# Patient Record
Sex: Male | Born: 1955 | Race: White | Hispanic: No | Marital: Single | Smoking: Former smoker
Health system: Southern US, Community
[De-identification: ages and names within clinical notes are randomized; demographics above are authoritative.]

## PROBLEM LIST (undated history)

## (undated) DIAGNOSIS — J9611 Chronic respiratory failure with hypoxia: Secondary | ICD-10-CM

## (undated) DIAGNOSIS — J449 Chronic obstructive pulmonary disease, unspecified: Secondary | ICD-10-CM

## (undated) DIAGNOSIS — C801 Malignant (primary) neoplasm, unspecified: Secondary | ICD-10-CM

## (undated) DIAGNOSIS — F411 Generalized anxiety disorder: Secondary | ICD-10-CM

## (undated) SURGERY — Surgical Case
Anesthesia: *Unknown

---

## 2007-07-30 ENCOUNTER — Emergency Department: Payer: Self-pay | Admitting: Emergency Medicine

## 2008-02-02 ENCOUNTER — Emergency Department: Payer: Self-pay | Admitting: Emergency Medicine

## 2008-07-27 ENCOUNTER — Encounter: Payer: Self-pay | Admitting: General Practice

## 2008-08-25 ENCOUNTER — Encounter: Payer: Self-pay | Admitting: General Practice

## 2008-10-26 ENCOUNTER — Ambulatory Visit: Payer: Self-pay | Admitting: Family Medicine

## 2010-09-01 ENCOUNTER — Emergency Department: Payer: Self-pay

## 2013-06-04 ENCOUNTER — Emergency Department: Payer: Self-pay | Admitting: Emergency Medicine

## 2021-05-14 ENCOUNTER — Inpatient Hospital Stay
Admission: EM | Admit: 2021-05-14 | Discharge: 2021-05-22 | DRG: 987 | Disposition: A | Discharge status: Home / Self Care | Payer: Non-veteran care | Attending: Internal Medicine | Admitting: Internal Medicine

## 2021-05-14 ENCOUNTER — Emergency Department: Payer: Non-veteran care

## 2021-05-14 ENCOUNTER — Other Ambulatory Visit: Payer: Self-pay

## 2021-05-14 DIAGNOSIS — C771 Secondary and unspecified malignant neoplasm of intrathoracic lymph nodes: Secondary | ICD-10-CM | POA: Diagnosis present

## 2021-05-14 DIAGNOSIS — C3412 Malignant neoplasm of upper lobe, left bronchus or lung: Principal | ICD-10-CM | POA: Diagnosis present

## 2021-05-14 DIAGNOSIS — Z20822 Contact with and (suspected) exposure to covid-19: Secondary | ICD-10-CM | POA: Diagnosis present

## 2021-05-14 DIAGNOSIS — R042 Hemoptysis: Secondary | ICD-10-CM

## 2021-05-14 DIAGNOSIS — E876 Hypokalemia: Secondary | ICD-10-CM | POA: Diagnosis not present

## 2021-05-14 DIAGNOSIS — R0602 Shortness of breath: Secondary | ICD-10-CM | POA: Diagnosis not present

## 2021-05-14 DIAGNOSIS — Z23 Encounter for immunization: Secondary | ICD-10-CM

## 2021-05-14 DIAGNOSIS — Z87891 Personal history of nicotine dependence: Secondary | ICD-10-CM

## 2021-05-14 DIAGNOSIS — F17200 Nicotine dependence, unspecified, uncomplicated: Secondary | ICD-10-CM

## 2021-05-14 DIAGNOSIS — C787 Secondary malignant neoplasm of liver and intrahepatic bile duct: Secondary | ICD-10-CM | POA: Diagnosis present

## 2021-05-14 DIAGNOSIS — J440 Chronic obstructive pulmonary disease with acute lower respiratory infection: Secondary | ICD-10-CM | POA: Diagnosis present

## 2021-05-14 DIAGNOSIS — R918 Other nonspecific abnormal finding of lung field: Secondary | ICD-10-CM

## 2021-05-14 DIAGNOSIS — R160 Hepatomegaly, not elsewhere classified: Secondary | ICD-10-CM

## 2021-05-14 DIAGNOSIS — C3490 Malignant neoplasm of unspecified part of unspecified bronchus or lung: Secondary | ICD-10-CM

## 2021-05-14 DIAGNOSIS — J9601 Acute respiratory failure with hypoxia: Secondary | ICD-10-CM

## 2021-05-14 DIAGNOSIS — D860 Sarcoidosis of lung: Secondary | ICD-10-CM | POA: Diagnosis present

## 2021-05-14 DIAGNOSIS — J189 Pneumonia, unspecified organism: Secondary | ICD-10-CM

## 2021-05-14 DIAGNOSIS — R591 Generalized enlarged lymph nodes: Secondary | ICD-10-CM

## 2021-05-14 LAB — CBC WITH DIFFERENTIAL/PLATELET
Abs Immature Granulocytes: 0.03 10*3/uL (ref 0.00–0.07)
Basophils Absolute: 0 10*3/uL (ref 0.0–0.1)
Basophils Relative: 0 %
Eosinophils Absolute: 0.1 10*3/uL (ref 0.0–0.5)
Eosinophils Relative: 1 %
HCT: 42.6 % (ref 39.0–52.0)
Hemoglobin: 14.4 g/dL (ref 13.0–17.0)
Immature Granulocytes: 0 %
Lymphocytes Relative: 9 %
Lymphs Abs: 0.9 10*3/uL (ref 0.7–4.0)
MCH: 28.9 pg (ref 26.0–34.0)
MCHC: 33.8 g/dL (ref 30.0–36.0)
MCV: 85.4 fL (ref 80.0–100.0)
Monocytes Absolute: 1 10*3/uL (ref 0.1–1.0)
Monocytes Relative: 9 %
Neutro Abs: 8.4 10*3/uL — ABNORMAL HIGH (ref 1.7–7.7)
Neutrophils Relative %: 81 %
Platelets: 185 10*3/uL (ref 150–400)
RBC: 4.99 MIL/uL (ref 4.22–5.81)
RDW: 12.7 % (ref 11.5–15.5)
WBC: 10.5 10*3/uL (ref 4.0–10.5)
nRBC: 0 % (ref 0.0–0.2)

## 2021-05-14 MED ORDER — SODIUM CHLORIDE 0.9 % IV BOLUS
500.0000 mL | Freq: Once | INTRAVENOUS | Status: AC
  Administered 2021-05-15: 500 mL via INTRAVENOUS

## 2021-05-14 MED ORDER — HYDROCOD POLST-CPM POLST ER 10-8 MG/5ML PO SUER
5.0000 mL | Freq: Once | ORAL | Status: AC
  Administered 2021-05-14: 5 mL via ORAL
  Filled 2021-05-14: qty 5

## 2021-05-14 MED ORDER — SODIUM CHLORIDE 0.9 % IV BOLUS: 1000.0000 mL | Freq: Once | INTRAVENOUS | Status: DC

## 2021-05-14 MED ORDER — SODIUM CHLORIDE 0.9 % IV SOLN
500.0000 mg | Freq: Once | INTRAVENOUS | Status: AC
  Administered 2021-05-15: 500 mg via INTRAVENOUS
  Filled 2021-05-14: qty 500

## 2021-05-14 MED ORDER — IPRATROPIUM-ALBUTEROL 0.5-2.5 (3) MG/3ML IN SOLN
3.0000 mL | Freq: Once | RESPIRATORY_TRACT | Status: AC
  Administered 2021-05-14: 3 mL via RESPIRATORY_TRACT
  Filled 2021-05-14: qty 3

## 2021-05-14 MED ORDER — METHYLPREDNISOLONE SODIUM SUCC 125 MG IJ SOLR
125.0000 mg | Freq: Once | INTRAMUSCULAR | Status: AC
  Administered 2021-05-15: 125 mg via INTRAVENOUS
  Filled 2021-05-14: qty 2

## 2021-05-14 MED ORDER — SODIUM CHLORIDE 0.9 % IV SOLN
1.0000 g | Freq: Once | INTRAVENOUS | Status: AC
  Administered 2021-05-15: 1 g via INTRAVENOUS
  Filled 2021-05-14: qty 10

## 2021-05-14 NOTE — ED Notes (Signed)
Patient placed on 02 at 2 liters via nasal cannula.

## 2021-05-14 NOTE — ED Provider Notes (Signed)
[facility] Emergency Department Provider Note   ____________________________________________   Event Date/Time   First MD Initiated Contact with Patient 05/14/21 2304     (approximate)  I have reviewed the triage vital signs and the nursing notes.   HISTORY  Chief Complaint Shortness of Breath    HPI Derek Clarke is a 65 y.o. male who presents to the ED from home with a chief complaint of cough and shortness of breath.  Symptoms x2 months, worsening over the past several days and acutely worse tonight.  Endorses hemoptysis.  Denies medical history, does not take prescription medications. + Smoker.  Denies fever, chest pain, abdominal pain, nausea, vomiting or dizziness.  Denies recent travel or trauma.     Past medical history None  There are no problems to display for this patient.   Prior to Admission medications   Not on File    Allergies Patient has no known allergies.  No family history on file.  Social History    Review of Systems  Constitutional: No fever/chills Eyes: No visual changes. ENT: No sore throat. Cardiovascular: Denies chest pain. Respiratory: Positive for cough, hemoptysis and shortness of breath. Gastrointestinal: No abdominal pain.  No nausea, no vomiting.  No diarrhea.  No constipation. Genitourinary: Negative for dysuria. Musculoskeletal: Negative for back pain. Skin: Negative for rash. Neurological: Negative for headaches, focal weakness or numbness.   ____________________________________________   PHYSICAL EXAM:  VITAL SIGNS: ED Triage Vitals  Enc Vitals Group     BP 05/14/21 2247 (!) 156/91     Pulse Rate 05/14/21 2247 100     Resp 05/14/21 2247 (!) 22     Temp 05/14/21 2247 98.7 F (37.1 C)     Temp Source 05/14/21 2247 Oral     SpO2 05/14/21 2247 90 %     Weight 05/14/21 2243 225 lb (102.1 kg)     Height 05/14/21 2243 5\' 9"  (1.753 m)     Head Circumference --      Peak Flow --      Pain  Score --      Pain Loc --      Pain Edu? --      Excl. in [place]? --     Constitutional: Alert and oriented.  Ill appearing and in moderate acute distress. Eyes: Conjunctivae are normal. PERRL. EOMI. Head: Atraumatic. Nose: No congestion/rhinnorhea. Mouth/Throat: Mucous membranes are mildly dry. Neck: No stridor.   Cardiovascular: Normal rate, regular rhythm. Grossly normal heart sounds.  Good peripheral circulation. Respiratory: Increased respiratory effort.  Mild retractions.  Tripoding.  Lungs diminished with scattered wheezing and rhonchi. Gastrointestinal: Soft and nontender to light or deep palpation. No distention. No abdominal bruits. No CVA tenderness. Musculoskeletal: No lower extremity tenderness nor edema.  No joint effusions. Neurologic:  Normal speech and language. No gross focal neurologic deficits are appreciated.  Skin:  Skin is warm, dry and intact. No rash noted. Psychiatric: Mood and affect are normal. Speech and behavior are normal.  ____________________________________________   LABS (all labs ordered are listed, but only abnormal results are displayed)  Labs Reviewed  CBC WITH DIFFERENTIAL/PLATELET - Abnormal; Notable for the following components:      Result Value   Neutro Abs 8.4 (*)    All other components within normal limits  COMPREHENSIVE METABOLIC PANEL - Abnormal; Notable for the following components:   Potassium 3.4 (*)    Glucose, Bld 108 (*)    Creatinine, Ser 0.58 (*)  All other components within normal limits  BLOOD GAS, ARTERIAL - Abnormal; Notable for the following components:   pH, Arterial 7.51 (*)    pO2, Arterial 72 (*)    Bicarbonate 31.9 (*)    Acid-Base Excess 8.1 (*)    All other components within normal limits  RESP PANEL BY RT-PCR (FLU A&B, COVID) [id number]  CULTURE, BLOOD (ROUTINE X 2)  CULTURE, BLOOD (ROUTINE X 2)  LACTIC ACID, PLASMA  PROCALCITONIN  TROPONIN I (HIGH SENSITIVITY)  TROPONIN I (HIGH SENSITIVITY)    ____________________________________________  EKG  ED ECG REPORT I, Fernanda Twaddell J, the attending physician, personally viewed and interpreted this ECG.   Date: 05/14/2021  EKG Time: 2301  Rate: 91  Rhythm: normal sinus rhythm  Axis: Normal  Intervals:right bundle branch block  ST&T Change: Nonspecific  ____________________________________________  RADIOLOGY Cecilie Kicks J, personally viewed and evaluated these images (plain radiographs) as part of my medical decision making, as well as reviewing the written report by the radiologist.  ED MD interpretation:  bilateral perihilar infiltrates  Official radiology report(s): DG Chest 1 View  Result Date: 05/14/2021 CLINICAL DATA:  Shortness of breath for couple of months. EXAM: CHEST  1 VIEW COMPARISON:  None. FINDINGS: Mild cardiac enlargement. Perihilar infiltrates bilaterally may represent edema or multifocal pneumonia. No pleural effusions. No pneumothorax. Mediastinal contours appear intact. IMPRESSION: Bilateral perihilar infiltrates. Electronically Signed   By: Lucienne Capers M.D.   On: 05/14/2021 23:11   CT Angio Chest PE W/Cm &/Or Wo Cm  Result Date: 05/15/2021 CLINICAL DATA:  Hemoptysis EXAM: CT ANGIOGRAPHY CHEST WITH CONTRAST TECHNIQUE: Multidetector CT imaging of the chest was performed using the standard protocol during bolus administration of intravenous contrast. Multiplanar CT image reconstructions and MIPs were obtained to evaluate the vascular anatomy. CONTRAST:  136mL OMNIPAQUE IOHEXOL 350 MG/ML SOLN COMPARISON:  None. FINDINGS: Cardiovascular: Contrast injection is sufficient to demonstrate satisfactory opacification of the pulmonary arteries to the segmental level. There is no pulmonary embolus or evidence of right heart strain. The size of the main pulmonary artery is normal. Proximal left pulmonary arteries are severely narrowed by confluent soft tissue surrounding the left hilum. The course and caliber of the aorta  are normal. There is atherosclerotic calcification. Opacification decreased due to pulmonary arterial phase contrast bolus timing. There is a small pericardial effusion. Mediastinum/Nodes: There is bulky abnormal mediastinal soft tissue with in case mint of the left common carotid artery and proximal left subclavian artery. There are multiple enlarged mediastinal lymph nodes. Level 4 are node measures 10 mm. Lungs/Pleura: There is confluent soft tissue at the left hilum encasing the main left pulmonary artery and severely narrowing the proximal lobar branches. Abnormal soft tissue extends in a peribronchial pattern. There is also abnormal soft tissue at the right hilum. There is atelectasis within the right middle lobe and an area of consolidation in the lingula. Small pleural effusions. Upper Abdomen: Contrast bolus timing is not optimized for evaluation of the abdominal organs. There is an intermediate density 2.0 x 1.3 cm right adrenal nodule. There is a lower density left adrenal nodule measuring 1.5 cm. Musculoskeletal: No chest wall abnormality. No bony spinal canal stenosis. Review of the MIP images confirms the above findings. IMPRESSION: 1. No pulmonary embolus or acute aortic syndrome. 2. Bulky abnormal soft tissue at the left hilum encasing the main left pulmonary artery and severely narrowing the proximal lobar branches, consistent with malignancy. 3. Multiple enlarged mediastinal lymph nodes, consistent with lymphatic metastatic disease. 4. Bilateral adrenal  nodules, measuring up to 2.0 x 1.3 cm. The right adrenal nodule is concerning for metastatic disease. The left may be an adenoma. 5. Small pericardial effusion and small pleural effusions. 6. Consolidation in the lingula may be postobstructive. Aortic atherosclerosis (ICD10-I70.0). Electronically Signed   By: Ulyses Jarred M.D.   On: 05/15/2021 01:57    ____________________________________________   PROCEDURES  Procedure(s) performed  (including Critical Care):  .1-3 Lead EKG Interpretation  Date/Time: 05/14/2021 11:43 PM Performed by: Paulette Blanch, MD Authorized by: Paulette Blanch, MD     Interpretation: normal     ECG rate:  96   ECG rate assessment: normal     Rhythm: sinus rhythm     Ectopy: none     Conduction: normal   Comments:     Patient placed on cardiac monitor to evaluate for arrhythmias  CRITICAL CARE Performed by: Paulette Blanch   Total critical care time: 45 minutes  Critical care time was exclusive of separately billable procedures and treating other patients.  Critical care was necessary to treat or prevent imminent or life-threatening deterioration.  Critical care was time spent personally by me on the following activities: development of treatment plan with patient and/or surrogate as well as nursing, discussions with consultants, evaluation of patient's response to treatment, examination of patient, obtaining history from patient or surrogate, ordering and performing treatments and interventions, ordering and review of laboratory studies, ordering and review of radiographic studies, pulse oximetry and re-evaluation of patient's condition.    ____________________________________________   INITIAL IMPRESSION / ASSESSMENT AND PLAN / ED COURSE  As part of my medical decision making, I reviewed the following data within the [facility] notes reviewed and incorporated, Labs reviewed, EKG interpreted, Old chart reviewed, Radiograph reviewed, Discussed with admitting physician, and Notes from prior ED visits     65 year old male presenting with cough, hemoptysis and shortness of breath. Differential includes, but is not limited to, viral syndrome, bronchitis including COPD exacerbation, pneumonia, reactive airway disease including asthma, CHF including exacerbation with or without pulmonary/interstitial edema, pneumothorax, ACS, thoracic trauma, and pulmonary embolism.   Will  obtain cardiac panel, blood cultures, lactic acid, procalcitonin, COVID swab.  Chest x-ray demonstrates perihilar infiltrates.  Will initiate IV antibiotics for community-acquired pneumonia.  Will obtain CTA chest to evaluate for PE given complaint of hemoptysis.  Patient placed on 2 L nasal cannula oxygen for room air saturations of 90%; now 96%.  Given his tachypnea and retractions, will administer IV Solu-Medrol and DuoNeb.  Anticipate hospitalization.  Clinical Course as of 05/15/21 0236  Nancy Fetter May 15, 2021  0059 Patient feels like there is something stuck in his throat that he cannot cough up.  IV Rocephin completed.  There are no hives, no oropharyngeal swelling.  There is no muffled or hoarse voice.  Patient is coughing, feeling agitated, better on standing.  Will administer second DuoNeb, low-dose Benadryl. [JS]  0104 Patient is already feeling better with the initiation of the second DuoNeb. [JS]  [id number] Updated patient on CT imaging results.  Will discuss with hospitalist services for admission. [JS]    Clinical Course User Index [JS] Paulette Blanch, MD     ____________________________________________   FINAL CLINICAL IMPRESSION(S) / ED DIAGNOSES  Final diagnoses:  Community acquired pneumonia, unspecified laterality  Shortness of breath  Hemoptysis  Malignant neoplasm of lung, unspecified laterality, unspecified part of lung [facility])     ED Discharge Orders     None  Note:  This document was prepared using Dragon voice recognition software and may include unintentional dictation errors.    Paulette Blanch, MD 05/15/21 [phone]

## 2021-05-14 NOTE — ED Triage Notes (Signed)
Patient reports feeling short of breath for couple months, worse tonight.

## 2021-05-14 NOTE — ED Notes (Signed)
ED Provider at bedside. 

## 2021-05-15 ENCOUNTER — Inpatient Hospital Stay: Payer: Non-veteran care

## 2021-05-15 ENCOUNTER — Encounter: Payer: Self-pay | Admitting: Internal Medicine

## 2021-05-15 ENCOUNTER — Emergency Department: Payer: Non-veteran care

## 2021-05-15 DIAGNOSIS — R0602 Shortness of breath: Secondary | ICD-10-CM

## 2021-05-15 DIAGNOSIS — R042 Hemoptysis: Secondary | ICD-10-CM

## 2021-05-15 DIAGNOSIS — C787 Secondary malignant neoplasm of liver and intrahepatic bile duct: Secondary | ICD-10-CM | POA: Diagnosis present

## 2021-05-15 DIAGNOSIS — Z23 Encounter for immunization: Secondary | ICD-10-CM | POA: Diagnosis not present

## 2021-05-15 DIAGNOSIS — J440 Chronic obstructive pulmonary disease with acute lower respiratory infection: Secondary | ICD-10-CM | POA: Diagnosis present

## 2021-05-15 DIAGNOSIS — Z87891 Personal history of nicotine dependence: Secondary | ICD-10-CM | POA: Diagnosis not present

## 2021-05-15 DIAGNOSIS — F17200 Nicotine dependence, unspecified, uncomplicated: Secondary | ICD-10-CM

## 2021-05-15 DIAGNOSIS — C3490 Malignant neoplasm of unspecified part of unspecified bronchus or lung: Secondary | ICD-10-CM

## 2021-05-15 DIAGNOSIS — C771 Secondary and unspecified malignant neoplasm of intrathoracic lymph nodes: Secondary | ICD-10-CM | POA: Diagnosis present

## 2021-05-15 DIAGNOSIS — J9601 Acute respiratory failure with hypoxia: Secondary | ICD-10-CM | POA: Diagnosis present

## 2021-05-15 DIAGNOSIS — F17210 Nicotine dependence, cigarettes, uncomplicated: Secondary | ICD-10-CM

## 2021-05-15 DIAGNOSIS — D860 Sarcoidosis of lung: Secondary | ICD-10-CM | POA: Diagnosis present

## 2021-05-15 DIAGNOSIS — J189 Pneumonia, unspecified organism: Secondary | ICD-10-CM

## 2021-05-15 DIAGNOSIS — E876 Hypokalemia: Secondary | ICD-10-CM | POA: Diagnosis not present

## 2021-05-15 DIAGNOSIS — C3412 Malignant neoplasm of upper lobe, left bronchus or lung: Secondary | ICD-10-CM | POA: Diagnosis present

## 2021-05-15 DIAGNOSIS — R918 Other nonspecific abnormal finding of lung field: Secondary | ICD-10-CM

## 2021-05-15 DIAGNOSIS — Z20822 Contact with and (suspected) exposure to covid-19: Secondary | ICD-10-CM | POA: Diagnosis present

## 2021-05-15 LAB — COMPREHENSIVE METABOLIC PANEL
ALT: 33 U/L (ref 0–44)
AST: 30 U/L (ref 15–41)
Albumin: 3.7 g/dL (ref 3.5–5.0)
Alkaline Phosphatase: 100 U/L (ref 38–126)
Anion gap: 10 (ref 5–15)
BUN: 12 mg/dL (ref 8–23)
CO2: 29 mmol/L (ref 22–32)
Calcium: 9 mg/dL (ref 8.9–10.3)
Chloride: 99 mmol/L (ref 98–111)
Creatinine, Ser: 0.58 mg/dL — ABNORMAL LOW (ref 0.61–1.24)
GFR, Estimated: >60 mL/min (ref >60)
Glucose, Bld: 108 mg/dL — ABNORMAL HIGH (ref 70–99)
Potassium: 3.4 mmol/L — ABNORMAL LOW (ref 3.5–5.1)
Sodium: 138 mmol/L (ref 135–145)
Total Bilirubin: 1.1 mg/dL (ref 0.3–1.2)
Total Protein: 6.7 g/dL (ref 6.5–8.1)

## 2021-05-15 LAB — BLOOD GAS, ARTERIAL
Acid-Base Excess: 8.1 mmol/L — ABNORMAL HIGH (ref 0.0–2.0)
Bicarbonate: 31.9 mmol/L — ABNORMAL HIGH (ref 20.0–28.0)
FIO2: 32
O2 Saturation: 95.7 %
Patient temperature: 37
pCO2 arterial: 40 mmHg (ref 32.0–48.0)
pH, Arterial: 7.51 — ABNORMAL HIGH (ref 7.350–7.450)
pO2, Arterial: 72 mmHg — ABNORMAL LOW (ref 83.0–108.0)

## 2021-05-15 LAB — RESP PANEL BY RT-PCR (FLU A&B, COVID) ARPGX2
Influenza A by PCR: NEGATIVE
Influenza B by PCR: NEGATIVE
SARS Coronavirus 2 by RT PCR: NEGATIVE

## 2021-05-15 LAB — LACTIC ACID, PLASMA: Lactic Acid, Venous: 1.1 mmol/L (ref 0.5–1.9)

## 2021-05-15 LAB — HIV ANTIBODY (ROUTINE TESTING W REFLEX): HIV Screen 4th Generation wRfx: NONREACTIVE

## 2021-05-15 LAB — TROPONIN I (HIGH SENSITIVITY)
Troponin I (High Sensitivity): 8 ng/L (ref <18)
Troponin I (High Sensitivity): 8 ng/L (ref <18)

## 2021-05-15 LAB — PROCALCITONIN: Procalcitonin: <0.1 ng/mL

## 2021-05-15 MED ORDER — IPRATROPIUM-ALBUTEROL 0.5-2.5 (3) MG/3ML IN SOLN
RESPIRATORY_TRACT | Status: AC
  Filled 2021-05-15: qty 3

## 2021-05-15 MED ORDER — SODIUM CHLORIDE 0.9 % IV SOLN
1.0000 g | Freq: Once | INTRAVENOUS | Status: AC
  Administered 2021-05-15: 1 g via INTRAVENOUS
  Filled 2021-05-15: qty 10

## 2021-05-15 MED ORDER — METHYLPREDNISOLONE SODIUM SUCC 125 MG IJ SOLR
INTRAMUSCULAR | Status: AC
  Filled 2021-05-15: qty 2

## 2021-05-15 MED ORDER — IOHEXOL 350 MG/ML SOLN
80.0000 mL | Freq: Once | INTRAVENOUS | Status: AC | PRN
  Administered 2021-05-15: 80 mL via INTRAVENOUS
  Filled 2021-05-15: qty 80

## 2021-05-15 MED ORDER — ALBUTEROL SULFATE (2.5 MG/3ML) 0.083% IN NEBU
2.5000 mg | INHALATION_SOLUTION | RESPIRATORY_TRACT | Status: DC | PRN
  Administered 2021-05-15: 2.5 mg via RESPIRATORY_TRACT
  Filled 2021-05-15 (×2): qty 3

## 2021-05-15 MED ORDER — DIPHENHYDRAMINE HCL 50 MG/ML IJ SOLN
INTRAMUSCULAR | Status: AC
  Administered 2021-05-15: 12.5 mg via INTRAVENOUS
  Filled 2021-05-15: qty 1

## 2021-05-15 MED ORDER — NICOTINE 7 MG/24HR TD PT24
7.0000 mg | MEDICATED_PATCH | Freq: Every day | TRANSDERMAL | Status: DC
  Filled 2021-05-15: qty 1

## 2021-05-15 MED ORDER — SODIUM CHLORIDE 0.9 % IV SOLN: 500.0000 mg | INTRAVENOUS | Status: DC

## 2021-05-15 MED ORDER — PNEUMOCOCCAL VAC POLYVALENT 25 MCG/0.5ML IJ INJ
0.5000 mL | INJECTION | INTRAMUSCULAR | Status: AC
  Administered 2021-05-16: 0.5 mL via INTRAMUSCULAR
  Filled 2021-05-15: qty 0.5

## 2021-05-15 MED ORDER — IPRATROPIUM-ALBUTEROL 0.5-2.5 (3) MG/3ML IN SOLN
3.0000 mL | Freq: Once | RESPIRATORY_TRACT | Status: AC
  Administered 2021-05-15: 3 mL via RESPIRATORY_TRACT

## 2021-05-15 MED ORDER — IOHEXOL 350 MG/ML SOLN
100.0000 mL | Freq: Once | INTRAVENOUS | Status: AC | PRN
  Administered 2021-05-15: 100 mL via INTRAVENOUS

## 2021-05-15 MED ORDER — GUAIFENESIN ER 600 MG PO TB12
600.0000 mg | ORAL_TABLET | Freq: Two times a day (BID) | ORAL | Status: DC
  Administered 2021-05-15 – 2021-05-22 (×15): 600 mg via ORAL
  Filled 2021-05-15 (×16): qty 1

## 2021-05-15 MED ORDER — IPRATROPIUM-ALBUTEROL 0.5-2.5 (3) MG/3ML IN SOLN
3.0000 mL | Freq: Three times a day (TID) | RESPIRATORY_TRACT | Status: DC
  Administered 2021-05-15 – 2021-05-16 (×3): 3 mL via RESPIRATORY_TRACT
  Filled 2021-05-15 (×3): qty 3

## 2021-05-15 MED ORDER — SODIUM CHLORIDE 0.9 % IV SOLN: 2.0000 g | INTRAVENOUS | Status: DC

## 2021-05-15 MED ORDER — DIPHENHYDRAMINE HCL 50 MG/ML IJ SOLN: 12.5000 mg | Freq: Once | INTRAMUSCULAR | Status: AC

## 2021-05-15 MED ORDER — IPRATROPIUM-ALBUTEROL 0.5-2.5 (3) MG/3ML IN SOLN
3.0000 mL | Freq: Once | RESPIRATORY_TRACT | Status: AC
  Administered 2021-05-15: 3 mL via RESPIRATORY_TRACT
  Filled 2021-05-15: qty 3

## 2021-05-15 NOTE — Progress Notes (Addendum)
PROGRESS NOTE    Derek Clarke   OZH:[id number]  DOB: 22-Sep-1956  DOA: 05/14/2021 PCP: Merryl Hacker, No   Brief Narrative:  Derek Clarke is a 65 year old male who is a smoker and has no PCP.  The patient states that he has had a cough and shortness of breath for 2-3 months and noticed that he was coughing up blood 3 to 4 days ago.  He is not able to lay flat due to shortness of breath and also states that he is short of breath while sitting up. CTA chest: Bulky abnormal soft tissue in the left hilum encasing the main left pulmonary artery and severely narrowing of the proximal lobar branches. 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 Bilateral adrenal nodules measuring 2 x 1.3 cm.  The right nodule is concerning for metastatic disease.  The left nodule may be an adenoma.  Small pericardial and small pleural effusions.  Consolidation in the lingula may be postobstructive.r   He admits to smoking 1 pack/day but states that he stopped smoking about a month ago.  He has been taking aspirin for chest pain up to 2 or 3 a day and last took an aspirin yesterday.  He is admitted for further work-up   Subjective: He is not able to lay flat due to shortness of breath and also states that he is short of breath while sitting up. He continues to have hemoptysis today mixed with clear sputum.    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 Possible post obstructive PNA -Respiratory rate remains elevated in the 20s at rest, heart rate is going up above 100 at times -The mass is encasing the main left pulmonary artery and he is high risk for massive hemoptysis - bleeding is likely exacerbated by his Aspirin use (was using upto 3 x a day) - We will be holding his aspirin - I have contacted pulmonary for bronchoscopy and the plan is for bronchoscopy early this week -We will order staging work-up  including an MRI of the brain and a CT of the abdomen pelvis - will resume Ceftriaxone for possible post obstructive PNA- will check a MRSA PCR to see if Vanc is needed  Active Problems:    Acute respiratory failure with hypoxia  -His pulse ox is 88% on room air at rest - Continue oxygen to keep pulse ox in the 90s  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    Nicotine dependence -He admits that he was smoking up to 1 pack/day for many years and states he stopped about 1 month ago  NOTE: per pulmonary, if large volume hemoptysis occurs: Place patient LEFT side down               - Right mainstem intubation               - Call IR/Vascular for pulmonary artery embolization               - Call PCCM for escalation of care  Time spent in minutes: 35 DVT prophylaxis: SCDs Start: 05/15/21 0308  Code Status: full  Family Communication:  Level of Care: Level of care: Med-Surg Disposition Plan:  Status is: Inpatient  Remains inpatient appropriate because:Inpatient level of care appropriate due to severity of illness  Dispo: The patient is from: Home              Anticipated  d/c is to: Home              Patient currently is not medically stable to d/c.   Difficult to place patient Yes      Consultants:  pulmonary Procedures:  none Antimicrobials:  Anti-infectives (From admission, onward)    Start     Dose/Rate Route Frequency Ordered Stop   05/15/21 0315  cefTRIAXone (ROCEPHIN) 2 g in sodium chloride 0.9 % 100 mL IVPB  Status:  Discontinued        2 g 200 mL/hr over 30 Minutes Intravenous Every 24 hours 05/15/21 0309 05/15/21 0314   05/15/21 0315  azithromycin (ZITHROMAX) 500 mg in sodium chloride 0.9 % 250 mL IVPB  Status:  Discontinued        500 mg 250 mL/hr over 60 Minutes Intravenous Every 24 hours 05/15/21 0309 05/15/21 0314   05/14/21 2330  cefTRIAXone (ROCEPHIN) 1 g in sodium chloride 0.9 % 100 mL IVPB        1 g 200 mL/hr over 30 Minutes Intravenous  Once 05/14/21  2329 05/15/21 0033   05/14/21 2330  azithromycin (ZITHROMAX) 500 mg in sodium chloride 0.9 % 250 mL IVPB        500 mg 250 mL/hr over 60 Minutes Intravenous  Once 05/14/21 2329 05/15/21 0153        Objective: Vitals:   05/15/21 0719 05/15/21 1002 05/15/21 1245 05/15/21 1245  BP: (!) 160/90 (!) 123/94  (!) 151/88  Pulse: 88 (!) 107 96   Resp: 20 19 (!) 24   Temp:      TempSrc:      SpO2: 92% 93% 91%   Weight:      Height:       No intake or output data in the 24 hours ending 05/15/21 1248 Filed Weights   05/14/21 2243  Weight: 102.1 kg    Examination: General exam: Appears anxious - coughing HEENT: PERRLA, oral mucosa moist, no sclera icterus or thrush Respiratory system: Clear to auscultation. Respiratory effort normal. Cardiovascular system: S1 & S2 heard, RRR.   Gastrointestinal system: Abdomen soft, non-tender, nondistended. Normal bowel sounds. Central nervous system: Alert and oriented. No focal neurological deficits. Extremities: No cyanosis, clubbing or edema Skin: No rashes or ulcers Psychiatry:  anxious     Data Reviewed: I have personally reviewed following labs and imaging studies  CBC: Recent Labs  Lab 05/14/21 2306  WBC 10.5  NEUTROABS 8.4*  HGB 14.4  HCT 42.6  MCV 85.4  PLT 440   Basic Metabolic Panel: Recent Labs  Lab 05/14/21 2306  NA 138  K 3.4*  CL 99  CO2 29  GLUCOSE 108*  BUN 12  CREATININE 0.58*  CALCIUM 9.0   GFR: Estimated Creatinine Clearance: 109.9 mL/min (A) (by C-G formula based on SCr of 0.58 mg/dL (L)). Liver Function Tests: Recent Labs  Lab 05/14/21 2306  AST 30  ALT 33  ALKPHOS 100  BILITOT 1.1  PROT 6.7  ALBUMIN 3.7   No results for input(s): LIPASE, AMYLASE in the last 168 hours. No results for input(s): AMMONIA in the last 168 hours. Coagulation Profile: No results for input(s): INR, PROTIME in the last 168 hours. Cardiac Enzymes: No results for input(s): CKTOTAL, CKMB, CKMBINDEX, TROPONINI in the  last 168 hours. BNP (last 3 results) No results for input(s): PROBNP in the last 8760 hours. HbA1C: No results for input(s): HGBA1C in the last 72 hours. CBG: No results for input(s): GLUCAP in the last 168  hours. Lipid Profile: No results for input(s): CHOL, HDL, LDLCALC, TRIG, CHOLHDL, LDLDIRECT in the last 72 hours. Thyroid Function Tests: No results for input(s): TSH, T4TOTAL, FREET4, T3FREE, THYROIDAB in the last 72 hours. Anemia Panel: No results for input(s): VITAMINB12, FOLATE, FERRITIN, TIBC, IRON, RETICCTPCT in the last 72 hours. Urine analysis: No results found for: COLORURINE, APPEARANCEUR, LABSPEC, PHURINE, GLUCOSEU, HGBUR, BILIRUBINUR, KETONESUR, PROTEINUR, UROBILINOGEN, NITRITE, LEUKOCYTESUR Sepsis Labs: @LABRCNTIP (procalcitonin:4,lacticidven:4) ) Recent Results (from the past 240 hour(s))  Resp Panel by RT-PCR (Flu A&B, Covid) Nasopharyngeal Swab     Status: None   Collection Time: 05/14/21 11:06 PM   Specimen: Nasopharyngeal Swab; Nasopharyngeal(NP) swabs in vial transport medium  Result Value Ref Range Status   SARS Coronavirus 2 by RT PCR NEGATIVE NEGATIVE Final    Comment: (NOTE) SARS-CoV-2 target nucleic acids are NOT DETECTED.  The SARS-CoV-2 RNA is generally detectable in upper respiratory specimens during the acute phase of infection. The lowest concentration of SARS-CoV-2 viral copies this assay can detect is 138 copies/mL. A negative result does not preclude SARS-Cov-2 infection and should not be used as the sole basis for treatment or other patient management decisions. A negative result may occur with  improper specimen collection/handling, submission of specimen other than nasopharyngeal swab, presence of viral mutation(s) within the areas targeted by this assay, and inadequate number of viral copies(<138 copies/mL). A negative result must be combined with clinical observations, patient history, and epidemiological information. The expected result is  Negative.  Fact Sheet for Patients:  EntrepreneurPulse.com.au  Fact Sheet for Healthcare Providers:  IncredibleEmployment.be  This test is no t yet approved or cleared by the [place] FDA and  has been authorized for detection and/or diagnosis of SARS-CoV-2 by FDA under an Emergency Use Authorization (EUA). This EUA will remain  in effect (meaning this test can be used) for the duration of the COVID-19 declaration under Section 564(b)(1) of the Act, 21 U.S.C.section 360bbb-3(b)(1), unless the authorization is terminated  or revoked sooner.       Influenza A by PCR NEGATIVE NEGATIVE Final   Influenza B by PCR NEGATIVE NEGATIVE Final    Comment: (NOTE) The Xpert Xpress SARS-CoV-2/FLU/RSV plus assay is intended as an aid in the diagnosis of influenza from Nasopharyngeal swab specimens and should not be used as a sole basis for treatment. Nasal washings and aspirates are unacceptable for Xpert Xpress SARS-CoV-2/FLU/RSV testing.  Fact Sheet for Patients: EntrepreneurPulse.com.au  Fact Sheet for Healthcare Providers: IncredibleEmployment.be  This test is not yet approved or cleared by the [place] FDA and has been authorized for detection and/or diagnosis of SARS-CoV-2 by FDA under an Emergency Use Authorization (EUA). This EUA will remain in effect (meaning this test can be used) for the duration of the COVID-19 declaration under Section 564(b)(1) of the Act, 21 U.S.C. section 360bbb-3(b)(1), unless the authorization is terminated or revoked.  Performed at [facility], [address] [place], [place] [address]   Culture, blood (routine x 2)     Status: None (Preliminary result)   Collection Time: 05/15/21 12:07 AM   Specimen: BLOOD  Result Value Ref Range Status   Specimen Description BLOOD RIGHT FOREARM  Final   Special Requests   Final    BOTTLES DRAWN AEROBIC AND ANAEROBIC Blood  Culture results may not be optimal due to an excessive volume of blood received in culture bottles   Culture   Final    NO GROWTH < 12 HOURS Performed at [facility], [address] [place], [place] [address]    Report Status PENDING  Incomplete  Culture, blood (routine x 2)     Status: None (Preliminary result)   Collection Time: 05/15/21 12:07 AM   Specimen: BLOOD  Result Value Ref Range Status   Specimen Description BLOOD LEFT FOREARM  Final   Special Requests   Final    BOTTLES DRAWN AEROBIC AND ANAEROBIC Blood Culture results may not be optimal due to an excessive volume of blood received in culture bottles   Culture   Final    NO GROWTH < 12 HOURS Performed at [facility], [address], [place],  [address]    Report Status PENDING  Incomplete         Radiology Studies: DG Chest 1 View  Result Date: 05/14/2021 CLINICAL DATA:  Shortness of breath for couple of months. EXAM: CHEST  1 VIEW COMPARISON:  None. FINDINGS: Mild cardiac enlargement. Perihilar infiltrates bilaterally may represent edema or multifocal pneumonia. No pleural effusions. No pneumothorax. Mediastinal contours appear intact. IMPRESSION: Bilateral perihilar infiltrates. Electronically Signed   By: Lucienne Capers M.D.   On: 05/14/2021 23:11   CT Angio Chest PE W/Cm &/Or Wo Cm  Result Date: 05/15/2021 CLINICAL DATA:  Hemoptysis EXAM: CT ANGIOGRAPHY CHEST WITH CONTRAST TECHNIQUE: Multidetector CT imaging of the chest was performed using the standard protocol during bolus administration of intravenous contrast. Multiplanar CT image reconstructions and MIPs were obtained to evaluate the vascular anatomy. CONTRAST:  153mL OMNIPAQUE IOHEXOL 350 MG/ML SOLN COMPARISON:  None. FINDINGS: Cardiovascular: Contrast injection is sufficient to demonstrate satisfactory opacification of the pulmonary arteries to the segmental level. There is no pulmonary embolus or evidence of right heart strain.  The size of the main pulmonary artery is normal. Proximal left pulmonary arteries are severely narrowed by confluent soft tissue surrounding the left hilum. The course and caliber of the aorta are normal. There is atherosclerotic calcification. Opacification decreased due to pulmonary arterial phase contrast bolus timing. There is a small pericardial effusion. Mediastinum/Nodes: There is bulky abnormal mediastinal soft tissue with in case mint of the left common carotid artery and proximal left subclavian artery. There are multiple enlarged mediastinal lymph nodes. Level 4 are node measures 10 mm. Lungs/Pleura: There is confluent soft tissue at the left hilum encasing the main left pulmonary artery and severely narrowing the proximal lobar branches. Abnormal soft tissue extends in a peribronchial pattern. There is also abnormal soft tissue at the right hilum. There is atelectasis within the right middle lobe and an area of consolidation in the lingula. Small pleural effusions. Upper Abdomen: Contrast bolus timing is not optimized for evaluation of the abdominal organs. There is an intermediate density 2.0 x 1.3 cm right adrenal nodule. There is a lower density left adrenal nodule measuring 1.5 cm. Musculoskeletal: No chest wall abnormality. No bony spinal canal stenosis. Review of the MIP images confirms the above findings. IMPRESSION: 1. No pulmonary embolus or acute aortic syndrome. 2. Bulky abnormal soft tissue at the left hilum encasing the main left pulmonary artery and severely narrowing the proximal lobar branches, consistent with malignancy. 3. Multiple enlarged mediastinal lymph nodes, consistent with lymphatic metastatic disease. 4. Bilateral adrenal nodules, measuring up to 2.0 x 1.3 cm. The right adrenal nodule is concerning for metastatic disease. The left may be an adenoma. 5. Small pericardial effusion and small pleural effusions. 6. Consolidation in the lingula may be postobstructive. Aortic  atherosclerosis (ICD10-I70.0). Electronically Signed   By: Cletus Gash.D.  On: 05/15/2021 01:57      Scheduled Meds:  guaiFENesin  600 mg Oral BID   ipratropium-albuterol  3 mL Nebulization TID   nicotine  7 mg Transdermal Daily   Continuous Infusions:   LOS: 0 days      Debbe Odea, MD [facility] Pager: [email or web address] 05/15/2021, 12:48 PM

## 2021-05-15 NOTE — Evaluation (Signed)
Occupational Therapy Evaluation Patient Details Name: Derek Clarke MRN: [id number] DOB: [birth year] Today's Date: 05/15/2021    History of Present Illness Pt is a 65 y/o M with PMH: smoking. He presented d/t concerns for hemoptysis. Pt endorses SOB and cough that has been occuring for ~2 months. CT scan c/w lung mass and mets to lymphnodes.   Clinical Impression   Pt seen for OT evaluation this date in setting of acute hospitalization d/t SOB. Pt reports living alone in mobile home with 3 STE and being INDEP at baseline. Pt does present with some decreased fxl activity tolerance this date 2/2 increased O2 needs and increased WOB. Pt's strength is WFL and pt is able to complete all self care and transfers at MOD I level (increased time d/t decreased activity tolerance). Pt demos ability to complete safe and steady fxl mobility to/from restroom with no AD. OT educates pt re: swapping nasal cannula tubing from wall to portable tank and pt demos good understanding. No further acute OT needs, nor need for f/u detected.  Do anticipate pt will require family/community supports r/t to IADLs such as meal preparation d/t his decreased tolerance.     Follow Up Recommendations  No OT follow up    Equipment Recommendations  None recommended by OT    Recommendations for Other Services       Precautions / Restrictions Precautions Precautions: Fall Restrictions Weight Bearing Restrictions: No      Mobility Bed Mobility Overal bed mobility: Independent                  Transfers Overall transfer level: Modified independent               General transfer comment: increased time, but no physical assist or use of AD requires. OT educates pt re: how to attach nasal cannula tubing to tank for toileting    Balance Overall balance assessment: Independent                                         ADL either performed or assessed with clinical judgement   ADL Overall  ADL's : Modified independent                                       General ADL Comments: requires increased time and O2 tank which he does not at baseline     Vision Baseline Vision/History: Wears glasses Wears Glasses: At all times Patient Visual Report: No change from baseline       Perception     Praxis      Pertinent Vitals/Pain Pain Assessment: Faces Faces Pain Scale: No hurt     Hand Dominance     Extremity/Trunk Assessment Upper Extremity Assessment Upper Extremity Assessment: Overall WFL for tasks assessed   Lower Extremity Assessment Lower Extremity Assessment: Overall WFL for tasks assessed       Communication Communication Communication:  (choppy communication, potentially d/t SOB)   Cognition Arousal/Alertness: Awake/alert Behavior During Therapy: WFL for tasks assessed/performed Overall Cognitive Status: Within Functional Limits for tasks assessed                                 General Comments: he is oriented and appropriate, some  choppy communication, difficult to discern if this is r/t cognition/mentation versus being SOB.   General Comments       Exercises Other Exercises Other Exercises: OT ed re: role of OT, changing wall O2 to portable for toileting. pt with good understanding   Shoulder Instructions      Home Living Family/patient expects to be discharged to:: Private residence Living Arrangements: Alone Available Help at Discharge: Family (brother lives near [place]) Type of Home: Mobile home Home Access: Stairs to enter Technical brewer of Steps: 3 Entrance Stairs-Rails: Right;Left Home Layout: One level               Home Equipment: None          Prior Functioning/Environment Level of Independence: Independent        Comments: reports +driving, INDEP with all aspects of self care and mobility        OT Problem List: Cardiopulmonary status limiting activity      OT  Treatment/Interventions: Self-care/ADL training;Therapeutic activities    OT Goals(Current goals can be found in the care plan section) Acute Rehab OT Goals Patient Stated Goal: none stated OT Goal Formulation: All assessment and education complete, DC therapy  OT Frequency:     Barriers to D/C:            Co-evaluation              AM-PAC OT "6 Clicks" Daily Activity     Outcome Measure Help from another person eating meals?: None Help from another person taking care of personal grooming?: None Help from another person toileting, which includes using toliet, bedpan, or urinal?: None Help from another person bathing (including washing, rinsing, drying)?: None Help from another person to put on and taking off regular upper body clothing?: None Help from another person to put on and taking off regular lower body clothing?: None 6 Click Score: 24   End of Session Nurse Communication: Mobility status  Activity Tolerance: Patient tolerated treatment well Patient left: Other (comment);with call bell/phone within reach (seated EOB)  OT Visit Diagnosis: Muscle weakness (generalized) (M62.81)                Time: 7209-4709 OT Time Calculation (min): 11 min Charges:  OT General Charges $OT Visit: 1 Visit OT Evaluation $OT Eval Low Complexity: [place], [place], OTR/L ascom [phone] 05/15/21, 1:46 PM

## 2021-05-15 NOTE — ED Notes (Signed)
Pt ambulatory to toilet

## 2021-05-15 NOTE — Consult Note (Signed)
NAME:  Derek Clarke, MRN:  [id number], DOB:  November 18, 1955, LOS: 0 ADMISSION DATE:  05/14/2021, CONSULTATION DATE:  05/15/21 REFERRING MD: Wynelle Cleveland, CHIEF COMPLAINT:  Hemoptysis   History of Present Illness:  65 y.o. male presenting with progressive shortness of breath and small volume hemoptysis. CTA chest demonstrated left hilar soft tissue prominence encasing the left main pulmonary artery with narrowing of the distal left mainstem bronchus, with significant narrowing of the left upper lobe. There is consolidation versus atelectasis of the lingula and asymmetric L>R interlobular septal thickening concerning for lymphangitic spread versus volume overload. Right middle lobe is atelectatic. Diffuse bronchial wall thickening. Bulky bilateral mediastinal and hilar nodes are present as well. Small R>L pleural effusions.  He reports cough for months and recent small volume of blood mixed with sputum over the last 3 days. O2 requirement of 3L/min in the ED. He has a >30 pack year smoking history and was smoking up until this week when his cough and shortness of breath worsened. 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 No family history of lung cancer. He reports that he has been taking Advil and Mucinex frequently in recent months.  Pertinent  Medical History  Tobacco use  Significant Hospital Events: Including procedures, antibiotic start and stop dates in addition to other pertinent events   8/21: admitted for hemoptysis  Interim History / Subjective:  N/A  Objective   Blood pressure (!) 123/94, pulse (!) 107, temperature 98.9 F (37.2 C), resp. rate 19, height 5\' 9"  (1.753 m), weight 102.1 kg, SpO2 93 %.       No intake or output data in the 24 hours ending 05/15/21 1055 Filed Weights   05/14/21 2243  Weight: 102.1 kg    Examination: General: Caucasian male in NAD HENT: [place]/AT, PERRL, Mallampati  IV Lungs: decreased at bases bilaterally, faint expiratory wheeze in left upper lung fields Cardiovascular: RRR, no M/R/G Abdomen: obese, NTND, NABS Extremities: no C/C/E Neuro: grossly intact GU: deferred   CT ANGIOGRAPHY CHEST WITH CONTRAST 05/15/2021 IMPRESSION: 1. No pulmonary embolus or acute aortic syndrome. 2. Bulky abnormal soft tissue at the left hilum encasing the main left pulmonary artery and severely narrowing the proximal lobar branches, consistent with malignancy. 3. Multiple enlarged mediastinal lymph nodes, consistent with lymphatic metastatic disease. 4. Bilateral adrenal nodules, measuring up to 2.0 x 1.3 cm. The right adrenal nodule is concerning for metastatic disease. The left may be an adenoma. 5. Small pericardial effusion and small pleural effusions. 6. Consolidation in the lingula may be postobstructive.  Resolved Hospital Problem list   N/A  Assessment & Plan:  Left hilar mass-like area suspicious for primary lung malignancy Small volume hemoptysis Acute hypoxic respiratory failure Probable underlying COPD  - Plan for bronchoscopy early this week for tissue diagnosis of likely malignancy - Please make patient NPO at midnight for possible procedure tomorrow - Hold anticoagulation - Would treat for post-obstructive pneumonia - Check MRSA nasal swab; would add vanc if positive - Start Duo-Nebs TID - Collect sputum culture - Avoid aggressive pulmonary toileting - Will need to complete workup with CT a/p and MRI brain for staging; can be completed prior to discharge or in outpatient setting - Consider Oncology consult while hospitalized  - If large volume hemoptysis occurs:  - Place patient LEFT side down  - Right mainstem intubation  - Call IR/Vascular for pulmonary artery embolization  - Call PCCM for escalation of  care  Best Practice (right click and "Reselect all SmartList Selections" daily)   Diet/type: Regular consistency (see orders); NPO at  midight DVT prophylaxis: SCD GI prophylaxis: N/A Lines: N/A Foley:  N/A Code Status:  full code Last date of multidisciplinary goals of care discussion [N/A]  Labs   CBC: Recent Labs  Lab 05/14/21 2306  WBC 10.5  NEUTROABS 8.4*  HGB 14.4  HCT 42.6  MCV 85.4  PLT 027    Basic Metabolic Panel: Recent Labs  Lab 05/14/21 2306  NA 138  K 3.4*  CL 99  CO2 29  GLUCOSE 108*  BUN 12  CREATININE 0.58*  CALCIUM 9.0   GFR: Estimated Creatinine Clearance: 109.9 mL/min (A) (by C-G formula based on SCr of 0.58 mg/dL (L)). Recent Labs  Lab 05/14/21 2306 05/15/21 0006  PROCALCITON <0.10  --   WBC 10.5  --   LATICACIDVEN  --  1.1    Liver Function Tests: Recent Labs  Lab 05/14/21 2306  AST 30  ALT 33  ALKPHOS 100  BILITOT 1.1  PROT 6.7  ALBUMIN 3.7   No results for input(s): LIPASE, AMYLASE in the last 168 hours. No results for input(s): AMMONIA in the last 168 hours.  ABG    Component Value Date/Time   PHART 7.51 (H) 05/15/2021 0102   PCO2ART 40 05/15/2021 0102   PO2ART 72 (L) 05/15/2021 0102   HCO3 31.9 (H) 05/15/2021 0102   O2SAT 95.7 05/15/2021 0102     Coagulation Profile: No results for input(s): INR, PROTIME in the last 168 hours.  Cardiac Enzymes: No results for input(s): CKTOTAL, CKMB, CKMBINDEX, TROPONINI in the last 168 hours.  HbA1C: No results found for: HGBA1C  CBG: No results for input(s): GLUCAP in the last 168 hours.  Review of Systems:   Pertinent findings noted in HPI. All other systems reviewed and are negative unless otherwise documented.  Past Medical History:  He,  has no past medical history on file.   Surgical History:  History reviewed. No pertinent surgical history.   Social History:   reports that he has been smoking cigarettes. He has never used smokeless tobacco. He reports that he does not drink alcohol.   Family History:  His family history is not on file.   Allergies No Known Allergies   Home Medications   Prior to Admission medications   Medication Sig Start Date End Date Taking? Authorizing Provider  aspirin 325 MG tablet Take 325 mg by mouth daily.   Yes [provider]  guaiFENesin (MUCINEX) 600 MG 12 hr tablet Take 600 mg by mouth 2 (two) times daily as needed for cough or to loosen phlegm.   Yes [provider]  ibuprofen (ADVIL) 400 MG tablet Take 400 mg by mouth every 6 (six) hours as needed.   Yes [provider]     Bennie Pierini, MD 05/15/21 12:09 PM

## 2021-05-15 NOTE — ED Notes (Signed)
Patient transported to MRI 

## 2021-05-15 NOTE — H&P (Signed)
History and Physical    [name]:[id number] DOB: 1955-10-16 DOA: 05/14/2021  PCP: Pcp, No   Patient coming from: home  I have personally briefly reviewed patient's old medical records in [place]  Chief Complaint: cough and shortness of breath  HPI: Derek Clarke is a 65 y.o. male with medical history significant for Nicotine dependence who presents to the ED with a 53-month history of cough and shortness of breath which worsened over the past several days and became acutely worse on the night of arrival.  He has also noted blood in sputum.  He denies fever or chills.  Denies chest pain, abdominal pain, nausea vomiting or diarrhea.  Denies lower extremity pain or swelling  ED course: On arrival afebrile, BP 156/91, pulse 100 respirations 22 with O2 sat 90% on room air ABG on 32% FiO2 pH 7.51, PCO2 40 and PO2 72 Blood work including CBC, CMP, lactic acid and procalcitonin all within normal limit  EKG, personally viewed and interpreted normal sinus rhythm at 91 with nonspecific ST-T wave changes and right bundle branch block  Imaging: CTA chest negative for PE but with bulky left hilar mass consistent with malignancy as well as bilateral adrenal nodules with right adrenal nodule concerning for metastatic disease  Patient started on Rocephin and azithromycin.  Hospitalist consulted for admission.  Review of Systems: As per HPI otherwise all other systems on review of systems negative.    History reviewed. No pertinent past medical history.  History reviewed. No pertinent surgical history.   reports that he has been smoking cigarettes. He has never used smokeless tobacco. He reports that he does not drink alcohol. No history on file for drug use.  No Known Allergies  History reviewed. No pertinent family history.    Prior to Admission medications   Not on File    Physical Exam: Vitals:   05/14/21 2247 05/14/21 2315 05/15/21 0200 05/15/21 0230  BP: (!) 156/91  (!) 145/88  139/87  Pulse: 100 96 (!) 105 (!) 101  Resp: (!) 22 20 (!) 33 (!) 32  Temp: 98.7 F (37.1 C)   98.9 F (37.2 C)  TempSrc: Oral     SpO2: 90% 96% 92% 94%  Weight:      Height:         Vitals:   05/14/21 2247 05/14/21 2315 05/15/21 0200 05/15/21 0230  BP: (!) 156/91 (!) 145/88  139/87  Pulse: 100 96 (!) 105 (!) 101  Resp: (!) 22 20 (!) 33 (!) 32  Temp: 98.7 F (37.1 C)   98.9 F (37.2 C)  TempSrc: Oral     SpO2: 90% 96% 92% 94%  Weight:      Height:          Constitutional: Alert and oriented x 3 .  Tachypneic, tripoding, coughing up blood-tinged sputum HEENT:      Head: Normocephalic and atraumatic.         Eyes: PERLA, EOMI, Conjunctivae are normal. Sclera is non-icteric.       Mouth/Throat: Mucous membranes are moist.       Neck: Supple with no signs of meningismus. Cardiovascular: Regular rate and rhythm. No murmurs, gallops, or rubs. 2+ symmetrical distal pulses are present . No JVD. No LE edema Respiratory: Respiratory effort increased, with coarse breath sounds on left.  Speaking in 2 and 3 word sentences, tachypneic.   Gastrointestinal: Soft, non tender, and non distended with positive bowel sounds.  Genitourinary: No CVA tenderness.  Musculoskeletal: Nontender with normal range of motion in all extremities. No cyanosis, or erythema of extremities. Neurologic:  Face is symmetric. Moving all extremities. No gross focal neurologic deficits . Skin: Skin is warm, dry.  No rash or ulcers Psychiatric: Mood and affect are normal    Labs on Admission: I have personally reviewed following labs and imaging studies  CBC: Recent Labs  Lab 05/14/21 2306  WBC 10.5  NEUTROABS 8.4*  HGB 14.4  HCT 42.6  MCV 85.4  PLT 782   Basic Metabolic Panel: Recent Labs  Lab 05/14/21 2306  NA 138  K 3.4*  CL 99  CO2 29  GLUCOSE 108*  BUN 12  CREATININE 0.58*  CALCIUM 9.0   GFR: Estimated Creatinine Clearance: 109.9 mL/min (A) (by C-G formula based on SCr of  0.58 mg/dL (L)). Liver Function Tests: Recent Labs  Lab 05/14/21 2306  AST 30  ALT 33  ALKPHOS 100  BILITOT 1.1  PROT 6.7  ALBUMIN 3.7   No results for input(s): LIPASE, AMYLASE in the last 168 hours. No results for input(s): AMMONIA in the last 168 hours. Coagulation Profile: No results for input(s): INR, PROTIME in the last 168 hours. Cardiac Enzymes: No results for input(s): CKTOTAL, CKMB, CKMBINDEX, TROPONINI in the last 168 hours. BNP (last 3 results) No results for input(s): PROBNP in the last 8760 hours. HbA1C: No results for input(s): HGBA1C in the last 72 hours. CBG: No results for input(s): GLUCAP in the last 168 hours. Lipid Profile: No results for input(s): CHOL, HDL, LDLCALC, TRIG, CHOLHDL, LDLDIRECT in the last 72 hours. Thyroid Function Tests: No results for input(s): TSH, T4TOTAL, FREET4, T3FREE, THYROIDAB in the last 72 hours. Anemia Panel: No results for input(s): VITAMINB12, FOLATE, FERRITIN, TIBC, IRON, RETICCTPCT in the last 72 hours. Urine analysis: No results found for: COLORURINE, APPEARANCEUR, LABSPEC, [place], GLUCOSEU, HGBUR, BILIRUBINUR, KETONESUR, PROTEINUR, UROBILINOGEN, NITRITE, LEUKOCYTESUR  Radiological Exams on Admission: DG Chest 1 View  Result Date: 05/14/2021 CLINICAL DATA:  Shortness of breath for couple of months. EXAM: CHEST  1 VIEW COMPARISON:  None. FINDINGS: Mild cardiac enlargement. Perihilar infiltrates bilaterally may represent edema or multifocal pneumonia. No pleural effusions. No pneumothorax. Mediastinal contours appear intact. IMPRESSION: Bilateral perihilar infiltrates. Electronically Signed   By: Lucienne Capers M.D.   On: 05/14/2021 23:11   CT Angio Chest PE W/Cm &/Or Wo Cm  Result Date: 05/15/2021 CLINICAL DATA:  Hemoptysis EXAM: CT ANGIOGRAPHY CHEST WITH CONTRAST TECHNIQUE: Multidetector CT imaging of the chest was performed using the standard protocol during bolus administration of intravenous contrast. Multiplanar CT  image reconstructions and MIPs were obtained to evaluate the vascular anatomy. CONTRAST:  127mL OMNIPAQUE IOHEXOL 350 MG/ML SOLN COMPARISON:  None. FINDINGS: Cardiovascular: Contrast injection is sufficient to demonstrate satisfactory opacification of the pulmonary arteries to the segmental level. There is no pulmonary embolus or evidence of right heart strain. The size of the main pulmonary artery is normal. Proximal left pulmonary arteries are severely narrowed by confluent soft tissue surrounding the left hilum. The course and caliber of the aorta are normal. There is atherosclerotic calcification. Opacification decreased due to pulmonary arterial phase contrast bolus timing. There is a small pericardial effusion. Mediastinum/Nodes: There is bulky abnormal mediastinal soft tissue with in case mint of the left common carotid artery and proximal left subclavian artery. There are multiple enlarged mediastinal lymph nodes. Level 4 are node measures 10 mm. Lungs/Pleura: There is confluent soft tissue at the left hilum encasing the main left pulmonary artery and  severely narrowing the proximal lobar branches. Abnormal soft tissue extends in a peribronchial pattern. There is also abnormal soft tissue at the right hilum. There is atelectasis within the right middle lobe and an area of consolidation in the lingula. Small pleural effusions. Upper Abdomen: Contrast bolus timing is not optimized for evaluation of the abdominal organs. There is an intermediate density 2.0 x 1.3 cm right adrenal nodule. There is a lower density left adrenal nodule measuring 1.5 cm. Musculoskeletal: No chest wall abnormality. No bony spinal canal stenosis. Review of the MIP images confirms the above findings. IMPRESSION: 1. No pulmonary embolus or acute aortic syndrome. 2. Bulky abnormal soft tissue at the left hilum encasing the main left pulmonary artery and severely narrowing the proximal lobar branches, consistent with malignancy. 3.  Multiple enlarged mediastinal lymph nodes, consistent with lymphatic metastatic disease. 4. Bilateral adrenal nodules, measuring up to 2.0 x 1.3 cm. The right adrenal nodule is concerning for metastatic disease. The left may be an adenoma. 5. Small pericardial effusion and small pleural effusions. 6. Consolidation in the lingula may be postobstructive. Aortic atherosclerosis (ICD10-I70.0). Electronically Signed   By: Ulyses Jarred M.D.   On: 05/15/2021 01:57     Assessment/Plan 65 year old male with history of nicotine dependence presenting with cough, shortness of breath and hemoptysis x2 months becoming acutely worse on day of arrival.      Acute respiratory failure with hypoxia (HCC)   Hemoptysis   Mass of left lung on CT, suspect malignancy -Patient with acute respiratory distress and increased work of breathing -ABG on FiO2 32% ,pH 7.51, PCO2 40 and PO2 72 - WBC normal, lactic acid normal, procalcitonin normal -CTA chest negative for PE but with bulky left hilar mass consistent with malignancy as well as bilateral adrenal nodules with right adrenal nodule concerning for metastatic disease - DuoNebs as needed for symptomatic relief - SCDs for DVT prophylaxis given hemoptysis  Adrenal nodule - Possible metastatic disease    Nicotine dependence - Nicotine patch    DVT prophylaxis: SCDs  code Status: full code  Family Communication:  none  Disposition Plan: Back to previous home environment Consults called: none  Status:At the time of admission, it appears that the appropriate admission status for this patient is INPATIENT. This is judged to be reasonable and necessary in order to provide the required intensity of service to ensure the patient's safety given the presenting symptoms, physical exam findings, and initial radiographic and laboratory data in the context of their  Comorbid conditions.   Patient requires inpatient status due to high intensity of service, high risk for further  deterioration and high frequency of surveillance required.   I certify that at the point of admission it is my clinical judgment that the patient will require inpatient hospital care spanning beyond [place] MD [facility]     05/15/2021, 3:15 AM

## 2021-05-15 NOTE — ED Notes (Signed)
Breakfast try given at this time.

## 2021-05-15 NOTE — Progress Notes (Signed)
PT Cancellation Note  Patient Details Name: Derek Clarke MRN: [id number] DOB: 1955/12/09   Cancelled Treatment:    Reason Eval/Treat Not Completed: Other (comment).  PT consult received.  Chart reviewed.  Pt reports recently moving to new ED room and did not feel up to therapy at this time.  Will re-attempt PT evaluation at a later date/time as medically appropriate.  Leitha Bleak, PT 05/15/21, 4:21 PM

## 2021-05-15 NOTE — ED Notes (Signed)
Pt taken to CT via wheelchair.

## 2021-05-16 DIAGNOSIS — C3412 Malignant neoplasm of upper lobe, left bronchus or lung: Secondary | ICD-10-CM | POA: Diagnosis not present

## 2021-05-16 DIAGNOSIS — R042 Hemoptysis: Secondary | ICD-10-CM

## 2021-05-16 LAB — MRSA NEXT GEN BY PCR, NASAL: MRSA by PCR Next Gen: NOT DETECTED

## 2021-05-16 MED ORDER — BUDESONIDE 0.5 MG/2ML IN SUSP
0.5000 mg | Freq: Two times a day (BID) | RESPIRATORY_TRACT | Status: DC
  Administered 2021-05-16 – 2021-05-22 (×13): 0.5 mg via RESPIRATORY_TRACT
  Filled 2021-05-16 (×13): qty 2

## 2021-05-16 MED ORDER — ADULT MULTIVITAMIN W/MINERALS CH
1.0000 | ORAL_TABLET | Freq: Every day | ORAL | Status: DC
  Administered 2021-05-16 – 2021-05-22 (×7): 1 via ORAL
  Filled 2021-05-16 (×6): qty 1

## 2021-05-16 MED ORDER — SODIUM CHLORIDE 0.9 % IV SOLN
1.0000 g | INTRAVENOUS | Status: AC
  Administered 2021-05-16 – 2021-05-19 (×4): 1 g via INTRAVENOUS
  Filled 2021-05-16 (×2): qty 10
  Filled 2021-05-16: qty 1
  Filled 2021-05-16: qty 10

## 2021-05-16 MED ORDER — LORAZEPAM 2 MG/ML IJ SOLN: 1.0000 mg | Freq: Once | INTRAMUSCULAR | Status: DC

## 2021-05-16 MED ORDER — AZITHROMYCIN 500 MG PO TABS
500.0000 mg | ORAL_TABLET | Freq: Every day | ORAL | Status: AC
  Administered 2021-05-16: 500 mg via ORAL
  Filled 2021-05-16: qty 1

## 2021-05-16 MED ORDER — ENSURE ENLIVE PO LIQD
237.0000 mL | Freq: Two times a day (BID) | ORAL | Status: DC
  Administered 2021-05-16 – 2021-05-22 (×12): 237 mL via ORAL

## 2021-05-16 MED ORDER — IPRATROPIUM-ALBUTEROL 0.5-2.5 (3) MG/3ML IN SOLN
3.0000 mL | RESPIRATORY_TRACT | Status: DC
  Administered 2021-05-16 – 2021-05-17 (×5): 3 mL via RESPIRATORY_TRACT
  Filled 2021-05-16 (×6): qty 3

## 2021-05-16 MED ORDER — AZITHROMYCIN 250 MG PO TABS
250.0000 mg | ORAL_TABLET | Freq: Every day | ORAL | Status: DC
  Administered 2021-05-17 – 2021-05-19 (×3): 250 mg via ORAL
  Filled 2021-05-16 (×3): qty 1

## 2021-05-16 MED ORDER — METHYLPREDNISOLONE SODIUM SUCC 40 MG IJ SOLR
40.0000 mg | Freq: Two times a day (BID) | INTRAMUSCULAR | Status: DC
  Administered 2021-05-16 – 2021-05-19 (×7): 40 mg via INTRAVENOUS
  Filled 2021-05-16 (×7): qty 1

## 2021-05-16 MED ORDER — LORAZEPAM 1 MG PO TABS
1.0000 mg | ORAL_TABLET | Freq: Once | ORAL | Status: AC
  Administered 2021-05-17: 1 mg via ORAL
  Filled 2021-05-16: qty 1

## 2021-05-16 MED ORDER — COVID-19 MRNA VACC (MODERNA) 50 MCG/0.25ML IM SUSP
0.2500 mL | Freq: Once | INTRAMUSCULAR | Status: AC
  Filled 2021-05-16: qty 0.25

## 2021-05-16 NOTE — Consult Note (Signed)
[place]  [phone]) [phone] [phone]) [phone]  ID: Derek Clarke OB: Feb 03, 1956  MR#: [id number]  FOY#:[id number]  Patient Care Team: Pcp, No as PCP - General  CHIEF COMPLAINT: Lung mass and multiple liver lesions concerning for malignancy.  INTERVAL HISTORY: Patient is a 65 year old male who initially presented to the emergency room with increasing cough and shortness of breath.  His symptoms were progressive over the last several days and then became acutely worse on day of admission yesterday.  Subsequent imaging revealed significant hilar adenopathy with possible mass as well as bilateral adrenal lesions and multiple liver lesions concerning for malignancy.  He continues to have shortness of breath and cough but admits it is improved since admission.  He has no neurologic complaints.  He denies any fevers.  He has a fair appetite and denies weight loss.  He has not chest pain or hemoptysis.  He denies any nausea, vomiting, constipation, or diarrhea.  He has no urinary complaints patient feels generally terrible, but offers no further specific complaints today.  REVIEW OF SYSTEMS:   Review of Systems  Constitutional: Negative.  Negative for chills, fever and malaise/fatigue.  Respiratory:  Positive for cough and shortness of breath. Negative for hemoptysis.   Cardiovascular: Negative.  Negative for chest pain, palpitations and leg swelling.  Gastrointestinal: Negative.  Negative for abdominal pain.  Genitourinary: Negative.  Negative for dysuria.  Musculoskeletal: Negative.  Negative for back pain.  Skin: Negative.  Negative for rash.  Neurological: Negative.  Negative for dizziness, focal weakness, weakness and headaches.  Psychiatric/Behavioral:  The patient is nervous/anxious.    As per HPI. Otherwise, a complete review of systems is negative.  PAST MEDICAL HISTORY: History reviewed. No pertinent past medical history.  PAST SURGICAL HISTORY: History  reviewed. No pertinent surgical history.  FAMILY HISTORY: History reviewed. No pertinent family history.  ADVANCED DIRECTIVES (Y/N):  @ADVDIR @  HEALTH MAINTENANCE: Social History   Tobacco Use   Smoking status: Every Day    Types: Cigarettes   Smokeless tobacco: Never  Substance Use Topics   Alcohol use: Never     Colonoscopy:  PAP:  Bone density:  Lipid panel:  No Known Allergies  Current Facility-Administered Medications  Medication Dose Route Frequency Provider Last Rate Last Admin   albuterol (PROVENTIL) (2.5 MG/3ML) 0.083% nebulizer solution 2.5 mg  2.5 mg Nebulization Q2H PRN Athena Masse, MD   2.5 mg at 05/15/21 1008   [START ON 05/17/2021] azithromycin (ZITHROMAX) tablet 250 mg  250 mg Oral Daily Manuella Ghazi, Vipul, MD       budesonide (PULMICORT) nebulizer solution 0.5 mg  0.5 mg Nebulization BID Mortimer Fries, Kurian, MD   0.5 mg at 05/16/21 1030   cefTRIAXone (ROCEPHIN) 1 g in sodium chloride 0.9 % 100 mL IVPB  1 g Intravenous Q24H Manuella Ghazi, Vipul, MD 200 mL/hr at 05/16/21 1549 1 g at 05/16/21 1549   [START ON 05/17/2021] COVID-19 mRNA vaccine (Moderna) injection 0.25 mL  0.25 mL Intramuscular ONCE-1600 Max Sane, MD       feeding supplement (ENSURE ENLIVE / ENSURE PLUS) liquid 237 mL  237 mL Oral BID BM Manuella Ghazi, Vipul, MD   237 mL at 05/16/21 1555   guaiFENesin (MUCINEX) 12 hr tablet 600 mg  600 mg Oral BID Debbe Odea, MD   600 mg at 05/16/21 1019   ipratropium-albuterol (DUONEB) 0.5-2.5 (3) MG/3ML nebulizer solution 3 mL  3 mL Nebulization Q4H Flora Lipps, MD   3 mL at 05/16/21 1450   LORazepam (  ATIVAN) tablet 1 mg  1 mg Oral Once Max Sane, MD       methylPREDNISolone sodium succinate (SOLU-MEDROL) 40 mg/mL injection 40 mg  40 mg Intravenous Q12H Flora Lipps, MD   40 mg at 05/16/21 1014   multivitamin with minerals tablet 1 tablet  1 tablet Oral Daily Max Sane, MD   1 tablet at 05/16/21 1555    OBJECTIVE: Vitals:   05/16/21 1451 05/16/21 1538  BP:  135/87  Pulse: 97 90   Resp: 20 16  Temp:  98.2 F (36.8 C)  SpO2: 96% 95%     Body mass index is 34.56 kg/m.    ECOG FS:2 - Symptomatic, <50% confined to bed  General: Well-developed, well-nourished, mild respiratory distress. Eyes: Pink conjunctiva, anicteric sclera. HEENT: Normocephalic, moist mucous membranes. Lungs: Positive coughing, no audible wheezing.   Heart: Regular rate and rhythm. Abdomen: Soft, nontender, no obvious distention. Musculoskeletal: No edema, cyanosis, or clubbing. Neuro: Alert, answering all questions appropriately. Cranial nerves grossly intact. Skin: No rashes or petechiae noted. Psych: Normal affect. Lymphatics: No cervical, calvicular, axillary or inguinal LAD.   LAB RESULTS:  Lab Results  Component Value Date   NA 138 05/14/2021   K 3.4 (L) 05/14/2021   CL 99 05/14/2021   CO2 29 05/14/2021   GLUCOSE 108 (H) 05/14/2021   BUN 12 05/14/2021   CREATININE 0.58 (L) 05/14/2021   CALCIUM 9.0 05/14/2021   PROT 6.7 05/14/2021   ALBUMIN 3.7 05/14/2021   AST 30 05/14/2021   ALT 33 05/14/2021   ALKPHOS 100 05/14/2021   BILITOT 1.1 05/14/2021   GFRNONAA >60 05/14/2021    Lab Results  Component Value Date   WBC 10.5 05/14/2021   NEUTROABS 8.4 (H) 05/14/2021   HGB 14.4 05/14/2021   HCT 42.6 05/14/2021   MCV 85.4 05/14/2021   PLT 185 05/14/2021     STUDIES: DG Chest 1 View  Result Date: 05/14/2021 CLINICAL DATA:  Shortness of breath for couple of months. EXAM: CHEST  1 VIEW COMPARISON:  None. FINDINGS: Mild cardiac enlargement. Perihilar infiltrates bilaterally may represent edema or multifocal pneumonia. No pleural effusions. No pneumothorax. Mediastinal contours appear intact. IMPRESSION: Bilateral perihilar infiltrates. Electronically Signed   By: Lucienne Capers M.D.   On: 05/14/2021 23:11   CT Angio Chest PE W/Cm &/Or Wo Cm  Result Date: 05/15/2021 CLINICAL DATA:  Hemoptysis EXAM: CT ANGIOGRAPHY CHEST WITH CONTRAST TECHNIQUE: Multidetector CT imaging of the  chest was performed using the standard protocol during bolus administration of intravenous contrast. Multiplanar CT image reconstructions and MIPs were obtained to evaluate the vascular anatomy. CONTRAST:  122mL OMNIPAQUE IOHEXOL 350 MG/ML SOLN COMPARISON:  None. FINDINGS: Cardiovascular: Contrast injection is sufficient to demonstrate satisfactory opacification of the pulmonary arteries to the segmental level. There is no pulmonary embolus or evidence of right heart strain. The size of the main pulmonary artery is normal. Proximal left pulmonary arteries are severely narrowed by confluent soft tissue surrounding the left hilum. The course and caliber of the aorta are normal. There is atherosclerotic calcification. Opacification decreased due to pulmonary arterial phase contrast bolus timing. There is a small pericardial effusion. Mediastinum/Nodes: There is bulky abnormal mediastinal soft tissue with in case mint of the left common carotid artery and proximal left subclavian artery. There are multiple enlarged mediastinal lymph nodes. Level 4 are node measures 10 mm. Lungs/Pleura: There is confluent soft tissue at the left hilum encasing the main left pulmonary artery and severely narrowing the proximal lobar branches.  Abnormal soft tissue extends in a peribronchial pattern. There is also abnormal soft tissue at the right hilum. There is atelectasis within the right middle lobe and an area of consolidation in the lingula. Small pleural effusions. Upper Abdomen: Contrast bolus timing is not optimized for evaluation of the abdominal organs. There is an intermediate density 2.0 x 1.3 cm right adrenal nodule. There is a lower density left adrenal nodule measuring 1.5 cm. Musculoskeletal: No chest wall abnormality. No bony spinal canal stenosis. Review of the MIP images confirms the above findings. IMPRESSION: 1. No pulmonary embolus or acute aortic syndrome. 2. Bulky abnormal soft tissue at the left hilum encasing the  main left pulmonary artery and severely narrowing the proximal lobar branches, consistent with malignancy. 3. Multiple enlarged mediastinal lymph nodes, consistent with lymphatic metastatic disease. 4. Bilateral adrenal nodules, measuring up to 2.0 x 1.3 cm. The right adrenal nodule is concerning for metastatic disease. The left may be an adenoma. 5. Small pericardial effusion and small pleural effusions. 6. Consolidation in the lingula may be postobstructive. Aortic atherosclerosis (ICD10-I70.0). Electronically Signed   By: Ulyses Jarred M.D.   On: 05/15/2021 01:57   CT ABDOMEN PELVIS W CONTRAST  Result Date: 05/15/2021 CLINICAL DATA:  Abnormal left hilar soft tissue noted on chest CTA. This was concerning for malignancy. Additional evaluation to assess for possible metastatic disease. EXAM: CT ABDOMEN AND PELVIS WITH CONTRAST TECHNIQUE: Multidetector CT imaging of the abdomen and pelvis was performed using the standard protocol following bolus administration of intravenous contrast. CONTRAST:  2mL OMNIPAQUE IOHEXOL 350 MG/ML SOLN COMPARISON:  Current chest CTA. FINDINGS: Lower chest: Small right effusion. Left lung base subcentimeter nodules. Small patchy areas of ground-glass opacity at the left lung base. Pericardial effusion. No change from the earlier chest CTA. Hepatobiliary: Subtle liver lesions suspicious for metastatic disease. Largest lies in the right lobe, segment 6, 1.4 cm in size. Liver normal in size and overall attenuation. Mildly distended gallbladder. Increased attenuation material in the dependent gallbladder possibly small stones or sludge. No wall thickening or inflammation. No bile duct dilation. Pancreas: Unremarkable. No pancreatic ductal dilatation or surrounding inflammatory changes. Spleen: Normal in size without focal abnormality. Adrenals/Urinary Tract: Bilateral adrenal masses, right 2.1 x 1.4 cm, left 1.1 cm size. Symmetric renal enhancement and excretion. No renal masses, stones  or hydronephrosis. Normal ureters. Bladder minimally distended. Wall appears thickened. No bladder mass. Stomach/Bowel: Normal stomach. Small bowel and colon are normal in caliber. No wall thickening. No inflammation. Normal appendix suggested. No evidence of appendicitis. Vascular/Lymphatic: Mild aortic atherosclerosis.  No aneurysm. Prominent lymph nodes above the celiac axis, largest 1.2 cm in short axis. Prominent but subcentimeter retroperitoneal lymph nodes. No other enlarged nodes. Reproductive: Significant prostate enlargement elevating the bladder base. Prostate measures 6.9 cm superior to inferior by 5.8 x 5.6 cm transversely. Other: No abdominal wall hernia or abnormality. No abdominopelvic ascites. Musculoskeletal: Small area of sclerosis in the right ilium, likely a bone island. More definitive bone island noted in the anterior left femoral head. Vague sclerotic lesion along the lower endplate of T9. No osteolytic lesions. No fracture or acute finding. IMPRESSION: 1. Subtle low-density liver lesions consistent with metastatic disease. 2. Bilateral adrenal masses suspicious for metastatic disease. 3. Prominent lymph nodes above the celiac axis suspicious for metastatic disease. 4. Subtle areas of sclerosis, right ilium and lower endplate of T9, nonspecific, possibly metastatic disease. 5. No acute findings. Electronically Signed   By: Lajean Manes M.D.   On: 05/15/2021 17:56  ASSESSMENT: Lung mass and multiple liver lesions concerning for malignancy.  PLAN:    Lung mass and multiple liver lesions concerning for malignancy: Appreciate pulmonary input who feels this may be more infectious than malignant and plans to have repeat CT scan followed by bronchoscopy in 4 to 6 weeks.  Case discussed with hospitalist and will get MRI of the abdomen to further define liver and adrenal lesions.  If concerning, can consider liver biopsy in the next several days.  Patient may also require PET scan upon  discharge. Shortness of breath/cough: Continue antibiotics and steroids.  Appreciate consult, will follow.   Lloyd Huger, MD   05/16/2021 5:51 PM

## 2021-05-16 NOTE — Progress Notes (Signed)
MRI called and ready for patient. Patient states that he cant lay on his back for 79min due to Sob. Sob noted with speaking. Asked if he thought he could do it with medication and states no. Refuses at this time.

## 2021-05-16 NOTE — Progress Notes (Signed)
Initial Nutrition Assessment  DOCUMENTATION CODES:  Not applicable  INTERVENTION:  Continue current diet as ordered, encourage PO intake Ensure Enlive po BID, each supplement provides 350 kcal and 20 grams of protein MVI with minerals daily  NUTRITION DIAGNOSIS:  Inadequate oral intake related to  (SOB while eating and early satiety) as evidenced by per patient/family report.  GOAL:  Patient will meet greater than or equal to 90% of their needs  MONITOR:  PO intake, Supplement acceptance, Labs, Weight trends  REASON FOR ASSESSMENT:  Consult Assessment of nutrition requirement/status  ASSESSMENT:  65 y.o. male with hx of tobacco abuse who presented to ED from home with cough and SOB x2 months but acutely worse over the past several days. Endorses hemoptysis.  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 Oncology consult pending.  Pt resting in bedside chair at the time of visit. Pt reports that he feels his appetite is at baseline currently. Usually eats "at least two times" a day at home but portions have gotten smaller lately. Unsure of weight, does not have a scale at home. Ranges from 225-250 lb in the past. Does not feel that he has lost a significant amount of weight recently. Will request new measured to determine new baseline.  Pt easily winded during interview. States that this also happens when he eats and experiences early satiety. Agreeable to nutrition supplements to assist in meeting needs. Mild deficits noted on exam.   Nutritionally Relevant Medications: Scheduled Meds:  methylPREDNISolone (SOLU-MEDROL)   40 mg Intravenous Q12H   Labs Reviewed  NUTRITION - FOCUSED PHYSICAL EXAM: Flowsheet Row Most Recent Value  Orbital Region Mild depletion  Upper Arm Region No depletion  Thoracic and Lumbar Region No depletion  Buccal Region No depletion  Temple Region Mild depletion  Clavicle Bone Region Mild depletion  Clavicle and Acromion Bone  Region No depletion  Scapular Bone Region No depletion  Dorsal Hand No depletion  Patellar Region No depletion  Anterior Thigh Region No depletion  Posterior Calf Region No depletion  Edema (RD Assessment) None  Hair Reviewed  Eyes Reviewed  Mouth Reviewed  Skin Reviewed  Nails Reviewed   Diet Order:   Diet Order             Diet regular Room service appropriate? Yes; Fluid consistency: Thin  Diet effective now                   EDUCATION NEEDS:  No education needs have been identified at this time  Skin:  Skin Assessment: Reviewed RN Assessment  Last BM:  8/20  Height:  Ht Readings from Last 1 Encounters:  05/14/21 5\' 9"  (1.753 m)    Weight:  Wt Readings from Last 1 Encounters:  05/14/21 102.1 kg    Ideal Body Weight:  72.7 kg  BMI:  Body mass index is 33.23 kg/m.  Estimated Nutritional Needs:  Kcal:  2300-2400 kcal/d Protein:  115-125 g/d Fluid:  2.2-2.4 L/d   Ranell Patrick, RD, LDN Clinical Dietitian Pager on [place]

## 2021-05-16 NOTE — Evaluation (Signed)
Physical Therapy Evaluation Patient Details Name: Derek Clarke MRN: [id number] DOB: [birth year] Today's Date: 05/16/2021   History of Present Illness  Pt is a 65 y.o. male presenting to hospital 8/20 with SOB and cough x2 months (worsening past several days and acutely worse tonight); also reporting hemoptysis.  Pt admitted with acute respiratory failure with hypoxia, hemoptysis, mass of L lung on CT (suspect malignancy); concern for metastatic disease.  Clinical Impression  Prior to hospital admission, pt was independent with ambulation; lives alone in 1 level home with 3 STE B railings; no home O2 use.  Currently pt is independent with transfers and CGA ambulating 40 feet (no AD) in room.  Limited distance ambulating d/t SOB.  Pt on 3.5 L O2 at rest but used 4 L O2 for ambulation (d/t 3.5 L not an option on O2 tank).  O2 sats 94% or greater entire session (including post ambulation/end of session).  Pt would benefit from skilled PT to address noted impairments and functional limitations during hospitalization (see below for any additional details).  Upon hospital discharge, no further PT needs anticipated.    Follow Up Recommendations No PT follow up    Equipment Recommendations  None recommended by PT    Recommendations for Other Services OT consult     Precautions / Restrictions Precautions Precautions: Fall Restrictions Weight Bearing Restrictions: No      Mobility  Bed Mobility               General bed mobility comments: deferred (pt sitting on edge of bed beginning of session and in recliner end of session)    Transfers Overall transfer level: Independent Equipment used: None Transfers: Sit to/from [employer] to Stand: Independent Stand pivot transfers: Independent (bed to recliner)       General transfer comment: steady safe transfers  Ambulation/Gait Ambulation/Gait assistance: Min guard Gait Distance (Feet): 40 Feet Assistive  device: None Gait Pattern/deviations: Step-through pattern Gait velocity: mildly decreased   General Gait Details: steady ambulation  Stairs            Wheelchair Mobility    Modified Rankin (Stroke Patients Only)       Balance Overall balance assessment: Independent                                           Pertinent Vitals/Pain Pain Assessment: No/denies pain Pain Intervention(s): Limited activity within patient's tolerance;Monitored during session;Repositioned HR WFL during sessions activities.    Home Living Family/patient expects to be discharged to:: Private residence Living Arrangements: Alone Available Help at Discharge: Family (brother lives near [place]) Type of Home: Mobile home Home Access: Stairs to enter Entrance Stairs-Rails: Right;Left;Can reach both Technical brewer of Steps: 3 Home Layout: One level Home Equipment: None      Prior Function Level of Independence: Independent         Comments: (+) driving     Hand Dominance        Extremity/Trunk Assessment   Upper Extremity Assessment Upper Extremity Assessment: Overall WFL for tasks assessed    Lower Extremity Assessment Lower Extremity Assessment: Overall WFL for tasks assessed    Cervical / Trunk Assessment Cervical / Trunk Assessment: Normal  Communication   Communication: No difficulties  Cognition Arousal/Alertness: Awake/alert Behavior During Therapy: WFL for tasks assessed/performed Overall Cognitive Status: Within Functional Limits for tasks assessed  General Comments: Mild increased time to respond at times      General Comments  Nursing cleared pt for participation in physical therapy.  Pt agreeable to PT session.    Exercises  Mobility   Assessment/Plan    PT Assessment Patient needs continued PT services  PT Problem List Decreased activity tolerance;Decreased  mobility;Cardiopulmonary status limiting activity       PT Treatment Interventions Gait training;Stair training;Functional mobility training;Therapeutic activities;Therapeutic exercise;Patient/family education    PT Goals (Current goals can be found in the Care Plan section)  Acute Rehab PT Goals Patient Stated Goal: to improve breathing PT Goal Formulation: With patient Time For Goal Achievement: 05/30/21 Potential to Achieve Goals: Fair    Frequency Min 2X/week   Barriers to discharge        Co-evaluation               AM-PAC PT "6 Clicks" Mobility  Outcome Measure Help needed turning from your back to your side while in a flat bed without using bedrails?: None Help needed moving from lying on your back to sitting on the side of a flat bed without using bedrails?: None Help needed moving to and from a bed to a chair (including a wheelchair)?: None Help needed standing up from a chair using your arms (e.g., wheelchair or bedside chair)?: None Help needed to walk in hospital room?: A Little Help needed climbing 3-5 steps with a railing? : A Little 6 Click Score: 22    End of Session Equipment Utilized During Treatment: Gait belt;Oxygen Activity Tolerance: Other (comment) (Limited distance ambulating d/t SOB) Patient left: in chair;with call bell/phone within reach;with chair alarm set;Other (comment) (MD Manuella Ghazi present) Nurse Communication: Mobility status;Precautions;Other (comment) (MD Manuella Ghazi updated on PT session) PT Visit Diagnosis: Other abnormalities of gait and mobility (R26.89)    Time: 7902-4097 PT Time Calculation (min) (ACUTE ONLY): 21 min   Charges:   PT Evaluation $PT Eval Low Complexity: 1 Low         Earlie Arciga, PT 05/16/21, 9:51 AM

## 2021-05-16 NOTE — Progress Notes (Signed)
CHIEF COMPLAINT:   Chief Complaint  Patient presents with   Shortness of Breath    Subjective  Patient seen and examined Feels better Still feels bad On oxygen  CT chest reviewed in detail Plan for repeat CT chest in 4-6 weeks Treat as pulmonary infection ?pulmonary sarcoidosis      Review of Systems:  +weakness +fatigue +SOB Other:  All other systems negative   Objective   Examination:  General exam: Appears calm and comfortable, fatigue, looks ill Respiratory system: Clear to auscultation. Respiratory effort normal. HEENT: [place]/AT, PERRLA, no thrush, no stridor. Cardiovascular system: S1 & S2 heard, RRR. No JVD, murmurs, rubs, gallops or clicks. No pedal edema. Gastrointestinal system: Abdomen is nondistended, soft and nontender. No organomegaly or masses felt. Normal bowel sounds heard. Central nervous system: Alert and oriented. No focal neurological deficits. Extremities: Symmetric 5 x 5 power. Skin: No rashes, lesions or ulcers Psychiatry: Judgement and insight appear normal. Mood & affect appropriate.   VITALS:  height is 5\' 9"  (1.753 m) and weight is 102.1 kg. His temperature is 97.6 F (36.4 C). His blood pressure is 138/93 (abnormal) and his pulse is 81. His respiration is 17 and oxygen saturation is 97%.   I personally reviewed Labs under Results section.  Radiology Reports DG Chest 1 View  Result Date: 05/14/2021 CLINICAL DATA:  Shortness of breath for couple of months. EXAM: CHEST  1 VIEW COMPARISON:  None. FINDINGS: Mild cardiac enlargement. Perihilar infiltrates bilaterally may represent edema or multifocal pneumonia. No pleural effusions. No pneumothorax. Mediastinal contours appear intact. IMPRESSION: Bilateral perihilar infiltrates. Electronically Signed   By: Lucienne Capers M.D.   On: 05/14/2021 23:11   CT Angio Chest PE W/Cm &/Or Wo Cm  Result Date: 05/15/2021 CLINICAL DATA:  Hemoptysis EXAM: CT ANGIOGRAPHY CHEST WITH CONTRAST TECHNIQUE:  Multidetector CT imaging of the chest was performed using the standard protocol during bolus administration of intravenous contrast. Multiplanar CT image reconstructions and MIPs were obtained to evaluate the vascular anatomy. CONTRAST:  182mL OMNIPAQUE IOHEXOL 350 MG/ML SOLN COMPARISON:  None. FINDINGS: Cardiovascular: Contrast injection is sufficient to demonstrate satisfactory opacification of the pulmonary arteries to the segmental level. There is no pulmonary embolus or evidence of right heart strain. The size of the main pulmonary artery is normal. Proximal left pulmonary arteries are severely narrowed by confluent soft tissue surrounding the left hilum. The course and caliber of the aorta are normal. There is atherosclerotic calcification. Opacification decreased due to pulmonary arterial phase contrast bolus timing. There is a small pericardial effusion. Mediastinum/Nodes: There is bulky abnormal mediastinal soft tissue with in case mint of the left common carotid artery and proximal left subclavian artery. There are multiple enlarged mediastinal lymph nodes. Level 4 are node measures 10 mm. Lungs/Pleura: There is confluent soft tissue at the left hilum encasing the main left pulmonary artery and severely narrowing the proximal lobar branches. Abnormal soft tissue extends in a peribronchial pattern. There is also abnormal soft tissue at the right hilum. There is atelectasis within the right middle lobe and an area of consolidation in the lingula. Small pleural effusions. Upper Abdomen: Contrast bolus timing is not optimized for evaluation of the abdominal organs. There is an intermediate density 2.0 x 1.3 cm right adrenal nodule. There is a lower density left adrenal nodule measuring 1.5 cm. Musculoskeletal: No chest wall abnormality. No bony spinal canal stenosis. Review of the MIP images confirms the above findings. IMPRESSION: 1. No pulmonary embolus or acute aortic syndrome. 2.  Bulky abnormal soft tissue  at the left hilum encasing the main left pulmonary artery and severely narrowing the proximal lobar branches, consistent with malignancy. 3. Multiple enlarged mediastinal lymph nodes, consistent with lymphatic metastatic disease. 4. Bilateral adrenal nodules, measuring up to 2.0 x 1.3 cm. The right adrenal nodule is concerning for metastatic disease. The left may be an adenoma. 5. Small pericardial effusion and small pleural effusions. 6. Consolidation in the lingula may be postobstructive. Aortic atherosclerosis (ICD10-I70.0). Electronically Signed   By: Ulyses Jarred M.D.   On: 05/15/2021 01:57   CT ABDOMEN PELVIS W CONTRAST  Result Date: 05/15/2021 CLINICAL DATA:  Abnormal left hilar soft tissue noted on chest CTA. This was concerning for malignancy. Additional evaluation to assess for possible metastatic disease. EXAM: CT ABDOMEN AND PELVIS WITH CONTRAST TECHNIQUE: Multidetector CT imaging of the abdomen and pelvis was performed using the standard protocol following bolus administration of intravenous contrast. CONTRAST:  92mL OMNIPAQUE IOHEXOL 350 MG/ML SOLN COMPARISON:  Current chest CTA. FINDINGS: Lower chest: Small right effusion. Left lung base subcentimeter nodules. Small patchy areas of ground-glass opacity at the left lung base. Pericardial effusion. No change from the earlier chest CTA. Hepatobiliary: Subtle liver lesions suspicious for metastatic disease. Largest lies in the right lobe, segment 6, 1.4 cm in size. Liver normal in size and overall attenuation. Mildly distended gallbladder. Increased attenuation material in the dependent gallbladder possibly small stones or sludge. No wall thickening or inflammation. No bile duct dilation. Pancreas: Unremarkable. No pancreatic ductal dilatation or surrounding inflammatory changes. Spleen: Normal in size without focal abnormality. Adrenals/Urinary Tract: Bilateral adrenal masses, right 2.1 x 1.4 cm, left 1.1 cm size. Symmetric renal enhancement and  excretion. No renal masses, stones or hydronephrosis. Normal ureters. Bladder minimally distended. Wall appears thickened. No bladder mass. Stomach/Bowel: Normal stomach. Small bowel and colon are normal in caliber. No wall thickening. No inflammation. Normal appendix suggested. No evidence of appendicitis. Vascular/Lymphatic: Mild aortic atherosclerosis.  No aneurysm. Prominent lymph nodes above the celiac axis, largest 1.2 cm in short axis. Prominent but subcentimeter retroperitoneal lymph nodes. No other enlarged nodes. Reproductive: Significant prostate enlargement elevating the bladder base. Prostate measures 6.9 cm superior to inferior by 5.8 x 5.6 cm transversely. Other: No abdominal wall hernia or abnormality. No abdominopelvic ascites. Musculoskeletal: Small area of sclerosis in the right ilium, likely a bone island. More definitive bone island noted in the anterior left femoral head. Vague sclerotic lesion along the lower endplate of T9. No osteolytic lesions. No fracture or acute finding. IMPRESSION: 1. Subtle low-density liver lesions consistent with metastatic disease. 2. Bilateral adrenal masses suspicious for metastatic disease. 3. Prominent lymph nodes above the celiac axis suspicious for metastatic disease. 4. Subtle areas of sclerosis, right ilium and lower endplate of T9, nonspecific, possibly metastatic disease. 5. No acute findings. Electronically Signed   By: Lajean Manes M.D.   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 All questions answered   Corrin Parker, M.D.  [facility]  Medical Director [place] Director [facility] Cardio-Pulmonary Department

## 2021-05-16 NOTE — Plan of Care (Signed)
No acute events during the night. Oxygen saturations maintained WNL on 3.5 L O2 via [place]. Coughed up blood once last night. Declined brain MRI last night due to claustrophobia. Would prefer to have something to help him relax. States that he may be willing to have it completed today. Dr. Damita Dunnings is aware of this. NPO for possible bronchoscopy. MRSA screening negative.  Problem: Education: Goal: Knowledge of General Education information will improve Description: Including pain rating scale, medication(s)/side effects and non-pharmacologic comfort measures Outcome: Progressing   Problem: Health Behavior/Discharge Planning: Goal: Ability to manage health-related needs will improve Outcome: Progressing   Problem: Clinical Measurements: Goal: Ability to maintain clinical measurements within normal limits will improve Outcome: Progressing Goal: Will remain free from infection Outcome: Progressing Goal: Diagnostic test results will improve Outcome: Progressing Goal: Respiratory complications will improve Outcome: Progressing Goal: Cardiovascular complication will be avoided Outcome: Progressing   Problem: Activity: Goal: Risk for activity intolerance will decrease Outcome: Progressing   Problem: Nutrition: Goal: Adequate nutrition will be maintained Outcome: Progressing   Problem: Coping: Goal: Level of anxiety will decrease Outcome: Progressing   Problem: Elimination: Goal: Will not experience complications related to bowel motility Outcome: Progressing Goal: Will not experience complications related to urinary retention Outcome: Progressing   Problem: Pain Managment: Goal: General experience of comfort will improve Outcome: Progressing   Problem: Safety: Goal: Ability to remain free from injury will improve Outcome: Progressing   Problem: Skin Integrity: Goal: Risk for impaired skin integrity will decrease Outcome: Progressing

## 2021-05-16 NOTE — Progress Notes (Signed)
PROGRESS NOTE    Derek Clarke   RXV:[id number]  DOB: 26-Sep-1955  DOA: 05/14/2021 PCP: Merryl Hacker, No   Brief Narrative:  Derek Clarke is a 65 year old male who is a smoker and has no PCP.  The patient states that he has had a cough and shortness of breath for 2-3 months and noticed that he was coughing up blood 3 to 4 days ago.  He is not able to lay flat due to shortness of breath and also states that he is short of breath while sitting up. CTA chest: Bulky abnormal soft tissue in the left hilum encasing the main left pulmonary artery and severely narrowing of the proximal lobar branches. 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 Bilateral adrenal nodules measuring 2 x 1.3 cm.  The right nodule is concerning for metastatic disease.  The left nodule may be an adenoma.  Small pericardial and small pleural effusions.  Consolidation in the lingula may be postobstructive.r   He admits to smoking 1 pack/day but states that he stopped smoking about a month ago.  He has been taking aspirin for chest pain up to 2 or 3 a day and last took an aspirin yesterday.  He is admitted for further work-up  8/22: Pulmonary not planning any bronchoscopy while here and recommends to continue antibiotic treatment   Subjective: No further hemoptysis.  Still very short of breath.  Dyspnea at rest and with minimal exertion.  Coughing also.  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  Continue IV steroids 40 mg twice daily for now - Pulmonary recommends to continue antibiotics for treatment of pneumonia and outpatient CT in 4 to 6 weeks -Start IV Rocephin and Zithromax for now -CT abdomen pelvis shows bilateral adrenal masses, right ilium and T9 mets and prominent  lymph nodes in the celiac axis worrisome for metastatic disease -We will consult oncology.  Dr. Grayland Ormond aware. MRI of the brain and liver if patient is able to lie flat.  I have ordered Ativan as patient is claustrophobic for MRI  Active Problems:    Acute respiratory failure with hypoxia  -His pulse ox is 88% on room air at rest - Continue oxygen to keep pulse ox in the 90s.  He is requiring 3.5 L oxygen for now -Wean as tolerated  Hypokalemia Replete and recheck    Nicotine dependence -He admits that he was smoking up to 1 pack/day for many years and states he stopped about 1 month ago   Time spent in minutes: 35 DVT prophylaxis: SCDs Start: 05/15/21 0308  Code Status: full  Family Communication: No family listed Level of Care: Level of care: Med-Surg Disposition Plan: Home once clinically improves Status is: Inpatient  Remains inpatient appropriate because:Inpatient level of care appropriate due to severity of illness  Dispo: The patient is from: Home              Anticipated d/c is to: Home              Patient currently is not medically stable to d/c.  Will need improvement in his respiratory status.  Acutely requiring 3.5 L oxygen for now   Difficult to place patient Yes    Consultants:  Pulmonary Oncology Procedures:  none Antimicrobials:  Anti-infectives (From admission, onward)  Start     Dose/Rate Route Frequency Ordered Stop   05/15/21 1300  cefTRIAXone (ROCEPHIN) 1 g in sodium chloride 0.9 % 100 mL IVPB        1 g 200 mL/hr over 30 Minutes Intravenous  Once 05/15/21 1256 05/15/21 1457   05/15/21 0315  cefTRIAXone (ROCEPHIN) 2 g in sodium chloride 0.9 % 100 mL IVPB  Status:  Discontinued        2 g 200 mL/hr over 30 Minutes Intravenous Every 24 hours 05/15/21 0309 05/15/21 0314   05/15/21 0315  azithromycin (ZITHROMAX) 500 mg in sodium chloride 0.9 % 250 mL IVPB  Status:  Discontinued        500 mg 250 mL/hr over 60 Minutes Intravenous Every 24 hours  05/15/21 0309 05/15/21 0314   05/14/21 2330  cefTRIAXone (ROCEPHIN) 1 g in sodium chloride 0.9 % 100 mL IVPB        1 g 200 mL/hr over 30 Minutes Intravenous  Once 05/14/21 2329 05/15/21 0033   05/14/21 2330  azithromycin (ZITHROMAX) 500 mg in sodium chloride 0.9 % 250 mL IVPB        500 mg 250 mL/hr over 60 Minutes Intravenous  Once 05/14/21 2329 05/15/21 0153        Objective: Vitals:   05/16/21 0456 05/16/21 0758 05/16/21 0843 05/16/21 1228  BP: 133/74  (!) 138/93 (!) 143/110  Pulse: 72 88 81 87  Resp: (!) 21 20 17 17   Temp: 97.8 F (36.6 C)  97.6 F (36.4 C) 98.6 F (37 C)  TempSrc:      SpO2: 97% 98% 97% 92%  Weight:      Height:        Intake/Output Summary (Last 24 hours) at 05/16/2021 1338 Last data filed at 05/15/2021 1457 Gross per 24 hour  Intake 100 ml  Output --  Net 100 ml   Filed Weights   05/14/21 2243  Weight: 102.1 kg    Examination: General exam: Appears anxious - coughing yellowish phlegm HEENT: PERRLA, oral mucosa moist, no sclera icterus or thrush Respiratory system: Rhonchi at both bases.  Mild expiratory wheezing. Cardiovascular system: S1 & S2 heard, RRR.   Gastrointestinal system: Abdomen soft, non-tender, nondistended. Normal bowel sounds. Central nervous system: Alert and oriented. No focal neurological deficits. Extremities: No cyanosis, clubbing.  Trace bilateral lower extremity edema Skin: No rashes or ulcers Psychiatry:  anxious     Data Reviewed: I have personally reviewed following labs and imaging studies  CBC: Recent Labs  Lab 05/14/21 2306  WBC 10.5  NEUTROABS 8.4*  HGB 14.4  HCT 42.6  MCV 85.4  PLT 673   Basic Metabolic Panel: Recent Labs  Lab 05/14/21 2306  NA 138  K 3.4*  CL 99  CO2 29  GLUCOSE 108*  BUN 12  CREATININE 0.58*  CALCIUM 9.0   GFR: Estimated Creatinine Clearance: 109.9 mL/min (A) (by C-G formula based on SCr of 0.58 mg/dL (L)). Liver Function Tests: Recent Labs  Lab 05/14/21 2306   AST 30  ALT 33  ALKPHOS 100  BILITOT 1.1  PROT 6.7  ALBUMIN 3.7   No results for input(s): LIPASE, AMYLASE in the last 168 hours. No results for input(s): AMMONIA in the last 168 hours. Coagulation Profile: No results for input(s): INR, PROTIME in the last 168 hours. Cardiac Enzymes: No results for input(s): CKTOTAL, CKMB, CKMBINDEX, TROPONINI in the last 168 hours. BNP (last 3 results) No results for input(s): PROBNP in the last 8760 hours.  HbA1C: No results for input(s): HGBA1C in the last 72 hours. CBG: No results for input(s): GLUCAP in the last 168 hours. Lipid Profile: No results for input(s): CHOL, HDL, LDLCALC, TRIG, CHOLHDL, LDLDIRECT in the last 72 hours. Thyroid Function Tests: No results for input(s): TSH, T4TOTAL, FREET4, T3FREE, THYROIDAB in the last 72 hours. Anemia Panel: No results for input(s): VITAMINB12, FOLATE, FERRITIN, TIBC, IRON, RETICCTPCT in the last 72 hours. Urine analysis: No results found for: COLORURINE, APPEARANCEUR, LABSPEC, PHURINE, GLUCOSEU, HGBUR, BILIRUBINUR, KETONESUR, PROTEINUR, UROBILINOGEN, NITRITE, LEUKOCYTESUR Sepsis Labs: @LABRCNTIP (procalcitonin:4,lacticidven:4) ) Recent Results (from the past 240 hour(s))  Resp Panel by RT-PCR (Flu A&B, Covid) Nasopharyngeal Swab     Status: None   Collection Time: 05/14/21 11:06 PM   Specimen: Nasopharyngeal Swab; Nasopharyngeal(NP) swabs in vial transport medium  Result Value Ref Range Status   SARS Coronavirus 2 by RT PCR NEGATIVE NEGATIVE Final    Comment: (NOTE) SARS-CoV-2 target nucleic acids are NOT DETECTED.  The SARS-CoV-2 RNA is generally detectable in upper respiratory specimens during the acute phase of infection. The lowest concentration of SARS-CoV-2 viral copies this assay can detect is 138 copies/mL. A negative result does not preclude SARS-Cov-2 infection and should not be used as the sole basis for treatment or other patient management decisions. A negative result may occur  with  improper specimen collection/handling, submission of specimen other than nasopharyngeal swab, presence of viral mutation(s) within the areas targeted by this assay, and inadequate number of viral copies(<138 copies/mL). A negative result must be combined with clinical observations, patient history, and epidemiological information. The expected result is Negative.  Fact Sheet for Patients:  EntrepreneurPulse.com.au  Fact Sheet for Healthcare Providers:  IncredibleEmployment.be  This test is no t yet approved or cleared by the [place] FDA and  has been authorized for detection and/or diagnosis of SARS-CoV-2 by FDA under an Emergency Use Authorization (EUA). This EUA will remain  in effect (meaning this test can be used) for the duration of the COVID-19 declaration under Section 564(b)(1) of the Act, 21 U.S.C.section 360bbb-3(b)(1), unless the authorization is terminated  or revoked sooner.       Influenza A by PCR NEGATIVE NEGATIVE Final   Influenza B by PCR NEGATIVE NEGATIVE Final    Comment: (NOTE) The Xpert Xpress SARS-CoV-2/FLU/RSV plus assay is intended as an aid in the diagnosis of influenza from Nasopharyngeal swab specimens and should not be used as a sole basis for treatment. Nasal washings and aspirates are unacceptable for Xpert Xpress SARS-CoV-2/FLU/RSV testing.  Fact Sheet for Patients: EntrepreneurPulse.com.au  Fact Sheet for Healthcare Providers: IncredibleEmployment.be  This test is not yet approved or cleared by the [place] FDA and has been authorized for detection and/or diagnosis of SARS-CoV-2 by FDA under an Emergency Use Authorization (EUA). This EUA will remain in effect (meaning this test can be used) for the duration of the COVID-19 declaration under Section 564(b)(1) of the Act, 21 U.S.C. section 360bbb-3(b)(1), unless the authorization is terminated  or revoked.  Performed at [facility], [address] [place], [place] [address]   Culture, blood (routine x 2)     Status: None (Preliminary result)   Collection Time: 05/15/21 12:07 AM   Specimen: BLOOD  Result Value Ref Range Status   Specimen Description BLOOD RIGHT FOREARM  Final   Special Requests   Final    BOTTLES DRAWN AEROBIC AND ANAEROBIC Blood Culture results may not be optimal due to an excessive volume of blood received in culture bottles  Culture   Final    NO GROWTH 1 DAY Performed at [facility], [address] [place], [place] [address]    Report Status PENDING  Incomplete  Culture, blood (routine x 2)     Status: None (Preliminary result)   Collection Time: 05/15/21 12:07 AM   Specimen: BLOOD  Result Value Ref Range Status   Specimen Description BLOOD LEFT FOREARM  Final   Special Requests   Final    BOTTLES DRAWN AEROBIC AND ANAEROBIC Blood Culture results may not be optimal due to an excessive volume of blood received in culture bottles   Culture   Final    NO GROWTH 1 DAY Performed at [facility], [address], [place], [place] [address]    Report Status PENDING  Incomplete  MRSA Next Gen by PCR, Nasal     Status: None   Collection Time: 05/15/21 11:36 PM   Specimen: Nasal Mucosa; Nasal Swab  Result Value Ref Range Status   MRSA by PCR Next Gen NOT DETECTED NOT DETECTED Final    Comment: (NOTE) The GeneXpert MRSA Assay (FDA approved for NASAL specimens only), is one component of a comprehensive MRSA colonization surveillance program. It is not intended to diagnose MRSA infection nor to guide or monitor treatment for MRSA infections. Test performance is not FDA approved in patients less than 64 years old. Performed at [facility], [address] [place], [place] [address]          Radiology Studies: DG Chest 1 View  Result Date: 05/14/2021 CLINICAL DATA:  Shortness of breath for couple of  months. EXAM: CHEST  1 VIEW COMPARISON:  None. FINDINGS: Mild cardiac enlargement. Perihilar infiltrates bilaterally may represent edema or multifocal pneumonia. No pleural effusions. No pneumothorax. Mediastinal contours appear intact. IMPRESSION: Bilateral perihilar infiltrates. Electronically Signed   By: Lucienne Capers M.D.   On: 05/14/2021 23:11   CT Angio Chest PE W/Cm &/Or Wo Cm  Result Date: 05/15/2021 CLINICAL DATA:  Hemoptysis EXAM: CT ANGIOGRAPHY CHEST WITH CONTRAST TECHNIQUE: Multidetector CT imaging of the chest was performed using the standard protocol during bolus administration of intravenous contrast. Multiplanar CT image reconstructions and MIPs were obtained to evaluate the vascular anatomy. CONTRAST:  167mL OMNIPAQUE IOHEXOL 350 MG/ML SOLN COMPARISON:  None. FINDINGS: Cardiovascular: Contrast injection is sufficient to demonstrate satisfactory opacification of the pulmonary arteries to the segmental level. There is no pulmonary embolus or evidence of right heart strain. The size of the main pulmonary artery is normal. Proximal left pulmonary arteries are severely narrowed by confluent soft tissue surrounding the left hilum. The course and caliber of the aorta are normal. There is atherosclerotic calcification. Opacification decreased due to pulmonary arterial phase contrast bolus timing. There is a small pericardial effusion. Mediastinum/Nodes: There is bulky abnormal mediastinal soft tissue with in case mint of the left common carotid artery and proximal left subclavian artery. There are multiple enlarged mediastinal lymph nodes. Level 4 are node measures 10 mm. Lungs/Pleura: There is confluent soft tissue at the left hilum encasing the main left pulmonary artery and severely narrowing the proximal lobar branches. Abnormal soft tissue extends in a peribronchial pattern. There is also abnormal soft tissue at the right hilum. There is atelectasis within the right middle lobe and an area of  consolidation in the lingula. Small pleural effusions. Upper Abdomen: Contrast bolus timing is not optimized for evaluation of the abdominal organs. There is an intermediate density 2.0 x 1.3 cm right adrenal nodule.  There is a lower density left adrenal nodule measuring 1.5 cm. Musculoskeletal: No chest wall abnormality. No bony spinal canal stenosis. Review of the MIP images confirms the above findings. IMPRESSION: 1. No pulmonary embolus or acute aortic syndrome. 2. Bulky abnormal soft tissue at the left hilum encasing the main left pulmonary artery and severely narrowing the proximal lobar branches, consistent with malignancy. 3. Multiple enlarged mediastinal lymph nodes, consistent with lymphatic metastatic disease. 4. Bilateral adrenal nodules, measuring up to 2.0 x 1.3 cm. The right adrenal nodule is concerning for metastatic disease. The left may be an adenoma. 5. Small pericardial effusion and small pleural effusions. 6. Consolidation in the lingula may be postobstructive. Aortic atherosclerosis (ICD10-I70.0). Electronically Signed   By: Ulyses Jarred M.D.   On: 05/15/2021 01:57   CT ABDOMEN PELVIS W CONTRAST  Result Date: 05/15/2021 CLINICAL DATA:  Abnormal left hilar soft tissue noted on chest CTA. This was concerning for malignancy. Additional evaluation to assess for possible metastatic disease. EXAM: CT ABDOMEN AND PELVIS WITH CONTRAST TECHNIQUE: Multidetector CT imaging of the abdomen and pelvis was performed using the standard protocol following bolus administration of intravenous contrast. CONTRAST:  32mL OMNIPAQUE IOHEXOL 350 MG/ML SOLN COMPARISON:  Current chest CTA. FINDINGS: Lower chest: Small right effusion. Left lung base subcentimeter nodules. Small patchy areas of ground-glass opacity at the left lung base. Pericardial effusion. No change from the earlier chest CTA. Hepatobiliary: Subtle liver lesions suspicious for metastatic disease. Largest lies in the right lobe, segment 6, 1.4 cm in  size. Liver normal in size and overall attenuation. Mildly distended gallbladder. Increased attenuation material in the dependent gallbladder possibly small stones or sludge. No wall thickening or inflammation. No bile duct dilation. Pancreas: Unremarkable. No pancreatic ductal dilatation or surrounding inflammatory changes. Spleen: Normal in size without focal abnormality. Adrenals/Urinary Tract: Bilateral adrenal masses, right 2.1 x 1.4 cm, left 1.1 cm size. Symmetric renal enhancement and excretion. No renal masses, stones or hydronephrosis. Normal ureters. Bladder minimally distended. Wall appears thickened. No bladder mass. Stomach/Bowel: Normal stomach. Small bowel and colon are normal in caliber. No wall thickening. No inflammation. Normal appendix suggested. No evidence of appendicitis. Vascular/Lymphatic: Mild aortic atherosclerosis.  No aneurysm. Prominent lymph nodes above the celiac axis, largest 1.2 cm in short axis. Prominent but subcentimeter retroperitoneal lymph nodes. No other enlarged nodes. Reproductive: Significant prostate enlargement elevating the bladder base. Prostate measures 6.9 cm superior to inferior by 5.8 x 5.6 cm transversely. Other: No abdominal wall hernia or abnormality. No abdominopelvic ascites. Musculoskeletal: Small area of sclerosis in the right ilium, likely a bone island. More definitive bone island noted in the anterior left femoral head. Vague sclerotic lesion along the lower endplate of T9. No osteolytic lesions. No fracture or acute finding. IMPRESSION: 1. Subtle low-density liver lesions consistent with metastatic disease. 2. Bilateral adrenal masses suspicious for metastatic disease. 3. Prominent lymph nodes above the celiac axis suspicious for metastatic disease. 4. Subtle areas of sclerosis, right ilium and lower endplate of T9, nonspecific, possibly metastatic disease. 5. No acute findings. Electronically Signed   By: Lajean Manes M.D.   On: 05/15/2021 17:56       Scheduled Meds:  budesonide (PULMICORT) nebulizer solution  0.5 mg Nebulization BID   [START ON 05/17/2021] COVID-19 mRNA vaccine (Moderna)  0.25 mL Intramuscular ONCE-1600   feeding supplement  237 mL Oral BID BM   guaiFENesin  600 mg Oral BID   ipratropium-albuterol  3 mL Nebulization Q4H   methylPREDNISolone (SOLU-MEDROL) injection  40 mg Intravenous Q12H   multivitamin with minerals  1 tablet Oral Daily   Continuous Infusions:   LOS: 1 day      Max Sane, MD [facility] Pager: [email or web address] 05/16/2021, 1:38 PM

## 2021-05-16 NOTE — TOC Progression Note (Signed)
Transition of Care [facility]) - Progression Note    Patient Details  Name: Derek Clarke MRN: [id number] Date of Birth: 1955-12-11  Transition of Care [facility]) CM/SW [place], RN Phone Number: 05/16/2021, 4:22 PM  Clinical Narrative:  Patient lives alone, has very little assistance.  He states that he is planning to live with his brother in [place], but the logistics have not been finalized.    Patient states when he is at home, he has a Pensions consultant, but his vehicles are both in [place].  He is afraid to drive at this.  He states he has not had any recent appointments to transport to, but he would probably uber when he can, but finances are an issue.  He states that as far as obtaining his medications, he orders some over [place] to be delivered.  He states he feels comfortable driving 3 miles to the pharmacy occasionally for other meds.  He believes that he takes his medications as directed but he is not completely sure.  He says that he has not had a new prescription in a long time.  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  He states that he is able to cook small meals but can use a frying pan and microwave when needed.  Patient would like to be able to move near his brother, but he is not sure how long that will take.  He would like to return home but he states that he does not feel well enough to return home at this point.  He would consider a SNF if he is eligible.  TOC contact information given, TOC to follow to discharge.          Expected Discharge Plan and Services                                                 Social Determinants of Health (SDOH) Interventions    Readmission Risk Interventions No flowsheet data found.

## 2021-05-17 ENCOUNTER — Inpatient Hospital Stay: Payer: Non-veteran care

## 2021-05-17 LAB — CBC
HCT: 44.8 % (ref 39.0–52.0)
Hemoglobin: 14.8 g/dL (ref 13.0–17.0)
MCH: 28.3 pg (ref 26.0–34.0)
MCHC: 33 g/dL (ref 30.0–36.0)
MCV: 85.7 fL (ref 80.0–100.0)
Platelets: 209 10*3/uL (ref 150–400)
RBC: 5.23 MIL/uL (ref 4.22–5.81)
RDW: 12.7 % (ref 11.5–15.5)
WBC: 15 10*3/uL — ABNORMAL HIGH (ref 4.0–10.5)
nRBC: 0 % (ref 0.0–0.2)

## 2021-05-17 LAB — BASIC METABOLIC PANEL
Anion gap: 12 (ref 5–15)
BUN: 18 mg/dL (ref 8–23)
CO2: 27 mmol/L (ref 22–32)
Calcium: 9.3 mg/dL (ref 8.9–10.3)
Chloride: 98 mmol/L (ref 98–111)
Creatinine, Ser: 0.6 mg/dL — ABNORMAL LOW (ref 0.61–1.24)
GFR, Estimated: >60 mL/min (ref >60)
Glucose, Bld: 153 mg/dL — ABNORMAL HIGH (ref 70–99)
Potassium: 4 mmol/L (ref 3.5–5.1)
Sodium: 137 mmol/L (ref 135–145)

## 2021-05-17 MED ORDER — IPRATROPIUM-ALBUTEROL 0.5-2.5 (3) MG/3ML IN SOLN
3.0000 mL | Freq: Four times a day (QID) | RESPIRATORY_TRACT | Status: DC
  Administered 2021-05-17 – 2021-05-22 (×22): 3 mL via RESPIRATORY_TRACT
  Filled 2021-05-17 (×22): qty 3

## 2021-05-17 MED ORDER — LORAZEPAM 1 MG PO TABS
1.0000 mg | ORAL_TABLET | Freq: Once | ORAL | Status: AC
  Administered 2021-05-18: 1 mg via ORAL
  Filled 2021-05-17: qty 1

## 2021-05-17 MED ORDER — IBUPROFEN 400 MG PO TABS
400.0000 mg | ORAL_TABLET | Freq: Four times a day (QID) | ORAL | Status: DC | PRN
  Administered 2021-05-17: 400 mg via ORAL
  Filled 2021-05-17 (×2): qty 1

## 2021-05-17 NOTE — Progress Notes (Signed)
PROGRESS NOTE    Derek Clarke   NAT:[id number]  DOB: April 11, 1956  DOA: 05/14/2021 PCP: Merryl Hacker, No   Brief Narrative:  Derek Clarke is a 65 year old male who is a smoker and has no PCP.  The patient states that he has had a cough and shortness of breath for 2-3 months and noticed that he was coughing up blood 3 to 4 days ago.  He is not able to lay flat due to shortness of breath and also states that he is short of breath while sitting up. CTA chest: Bulky abnormal soft tissue in the left hilum encasing the main left pulmonary artery and severely narrowing of the proximal lobar branches. 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 Bilateral adrenal nodules measuring 2 x 1.3 cm.  The right nodule is concerning for metastatic disease.  The left nodule may be an adenoma.  Small pericardial and small pleural effusions.  Consolidation in the lingula may be postobstructive.   He admits to smoking 1 pack/day but states that he stopped smoking about a month ago.  He has been taking aspirin for chest pain up to 2 or 3 a day and last took an aspirin yesterday.  He is admitted for further work-up  8/22: Pulmonary not planning any bronchoscopy while here and recommends to continue antibiotic treatment.  Oncology was also consulted for concern of metastatic disease, MRI brain and liver was ordered-still pending.  They are recommending biopsy if needed.  Subjective: Patient remained quite short of breath and having cough.  Discussed about getting MRI and possible biopsy.  Per patient he does not think that he can physically tolerate the biopsy, agree to try for MRI with Ativan.  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  Continue IV steroids 40 mg twice daily for now - Pulmonary  recommends to continue antibiotics for treatment of pneumonia and outpatient CT in 4 to 6 weeks -Continue IV Rocephin and Zithromax as recommended by pulmonology as pneumonia less likely, negative procalcitonin. -CT abdomen pelvis shows bilateral adrenal masses, right ilium and T9 mets and prominent lymph nodes in the celiac axis worrisome for metastatic disease -Oncology was consulted. MRI of the brain and liver if patient is able to lie flat.  -Will need PET scan as an outpatient.  They are also recommending doing a biopsy if those lesions confirmed on MRI.  Active Problems:    Acute respiratory failure with hypoxia  -His pulse ox is 88% on room air at rest - Continue oxygen to keep pulse ox in the 90s.  He is requiring 3.5 L oxygen for now -Wean as tolerated  Hypokalemia.  Resolved. Replete and recheck    Nicotine dependence -He admits that he was smoking up to 1 pack/day for many years and states he stopped about 1 month ago  DVT prophylaxis: SCDs Start: 05/15/21 0308  Code Status: full  Family Communication: No family listed Level of Care: Level of care: Med-Surg Disposition Plan: Home once clinically improves Status is: Inpatient  Remains inpatient appropriate because:Inpatient level of care appropriate due to severity of illness  Dispo: The patient is from: Home              Anticipated d/c is to: Home              Patient currently is  not medically stable to d/c.  Will need improvement in his respiratory status.  Acutely requiring 3.5 L oxygen for now   Difficult to place patient Yes    Consultants:  Pulmonary Oncology Procedures:  none Antimicrobials:  Anti-infectives (From admission, onward)    Start     Dose/Rate Route Frequency Ordered Stop   05/17/21 1000  azithromycin (ZITHROMAX) tablet 250 mg       See Hyperspace for full Linked Orders Report.   250 mg Oral Daily 05/16/21 1339 05/21/21 0959   05/16/21 1500  cefTRIAXone (ROCEPHIN) 1 g in sodium chloride  0.9 % 100 mL IVPB        1 g 200 mL/hr over 30 Minutes Intravenous Every 24 hours 05/16/21 1339     05/16/21 1430  azithromycin (ZITHROMAX) tablet 500 mg       See Hyperspace for full Linked Orders Report.   500 mg Oral Daily 05/16/21 1339 05/16/21 1542   05/15/21 1300  cefTRIAXone (ROCEPHIN) 1 g in sodium chloride 0.9 % 100 mL IVPB        1 g 200 mL/hr over 30 Minutes Intravenous  Once 05/15/21 1256 05/15/21 1457   05/15/21 0315  cefTRIAXone (ROCEPHIN) 2 g in sodium chloride 0.9 % 100 mL IVPB  Status:  Discontinued        2 g 200 mL/hr over 30 Minutes Intravenous Every 24 hours 05/15/21 0309 05/15/21 0314   05/15/21 0315  azithromycin (ZITHROMAX) 500 mg in sodium chloride 0.9 % 250 mL IVPB  Status:  Discontinued        500 mg 250 mL/hr over 60 Minutes Intravenous Every 24 hours 05/15/21 0309 05/15/21 0314   05/14/21 2330  cefTRIAXone (ROCEPHIN) 1 g in sodium chloride 0.9 % 100 mL IVPB        1 g 200 mL/hr over 30 Minutes Intravenous  Once 05/14/21 2329 05/15/21 0033   05/14/21 2330  azithromycin (ZITHROMAX) 500 mg in sodium chloride 0.9 % 250 mL IVPB        500 mg 250 mL/hr over 60 Minutes Intravenous  Once 05/14/21 2329 05/15/21 0153        Objective: Vitals:   05/17/21 0405 05/17/21 0812 05/17/21 1206 05/17/21 1541  BP: (!) 152/87 (!) 136/100 (!) 144/88 128/85  Pulse: (!) 103 89 90 (!) 105  Resp: 16 15 15 15   Temp: 98.3 F (36.8 C) 98.2 F (36.8 C) 98.1 F (36.7 C) 98.4 F (36.9 C)  TempSrc:      SpO2: 94% 97% 93% 95%  Weight:      Height:        Intake/Output Summary (Last 24 hours) at 05/17/2021 1650 Last data filed at 05/16/2021 2000 Gross per 24 hour  Intake 100 ml  Output 500 ml  Net -400 ml    Filed Weights   05/14/21 2243 05/16/21 1639  Weight: 102.1 kg 106.1 kg    Examination: General.  Anxious looking gentleman, in no acute distress. Pulmonary.  Mildly decreased breath sounds at left base, normal respiratory effort. CV.  Regular rate and rhythm, no  JVD, rub or murmur. Abdomen.  Soft, nontender, nondistended, BS positive. CNS.  Alert and oriented x3.  No focal neurologic deficit. Extremities.  No edema, no cyanosis, pulses intact and symmetrical. Psychiatry.  Judgment and insight appears normal.    Data Reviewed: I have personally reviewed following labs and imaging studies  CBC: Recent Labs  Lab 05/14/21 2306 05/17/21 0308  WBC 10.5 15.0*  NEUTROABS 8.4*  --  HGB 14.4 14.8  HCT 42.6 44.8  MCV 85.4 85.7  PLT 185 947    Basic Metabolic Panel: Recent Labs  Lab 05/14/21 2306 05/17/21 0308  NA 138 137  K 3.4* 4.0  CL 99 98  CO2 29 27  GLUCOSE 108* 153*  BUN 12 18  CREATININE 0.58* 0.60*  CALCIUM 9.0 9.3    GFR: Estimated Creatinine Clearance: 112 mL/min (A) (by C-G formula based on SCr of 0.6 mg/dL (L)). Liver Function Tests: Recent Labs  Lab 05/14/21 2306  AST 30  ALT 33  ALKPHOS 100  BILITOT 1.1  PROT 6.7  ALBUMIN 3.7    No results for input(s): LIPASE, AMYLASE in the last 168 hours. No results for input(s): AMMONIA in the last 168 hours. Coagulation Profile: No results for input(s): INR, PROTIME in the last 168 hours. Cardiac Enzymes: No results for input(s): CKTOTAL, CKMB, CKMBINDEX, TROPONINI in the last 168 hours. BNP (last 3 results) No results for input(s): PROBNP in the last 8760 hours. HbA1C: No results for input(s): HGBA1C in the last 72 hours. CBG: No results for input(s): GLUCAP in the last 168 hours. Lipid Profile: No results for input(s): CHOL, HDL, LDLCALC, TRIG, CHOLHDL, LDLDIRECT in the last 72 hours. Thyroid Function Tests: No results for input(s): TSH, T4TOTAL, FREET4, T3FREE, THYROIDAB in the last 72 hours. Anemia Panel: No results for input(s): VITAMINB12, FOLATE, FERRITIN, TIBC, IRON, RETICCTPCT in the last 72 hours. Urine analysis: No results found for: COLORURINE, APPEARANCEUR, LABSPEC, PHURINE, GLUCOSEU, HGBUR, BILIRUBINUR, KETONESUR, PROTEINUR, UROBILINOGEN, NITRITE,  LEUKOCYTESUR Sepsis Labs: @LABRCNTIP (procalcitonin:4,lacticidven:4) ) Recent Results (from the past 240 hour(s))  Resp Panel by RT-PCR (Flu A&B, Covid) Nasopharyngeal Swab     Status: None   Collection Time: 05/14/21 11:06 PM   Specimen: Nasopharyngeal Swab; Nasopharyngeal(NP) swabs in vial transport medium  Result Value Ref Range Status   SARS Coronavirus 2 by RT PCR NEGATIVE NEGATIVE Final    Comment: (NOTE) SARS-CoV-2 target nucleic acids are NOT DETECTED.  The SARS-CoV-2 RNA is generally detectable in upper respiratory specimens during the acute phase of infection. The lowest concentration of SARS-CoV-2 viral copies this assay can detect is 138 copies/mL. A negative result does not preclude SARS-Cov-2 infection and should not be used as the sole basis for treatment or other patient management decisions. A negative result may occur with  improper specimen collection/handling, submission of specimen other than nasopharyngeal swab, presence of viral mutation(s) within the areas targeted by this assay, and inadequate number of viral copies(<138 copies/mL). A negative result must be combined with clinical observations, patient history, and epidemiological information. The expected result is Negative.  Fact Sheet for Patients:  EntrepreneurPulse.com.au  Fact Sheet for Healthcare Providers:  IncredibleEmployment.be  This test is no t yet approved or cleared by the [place] FDA and  has been authorized for detection and/or diagnosis of SARS-CoV-2 by FDA under an Emergency Use Authorization (EUA). This EUA will remain  in effect (meaning this test can be used) for the duration of the COVID-19 declaration under Section 564(b)(1) of the Act, 21 U.S.C.section 360bbb-3(b)(1), unless the authorization is terminated  or revoked sooner.       Influenza A by PCR NEGATIVE NEGATIVE Final   Influenza B by PCR NEGATIVE NEGATIVE Final    Comment:  (NOTE) The Xpert Xpress SARS-CoV-2/FLU/RSV plus assay is intended as an aid in the diagnosis of influenza from Nasopharyngeal swab specimens and should not be used as a sole basis for treatment. Nasal washings and aspirates are  unacceptable for Xpert Xpress SARS-CoV-2/FLU/RSV testing.  Fact Sheet for Patients: EntrepreneurPulse.com.au  Fact Sheet for Healthcare Providers: IncredibleEmployment.be  This test is not yet approved or cleared by the [place] FDA and has been authorized for detection and/or diagnosis of SARS-CoV-2 by FDA under an Emergency Use Authorization (EUA). This EUA will remain in effect (meaning this test can be used) for the duration of the COVID-19 declaration under Section 564(b)(1) of the Act, 21 U.S.C. section 360bbb-3(b)(1), unless the authorization is terminated or revoked.  Performed at [facility], [address] [place], [place] [address]   Culture, blood (routine x 2)     Status: None (Preliminary result)   Collection Time: 05/15/21 12:07 AM   Specimen: BLOOD  Result Value Ref Range Status   Specimen Description BLOOD RIGHT FOREARM  Final   Special Requests   Final    BOTTLES DRAWN AEROBIC AND ANAEROBIC Blood Culture results may not be optimal due to an excessive volume of blood received in culture bottles   Culture   Final    NO GROWTH 2 DAYS Performed at [facility], [address], [place], [place] [address]    Report Status PENDING  Incomplete  Culture, blood (routine x 2)     Status: None (Preliminary result)   Collection Time: 05/15/21 12:07 AM   Specimen: BLOOD  Result Value Ref Range Status   Specimen Description BLOOD LEFT FOREARM  Final   Special Requests   Final    BOTTLES DRAWN AEROBIC AND ANAEROBIC Blood Culture results may not be optimal due to an excessive volume of blood received in culture bottles   Culture   Final    NO GROWTH 2 DAYS Performed at [facility], [address], [place], [place] [address]    Report Status PENDING  Incomplete  MRSA Next Gen by PCR, Nasal     Status: None   Collection Time: 05/15/21 11:36 PM   Specimen: Nasal Mucosa; Nasal Swab  Result Value Ref Range Status   MRSA by PCR Next Gen NOT DETECTED NOT DETECTED Final    Comment: (NOTE) The GeneXpert MRSA Assay (FDA approved for NASAL specimens only), is one component of a comprehensive MRSA colonization surveillance program. It is not intended to diagnose MRSA infection nor to guide or monitor treatment for MRSA infections. Test performance is not FDA approved in patients less than 61 years old. Performed at [facility], [address] [place],  [address]       Radiology Studies: CT ABDOMEN PELVIS W CONTRAST  Result Date: 05/15/2021 CLINICAL DATA:  Abnormal left hilar soft tissue noted on chest CTA. This was concerning for malignancy. Additional evaluation to assess for possible metastatic disease. EXAM: CT ABDOMEN AND PELVIS WITH CONTRAST TECHNIQUE: Multidetector CT imaging of the abdomen and pelvis was performed using the standard protocol following bolus administration of intravenous contrast. CONTRAST:  3mL OMNIPAQUE IOHEXOL 350 MG/ML SOLN COMPARISON:  Current chest CTA. FINDINGS: Lower chest: Small right effusion. Left lung base subcentimeter nodules. Small patchy areas of ground-glass opacity at the left lung base. Pericardial effusion. No change from the earlier chest CTA. Hepatobiliary: Subtle liver lesions suspicious for metastatic disease. Largest lies in the right lobe, segment 6, 1.4 cm in size. Liver normal in size and overall attenuation. Mildly distended gallbladder. Increased attenuation material in the dependent gallbladder possibly small stones or sludge. No wall thickening or inflammation. No bile duct dilation. Pancreas: Unremarkable. No pancreatic ductal dilatation or surrounding inflammatory  changes. Spleen: Normal in size  without focal abnormality. Adrenals/Urinary Tract: Bilateral adrenal masses, right 2.1 x 1.4 cm, left 1.1 cm size. Symmetric renal enhancement and excretion. No renal masses, stones or hydronephrosis. Normal ureters. Bladder minimally distended. Wall appears thickened. No bladder mass. Stomach/Bowel: Normal stomach. Small bowel and colon are normal in caliber. No wall thickening. No inflammation. Normal appendix suggested. No evidence of appendicitis. Vascular/Lymphatic: Mild aortic atherosclerosis.  No aneurysm. Prominent lymph nodes above the celiac axis, largest 1.2 cm in short axis. Prominent but subcentimeter retroperitoneal lymph nodes. No other enlarged nodes. Reproductive: Significant prostate enlargement elevating the bladder base. Prostate measures 6.9 cm superior to inferior by 5.8 x 5.6 cm transversely. Other: No abdominal wall hernia or abnormality. No abdominopelvic ascites. Musculoskeletal: Small area of sclerosis in the right ilium, likely a bone island. More definitive bone island noted in the anterior left femoral head. Vague sclerotic lesion along the lower endplate of T9. No osteolytic lesions. No fracture or acute finding. IMPRESSION: 1. Subtle low-density liver lesions consistent with metastatic disease. 2. Bilateral adrenal masses suspicious for metastatic disease. 3. Prominent lymph nodes above the celiac axis suspicious for metastatic disease. 4. Subtle areas of sclerosis, right ilium and lower endplate of T9, nonspecific, possibly metastatic disease. 5. No acute findings. Electronically Signed   By: Lajean Manes M.D.   On: 05/15/2021 17:56      Scheduled Meds:  azithromycin  250 mg Oral Daily   budesonide (PULMICORT) nebulizer solution  0.5 mg Nebulization BID   COVID-19 mRNA vaccine (Moderna)  0.25 mL Intramuscular ONCE-1600   feeding supplement  237 mL Oral BID BM   guaiFENesin  600 mg Oral BID   ipratropium-albuterol  3 mL Nebulization Q6H   methylPREDNISolone (SOLU-MEDROL)  injection  40 mg Intravenous Q12H   multivitamin with minerals  1 tablet Oral Daily   Continuous Infusions:  cefTRIAXone (ROCEPHIN)  IV 1 g (05/17/21 1518)     LOS: 2 days   Time spent.  35 minutes.  More than 50% of that time was spent in direct patient care and coordination of care.  Lorella Nimrod, MD [facility] Pager: [email or web address] 05/17/2021, 4:50 PM

## 2021-05-17 NOTE — Progress Notes (Signed)
Patient was in agreement with having Ativan and going down for MRI. Ativan given at 1305 and MRI stated would come up and take down. When MRI arrived, patient refused, stating he would like to wait until 1500. Explained need for MRI and that he was being worked into schedule, but still refused transport. MRI notified and they will attempt to work him into schedule. At this time, no time anticipated for MRI, therefore another dose of Ativan may be required when they do have availability. MD updated.

## 2021-05-18 ENCOUNTER — Inpatient Hospital Stay: Payer: Non-veteran care

## 2021-05-18 MED ORDER — GADOBUTROL 1 MMOL/ML IV SOLN
10.0000 mL | Freq: Once | INTRAVENOUS | Status: AC | PRN
  Administered 2021-05-18: 10 mL via INTRAVENOUS

## 2021-05-18 MED ORDER — PHENOL 1.4 % MT LIQD
1.0000 | OROMUCOSAL | Status: DC | PRN
  Administered 2021-05-18: 1 via OROMUCOSAL
  Filled 2021-05-18: qty 177

## 2021-05-18 NOTE — Progress Notes (Signed)
PROGRESS NOTE    MCLEAN MOYA   OYD:[id number]  DOB: 19-May-1956  DOA: 05/14/2021 PCP: Merryl Hacker, No   Brief Narrative:  Derek Clarke is a 65 year old male who is a smoker and has no PCP.  The patient states that he has had a cough and shortness of breath for 2-3 months and noticed that he was coughing up blood 3 to 4 days ago.  He is not able to lay flat due to shortness of breath and also states that he is short of breath while sitting up. CTA chest: Bulky abnormal soft tissue in the left hilum encasing the main left pulmonary artery and severely narrowing of the proximal lobar branches. 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 Bilateral adrenal nodules measuring 2 x 1.3 cm.  The right nodule is concerning for metastatic disease.  The left nodule may be an adenoma.  Small pericardial and small pleural effusions.  Consolidation in the lingula may be postobstructive.   He admits to smoking 1 pack/day but states that he stopped smoking about a month ago.  He has been taking aspirin for chest pain up to 2 or 3 a day and last took an aspirin yesterday.  He is admitted for further work-up  8/22: Pulmonary not planning any bronchoscopy while here and recommends to continue antibiotic treatment.  Oncology was also consulted for concern of metastatic disease, MRI brain and liver was ordered-still pending.  They are recommending biopsy if needed.  Patient refused MRI yesterday even with Ativan. Agreed to proceed today.  Subjective: Patient was comfortably sitting and watching TV when I entered the room.  Suddenly started hyperventilating and stating that his throat is clogging and he cannot breathe.  Looks more anxious.  Had a long discussion that without any definitive diagnosis we cannot help him.  He eventually agrees to proceed with MRI.  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  Continue IV steroids 40 mg twice daily for now - Pulmonary recommends to continue antibiotics for treatment of pneumonia and outpatient CT in 4 to 6 weeks -Continue IV Rocephin and Zithromax as recommended by pulmonology as pneumonia less likely, negative procalcitonin-we will complete a total of 5-day course. -CT abdomen pelvis shows bilateral adrenal masses, right ilium and T9 mets and prominent lymph nodes in the celiac axis worrisome for metastatic disease -Oncology was consulted. MRI of the brain and liver if patient is able to lie flat.  -Will need PET scan as an outpatient.  They are also recommending doing a biopsy if those lesions confirmed on MRI.  Active Problems:    Acute respiratory failure with hypoxia  -His pulse ox was 88% on room air at rest - Continue oxygen to keep pulse ox in the 90s.  He is requiring 4 L oxygen for now -Wean as tolerated -Anxiety might be playing some role.  Hypokalemia.  Resolved. Replete and recheck    Nicotine dependence -He admits that he was smoking up to 1 pack/day for many years and states he stopped about 1 month ago  DVT prophylaxis: SCDs Start: 05/15/21 0308  Code Status: full  Family Communication: No family listed Level of Care: Level of care: Med-Surg Disposition Plan: Home once clinically improves Status is: Inpatient  Remains inpatient appropriate because:Inpatient level of care appropriate due to severity of illness  Dispo: The patient is from: Home              Anticipated d/c is to: Home              Patient currently is not medically stable to d/c.  Will need improvement in his respiratory status.  Acutely requiring 3.5 L oxygen for now   Difficult to place patient Yes    Consultants:  Pulmonary Oncology Procedures:  none Antimicrobials:  Anti-infectives (From admission, onward)    Start      Dose/Rate Route Frequency Ordered Stop   05/17/21 1000  azithromycin (ZITHROMAX) tablet 250 mg       See Hyperspace for full Linked Orders Report.   250 mg Oral Daily 05/16/21 1339 05/21/21 0959   05/16/21 1500  cefTRIAXone (ROCEPHIN) 1 g in sodium chloride 0.9 % 100 mL IVPB        1 g 200 mL/hr over 30 Minutes Intravenous Every 24 hours 05/16/21 1339     05/16/21 1430  azithromycin (ZITHROMAX) tablet 500 mg       See Hyperspace for full Linked Orders Report.   500 mg Oral Daily 05/16/21 1339 05/16/21 1542   05/15/21 1300  cefTRIAXone (ROCEPHIN) 1 g in sodium chloride 0.9 % 100 mL IVPB        1 g 200 mL/hr over 30 Minutes Intravenous  Once 05/15/21 1256 05/15/21 1457   05/15/21 0315  cefTRIAXone (ROCEPHIN) 2 g in sodium chloride 0.9 % 100 mL IVPB  Status:  Discontinued        2 g 200 mL/hr over 30 Minutes Intravenous Every 24 hours 05/15/21 0309 05/15/21 0314   05/15/21 0315  azithromycin (ZITHROMAX) 500 mg in sodium chloride 0.9 % 250 mL IVPB  Status:  Discontinued        500 mg 250 mL/hr over 60 Minutes Intravenous Every 24 hours 05/15/21 0309 05/15/21 0314   05/14/21 2330  cefTRIAXone (ROCEPHIN) 1 g in sodium chloride 0.9 % 100 mL IVPB        1 g 200 mL/hr over 30 Minutes Intravenous  Once 05/14/21 2329 05/15/21 0033   05/14/21 2330  azithromycin (ZITHROMAX) 500 mg in sodium chloride 0.9 % 250 mL IVPB        500 mg 250 mL/hr over 60 Minutes Intravenous  Once 05/14/21 2329 05/15/21 0153        Objective: Vitals:   05/18/21 0555 05/18/21 0748 05/18/21 0757 05/18/21 1600  BP: 132/87  126/90 (!) 141/82  Pulse: 87  99 98  Resp: (!) 23  18 17   Temp: 98 F (36.7 C)   98.7 F (37.1 C)  TempSrc:      SpO2: 93% 96% 97% 98%  Weight:      Height:        Intake/Output Summary (Last 24 hours) at 05/18/2021 1640 Last data filed at 05/18/2021 1021 Gross per 24 hour  Intake 120 ml  Output 300 ml  Net -180 ml    Filed Weights   05/14/21 2243 05/16/21 1639  Weight: 102.1 kg 106.1  kg    Examination: General.  Anxious gentleman, in no acute distress. Pulmonary.  Mildly decreased breath sound at left base, normal respiratory effort. CV.  Regular rate and rhythm, no JVD, rub or murmur. Abdomen.  Soft, nontender, nondistended, BS positive. CNS.  Alert and oriented x3.  No focal neurologic deficit. Extremities.  No edema, no cyanosis, pulses intact and symmetrical. Psychiatry.  Judgment and insight appears normal.  Data Reviewed: I have personally reviewed following labs and imaging studies  CBC: Recent Labs  Lab 05/14/21 2306 05/17/21 0308  WBC 10.5 15.0*  NEUTROABS 8.4*  --   HGB 14.4 14.8  HCT 42.6 44.8  MCV 85.4 85.7  PLT 185 631    Basic Metabolic Panel: Recent Labs  Lab 05/14/21 2306 05/17/21 0308  NA 138 137  K 3.4* 4.0  CL 99 98  CO2 29 27  GLUCOSE 108* 153*  BUN 12 18  CREATININE 0.58* 0.60*  CALCIUM 9.0 9.3    GFR: Estimated Creatinine Clearance: 112 mL/min (A) (by C-G formula based on SCr of 0.6 mg/dL (L)). Liver Function Tests: Recent Labs  Lab 05/14/21 2306  AST 30  ALT 33  ALKPHOS 100  BILITOT 1.1  PROT 6.7  ALBUMIN 3.7    No results for input(s): LIPASE, AMYLASE in the last 168 hours. No results for input(s): AMMONIA in the last 168 hours. Coagulation Profile: No results for input(s): INR, PROTIME in the last 168 hours. Cardiac Enzymes: No results for input(s): CKTOTAL, CKMB, CKMBINDEX, TROPONINI in the last 168 hours. BNP (last 3 results) No results for input(s): PROBNP in the last 8760 hours. HbA1C: No results for input(s): HGBA1C in the last 72 hours. CBG: No results for input(s): GLUCAP in the last 168 hours. Lipid Profile: No results for input(s): CHOL, HDL, LDLCALC, TRIG, CHOLHDL, LDLDIRECT in the last 72 hours. Thyroid Function Tests: No results for input(s): TSH, T4TOTAL, FREET4, T3FREE, THYROIDAB in the last 72 hours. Anemia Panel: No results for input(s): VITAMINB12, FOLATE, FERRITIN, TIBC, IRON,  RETICCTPCT in the last 72 hours. Urine analysis: No results found for: COLORURINE, APPEARANCEUR, LABSPEC, PHURINE, GLUCOSEU, HGBUR, BILIRUBINUR, KETONESUR, PROTEINUR, UROBILINOGEN, NITRITE, LEUKOCYTESUR Sepsis Labs: @LABRCNTIP (procalcitonin:4,lacticidven:4) ) Recent Results (from the past 240 hour(s))  Resp Panel by RT-PCR (Flu A&B, Covid) Nasopharyngeal Swab     Status: None   Collection Time: 05/14/21 11:06 PM   Specimen: Nasopharyngeal Swab; Nasopharyngeal(NP) swabs in vial transport medium  Result Value Ref Range Status   SARS Coronavirus 2 by RT PCR NEGATIVE NEGATIVE Final    Comment: (NOTE) SARS-CoV-2 target nucleic acids are NOT DETECTED.  The SARS-CoV-2 RNA is generally detectable in upper respiratory specimens during the acute phase of infection. The lowest concentration of SARS-CoV-2 viral copies this assay can detect is 138 copies/mL. A negative result does not preclude SARS-Cov-2 infection and should not be used as the sole basis for treatment or other patient management decisions. A negative result may occur with  improper specimen collection/handling, submission of specimen other than nasopharyngeal swab, presence of viral mutation(s) within the areas targeted by this assay, and inadequate number of viral copies(<138 copies/mL). A negative result must be combined with clinical observations, patient history, and epidemiological information. The expected result is Negative.  Fact Sheet for Patients:  EntrepreneurPulse.com.au  Fact Sheet for Healthcare Providers:  IncredibleEmployment.be  This test is no t yet approved or cleared by the [place] FDA and  has been authorized for detection and/or diagnosis of SARS-CoV-2 by FDA under an Emergency Use Authorization (EUA). This EUA will remain  in effect (meaning this test can be used) for the duration of the COVID-19 declaration under Section 564(b)(1) of the Act, 21 U.S.C.section  360bbb-3(b)(1), unless the authorization is terminated  or revoked sooner.       Influenza A by PCR NEGATIVE NEGATIVE Final   Influenza B by PCR NEGATIVE NEGATIVE Final    Comment: (NOTE) The Xpert Xpress SARS-CoV-2/FLU/RSV  plus assay is intended as an aid in the diagnosis of influenza from Nasopharyngeal swab specimens and should not be used as a sole basis for treatment. Nasal washings and aspirates are unacceptable for Xpert Xpress SARS-CoV-2/FLU/RSV testing.  Fact Sheet for Patients: EntrepreneurPulse.com.au  Fact Sheet for Healthcare Providers: IncredibleEmployment.be  This test is not yet approved or cleared by the [place] FDA and has been authorized for detection and/or diagnosis of SARS-CoV-2 by FDA under an Emergency Use Authorization (EUA). This EUA will remain in effect (meaning this test can be used) for the duration of the COVID-19 declaration under Section 564(b)(1) of the Act, 21 U.S.C. section 360bbb-3(b)(1), unless the authorization is terminated or revoked.  Performed at [facility], [address] [place], [place] [address]   Culture, blood (routine x 2)     Status: None (Preliminary result)   Collection Time: 05/15/21 12:07 AM   Specimen: BLOOD  Result Value Ref Range Status   Specimen Description BLOOD RIGHT FOREARM  Final   Special Requests   Final    BOTTLES DRAWN AEROBIC AND ANAEROBIC Blood Culture results may not be optimal due to an excessive volume of blood received in culture bottles   Culture   Final    NO GROWTH 3 DAYS Performed at [facility], [address] [place], [place] [address]    Report Status PENDING  Incomplete  Culture, blood (routine x 2)     Status: None (Preliminary result)   Collection Time: 05/15/21 12:07 AM   Specimen: BLOOD  Result Value Ref Range Status   Specimen Description BLOOD LEFT FOREARM  Final   Special Requests   Final    BOTTLES DRAWN AEROBIC  AND ANAEROBIC Blood Culture results may not be optimal due to an excessive volume of blood received in culture bottles   Culture   Final    NO GROWTH 3 DAYS Performed at [facility], [address] [place], [place] [address]    Report Status PENDING  Incomplete  MRSA Next Gen by PCR, Nasal     Status: None   Collection Time: 05/15/21 11:36 PM   Specimen: Nasal Mucosa; Nasal Swab  Result Value Ref Range Status   MRSA by PCR Next Gen NOT DETECTED NOT DETECTED Final    Comment: (NOTE) The GeneXpert MRSA Assay (FDA approved for NASAL specimens only), is one component of a comprehensive MRSA colonization surveillance program. It is not intended to diagnose MRSA infection nor to guide or monitor treatment for MRSA infections. Test performance is not FDA approved in patients less than 33 years old. Performed at [facility], [address] [place], [place] [address]       Radiology Studies: MR BRAIN W WO CONTRAST  Result Date: 05/18/2021 CLINICAL DATA:  Lung cancer diagnosis.  Staging. EXAM: MRI HEAD WITHOUT AND WITH CONTRAST TECHNIQUE: Multiplanar, multiecho pulse sequences of the brain and surrounding structures were obtained without and with intravenous contrast. CONTRAST:  59mL GADAVIST GADOBUTROL 1 MMOL/ML IV SOLN COMPARISON:  None. FINDINGS: Brain: Study suffers from motion degradation. Diffusion imaging does not show any acute or subacute infarction or other cause of restricted diffusion. No evidence of chronic small vessel disease or old cortical infarction. Very few punctate foci of T2 and FLAIR signal within the white matter. No sign of mass lesion, hemorrhage, hydrocephalus or extra-axial collection. No abnormal contrast enhancement occurs. Vascular: Major vessels at the base of the brain show flow. Skull and upper cervical spine: Negative Sinuses/Orbits:  Clear/normal Other: None IMPRESSION: No evidence of regional metastatic disease. Sensitivity is slightly  limited by motion degradation and utilization of fast scanning protocol. I do not see any suspicious findings. Electronically Signed   By: Nelson Chimes M.D.   On: 05/18/2021 13:44      Scheduled Meds:  azithromycin  250 mg Oral Daily   budesonide (PULMICORT) nebulizer solution  0.5 mg Nebulization BID   feeding supplement  237 mL Oral BID BM   guaiFENesin  600 mg Oral BID   ipratropium-albuterol  3 mL Nebulization Q6H   methylPREDNISolone (SOLU-MEDROL) injection  40 mg Intravenous Q12H   multivitamin with minerals  1 tablet Oral Daily   Continuous Infusions:  cefTRIAXone (ROCEPHIN)  IV 1 g (05/18/21 1445)     LOS: 3 days   Time spent.  35 minutes.  More than 50% of that time was spent in direct patient care and coordination of care.  Lorella Nimrod, MD [facility] Pager: [email or web address] 05/18/2021, 4:40 PM

## 2021-05-18 NOTE — Progress Notes (Signed)
Physical Therapy Treatment Patient Details Name: Derek Clarke MRN: [id number] DOB: 01-30-1956 Today's Date: 05/18/2021    History of Present Illness Pt is a 65 y.o. male presenting to hospital 8/20 with SOB and cough x2 months (worsening past several days and acutely worse tonight); also reporting hemoptysis.  Pt admitted with acute respiratory failure with hypoxia, hemoptysis, mass of L lung on CT (suspect malignancy); concern for metastatic disease.    PT Comments    Pt resting in recliner upon PT arrival; agreeable to PT session.  Able to ambulate 40 feet x1 and then 80 feet x1 with sitting rest break between d/t SOB.  Pt's O2 sats 94% s/p 40 feet of ambulation and 91% after 80 feet of ambulation (all on 4 L O2 via nasal cannula).  End of session O2 sats 94% on 4 L O2 via nasal cannula at rest.  Will continue to focus on strengthening, activity tolerance, and progressive functional mobility per pt tolerance.    Follow Up Recommendations  No PT follow up     Equipment Recommendations  None recommended by PT    Recommendations for Other Services OT consult     Precautions / Restrictions Precautions Precautions: Fall Precaution Comments: Metastatic disease Restrictions Weight Bearing Restrictions: No    Mobility  Bed Mobility               General bed mobility comments: deferred (pt sitting in recliner beginning/end of session)    Transfers Overall transfer level: Independent Equipment used: None Transfers: Sit to/from Stand Sit to Stand: Independent         General transfer comment: steady safe transfers  Ambulation/Gait Ambulation/Gait assistance: Supervision Gait Distance (Feet):  (40 feet; 80 feet) Assistive device: None Gait Pattern/deviations: Step-through pattern Gait velocity: mildly decreased   General Gait Details: steady ambulation   Stairs             Wheelchair Mobility    Modified Rankin (Stroke Patients Only)        Balance Overall balance assessment: Independent                                          Cognition Arousal/Alertness: Awake/alert Behavior During Therapy: WFL for tasks assessed/performed Overall Cognitive Status: Within Functional Limits for tasks assessed                                 General Comments: Mild increased time to respond at times      Exercises      General Comments  Nursing cleared pt for participation in physical therapy.  Pt agreeable to PT session.      Pertinent Vitals/Pain Pain Assessment: No/denies pain Pain Score: 4  Pain Location: Headache Pain Descriptors / Indicators: Headache Pain Intervention(s): Limited activity within patient's tolerance;Monitored during session;Repositioned (pt reports receiving Tylenol for HA) HR WFL during sessions activities.    Home Living                      Prior Function            PT Goals (current goals can now be found in the care plan section) Acute Rehab PT Goals Patient Stated Goal: to improve breathing PT Goal Formulation: With patient Time For Goal Achievement: 05/30/21 Potential to Achieve Goals: Fair  Progress towards PT goals: Progressing toward goals    Frequency    Min 2X/week      PT Plan Current plan remains appropriate    Co-evaluation              AM-PAC PT "6 Clicks" Mobility   Outcome Measure  Help needed turning from your back to your side while in a flat bed without using bedrails?: None Help needed moving from lying on your back to sitting on the side of a flat bed without using bedrails?: None Help needed moving to and from a bed to a chair (including a wheelchair)?: None Help needed standing up from a chair using your arms (e.g., wheelchair or bedside chair)?: None Help needed to walk in hospital room?: A Little Help needed climbing 3-5 steps with a railing? : A Little 6 Click Score: 22    End of Session Equipment Utilized  During Treatment: Gait belt;Oxygen Activity Tolerance: Other (comment) (limited ambulation distance d/t SOB) Patient left: in chair;with call bell/phone within reach Nurse Communication: Mobility status;Precautions;Other (comment) (pt's O2 vitals) PT Visit Diagnosis: Other abnormalities of gait and mobility (R26.89)     Time: 1308-6578 PT Time Calculation (min) (ACUTE ONLY): 24 min  Charges:  $Therapeutic Exercise: 8-22 mins $Therapeutic Activity: 8-22 mins                    Leitha Bleak, PT 05/18/21, 3:52 PM

## 2021-05-19 ENCOUNTER — Inpatient Hospital Stay: Payer: Non-veteran care

## 2021-05-19 ENCOUNTER — Encounter: Payer: Self-pay | Admitting: Internal Medicine

## 2021-05-19 LAB — PROTIME-INR
INR: 1 (ref 0.8–1.2)
Prothrombin Time: 13.1 seconds (ref 11.4–15.2)

## 2021-05-19 LAB — CBC
HCT: 46.9 % (ref 39.0–52.0)
Hemoglobin: 15.3 g/dL (ref 13.0–17.0)
MCH: 28.8 pg (ref 26.0–34.0)
MCHC: 32.6 g/dL (ref 30.0–36.0)
MCV: 88.3 fL (ref 80.0–100.0)
Platelets: 207 10*3/uL (ref 150–400)
RBC: 5.31 MIL/uL (ref 4.22–5.81)
RDW: 12.9 % (ref 11.5–15.5)
WBC: 17.5 10*3/uL — ABNORMAL HIGH (ref 4.0–10.5)
nRBC: 0 % (ref 0.0–0.2)

## 2021-05-19 MED ORDER — POLYETHYLENE GLYCOL 3350 17 G PO PACK
17.0000 g | PACK | Freq: Every day | ORAL | Status: DC
  Filled 2021-05-19 (×3): qty 1

## 2021-05-19 MED ORDER — PREDNISONE 20 MG PO TABS
20.0000 mg | ORAL_TABLET | Freq: Every day | ORAL | Status: DC
  Administered 2021-05-20 – 2021-05-22 (×3): 20 mg via ORAL
  Filled 2021-05-19 (×3): qty 1

## 2021-05-19 NOTE — Progress Notes (Signed)
[place]  [phone]) [phone] [phone]) [phone]  ID: Derek Clarke OB: 02/22/1956  MR#: [id number]  HXT#:[id number]  Patient Care Team: Pcp, No as PCP - General  CHIEF COMPLAINT: Large lung mass and multiple liver lesions concerning for malignancy.  INTERVAL HISTORY: Patient with persistent shortness of breath and cough only mildly improved since admission.  He also admits to occasional difficulty swallowing.  He denies any fevers.  Patient offers no further specific complaints today.  REVIEW OF SYSTEMS:   Review of Systems  Constitutional:  Positive for malaise/fatigue. Negative for fever and weight loss.  Respiratory:  Positive for cough, sputum production and shortness of breath. Negative for hemoptysis.   Cardiovascular: Negative.  Negative for chest pain and leg swelling.  Gastrointestinal: Negative.  Negative for abdominal pain.  Genitourinary: Negative.  Negative for dysuria.  Musculoskeletal: Negative.  Negative for back pain.  Skin: Negative.  Negative for rash.  Neurological:  Positive for weakness. Negative for dizziness, focal weakness and headaches.  Psychiatric/Behavioral:  The patient is nervous/anxious.    As per HPI. Otherwise, a complete review of systems is negative.  PAST MEDICAL HISTORY: History reviewed. No pertinent past medical history.  PAST SURGICAL HISTORY: History reviewed. No pertinent surgical history.  FAMILY HISTORY: History reviewed. No pertinent family history.  ADVANCED DIRECTIVES (Y/N):  @ADVDIR @  HEALTH MAINTENANCE: Social History   Tobacco Use   Smoking status: Every Day    Types: Cigarettes   Smokeless tobacco: Never  Substance Use Topics   Alcohol use: Never     Colonoscopy:  PAP:  Bone density:  Lipid panel:  No Known Allergies  Current Facility-Administered Medications  Medication Dose Route Frequency Provider Last Rate Last Admin   albuterol (PROVENTIL) (2.5 MG/3ML) 0.083% nebulizer  solution 2.5 mg  2.5 mg Nebulization Q2H PRN Judd Gaudier V, MD   2.5 mg at 05/15/21 1008   budesonide (PULMICORT) nebulizer solution 0.5 mg  0.5 mg Nebulization BID Flora Lipps, MD   0.5 mg at 05/19/21 0746   feeding supplement (ENSURE ENLIVE / ENSURE PLUS) liquid 237 mL  237 mL Oral BID BM Manuella Ghazi, Vipul, MD   237 mL at 05/19/21 1606   guaiFENesin (MUCINEX) 12 hr tablet 600 mg  600 mg Oral BID Debbe Odea, MD   600 mg at 05/19/21 1040   ibuprofen (ADVIL) tablet 400 mg  400 mg Oral Q6H PRN Lorella Nimrod, MD   400 mg at 05/17/21 1100   ipratropium-albuterol (DUONEB) 0.5-2.5 (3) MG/3ML nebulizer solution 3 mL  3 mL Nebulization Q6H Amin, Soundra Pilon, MD   3 mL at 05/19/21 1605   multivitamin with minerals tablet 1 tablet  1 tablet Oral Daily Max Sane, MD   1 tablet at 05/19/21 1040   phenol (CHLORASEPTIC) mouth spray 1 spray  1 spray Mouth/Throat PRN Lorella Nimrod, MD   1 spray at 05/18/21 1411   [START ON 05/20/2021] predniSONE (DELTASONE) tablet 20 mg  20 mg Oral Q breakfast Lorella Nimrod, MD        OBJECTIVE: Vitals:   05/19/21 1500 05/19/21 1540  BP: (!) 138/95 133/87  Pulse: 88 (!) 102  Resp: (!) 22 20  Temp:  99 F (37.2 C)  SpO2: 100% 96%     Body mass index is 34.56 kg/m.    ECOG FS:2 - Symptomatic, <50% confined to bed  General: Well-developed, well-nourished, no acute distress. Eyes: Pink conjunctiva, anicteric sclera. HEENT: Normocephalic, moist mucous membranes. Lungs: Increased cough, no audible wheezing.   Heart:  Regular rate and rhythm. Abdomen: Soft, nontender, no obvious distention. Musculoskeletal: No edema, cyanosis, or clubbing. Neuro: Alert, answering all questions appropriately. Cranial nerves grossly intact. Skin: No rashes or petechiae noted. Psych: Normal affect.  LAB RESULTS:  Lab Results  Component Value Date   NA 137 05/17/2021   K 4.0 05/17/2021   CL 98 05/17/2021   CO2 27 05/17/2021   GLUCOSE 153 (H) 05/17/2021   BUN 18 05/17/2021   CREATININE  0.60 (L) 05/17/2021   CALCIUM 9.3 05/17/2021   PROT 6.7 05/14/2021   ALBUMIN 3.7 05/14/2021   AST 30 05/14/2021   ALT 33 05/14/2021   ALKPHOS 100 05/14/2021   BILITOT 1.1 05/14/2021   GFRNONAA >60 05/17/2021    Lab Results  Component Value Date   WBC 17.5 (H) 05/19/2021   NEUTROABS 8.4 (H) 05/14/2021   HGB 15.3 05/19/2021   HCT 46.9 05/19/2021   MCV 88.3 05/19/2021   PLT 207 05/19/2021     STUDIES: DG Chest 1 View  Result Date: 05/14/2021 CLINICAL DATA:  Shortness of breath for couple of months. EXAM: CHEST  1 VIEW COMPARISON:  None. FINDINGS: Mild cardiac enlargement. Perihilar infiltrates bilaterally may represent edema or multifocal pneumonia. No pleural effusions. No pneumothorax. Mediastinal contours appear intact. IMPRESSION: Bilateral perihilar infiltrates. Electronically Signed   By: Lucienne Capers M.D.   On: 05/14/2021 23:11   CT Angio Chest PE W/Cm &/Or Wo Cm  Result Date: 05/15/2021 CLINICAL DATA:  Hemoptysis EXAM: CT ANGIOGRAPHY CHEST WITH CONTRAST TECHNIQUE: Multidetector CT imaging of the chest was performed using the standard protocol during bolus administration of intravenous contrast. Multiplanar CT image reconstructions and MIPs were obtained to evaluate the vascular anatomy. CONTRAST:  14mL OMNIPAQUE IOHEXOL 350 MG/ML SOLN COMPARISON:  None. FINDINGS: Cardiovascular: Contrast injection is sufficient to demonstrate satisfactory opacification of the pulmonary arteries to the segmental level. There is no pulmonary embolus or evidence of right heart strain. The size of the main pulmonary artery is normal. Proximal left pulmonary arteries are severely narrowed by confluent soft tissue surrounding the left hilum. The course and caliber of the aorta are normal. There is atherosclerotic calcification. Opacification decreased due to pulmonary arterial phase contrast bolus timing. There is a small pericardial effusion. Mediastinum/Nodes: There is bulky abnormal mediastinal  soft tissue with in case mint of the left common carotid artery and proximal left subclavian artery. There are multiple enlarged mediastinal lymph nodes. Level 4 are node measures 10 mm. Lungs/Pleura: There is confluent soft tissue at the left hilum encasing the main left pulmonary artery and severely narrowing the proximal lobar branches. Abnormal soft tissue extends in a peribronchial pattern. There is also abnormal soft tissue at the right hilum. There is atelectasis within the right middle lobe and an area of consolidation in the lingula. Small pleural effusions. Upper Abdomen: Contrast bolus timing is not optimized for evaluation of the abdominal organs. There is an intermediate density 2.0 x 1.3 cm right adrenal nodule. There is a lower density left adrenal nodule measuring 1.5 cm. Musculoskeletal: No chest wall abnormality. No bony spinal canal stenosis. Review of the MIP images confirms the above findings. IMPRESSION: 1. No pulmonary embolus or acute aortic syndrome. 2. Bulky abnormal soft tissue at the left hilum encasing the main left pulmonary artery and severely narrowing the proximal lobar branches, consistent with malignancy. 3. Multiple enlarged mediastinal lymph nodes, consistent with lymphatic metastatic disease. 4. Bilateral adrenal nodules, measuring up to 2.0 x 1.3 cm. The right adrenal nodule is concerning  for metastatic disease. The left may be an adenoma. 5. Small pericardial effusion and small pleural effusions. 6. Consolidation in the lingula may be postobstructive. Aortic atherosclerosis (ICD10-I70.0). Electronically Signed   By: Ulyses Jarred M.D.   On: 05/15/2021 01:57   MR BRAIN W WO CONTRAST  Result Date: 05/18/2021 CLINICAL DATA:  Lung cancer diagnosis.  Staging. EXAM: MRI HEAD WITHOUT AND WITH CONTRAST TECHNIQUE: Multiplanar, multiecho pulse sequences of the brain and surrounding structures were obtained without and with intravenous contrast. CONTRAST:  53mL GADAVIST GADOBUTROL 1  MMOL/ML IV SOLN COMPARISON:  None. FINDINGS: Brain: Study suffers from motion degradation. Diffusion imaging does not show any acute or subacute infarction or other cause of restricted diffusion. No evidence of chronic small vessel disease or old cortical infarction. Very few punctate foci of T2 and FLAIR signal within the white matter. No sign of mass lesion, hemorrhage, hydrocephalus or extra-axial collection. No abnormal contrast enhancement occurs. Vascular: Major vessels at the base of the brain show flow. Skull and upper cervical spine: Negative Sinuses/Orbits: Clear/normal Other: None IMPRESSION: No evidence of regional metastatic disease. Sensitivity is slightly limited by motion degradation and utilization of fast scanning protocol. I do not see any suspicious findings. Electronically Signed   By: Nelson Chimes M.D.   On: 05/18/2021 13:44   CT ABDOMEN PELVIS W CONTRAST  Result Date: 05/15/2021 CLINICAL DATA:  Abnormal left hilar soft tissue noted on chest CTA. This was concerning for malignancy. Additional evaluation to assess for possible metastatic disease. EXAM: CT ABDOMEN AND PELVIS WITH CONTRAST TECHNIQUE: Multidetector CT imaging of the abdomen and pelvis was performed using the standard protocol following bolus administration of intravenous contrast. CONTRAST:  24mL OMNIPAQUE IOHEXOL 350 MG/ML SOLN COMPARISON:  Current chest CTA. FINDINGS: Lower chest: Small right effusion. Left lung base subcentimeter nodules. Small patchy areas of ground-glass opacity at the left lung base. Pericardial effusion. No change from the earlier chest CTA. Hepatobiliary: Subtle liver lesions suspicious for metastatic disease. Largest lies in the right lobe, segment 6, 1.4 cm in size. Liver normal in size and overall attenuation. Mildly distended gallbladder. Increased attenuation material in the dependent gallbladder possibly small stones or sludge. No wall thickening or inflammation. No bile duct dilation. Pancreas:  Unremarkable. No pancreatic ductal dilatation or surrounding inflammatory changes. Spleen: Normal in size without focal abnormality. Adrenals/Urinary Tract: Bilateral adrenal masses, right 2.1 x 1.4 cm, left 1.1 cm size. Symmetric renal enhancement and excretion. No renal masses, stones or hydronephrosis. Normal ureters. Bladder minimally distended. Wall appears thickened. No bladder mass. Stomach/Bowel: Normal stomach. Small bowel and colon are normal in caliber. No wall thickening. No inflammation. Normal appendix suggested. No evidence of appendicitis. Vascular/Lymphatic: Mild aortic atherosclerosis.  No aneurysm. Prominent lymph nodes above the celiac axis, largest 1.2 cm in short axis. Prominent but subcentimeter retroperitoneal lymph nodes. No other enlarged nodes. Reproductive: Significant prostate enlargement elevating the bladder base. Prostate measures 6.9 cm superior to inferior by 5.8 x 5.6 cm transversely. Other: No abdominal wall hernia or abnormality. No abdominopelvic ascites. Musculoskeletal: Small area of sclerosis in the right ilium, likely a bone island. More definitive bone island noted in the anterior left femoral head. Vague sclerotic lesion along the lower endplate of T9. No osteolytic lesions. No fracture or acute finding. IMPRESSION: 1. Subtle low-density liver lesions consistent with metastatic disease. 2. Bilateral adrenal masses suspicious for metastatic disease. 3. Prominent lymph nodes above the celiac axis suspicious for metastatic disease. 4. Subtle areas of sclerosis, right ilium and lower  endplate of T9, nonspecific, possibly metastatic disease. 5. No acute findings. Electronically Signed   By: Lajean Manes M.D.   On: 05/15/2021 17:56   MR LIVER W WO CONTRAST  Result Date: 05/18/2021 CLINICAL DATA:  Inpatient. Acute respiratory failure with infiltrative left upper lobe soft tissue and bilateral mediastinal adenopathy suspicious on chest CT for lung malignancy. Indeterminate  liver and bilateral adrenal lesions on CT abdomen study. EXAM: MRI ABDOMEN WITHOUT AND WITH CONTRAST TECHNIQUE: Multiplanar multisequence MR imaging of the abdomen was performed both before and after the administration of intravenous contrast. CONTRAST:  95mL GADAVIST GADOBUTROL 1 MMOL/ML IV SOLN COMPARISON:  05/15/2021 CT abdomen/pelvis and chest CT angiogram. FINDINGS: Substantially motion degraded exam, limiting assessment. Lower chest: Small right and trace left dependent pleural effusions. Geographic consolidation in the visualized lingula and right middle lobe. Hepatobiliary: Normal liver size and configuration. No hepatic steatosis. There are several (at least 5) mildly T2 hyperintense T1 hypointense hypoenhancing liver masses scattered throughout the liver with restricted diffusion, compatible with metastatic disease, largest 1.5 x 1.3 cm in the segment 6 right liver (series 4/image 20), 1.3 x 1.1 cm in the anterior segment 4 left liver (series 4/image 18) and 1.1 x 1.1 cm in the segment 8 right liver (series 4/image 9). Normal gallbladder with no cholelithiasis. No biliary ductal dilatation. Common bile duct diameter 4 mm. No choledocholithiasis. No biliary masses, strictures or beading. Pancreas: No pancreatic mass or duct dilation.  No pancreas divisum. Spleen: Normal size. No mass. Adrenals/Urinary Tract: Enhancing right adrenal 2.5 x 1.7 cm nodule (series 3/image 18) demonstrates no significant loss of signal intensity on chemical shift imaging and is indeterminate. Similar enhancing left adrenal 1.5 x 1.3 cm nodule (series 3/image 23), too small to characterize for intralesional lipid on the motion degraded chemical shift images. No hydronephrosis. Scattered bilateral simple renal cysts, largest 1.6 cm in the anterior upper left kidney. 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 No overtly suspicious renal masses. Stomach/Bowel: Normal non-distended stomach. Visualized small and large bowel is normal caliber, with no bowel wall thickening. Vascular/Lymphatic: Normal caliber abdominal aorta. Patent portal, splenic, hepatic and renal veins. A few mildly enlarged left para-aortic nodes, largest 1.2 cm short axis diameter (series 18/image 66). Enlarged 1.5 cm high left retroperitoneal node anterior to the left diaphragmatic crus (series 18/image 37). Other: No abdominal ascites or focal fluid collection. Musculoskeletal: Enhancing inferior T9 vertebral 2.8 cm and right T10 vertebral 1.1 cm bone lesions (series 17/image 19). Enhancing 2.1 cm medial right iliac bone lesion (series 17/image 19). IMPRESSION: 1. Limited motion degraded scan. 2. Several (at least 5) hypoenhancing liver masses scattered throughout the liver, with MRI features compatible with metastatic disease. Percutaneous biopsy of the 1.5 cm segment 6 right liver or 1.3 cm anterior segment 4 left liver lesions could be considered. 3. Indeterminate enhancing bilateral adrenal nodules, measuring 2.5 cm on the right and 1.5 cm on the left. Metastatic disease is not excluded. 4. Left para-aortic and high left retroperitoneal lymphadenopathy suspicious for metastatic disease. 5. Enhancing T9 and T10 vertebral and medial right iliac bone lesions, suspicious for bone metastases. 6. Small right and trace left dependent pleural effusions. Geographic consolidation in the visualized lingula and right middle lobe, nonspecific, please see recent chest CT angiogram study for details. Electronically Signed   By: Ilona Sorrel  M.D.   On: 05/18/2021 16:39    ASSESSMENT: Lung mass and multiple liver lesions concerning for malignancy.  PLAN:    1.  Lung mass and multiple liver lesions: Highly concerning for underlying malignancy.  MRI of the abdomen  revealed lesions in liver consistent with metastatic disease.  Patient underwent ultrasound-guided biopsy of supraclavicular lymph node to obtain diagnosis.  If nondiagnostic or negative, can consider biopsy of liver lesions.  MRI brain is negative.  Patient will require PET scan as an outpatient to complete staging work-up.  No further intervention is needed. 2.  Shortness of breath/cough: Continue current antibiotics and steroids. 3.  Leukocytosis: Likely reactive, monitor.  Will follow.  Lloyd Huger, MD   05/19/2021 6:17 PM

## 2021-05-19 NOTE — Progress Notes (Signed)
PROGRESS NOTE    Derek Clarke   MEQ:[id number]  DOB: [birth year]  DOA: 05/14/2021 PCP: Merryl Hacker, No   Brief Narrative:  Derek Clarke is a 65 year old male who is a smoker and has no PCP.  The patient states that he has had a cough and shortness of breath for 2-3 months and noticed that he was coughing up blood 3 to 4 days ago.  He is not able to lay flat due to shortness of breath and also states that he is short of breath while sitting up. CTA chest: Bulky abnormal soft tissue in the left hilum encasing the main left pulmonary artery and severely narrowing of the proximal lobar branches. 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 Bilateral adrenal nodules measuring 2 x 1.3 cm.  The right nodule is concerning for metastatic disease.  The left nodule may be an adenoma.  Small pericardial and small pleural effusions.  Consolidation in the lingula may be postobstructive.   He admits to smoking 1 pack/day but states that he stopped smoking about a month ago.  He has been taking aspirin for chest pain up to 2 or 3 a day and last took an aspirin yesterday.  He is admitted for further work-up  8/22: Pulmonary not planning any bronchoscopy while here and recommends to continue antibiotic treatment.  Oncology was also consulted for concern of metastatic disease, patient had his MRI brain and liver yesterday.  8/25: MRI brain without any acute abnormality.  MRI liver with multiple liver and adrenal lesions, concern of metastatic disease, multiple lymph node involvement.  Discussed with IR and oncology, initial plan was to obtain a liver biopsy, later found to have an abnormal left supraclavicular lymph node with Easy Access which was biopsied by IR today.  Subjective: Patient was seen and examined today.  He appears anxious and having a lot of questions regarding biopsy.  Answered to his satisfaction.  Continue to have shortness of breath which increased  with lying flat.  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   - Pulmonary recommends to continue antibiotics for treatment of pneumonia and outpatient CT in 4 to 6 weeks, per Dr.Kasa's note he does not think that it is malignancy and they want to treated as infection first.  Procalcitonin negative, imaging is very concerning for metastatic disease. -We will complete his antibiotics today. -Decrease the dose of steroid so we can stop in next couple of days. -CT abdomen pelvis shows bilateral adrenal masses, right ilium and T9 mets and prominent lymph nodes in the celiac axis worrisome for metastatic disease -Oncology was consulted.  MRI brain was without any acute abnormality but MRI liver with concern of metastatic disease. -Left supraclavicular abnormal lymph node was biopsied by IR today-pending pathology -Will need PET scan as an outpatient.    Active Problems:    Acute respiratory failure with hypoxia  -His pulse ox was 88% on room air at rest - Continue oxygen to keep pulse ox in the 90s.  He is requiring 4 L oxygen for now -Wean as tolerated -Anxiety might be playing some role.  Hypokalemia.  Resolved. Replete and recheck    Nicotine dependence -He admits that he was smoking up to 1 pack/day for many years and states he stopped about 1 month ago  DVT prophylaxis: SCDs Start: 05/15/21  0308  Code Status: full  Family Communication: No family listed Level of Care: Level of care: Med-Surg Disposition Plan: Home once clinically improves Status is: Inpatient  Remains inpatient appropriate because:Inpatient level of care appropriate due to severity of illness  Dispo: The patient is from: Home              Anticipated d/c is to: Home              Patient currently is not medically stable to d/c.  Will  need improvement in his respiratory status.  Acutely requiring 3.5 L oxygen for now   Difficult to place patient Yes    Consultants:  Pulmonary Oncology Procedures:  Lymph node biopsy Antimicrobials:  Anti-infectives (From admission, onward)    Start     Dose/Rate Route Frequency Ordered Stop   05/17/21 1000  azithromycin (ZITHROMAX) tablet 250 mg  Status:  Discontinued       See Hyperspace for full Linked Orders Report.   250 mg Oral Daily 05/16/21 1339 05/19/21 1232   05/16/21 1500  cefTRIAXone (ROCEPHIN) 1 g in sodium chloride 0.9 % 100 mL IVPB        1 g 200 mL/hr over 30 Minutes Intravenous Every 24 hours 05/16/21 1339 05/20/21 1459   05/16/21 1430  azithromycin (ZITHROMAX) tablet 500 mg       See Hyperspace for full Linked Orders Report.   500 mg Oral Daily 05/16/21 1339 05/16/21 1542   05/15/21 1300  cefTRIAXone (ROCEPHIN) 1 g in sodium chloride 0.9 % 100 mL IVPB        1 g 200 mL/hr over 30 Minutes Intravenous  Once 05/15/21 1256 05/15/21 1457   05/15/21 0315  cefTRIAXone (ROCEPHIN) 2 g in sodium chloride 0.9 % 100 mL IVPB  Status:  Discontinued        2 g 200 mL/hr over 30 Minutes Intravenous Every 24 hours 05/15/21 0309 05/15/21 0314   05/15/21 0315  azithromycin (ZITHROMAX) 500 mg in sodium chloride 0.9 % 250 mL IVPB  Status:  Discontinued        500 mg 250 mL/hr over 60 Minutes Intravenous Every 24 hours 05/15/21 0309 05/15/21 0314   05/14/21 2330  cefTRIAXone (ROCEPHIN) 1 g in sodium chloride 0.9 % 100 mL IVPB        1 g 200 mL/hr over 30 Minutes Intravenous  Once 05/14/21 2329 05/15/21 0033   05/14/21 2330  azithromycin (ZITHROMAX) 500 mg in sodium chloride 0.9 % 250 mL IVPB        500 mg 250 mL/hr over 60 Minutes Intravenous  Once 05/14/21 2329 05/15/21 0153        Objective: Vitals:   05/19/21 0829 05/19/21 1157 05/19/21 1500 05/19/21 1540  BP: (!) 133/92 (!) 136/95 (!) 138/95 133/87  Pulse: 94 87 88 (!) 102  Resp: 18 20 (!) 22 20  Temp: 98.2 F (36.8  C) 98 F (36.7 C)  99 F (37.2 C)  TempSrc:      SpO2: 97% 100% 100% 96%  Weight:      Height:        Intake/Output Summary (Last 24 hours) at 05/19/2021 1618 Last data filed at 05/18/2021 2300 Gross per 24 hour  Intake 1763.34 ml  Output --  Net 1763.34 ml    Filed Weights   05/14/21 2243 05/16/21 1639  Weight: 102.1 kg 106.1 kg    Examination: General.  Well-developed, anxious looking gentleman, in no acute distress. Pulmonary.  Lungs clear  bilaterally, mildly decreased breath sounds on left,normal respiratory effort. CV.  Regular rate and rhythm, no JVD, rub or murmur. Abdomen.  Soft, nontender, nondistended, BS positive. CNS.  Alert and oriented x3.  No focal neurologic deficit. Extremities.  No edema, no cyanosis, pulses intact and symmetrical. Psychiatry.  Judgment and insight appears normal.   Data Reviewed: I have personally reviewed following labs and imaging studies  CBC: Recent Labs  Lab 05/14/21 2306 05/17/21 0308 05/19/21 0815  WBC 10.5 15.0* 17.5*  NEUTROABS 8.4*  --   --   HGB 14.4 14.8 15.3  HCT 42.6 44.8 46.9  MCV 85.4 85.7 88.3  PLT 185 209 161    Basic Metabolic Panel: Recent Labs  Lab 05/14/21 2306 05/17/21 0308  NA 138 137  K 3.4* 4.0  CL 99 98  CO2 29 27  GLUCOSE 108* 153*  BUN 12 18  CREATININE 0.58* 0.60*  CALCIUM 9.0 9.3    GFR: Estimated Creatinine Clearance: 112 mL/min (A) (by C-G formula based on SCr of 0.6 mg/dL (L)). Liver Function Tests: Recent Labs  Lab 05/14/21 2306  AST 30  ALT 33  ALKPHOS 100  BILITOT 1.1  PROT 6.7  ALBUMIN 3.7    No results for input(s): LIPASE, AMYLASE in the last 168 hours. No results for input(s): AMMONIA in the last 168 hours. Coagulation Profile: Recent Labs  Lab 05/19/21 0815  INR 1.0   Cardiac Enzymes: No results for input(s): CKTOTAL, CKMB, CKMBINDEX, TROPONINI in the last 168 hours. BNP (last 3 results) No results for input(s): PROBNP in the last 8760 hours. HbA1C: No  results for input(s): HGBA1C in the last 72 hours. CBG: No results for input(s): GLUCAP in the last 168 hours. Lipid Profile: No results for input(s): CHOL, HDL, LDLCALC, TRIG, CHOLHDL, LDLDIRECT in the last 72 hours. Thyroid Function Tests: No results for input(s): TSH, T4TOTAL, FREET4, T3FREE, THYROIDAB in the last 72 hours. Anemia Panel: No results for input(s): VITAMINB12, FOLATE, FERRITIN, TIBC, IRON, RETICCTPCT in the last 72 hours. Urine analysis: No results found for: COLORURINE, APPEARANCEUR, LABSPEC, PHURINE, GLUCOSEU, HGBUR, BILIRUBINUR, KETONESUR, PROTEINUR, UROBILINOGEN, NITRITE, LEUKOCYTESUR Sepsis Labs: @LABRCNTIP (procalcitonin:4,lacticidven:4) ) Recent Results (from the past 240 hour(s))  Resp Panel by RT-PCR (Flu A&B, Covid) Nasopharyngeal Swab     Status: None   Collection Time: 05/14/21 11:06 PM   Specimen: Nasopharyngeal Swab; Nasopharyngeal(NP) swabs in vial transport medium  Result Value Ref Range Status   SARS Coronavirus 2 by RT PCR NEGATIVE NEGATIVE Final    Comment: (NOTE) SARS-CoV-2 target nucleic acids are NOT DETECTED.  The SARS-CoV-2 RNA is generally detectable in upper respiratory specimens during the acute phase of infection. The lowest concentration of SARS-CoV-2 viral copies this assay can detect is 138 copies/mL. A negative result does not preclude SARS-Cov-2 infection and should not be used as the sole basis for treatment or other patient management decisions. A negative result may occur with  improper specimen collection/handling, submission of specimen other than nasopharyngeal swab, presence of viral mutation(s) within the areas targeted by this assay, and inadequate number of viral copies(<138 copies/mL). A negative result must be combined with clinical observations, patient history, and epidemiological information. The expected result is Negative.  Fact Sheet for Patients:  EntrepreneurPulse.com.au  Fact Sheet for  Healthcare Providers:  IncredibleEmployment.be  This test is no t yet approved or cleared by the [place] FDA and  has been authorized for detection and/or diagnosis of SARS-CoV-2 by FDA under an Emergency Use Authorization (EUA). This EUA  will remain  in effect (meaning this test can be used) for the duration of the COVID-19 declaration under Section 564(b)(1) of the Act, 21 U.S.C.section 360bbb-3(b)(1), unless the authorization is terminated  or revoked sooner.       Influenza A by PCR NEGATIVE NEGATIVE Final   Influenza B by PCR NEGATIVE NEGATIVE Final    Comment: (NOTE) The Xpert Xpress SARS-CoV-2/FLU/RSV plus assay is intended as an aid in the diagnosis of influenza from Nasopharyngeal swab specimens and should not be used as a sole basis for treatment. Nasal washings and aspirates are unacceptable for Xpert Xpress SARS-CoV-2/FLU/RSV testing.  Fact Sheet for Patients: EntrepreneurPulse.com.au  Fact Sheet for Healthcare Providers: IncredibleEmployment.be  This test is not yet approved or cleared by the [place] FDA and has been authorized for detection and/or diagnosis of SARS-CoV-2 by FDA under an Emergency Use Authorization (EUA). This EUA will remain in effect (meaning this test can be used) for the duration of the COVID-19 declaration under Section 564(b)(1) of the Act, 21 U.S.C. section 360bbb-3(b)(1), unless the authorization is terminated or revoked.  Performed at [facility], [address] [place], [place] [address]   Culture, blood (routine x 2)     Status: None (Preliminary result)   Collection Time: 05/15/21 12:07 AM   Specimen: BLOOD  Result Value Ref Range Status   Specimen Description BLOOD RIGHT FOREARM  Final   Special Requests   Final    BOTTLES DRAWN AEROBIC AND ANAEROBIC Blood Culture results may not be optimal due to an excessive volume of blood received in culture bottles    Culture   Final    NO GROWTH 4 DAYS Performed at [facility] [address], [place], [place] [address]    Report Status PENDING  Incomplete  Culture, blood (routine x 2)     Status: None (Preliminary result)   Collection Time: 05/15/21 12:07 AM   Specimen: BLOOD  Result Value Ref Range Status   Specimen Description BLOOD LEFT FOREARM  Final   Special Requests   Final    BOTTLES DRAWN AEROBIC AND ANAEROBIC Blood Culture results may not be optimal due to an excessive volume of blood received in culture bottles   Culture   Final    NO GROWTH 4 DAYS Performed at [facility], [address], [place], [place] [address]    Report Status PENDING  Incomplete  MRSA Next Gen by PCR, Nasal     Status: None   Collection Time: 05/15/21 11:36 PM   Specimen: Nasal Mucosa; Nasal Swab  Result Value Ref Range Status   MRSA by PCR Next Gen NOT DETECTED NOT DETECTED Final    Comment: (NOTE) The GeneXpert MRSA Assay (FDA approved for NASAL specimens only), is one component of a comprehensive MRSA colonization surveillance program. It is not intended to diagnose MRSA infection nor to guide or monitor treatment for MRSA infections. Test performance is not FDA approved in patients less than 57 years old. Performed at [facility], [address] [place], [place] [address]       Radiology Studies: MR BRAIN W WO CONTRAST  Result Date: 05/18/2021 CLINICAL DATA:  Lung cancer diagnosis.  Staging. EXAM: MRI HEAD WITHOUT AND WITH CONTRAST TECHNIQUE: Multiplanar, multiecho pulse sequences of the brain and surrounding structures were obtained without and with intravenous contrast. CONTRAST:  54mL GADAVIST GADOBUTROL 1 MMOL/ML IV SOLN COMPARISON:  None. FINDINGS: Brain: Study suffers from motion degradation. Diffusion imaging does not show any  acute or subacute infarction or other cause of restricted diffusion. No evidence of chronic small vessel disease or old cortical  infarction. Very few punctate foci of T2 and FLAIR signal within the white matter. No sign of mass lesion, hemorrhage, hydrocephalus or extra-axial collection. No abnormal contrast enhancement occurs. Vascular: Major vessels at the base of the brain show flow. Skull and upper cervical spine: Negative Sinuses/Orbits: Clear/normal Other: None IMPRESSION: No evidence of regional metastatic disease. Sensitivity is slightly limited by motion degradation and utilization of fast scanning protocol. I do not see any suspicious findings. Electronically Signed   By: Nelson Chimes M.D.   On: 05/18/2021 13:44   MR LIVER W WO CONTRAST  Result Date: 05/18/2021 CLINICAL DATA:  Inpatient. Acute respiratory failure with infiltrative left upper lobe soft tissue and bilateral mediastinal adenopathy suspicious on chest CT for lung malignancy. Indeterminate liver and bilateral adrenal lesions on CT abdomen study. EXAM: MRI ABDOMEN WITHOUT AND WITH CONTRAST TECHNIQUE: Multiplanar multisequence MR imaging of the abdomen was performed both before and after the administration of intravenous contrast. CONTRAST:  32mL GADAVIST GADOBUTROL 1 MMOL/ML IV SOLN COMPARISON:  05/15/2021 CT abdomen/pelvis and chest CT angiogram. FINDINGS: Substantially motion degraded exam, limiting assessment. Lower chest: Small right and trace left dependent pleural effusions. Geographic consolidation in the visualized lingula and right middle lobe. Hepatobiliary: Normal liver size and configuration. No hepatic steatosis. There are several (at least 5) mildly T2 hyperintense T1 hypointense hypoenhancing liver masses scattered throughout the liver with restricted diffusion, compatible with metastatic disease, largest 1.5 x 1.3 cm in the segment 6 right liver (series 4/image 20), 1.3 x 1.1 cm in the anterior segment 4 left liver (series 4/image 18) and 1.1 x 1.1 cm in the segment 8 right liver (series 4/image 9). Normal gallbladder with no cholelithiasis. No biliary  ductal dilatation. Common bile duct diameter 4 mm. No choledocholithiasis. No biliary masses, strictures or beading. Pancreas: No pancreatic mass or duct dilation.  No pancreas divisum. Spleen: Normal size. No mass. Adrenals/Urinary Tract: Enhancing right adrenal 2.5 x 1.7 cm nodule (series 3/image 18) demonstrates no significant loss of signal intensity on chemical shift imaging and is indeterminate. Similar enhancing left adrenal 1.5 x 1.3 cm nodule (series 3/image 23), too small to characterize for intralesional lipid on the motion degraded chemical shift images. No hydronephrosis. Scattered bilateral simple renal cysts, largest 1.6 cm in the anterior upper left kidney. T2 hypointense T1 hyperintense 1.1 cm renal cortical lesion in the lower right kidney (series 4/image 33) without compelling enhancement on the motion degraded postcontrast sequences, compatible with hemorrhagic/proteinaceous Bosniak category 2 renal cyst. No overtly suspicious renal masses. Stomach/Bowel: Normal non-distended stomach. Visualized small and large bowel is normal caliber, with no bowel wall thickening. Vascular/Lymphatic: Normal caliber abdominal aorta. Patent portal, splenic, hepatic and renal veins. A few mildly enlarged left para-aortic nodes, largest 1.2 cm short axis diameter (series 18/image 66). Enlarged 1.5 cm high left retroperitoneal node anterior to the left diaphragmatic crus (series 18/image 37). Other: No abdominal ascites or focal fluid collection. Musculoskeletal: Enhancing inferior T9 vertebral 2.8 cm and right T10 vertebral 1.1 cm bone lesions (series 17/image 19). Enhancing 2.1 cm medial right iliac bone lesion (series 17/image 19). IMPRESSION: 1. Limited motion degraded scan. 2. Several (at least 5) hypoenhancing liver masses scattered throughout the liver, with MRI features compatible with metastatic disease. Percutaneous biopsy of the 1.5 cm segment 6 right liver or 1.3 cm anterior segment 4 left liver lesions  could be considered. 3.  Indeterminate enhancing bilateral adrenal nodules, measuring 2.5 cm on the right and 1.5 cm on the left. Metastatic disease is not excluded. 4. Left para-aortic and high left retroperitoneal lymphadenopathy suspicious for metastatic disease. 5. Enhancing T9 and T10 vertebral and medial right iliac bone lesions, suspicious for bone metastases. 6. Small right and trace left dependent pleural effusions. Geographic consolidation in the visualized lingula and right middle lobe, nonspecific, please see recent chest CT angiogram study for details. Electronically Signed   By: Ilona Sorrel M.D.   On: 05/18/2021 16:39      Scheduled Meds:  budesonide (PULMICORT) nebulizer solution  0.5 mg Nebulization BID   feeding supplement  237 mL Oral BID BM   guaiFENesin  600 mg Oral BID   ipratropium-albuterol  3 mL Nebulization Q6H   methylPREDNISolone (SOLU-MEDROL) injection  40 mg Intravenous Q12H   multivitamin with minerals  1 tablet Oral Daily   Continuous Infusions:  cefTRIAXone (ROCEPHIN)  IV 1 g (05/19/21 1606)     LOS: 4 days   Time spent.  38 minutes.  More than 50% of that time was spent in direct patient care and coordination of care.  Lorella Nimrod, MD [facility] Pager: [email or web address] 05/19/2021, 4:18 PM

## 2021-05-19 NOTE — Plan of Care (Signed)
Patient alert and oriented x 4, denies pain with assess. No adverse events during shift. Vitals stable, remains on 4 liters humidified oxygen via nasal canula. Dyspnea on exertion with expressive aphasia and slow responses. Tolerated medications po with pudding. Stable condition at the end of shift, will continue to monitor.  Problem: Education: Goal: Knowledge of General Education information will improve Description: Including pain rating scale, medication(s)/side effects and non-pharmacologic comfort measures Outcome: Progressing   Problem: Health Behavior/Discharge Planning: Goal: Ability to manage health-related needs will improve Outcome: Progressing   Problem: Clinical Measurements: Goal: Ability to maintain clinical measurements within normal limits will improve Outcome: Progressing Goal: Will remain free from infection Outcome: Progressing Goal: Diagnostic test results will improve Outcome: Progressing Goal: Respiratory complications will improve Outcome: Progressing Goal: Cardiovascular complication will be avoided Outcome: Progressing   Problem: Activity: Goal: Risk for activity intolerance will decrease Outcome: Progressing   Problem: Nutrition: Goal: Adequate nutrition will be maintained Outcome: Progressing   Problem: Coping: Goal: Level of anxiety will decrease Outcome: Progressing   Problem: Elimination: Goal: Will not experience complications related to bowel motility Outcome: Progressing Goal: Will not experience complications related to urinary retention Outcome: Progressing   Problem: Pain Managment: Goal: General experience of comfort will improve Outcome: Progressing   Problem: Safety: Goal: Ability to remain free from injury will improve Outcome: Progressing   Problem: Skin Integrity: Goal: Risk for impaired skin integrity will decrease Outcome: Progressing

## 2021-05-19 NOTE — TOC Progression Note (Signed)
Transition of Care [facility]) - Progression Note    Patient Details  Name: Derek Clarke MRN: [id number] Date of Birth: 1956-06-10  Transition of Care [facility]) CM/SW [place], RN Phone Number: 05/19/2021, 3:51 PM  Clinical Narrative:   Spoke to patient, states he is unsure about being discharged home at this time, states he would like to speak to MD tomorrow about discharge plans.  Care team made aware.         Expected Discharge Plan and Services                                                 Social Determinants of Health (SDOH) Interventions    Readmission Risk Interventions No flowsheet data found.

## 2021-05-19 NOTE — Procedures (Signed)
Interventional Radiology Procedure Note  Procedure: US Guided Biopsy of left supraclavicular lymph node  Complications: None  Estimated Blood Loss: < 10 mL  Findings: 18 G core biopsy of 1.6 cm left [address] LN performed under US guidance.  Four core samples obtained and sent to Pathology.  Venetia Night. Kathlene Cote, M.D Pager:  [phone]

## 2021-05-19 NOTE — Progress Notes (Signed)
PT Cancellation Note  Patient Details Name: Derek Clarke MRN: [id number] DOB: [birth year]   Cancelled Treatment:    Reason Eval/Treat Not Completed: Patient out of room at procedure. Will attempt to see pt at a future date/time as medically appropriate.     Linus Salmons PT, DPT 05/19/21, 2:52 PM

## 2021-05-19 NOTE — Consult Note (Signed)
Chief Complaint: Patient was seen in consultation today for liver lesions at the request of Lorella Nimrod, MD  Referring Physician(s): Lorella Nimrod, MD  Supervising Physician: Aletta Edouard  Patient Status: [facility] - In-pt  History of Present Illness: Derek Clarke is a 65 y.o. male who presented to ED on 8/20 with complaints of shortness of breath and cough that has affected his sleeping and inability to lay flat in bed since end of June. The patients symptoms progressively worsened and he developed some hemoptysis. Imaging done here 8/21 revealed concern for malignancy in the lungs, liver lesions and lymphadenopathy. IR received request for image guided liver lesion biopsy. The patient today states his breathing is about the same as prior to the hospital, he has only been sitting in his chair upright secondary to his shortness of breath and is on 4L of oxygen.   History reviewed. No pertinent past medical history.  History reviewed. No pertinent surgical history.  Allergies: Patient has no known allergies.  Medications: Prior to Admission medications   Medication Sig Start Date End Date Taking? Authorizing Provider  aspirin 325 MG tablet Take 325 mg by mouth daily.   Yes [provider]  guaiFENesin (MUCINEX) 600 MG 12 hr tablet Take 600 mg by mouth 2 (two) times daily as needed for cough or to loosen phlegm.   Yes [provider]  ibuprofen (ADVIL) 400 MG tablet Take 400 mg by mouth every 6 (six) hours as needed.   Yes [provider]     History reviewed. No pertinent family history.  Social History   Socioeconomic History   Marital status: Single    Spouse name: Not on file   Number of children: Not on file   Years of education: Not on file   Highest education level: Not on file  Occupational History   Not on file  Tobacco Use   Smoking status: Every Day    Types: Cigarettes   Smokeless tobacco: Never  Substance and Sexual Activity    Alcohol use: Never   Drug use: Not on file   Sexual activity: Not on file  Other Topics Concern   Not on file  Social History Narrative   Not on file   Social Determinants of Health   Financial Resource Strain: Not on file  Food Insecurity: Not on file  Transportation Needs: Not on file  Physical Activity: Not on file  Stress: Not on file  Social Connections: Not on file   Review of Systems: A 12 point ROS discussed and pertinent positives are indicated in the HPI above.  All other systems are negative.  Review of Systems  Vital Signs: BP (!) 136/95 (BP Location: Left Arm)   Pulse 87   Temp 98 F (36.7 C)   Resp 20   Ht 5\' 9"  (1.753 m)   Wt 234 lb (106.1 kg)   SpO2 100%   BMI 34.56 kg/m  On 4 Liters of oxygen   Physical Exam Constitutional:      Appearance: He is ill-appearing.  HENT:     Head: Normocephalic and atraumatic.  Pulmonary:     Comments: Respiratory distress noted when patient talks or exerts himself.  Skin:    General: Skin is warm and dry.  Neurological:     Mental Status: He is alert and oriented to person, place, and time.    Imaging: DG Chest 1 View  Result Date: 05/14/2021 CLINICAL DATA:  Shortness of breath for couple of  months. EXAM: CHEST  1 VIEW COMPARISON:  None. FINDINGS: Mild cardiac enlargement. Perihilar infiltrates bilaterally may represent edema or multifocal pneumonia. No pleural effusions. No pneumothorax. Mediastinal contours appear intact. IMPRESSION: Bilateral perihilar infiltrates. Electronically Signed   By: Lucienne Capers M.D.   On: 05/14/2021 23:11   CT Angio Chest PE W/Cm &/Or Wo Cm  Result Date: 05/15/2021 CLINICAL DATA:  Hemoptysis EXAM: CT ANGIOGRAPHY CHEST WITH CONTRAST TECHNIQUE: Multidetector CT imaging of the chest was performed using the standard protocol during bolus administration of intravenous contrast. Multiplanar CT image reconstructions and MIPs were obtained to evaluate the vascular anatomy. CONTRAST:   172mL OMNIPAQUE IOHEXOL 350 MG/ML SOLN COMPARISON:  None. FINDINGS: Cardiovascular: Contrast injection is sufficient to demonstrate satisfactory opacification of the pulmonary arteries to the segmental level. There is no pulmonary embolus or evidence of right heart strain. The size of the main pulmonary artery is normal. Proximal left pulmonary arteries are severely narrowed by confluent soft tissue surrounding the left hilum. The course and caliber of the aorta are normal. There is atherosclerotic calcification. Opacification decreased due to pulmonary arterial phase contrast bolus timing. There is a small pericardial effusion. Mediastinum/Nodes: There is bulky abnormal mediastinal soft tissue with in case mint of the left common carotid artery and proximal left subclavian artery. There are multiple enlarged mediastinal lymph nodes. Level 4 are node measures 10 mm. Lungs/Pleura: There is confluent soft tissue at the left hilum encasing the main left pulmonary artery and severely narrowing the proximal lobar branches. Abnormal soft tissue extends in a peribronchial pattern. There is also abnormal soft tissue at the right hilum. There is atelectasis within the right middle lobe and an area of consolidation in the lingula. Small pleural effusions. Upper Abdomen: Contrast bolus timing is not optimized for evaluation of the abdominal organs. There is an intermediate density 2.0 x 1.3 cm right adrenal nodule. There is a lower density left adrenal nodule measuring 1.5 cm. Musculoskeletal: No chest wall abnormality. No bony spinal canal stenosis. Review of the MIP images confirms the above findings. IMPRESSION: 1. No pulmonary embolus or acute aortic syndrome. 2. Bulky abnormal soft tissue at the left hilum encasing the main left pulmonary artery and severely narrowing the proximal lobar branches, consistent with malignancy. 3. Multiple enlarged mediastinal lymph nodes, consistent with lymphatic metastatic disease. 4.  Bilateral adrenal nodules, measuring up to 2.0 x 1.3 cm. The right adrenal nodule is concerning for metastatic disease. The left may be an adenoma. 5. Small pericardial effusion and small pleural effusions. 6. Consolidation in the lingula may be postobstructive. Aortic atherosclerosis (ICD10-I70.0). Electronically Signed   By: Ulyses Jarred M.D.   On: 05/15/2021 01:57   MR BRAIN W WO CONTRAST  Result Date: 05/18/2021 CLINICAL DATA:  Lung cancer diagnosis.  Staging. EXAM: MRI HEAD WITHOUT AND WITH CONTRAST TECHNIQUE: Multiplanar, multiecho pulse sequences of the brain and surrounding structures were obtained without and with intravenous contrast. CONTRAST:  12mL GADAVIST GADOBUTROL 1 MMOL/ML IV SOLN COMPARISON:  None. FINDINGS: Brain: Study suffers from motion degradation. Diffusion imaging does not show any acute or subacute infarction or other cause of restricted diffusion. No evidence of chronic small vessel disease or old cortical infarction. Very few punctate foci of T2 and FLAIR signal within the white matter. No sign of mass lesion, hemorrhage, hydrocephalus or extra-axial collection. No abnormal contrast enhancement occurs. Vascular: Major vessels at the base of the brain show flow. Skull and upper cervical spine: Negative Sinuses/Orbits: Clear/normal Other: None IMPRESSION: No evidence of  regional metastatic disease. Sensitivity is slightly limited by motion degradation and utilization of fast scanning protocol. I do not see any suspicious findings. Electronically Signed   By: Nelson Chimes M.D.   On: 05/18/2021 13:44   CT ABDOMEN PELVIS W CONTRAST  Result Date: 05/15/2021 CLINICAL DATA:  Abnormal left hilar soft tissue noted on chest CTA. This was concerning for malignancy. Additional evaluation to assess for possible metastatic disease. EXAM: CT ABDOMEN AND PELVIS WITH CONTRAST TECHNIQUE: Multidetector CT imaging of the abdomen and pelvis was performed using the standard protocol following bolus  administration of intravenous contrast. CONTRAST:  77mL OMNIPAQUE IOHEXOL 350 MG/ML SOLN COMPARISON:  Current chest CTA. FINDINGS: Lower chest: Small right effusion. Left lung base subcentimeter nodules. Small patchy areas of ground-glass opacity at the left lung base. Pericardial effusion. No change from the earlier chest CTA. Hepatobiliary: Subtle liver lesions suspicious for metastatic disease. Largest lies in the right lobe, segment 6, 1.4 cm in size. Liver normal in size and overall attenuation. Mildly distended gallbladder. Increased attenuation material in the dependent gallbladder possibly small stones or sludge. No wall thickening or inflammation. No bile duct dilation. Pancreas: Unremarkable. No pancreatic ductal dilatation or surrounding inflammatory changes. Spleen: Normal in size without focal abnormality. Adrenals/Urinary Tract: Bilateral adrenal masses, right 2.1 x 1.4 cm, left 1.1 cm size. Symmetric renal enhancement and excretion. No renal masses, stones or hydronephrosis. Normal ureters. Bladder minimally distended. Wall appears thickened. No bladder mass. Stomach/Bowel: Normal stomach. Small bowel and colon are normal in caliber. No wall thickening. No inflammation. Normal appendix suggested. No evidence of appendicitis. Vascular/Lymphatic: Mild aortic atherosclerosis.  No aneurysm. Prominent lymph nodes above the celiac axis, largest 1.2 cm in short axis. Prominent but subcentimeter retroperitoneal lymph nodes. No other enlarged nodes. Reproductive: Significant prostate enlargement elevating the bladder base. Prostate measures 6.9 cm superior to inferior by 5.8 x 5.6 cm transversely. Other: No abdominal wall hernia or abnormality. No abdominopelvic ascites. Musculoskeletal: Small area of sclerosis in the right ilium, likely a bone island. More definitive bone island noted in the anterior left femoral head. Vague sclerotic lesion along the lower endplate of T9. No osteolytic lesions. No fracture  or acute finding. IMPRESSION: 1. Subtle low-density liver lesions consistent with metastatic disease. 2. Bilateral adrenal masses suspicious for metastatic disease. 3. Prominent lymph nodes above the celiac axis suspicious for metastatic disease. 4. Subtle areas of sclerosis, right ilium and lower endplate of T9, nonspecific, possibly metastatic disease. 5. No acute findings. Electronically Signed   By: Lajean Manes M.D.   On: 05/15/2021 17:56   MR LIVER W WO CONTRAST  Result Date: 05/18/2021 CLINICAL DATA:  Inpatient. Acute respiratory failure with infiltrative left upper lobe soft tissue and bilateral mediastinal adenopathy suspicious on chest CT for lung malignancy. Indeterminate liver and bilateral adrenal lesions on CT abdomen study. EXAM: MRI ABDOMEN WITHOUT AND WITH CONTRAST TECHNIQUE: Multiplanar multisequence MR imaging of the abdomen was performed both before and after the administration of intravenous contrast. CONTRAST:  67mL GADAVIST GADOBUTROL 1 MMOL/ML IV SOLN COMPARISON:  05/15/2021 CT abdomen/pelvis and chest CT angiogram. FINDINGS: Substantially motion degraded exam, limiting assessment. Lower chest: Small right and trace left dependent pleural effusions. Geographic consolidation in the visualized lingula and right middle lobe. Hepatobiliary: Normal liver size and configuration. No hepatic steatosis. There are several (at least 5) mildly T2 hyperintense T1 hypointense hypoenhancing liver masses scattered throughout the liver with restricted diffusion, compatible with metastatic disease, largest 1.5 x 1.3 cm in the segment 6  right liver (series 4/image 20), 1.3 x 1.1 cm in the anterior segment 4 left liver (series 4/image 18) and 1.1 x 1.1 cm in the segment 8 right liver (series 4/image 9). Normal gallbladder with no cholelithiasis. No biliary ductal dilatation. Common bile duct diameter 4 mm. No choledocholithiasis. No biliary masses, strictures or beading. Pancreas: No pancreatic mass or duct  dilation.  No pancreas divisum. Spleen: Normal size. No mass. Adrenals/Urinary Tract: Enhancing right adrenal 2.5 x 1.7 cm nodule (series 3/image 18) demonstrates no significant loss of signal intensity on chemical shift imaging and is indeterminate. Similar enhancing left adrenal 1.5 x 1.3 cm nodule (series 3/image 23), too small to characterize for intralesional lipid on the motion degraded chemical shift images. No hydronephrosis. Scattered bilateral simple renal cysts, largest 1.6 cm in the anterior upper left kidney. T2 hypointense T1 hyperintense 1.1 cm renal cortical lesion in the lower right kidney (series 4/image 33) without compelling enhancement on the motion degraded postcontrast sequences, compatible with hemorrhagic/proteinaceous Bosniak category 2 renal cyst. No overtly suspicious renal masses. Stomach/Bowel: Normal non-distended stomach. Visualized small and large bowel is normal caliber, with no bowel wall thickening. Vascular/Lymphatic: Normal caliber abdominal aorta. Patent portal, splenic, hepatic and renal veins. A few mildly enlarged left para-aortic nodes, largest 1.2 cm short axis diameter (series 18/image 66). Enlarged 1.5 cm high left retroperitoneal node anterior to the left diaphragmatic crus (series 18/image 37). Other: No abdominal ascites or focal fluid collection. Musculoskeletal: Enhancing inferior T9 vertebral 2.8 cm and right T10 vertebral 1.1 cm bone lesions (series 17/image 19). Enhancing 2.1 cm medial right iliac bone lesion (series 17/image 19). IMPRESSION: 1. Limited motion degraded scan. 2. Several (at least 5) hypoenhancing liver masses scattered throughout the liver, with MRI features compatible with metastatic disease. Percutaneous biopsy of the 1.5 cm segment 6 right liver or 1.3 cm anterior segment 4 left liver lesions could be considered. 3. Indeterminate enhancing bilateral adrenal nodules, measuring 2.5 cm on the right and 1.5 cm on the left. Metastatic disease is not  excluded. 4. Left para-aortic and high left retroperitoneal lymphadenopathy suspicious for metastatic disease. 5. Enhancing T9 and T10 vertebral and medial right iliac bone lesions, suspicious for bone metastases. 6. Small right and trace left dependent pleural effusions. Geographic consolidation in the visualized lingula and right middle lobe, nonspecific, please see recent chest CT angiogram study for details. Electronically Signed   By: Ilona Sorrel M.D.   On: 05/18/2021 16:39    Labs:  CBC: Recent Labs    05/14/21 2306 05/17/21 0308 05/19/21 0815  WBC 10.5 15.0* 17.5*  HGB 14.4 14.8 15.3  HCT 42.6 44.8 46.9  PLT 185 209 207    COAGS: Recent Labs    05/19/21 0815  INR 1.0    BMP: Recent Labs    05/14/21 2306 05/17/21 0308  NA 138 137  K 3.4* 4.0  CL 99 98  CO2 29 27  GLUCOSE 108* 153*  BUN 12 18  CALCIUM 9.0 9.3  CREATININE 0.58* 0.60*  GFRNONAA >60 >60    LIVER FUNCTION TESTS: Recent Labs    05/14/21 2306  BILITOT 1.1  AST 30  ALT 33  ALKPHOS 100  PROT 6.7  ALBUMIN 3.7   Assessment and Plan: Shortness of breath with cough since end of June, progressively getting worse and presented to ED 8/20 Hemoptysis with imaging done 8/21 with concern for malignancy in lungs, liver lesions and lymphadenopathy  IR received request for image guided liver lesion biopsy with  sedation  Imaging reviewed today with my attending, Dr. Kathlene Cote and lesions are small but amendable to biopsy We have concern for the patient tolerating the biopsy given his respiratory status and inability to lay flat for procedure and the post procedure recovery period. There is also concern for sedation given his respiratory status and anesthesia would need to be consulted.   Upon reviewing CT imaging there is a 11 x 14 mm left supraclavicular lymph node seen. We will order an US soft tissue neck to see if this is amendable to biopsy today.  The plan was discussed with the ordering provider.     Thank you for this interesting consult.  I greatly enjoyed meeting AUDIEL SCHEIBER and look forward to participating in their care.  A copy of this report was sent to the requesting provider on this date.  Electronically Signed: Hedy Jacob, PA-C 05/19/2021, 12:02 PM   I spent a total of 40 Minutes in face to face in clinical consultation, greater than 50% of which was counseling/coordinating care for liver lesions.

## 2021-05-20 DIAGNOSIS — R160 Hepatomegaly, not elsewhere classified: Secondary | ICD-10-CM

## 2021-05-20 DIAGNOSIS — Z515 Encounter for palliative care: Secondary | ICD-10-CM

## 2021-05-20 DIAGNOSIS — Z7189 Other specified counseling: Secondary | ICD-10-CM

## 2021-05-20 DIAGNOSIS — R591 Generalized enlarged lymph nodes: Secondary | ICD-10-CM

## 2021-05-20 LAB — CULTURE, BLOOD (ROUTINE X 2)
Culture: NO GROWTH
Culture: NO GROWTH

## 2021-05-20 NOTE — TOC Progression Note (Addendum)
Transition of Care [facility]) - Progression Note    Patient Details  Name: Derek Clarke MRN: [id number] Date of Birth: 05-29-1956  [facility] CM/SW Contact  Anselm Pancoast, RN Phone Number: 05/20/2021, 4:12 PM  Clinical Narrative:    Per MD patient is not Hospice appropriate at this time.   According to patient-served in [employer]-possible assistance through [place]. Will outreach to [place] for possible resources.   Sent secure email to [employer] [facility] requesting assistance for possible [facility] resources.       Expected Discharge Plan and Services                                                 Social Determinants of Health (SDOH) Interventions    Readmission Risk Interventions No flowsheet data found.

## 2021-05-20 NOTE — Progress Notes (Addendum)
SATURATION QUALIFICATIONS: (This note is used to comply with regulatory documentation for home oxygen)  Patient Saturations on Room Air at Rest = 88%  Patient Saturations on Room Air while Ambulating = 85%  Patient Saturations on 2 Liters of oxygen while Ambulating = 92%  Please briefly explain why patient needs home oxygen: to maintain O2 saturation above 90%

## 2021-05-20 NOTE — Progress Notes (Addendum)
PROGRESS NOTE    Derek Clarke   EPP:[id number]  DOB: 05/05/1956  DOA: 05/14/2021 PCP: Merryl Hacker, No   Brief Narrative:  Derek Clarke is a 66 year old male who is a smoker and has no PCP.  The patient states that he has had a cough and shortness of breath for 2-3 months and noticed that he was coughing up blood 3 to 4 days ago.  He is not able to lay flat due to shortness of breath and also states that he is short of breath while sitting up. CTA chest: Bulky abnormal soft tissue in the left hilum encasing the main left pulmonary artery and severely narrowing of the proximal lobar branches. 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 Bilateral adrenal nodules measuring 2 x 1.3 cm.  The right nodule is concerning for metastatic disease.  The left nodule may be an adenoma.  Small pericardial and small pleural effusions.  Consolidation in the lingula may be postobstructive.   He admits to smoking 1 pack/day but states that he stopped smoking about a month ago.  He has been taking aspirin for chest pain up to 2 or 3 a day and last took an aspirin yesterday.  He is admitted for further work-up  8/22: Pulmonary not planning any bronchoscopy while here and recommends to continue antibiotic treatment.  Oncology was also consulted for concern of metastatic disease, patient had his MRI brain and liver yesterday.  8/25: MRI brain without any acute abnormality.  MRI liver with multiple liver and adrenal lesions, concern of metastatic disease, multiple lymph node involvement.  Discussed with IR and oncology, initial plan was to obtain a liver biopsy, later found to have an abnormal left supraclavicular lymph node with Easy Access which was biopsied by IR today.  Unsafe discharge as patient is very anxious to live alone, trying to get hold of brother with home he was apparently living, no contact information?? Also need home oxygen-otherwise no need to stay  inpatient.  Palliative care was also consulted today.  Subjective: Patient was sleeping comfortably in chair when I entered the room.  On awakening he was again complaining of clogging of throat and inability to breathe, appears more anxious. Talked about family and he said that he is looking for his brother and trying to mail a letter to him?? Had a discussion with TOC to help.  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   - Pulmonary recommends to continue antibiotics for treatment of pneumonia and outpatient CT in 4 to 6 weeks, per Dr.Kasa's note he does not think that it is malignancy and they want to treated as infection first.  Procalcitonin negative, imaging is very concerning for metastatic disease. Completed a 5-day course of antibiotics. -Decrease the dose of steroid so we can stop in next couple of days. -CT abdomen pelvis shows bilateral adrenal masses, right ilium and T9 mets and prominent lymph nodes in the celiac axis worrisome for metastatic disease -Oncology was consulted.  MRI brain was without any acute abnormality but MRI liver with concern of metastatic disease. -Left supraclavicular abnormal lymph node was biopsied by IR today-pending pathology -Will need PET scan as an outpatient.    Active Problems:    Acute respiratory failure with hypoxia  -His pulse ox was 88% on room air at rest - Continue oxygen  to keep pulse ox in the 90s.  He is requiring 4 L oxygen for now -Wean as tolerated -Anxiety might be playing some role. -Will need home oxygen  Hypokalemia.  Resolved. Replete and recheck    Nicotine dependence -He admits that he was smoking up to 1 pack/day for many years and states he stopped about 1 month ago  DVT prophylaxis: SCDs Start: 05/15/21 0308  Code Status: full  Family  Communication: No family listed Level of Care: Level of care: Med-Surg Disposition Plan: Clinically stable as oxygen requirement remained stable.  TOC is trying to locate family and to get home oxygen. Status is: Inpatient  Remains inpatient appropriate because:Inpatient level of care appropriate due to severity of illness  Dispo: The patient is from: Home              Anticipated d/c is to: Home              Patient currently is medically stable-unsafe discharge     Difficult to place patient Yes    Consultants:  Pulmonary Oncology Procedures:  Lymph node biopsy Antimicrobials:  Anti-infectives (From admission, onward)    Start     Dose/Rate Route Frequency Ordered Stop   05/17/21 1000  azithromycin (ZITHROMAX) tablet 250 mg  Status:  Discontinued       See Hyperspace for full Linked Orders Report.   250 mg Oral Daily 05/16/21 1339 05/19/21 1232   05/16/21 1500  cefTRIAXone (ROCEPHIN) 1 g in sodium chloride 0.9 % 100 mL IVPB        1 g 200 mL/hr over 30 Minutes Intravenous Every 24 hours 05/16/21 1339 05/20/21 0350   05/16/21 1430  azithromycin (ZITHROMAX) tablet 500 mg       See Hyperspace for full Linked Orders Report.   500 mg Oral Daily 05/16/21 1339 05/16/21 1542   05/15/21 1300  cefTRIAXone (ROCEPHIN) 1 g in sodium chloride 0.9 % 100 mL IVPB        1 g 200 mL/hr over 30 Minutes Intravenous  Once 05/15/21 1256 05/15/21 1457   05/15/21 0315  cefTRIAXone (ROCEPHIN) 2 g in sodium chloride 0.9 % 100 mL IVPB  Status:  Discontinued        2 g 200 mL/hr over 30 Minutes Intravenous Every 24 hours 05/15/21 0309 05/15/21 0314   05/15/21 0315  azithromycin (ZITHROMAX) 500 mg in sodium chloride 0.9 % 250 mL IVPB  Status:  Discontinued        500 mg 250 mL/hr over 60 Minutes Intravenous Every 24 hours 05/15/21 0309 05/15/21 0314   05/14/21 2330  cefTRIAXone (ROCEPHIN) 1 g in sodium chloride 0.9 % 100 mL IVPB        1 g 200 mL/hr over 30 Minutes Intravenous  Once 05/14/21 2329  05/15/21 0033   05/14/21 2330  azithromycin (ZITHROMAX) 500 mg in sodium chloride 0.9 % 250 mL IVPB        500 mg 250 mL/hr over 60 Minutes Intravenous  Once 05/14/21 2329 05/15/21 0153        Objective: Vitals:   05/20/21 0719 05/20/21 0749 05/20/21 1211 05/20/21 1359  BP:  (!) 124/92 112/61   Pulse:  98 (!) 105   Resp:  (!) 22 20   Temp:  98.2 F (36.8 C) 98.2 F (36.8 C)   TempSrc:      SpO2: 92% 94% 94% 92%  Weight:      Height:        Intake/Output Summary (  Last 24 hours) at 05/20/2021 1421 Last data filed at 05/20/2021 1035 Gross per 24 hour  Intake 480 ml  Output 500 ml  Net -20 ml    Filed Weights   05/14/21 2243 05/16/21 1639  Weight: 102.1 kg 106.1 kg    Examination: General. Well-developed, anxious gentleman, In no acute distress. Pulmonary.  Lungs clear bilaterally, normal respiratory effort, really decreased breath sounds on left. CV.  Regular rate and rhythm, no JVD, rub or murmur. Abdomen.  Soft, nontender, nondistended, BS positive. CNS.  Alert and oriented x3.  No focal neurologic deficit. Extremities.  No edema, no cyanosis, pulses intact and symmetrical. Psychiatry.  Judgment and insight appears normal.   Data Reviewed: I have personally reviewed following labs and imaging studies  CBC: Recent Labs  Lab 05/14/21 2306 05/17/21 0308 05/19/21 0815  WBC 10.5 15.0* 17.5*  NEUTROABS 8.4*  --   --   HGB 14.4 14.8 15.3  HCT 42.6 44.8 46.9  MCV 85.4 85.7 88.3  PLT 185 209 425    Basic Metabolic Panel: Recent Labs  Lab 05/14/21 2306 05/17/21 0308  NA 138 137  K 3.4* 4.0  CL 99 98  CO2 29 27  GLUCOSE 108* 153*  BUN 12 18  CREATININE 0.58* 0.60*  CALCIUM 9.0 9.3    GFR: Estimated Creatinine Clearance: 112 mL/min (A) (by C-G formula based on SCr of 0.6 mg/dL (L)). Liver Function Tests: Recent Labs  Lab 05/14/21 2306  AST 30  ALT 33  ALKPHOS 100  BILITOT 1.1  PROT 6.7  ALBUMIN 3.7    No results for input(s): LIPASE, AMYLASE  in the last 168 hours. No results for input(s): AMMONIA in the last 168 hours. Coagulation Profile: Recent Labs  Lab 05/19/21 0815  INR 1.0    Cardiac Enzymes: No results for input(s): CKTOTAL, CKMB, CKMBINDEX, TROPONINI in the last 168 hours. BNP (last 3 results) No results for input(s): PROBNP in the last 8760 hours. HbA1C: No results for input(s): HGBA1C in the last 72 hours. CBG: No results for input(s): GLUCAP in the last 168 hours. Lipid Profile: No results for input(s): CHOL, HDL, LDLCALC, TRIG, CHOLHDL, LDLDIRECT in the last 72 hours. Thyroid Function Tests: No results for input(s): TSH, T4TOTAL, FREET4, T3FREE, THYROIDAB in the last 72 hours. Anemia Panel: No results for input(s): VITAMINB12, FOLATE, FERRITIN, TIBC, IRON, RETICCTPCT in the last 72 hours. Urine analysis: No results found for: COLORURINE, APPEARANCEUR, LABSPEC, PHURINE, GLUCOSEU, HGBUR, BILIRUBINUR, KETONESUR, PROTEINUR, UROBILINOGEN, NITRITE, LEUKOCYTESUR Sepsis Labs: @LABRCNTIP (procalcitonin:4,lacticidven:4) ) Recent Results (from the past 240 hour(s))  Resp Panel by RT-PCR (Flu A&B, Covid) Nasopharyngeal Swab     Status: None   Collection Time: 05/14/21 11:06 PM   Specimen: Nasopharyngeal Swab; Nasopharyngeal(NP) swabs in vial transport medium  Result Value Ref Range Status   SARS Coronavirus 2 by RT PCR NEGATIVE NEGATIVE Final    Comment: (NOTE) SARS-CoV-2 target nucleic acids are NOT DETECTED.  The SARS-CoV-2 RNA is generally detectable in upper respiratory specimens during the acute phase of infection. The lowest concentration of SARS-CoV-2 viral copies this assay can detect is 138 copies/mL. A negative result does not preclude SARS-Cov-2 infection and should not be used as the sole basis for treatment or other patient management decisions. A negative result may occur with  improper specimen collection/handling, submission of specimen other than nasopharyngeal swab, presence of viral  mutation(s) within the areas targeted by this assay, and inadequate number of viral copies(<138 copies/mL). A negative result must be combined with  clinical observations, patient history, and epidemiological information. The expected result is Negative.  Fact Sheet for Patients:  EntrepreneurPulse.com.au  Fact Sheet for Healthcare Providers:  IncredibleEmployment.be  This test is no t yet approved or cleared by the [place] FDA and  has been authorized for detection and/or diagnosis of SARS-CoV-2 by FDA under an Emergency Use Authorization (EUA). This EUA will remain  in effect (meaning this test can be used) for the duration of the COVID-19 declaration under Section 564(b)(1) of the Act, 21 U.S.C.section 360bbb-3(b)(1), unless the authorization is terminated  or revoked sooner.       Influenza A by PCR NEGATIVE NEGATIVE Final   Influenza B by PCR NEGATIVE NEGATIVE Final    Comment: (NOTE) The Xpert Xpress SARS-CoV-2/FLU/RSV plus assay is intended as an aid in the diagnosis of influenza from Nasopharyngeal swab specimens and should not be used as a sole basis for treatment. Nasal washings and aspirates are unacceptable for Xpert Xpress SARS-CoV-2/FLU/RSV testing.  Fact Sheet for Patients: EntrepreneurPulse.com.au  Fact Sheet for Healthcare Providers: IncredibleEmployment.be  This test is not yet approved or cleared by the [place] FDA and has been authorized for detection and/or diagnosis of SARS-CoV-2 by FDA under an Emergency Use Authorization (EUA). This EUA will remain in effect (meaning this test can be used) for the duration of the COVID-19 declaration under Section 564(b)(1) of the Act, 21 U.S.C. section 360bbb-3(b)(1), unless the authorization is terminated or revoked.  Performed at [facility], [address] [place], [place] [address]   Culture, blood (routine x 2)      Status: None   Collection Time: 05/15/21 12:07 AM   Specimen: BLOOD  Result Value Ref Range Status   Specimen Description BLOOD RIGHT FOREARM  Final   Special Requests   Final    BOTTLES DRAWN AEROBIC AND ANAEROBIC Blood Culture results may not be optimal due to an excessive volume of blood received in culture bottles   Culture   Final    NO GROWTH 5 DAYS Performed at [facility], [address] [place], [place] [address]    Report Status 05/20/2021 FINAL  Final  Culture, blood (routine x 2)     Status: None   Collection Time: 05/15/21 12:07 AM   Specimen: BLOOD  Result Value Ref Range Status   Specimen Description BLOOD LEFT FOREARM  Final   Special Requests   Final    BOTTLES DRAWN AEROBIC AND ANAEROBIC Blood Culture results may not be optimal due to an excessive volume of blood received in culture bottles   Culture   Final    NO GROWTH 5 DAYS Performed at [facility], [address] [place], [place] [address]    Report Status 05/20/2021 FINAL  Final  MRSA Next Gen by PCR, Nasal     Status: None   Collection Time: 05/15/21 11:36 PM   Specimen: Nasal Mucosa; Nasal Swab  Result Value Ref Range Status   MRSA by PCR Next Gen NOT DETECTED NOT DETECTED Final    Comment: (NOTE) The GeneXpert MRSA Assay (FDA approved for NASAL specimens only), is one component of a comprehensive MRSA colonization surveillance program. It is not intended to diagnose MRSA infection nor to guide or monitor treatment for MRSA infections. Test performance is not FDA approved in patients less than 15 years old. Performed at [facility], [address], [place], [place] [address]       Radiology Studies: [place] CORE BIOPSY (LYMPH NODES)  Result Date: 05/20/2021 INDICATION: Possible lung carcinoma by prior imaging with vague liver lesions and also enlarged left supraclavicular lymph node by CT. Due to oxygen requirement and poor respiratory status, biopsy of the left  supraclavicular lymph node is performed without sedation as tolerance a liver biopsy currently with sedation would be borderline and potentially high risk. EXAM: ULTRASOUND GUIDED CORE BIOPSY OF LEFT SUPRACLAVICULAR LYMPH NODE MEDICATIONS: None. ANESTHESIA/SEDATION: None PROCEDURE: The procedure, risks, benefits, and alternatives were explained to the patient. Questions regarding the procedure were encouraged and answered. The patient understands and consents to the procedure. A time-out was performed prior to initiating the procedure. The left neck was prepped with chlorhexidine in a sterile fashion, and a sterile drape was applied covering the operative field. A sterile gown and sterile gloves were used for the procedure. Local anesthesia was provided with 1% Lidocaine. After localizing an enlarged left supraclavicular lymph node, an 18 gauge core biopsy device was utilized in obtaining 4 separate core biopsy samples. Material was submitted in formalin. Additional ultrasound was performed. COMPLICATIONS: None immediate. FINDINGS: Several left supraclavicular and lower cervical lymph nodes are identified. The largest measures approximately 1.7 x 1.3 x 1.4 cm by ultrasound. Solid core biopsy samples were obtained from this lymph node. IMPRESSION: Ultrasound-guided core biopsy performed enlarged left supraclavicular lymph node. Electronically Signed   By: Aletta Edouard M.D.   On: 05/20/2021 11:02      Scheduled Meds:  budesonide (PULMICORT) nebulizer solution  0.5 mg Nebulization BID   feeding supplement  237 mL Oral BID BM   guaiFENesin  600 mg Oral BID   ipratropium-albuterol  3 mL Nebulization Q6H   multivitamin with minerals  1 tablet Oral Daily   polyethylene glycol  17 g Oral Daily   predniSONE  20 mg Oral Q breakfast   Continuous Infusions:    LOS: 5 days   Time spent.  34 minutes.  More than 50% of that time was spent in direct patient care and coordination of care.  Lorella Nimrod,  MD [facility] Pager: [email or web address] 05/20/2021, 2:21 PM

## 2021-05-20 NOTE — TOC Progression Note (Signed)
Transition of Care [facility]) - Progression Note    Patient Details  Name: Derek Clarke MRN: [id number] Date of Birth: 02/21/1956  Transition [facility]) CM/SW [place], RN Phone Number: 05/20/2021, 1:47 PM  Clinical Narrative:   Thedore Mins at [facility] notified that patient will require home O2.  Patient will try to contact his cousin to contact his brother to take him home, but he is not able to do so at this time.  Care nurse aware, she will attempt to contact cousin when patient has O2 and is ready to discharge.          Expected Discharge Plan and Services                                                 Social Determinants of Health (SDOH) Interventions    Readmission Risk Interventions No flowsheet data found.

## 2021-05-20 NOTE — Plan of Care (Signed)
Patient alert and oriented x 4, denies pain with assess. No adverse events during shift. Vitals stable, remains on 4 liters humidified oxygen via nasal canula. Dyspnea on exertion with expressive aphasia and slow responses. Tolerated medications po with applesauce. Stable condition at the end of shift, will continue to monitor.  Problem: Education: Goal: Knowledge of General Education information will improve Description: Including pain rating scale, medication(s)/side effects and non-pharmacologic comfort measures Outcome: Progressing   Problem: Health Behavior/Discharge Planning: Goal: Ability to manage health-related needs will improve Outcome: Progressing   Problem: Clinical Measurements: Goal: Ability to maintain clinical measurements within normal limits will improve Outcome: Progressing Goal: Will remain free from infection Outcome: Progressing Goal: Diagnostic test results will improve Outcome: Progressing Goal: Respiratory complications will improve Outcome: Progressing Goal: Cardiovascular complication will be avoided Outcome: Progressing   Problem: Nutrition: Goal: Adequate nutrition will be maintained Outcome: Progressing   Problem: Coping: Goal: Level of anxiety will decrease Outcome: Progressing   Problem: Elimination: Goal: Will not experience complications related to bowel motility Outcome: Progressing Goal: Will not experience complications related to urinary retention Outcome: Progressing   Problem: Pain Managment: Goal: General experience of comfort will improve Outcome: Progressing   Problem: Safety: Goal: Ability to remain free from injury will improve Outcome: Progressing   Problem: Skin Integrity: Goal: Risk for impaired skin integrity will decrease Outcome: Progressing

## 2021-05-20 NOTE — Consult Note (Signed)
Palliative Care Consult Note                                  Date: 05/20/2021   Patient Name: Derek Clarke  DOB: 03-12-1956  MRN: [id number]  Age / Sex: 65 y.o., male  PCP: Pcp, No Referring Physician: Lorella Nimrod, MD  Reason for Consultation: Establishing goals of care  HPI/Patient Profile: Palliative Care consult requested for goals of care discussion in this 65 y.o. male history of tobacco use.  He was admitted on 05/14/2021 from home with complaints of congested cough and shortness of breath.  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  RI of brain showed no acute abnormalities.  Patient has been seen and evaluated by oncology for concerns of metastatic disease.  Patient is s/p left supraclavicular lymph node biopsy 8/25.  History reviewed. No pertinent past medical history.   Subjective:   This NP Derek Clarke reviewed medical records, received report from team, assessed the patient and then met at the patient's bedside with patient to discuss diagnosis, prognosis, GOC, EOL wishes disposition and options.   Derek Clarke is sitting up in recliner.  Currently on 4 L nasal cannula.  Noticeable shortness of breath during conversations.  Denies pain.  Endorses significant fatigue.  Speaks in a pressured whisper. Alert and oriented x3.   Concept of Palliative Care was introduced as specialized medical care for people and their families living with serious illness.  It focuses on providing relief from the symptoms and stress of a serious illness.  The goal is to improve quality of life for both the patient and the family. Values and goals of care important to patient and family were attempted to be elicited.  I created space and  opportunity for patient to explore state of health prior to admission, thoughts, and feelings.  Patient shares he lives in the home alone.  He has never married or had any children.  Prior to admission he was able to ambulate independently without assistive devices however does endorse weakness and increased shortness of breath with exertion.  Able to perform all ADLs independently.  Was actively still driving.  Appetite has increased over the past 6-8 weeks.  He states he is only able to eat a small amount of food and if he eats more he began to have discomfort in his upper abdomen chest area with feelings of fullness.  Patient states his next of kin and primary family support is his brother Derek Clarke who resides in [place].   We discussed His current illness and what it means in the larger context of His on-going co-morbidities. Natural disease trajectory and expectations were discussed.  Derek Clarke verbalized understanding of current illness and co-morbidities. He states he is anxiously awaiting biopsy results and next steps for him. He shares that he stopped smoking over 2 months ago when he began noticing symptoms of throat tightness and shortness of breath.   I discussed the importance of continued conversation with family and their medical providers regarding overall plan of care and treatment options, ensuring decisions are within the context of the patients values and GOCs.  Questions and concerns were addressed.  Patient was encouraged to call with questions or concerns.  PMT will continue to  support holistically as needed.  Life Review: Derek Clarke lives alone.  Never married.  No children.  Served in the [employer] for more than 6 years.  Worked in Scientist, research (life sciences).  He is a high school graduate and was 3 courses shy of obtaining his associates degree.   Objective:   Primary Diagnoses: Present on Admission: **None**   Scheduled Meds: . budesonide (PULMICORT) nebulizer solution  0.5 mg Nebulization  BID  . feeding supplement  237 mL Oral BID BM  . guaiFENesin  600 mg Oral BID  . ipratropium-albuterol  3 mL Nebulization Q6H  . multivitamin with minerals  1 tablet Oral Daily  . polyethylene glycol  17 g Oral Daily  . predniSONE  20 mg Oral Q breakfast    Continuous Infusions:   PRN Meds: albuterol, ibuprofen, phenol  No Known Allergies  Review of Systems  Constitutional:  Positive for fatigue.  HENT:  Positive for sore throat and voice change.   Respiratory:  Positive for cough and shortness of breath.   Psychiatric/Behavioral:  The patient is nervous/anxious.   Unless otherwise noted, a complete review of systems is negative.  Physical Exam General: NAD, well developed Cardiovascular: regular rate and rhythm Pulmonary: Congested cough, diminished bilaterally, voice raspy Abdomen: soft, nontender, + bowel sounds Extremities: no edema, no joint deformities Skin: no rashes, warm and dry Neurological: AA O x3, seems anxious, fidgeting with items, hands shaking, and limited eye contact  Vital Signs:  BP 112/61 (BP Location: Left Arm)   Pulse (!) 105   Temp 98.2 F (36.8 C)   Resp 20   Ht $R'5\' 9"'BB$  (1.753 m)   Wt 106.1 kg   SpO2 92%   BMI 34.56 kg/m  Pain Scale: 0-10 POSS *See Group Information*: 1-Acceptable,Awake and alert Pain Score: 0-No pain  SpO2: SpO2: 92 % O2 Device:SpO2: 92 % O2 Flow Rate: .O2 Flow Rate (L/min): 4 L/min  IO: Intake/output summary:  Intake/Output Summary (Last 24 hours) at 05/20/2021 1448 Last data filed at 05/20/2021 1035 Gross per 24 hour  Intake 480 ml  Output 500 ml  Net -20 ml    LBM: Last BM Date: 05/19/21 Baseline Weight: Weight: 102.1 kg Most recent weight: Weight: 106.1 kg      Palliative Assessment/Data: PPS 30%   Advanced Care Planning:   Primary Decision Maker: NEXT OF KIN  Code Status/Advance Care Planning: Full code  A discussion was had today regarding advanced directives. Concepts specific to code status,  artifical feeding and hydration, continued IV antibiotics and rehospitalization was had.   I discussed at length patient's full CODE STATUS with consideration of his current illness and comorbidities.  Patient is requesting to remain a full code.  He states he would like every attempt to be made to allow him an opportunity to survive however with limitations for no prolonged life-sustaining measures, no tracheostomy, and if no viable quality of life or unable to wean would then wish for all care to focus on his comfort.  Patient states he does not have an advanced directive however his brother Zakariyah Freimark would be his designated Education officer, community.  I encouraged patient to consider completing advanced directives.  He is aware this can be completed while hospitalized.  Mr. Cosma is clear in his expressed wishes for full code, full scope care.  He is anxiously awaiting biopsy results and following up with oncology for all available options.  Shares he is open to any and all oncology interventions again emphasizing his will to  continue to thrive.  Patient states he would not want a long-term feeding tube however if temporarily and he will have some quality of life (not to bedbound) he would consider time trial, prefers to make decisions regarding his health as necessary.    Palliative Care services outpatient were explained and offered. Patient verbalized understanding and awareness of both palliative's goals and philosophy of care.  Recommendations for patient to follow up with my colleague Billey Chang, NP for ongoing palliative support and collaboration with oncology at [facility].   Assessment & Plan:   SUMMARY OF RECOMMENDATIONS   Full Code-as confirmed by patient Continue with current plan of care, full scope, treat the treatable Patient is clear in expressed wishes to proceed with any and all recommended medical interventions/treatments to allow him every opportunity to thrive. He is  remaining hopeful for treatment options and open to all Oncological interventions and therapies.  Patient lives in the home alone and will need additional support with anticipated metastatic diagnosis, increased fatigue, and shortness of breath. States his brother lives in [place]. (Secured chat to [place]) sharing conversation with patient.  Recommendations for patient to follow-up with Palliative at [facility] in collaboration with Dr. Grayland Ormond. ( I have secured chat colleague Billey Chang, NP with request).  Patient is anxious and expressed he has not been sleeping well. Would recommend initiating low dose ativan or  trazodone at night as needed.   Encouraged consideration to complete advanced directive while hospitalized. He will notify nurse if he is interested. Names his brother Horst Ostermiller as Scientist, research (medical).  PMT will continue to support and follow as needed. Goals are clear and set. NO WEEKEND COVERAGE. Please call team line with urgent needs.  Symptom Management:  Per Attending  Palliative Prophylaxis:  Aspiration, Frequent Pain Assessment, and Oral Care  Additional Recommendations (Limitations, Scope, Preferences): Full Scope Treatment, No Tracheostomy, and Treat the treatable,   Psycho-social/Spiritual:  Desire for further Chaplaincy support: no Additional Recommendations:  Ongoing goals of care support.   Prognosis:  Guarded  Discharge Planning:  To Be Determined   Discussed with: Secured chat Dr. Reesa Chew, [place], CM, and [place], [place].   Patient expressed understanding and was in agreement with this plan.   Time In: 8657 Time Out: 1550 Time Total: 55 min.   Visit consisted of counseling and education dealing with the complex and emotionally intense issues of symptom management and palliative care in the setting of serious and potentially life-threatening illness.Greater than 50%  of this time was spent counseling and coordinating care related to  the above assessment and plan.  Signed by:  Alda Lea, AGPCNP-BC Palliative Medicine Team  Phone: [phone] Pager: [phone] Amion: Bjorn Pippin   Thank you for allowing the Palliative Medicine Team to assist in the care of this patient. Please utilize secure chat with additional questions, if there is no response within 30 minutes please call the above phone number. Palliative Medicine Team providers are available by phone from 7am to 5pm daily and can be reached through the team cell phone.  Should this patient require assistance outside of these hours, please call the patient's attending physician.

## 2021-05-20 NOTE — Progress Notes (Signed)
PT worked with pt o2 sats on RA 85-88% 2L [place] 88-89% Ambulating 40 in room on 3L [place] 92% Ambulating earlier in the shift on 4L [place] 94%

## 2021-05-20 NOTE — TOC Progression Note (Addendum)
Transition of Care [facility]) - Progression Note    Patient Details  Name: Derek Clarke MRN: [id number] Date of Birth: 1955-09-29  Transition of Care [facility]) CM/SW [place], RN Phone Number: 05/20/2021, 1:50 PM  Clinical Narrative:    Jiles Garter, RN CM requested order for charity home O2 and this writer sent request for charity [facility] via [employer]. Waiting for response with availability.   No contact info for brother who lives with patient. Will plan to discharge patient to his home that he shares with the brother once medically cleared.   [facility] is unable to accept patient.   Spoke to [employer] for possible home assistance. Dawn states she will review patient and research if any charity resources available.         Expected Discharge Plan and Services                                                 Social Determinants of Health (SDOH) Interventions    Readmission Risk Interventions No flowsheet data found.

## 2021-05-20 NOTE — Progress Notes (Signed)
Physical Therapy Treatment Patient Details Name: Derek Clarke MRN: [id number] DOB: 12-26-1955 Today's Date: 05/20/2021    History of Present Illness Pt is a 65 y.o. male presenting to hospital 8/20 with SOB and cough x2 months (worsening past several days and acutely worse tonight); also reporting hemoptysis.  Pt admitted with acute respiratory failure with hypoxia, hemoptysis, mass of L lung on CT (suspect malignancy); concern for metastatic disease.    PT Comments    Pt sitting in recliner upon PT arrival; agreeable to PT session.  Checked pt's O2 on room air with ambulation per nursing request.  Pt ambulated 40 feet x3 trials during session (pt steady and safe with functional mobility but SOB noted with activity).  1st ambulation trial (on room air) pt's O2 sats 85-86% post ambulation.  Next ambulation trial (on 2 L) pt's O2 sats 88-89% post ambulation.  Last ambulation trial (on 3 L) pt's O2 sats 92% post ambulation.  Will continue to focus on increasing activity tolerance during hospital stay.    Follow Up Recommendations  No PT follow up     Equipment Recommendations  None recommended by PT    Recommendations for Other Services OT consult     Precautions / Restrictions Precautions Precautions: Fall Precaution Comments: Metastatic disease Restrictions Weight Bearing Restrictions: No    Mobility  Bed Mobility               General bed mobility comments: deferred (pt sitting in recliner beginning/end of session)    Transfers Overall transfer level: Independent Equipment used: None Transfers: Sit to/from Stand Sit to Stand: Independent         General transfer comment: steady safe transfers  Ambulation/Gait Ambulation/Gait assistance: Independent Gait Distance (Feet):  (40 feet x3) Assistive device: None Gait Pattern/deviations: Step-through pattern Gait velocity: mildly decreased   General Gait Details: steady ambulation   Stairs              Wheelchair Mobility    Modified Rankin (Stroke Patients Only)       Balance Overall balance assessment: Independent                                          Cognition Arousal/Alertness: Awake/alert Behavior During Therapy: WFL for tasks assessed/performed Overall Cognitive Status: Within Functional Limits for tasks assessed                                 General Comments: Mild increased time to respond at times      Exercises      General Comments  Nursing cleared pt for participation in physical therapy.  Pt agreeable to PT session.      Pertinent Vitals/Pain Pain Assessment: No/denies pain Pain Intervention(s): Limited activity within patient's tolerance;Monitored during session;Repositioned HR WFL during sessions activities.    Home Living                      Prior Function            PT Goals (current goals can now be found in the care plan section) Acute Rehab PT Goals Patient Stated Goal: to improve breathing PT Goal Formulation: With patient Time For Goal Achievement: 05/30/21 Potential to Achieve Goals: Fair Progress towards PT goals: Progressing toward goals    Frequency  Min 2X/week      PT Plan Current plan remains appropriate    Co-evaluation              AM-PAC PT "6 Clicks" Mobility   Outcome Measure  Help needed turning from your back to your side while in a flat bed without using bedrails?: None Help needed moving from lying on your back to sitting on the side of a flat bed without using bedrails?: None Help needed moving to and from a bed to a chair (including a wheelchair)?: None Help needed standing up from a chair using your arms (e.g., wheelchair or bedside chair)?: None Help needed to walk in hospital room?: A Little Help needed climbing 3-5 steps with a railing? : A Little 6 Click Score: 22    End of Session Equipment Utilized During Treatment: Gait  belt;Oxygen Activity Tolerance: Patient tolerated treatment well Patient left: in chair;with call bell/phone within reach Nurse Communication: Mobility status;Precautions;Other (comment) (pt's O2 sats/O2 needs during session) PT Visit Diagnosis: Other abnormalities of gait and mobility (R26.89)     Time: 6440-3474 PT Time Calculation (min) (ACUTE ONLY): 23 min  Charges:  $Therapeutic Exercise: 23-37 mins                    Leitha Bleak, PT 05/20/21, 3:22 PM

## 2021-05-21 MED ORDER — BUDESONIDE 180 MCG/ACT IN AEPB: 1.0000 | INHALATION_SPRAY | Freq: Every day | RESPIRATORY_TRACT | 2 refills | Status: DC

## 2021-05-21 MED ORDER — COVID-19 MRNA VACC (MODERNA) 50 MCG/0.25ML IM SUSP
0.2500 mL | Freq: Once | INTRAMUSCULAR | Status: DC
  Filled 2021-05-21: qty 0.25

## 2021-05-21 MED ORDER — ALBUTEROL SULFATE HFA 108 (90 BASE) MCG/ACT IN AERS: 2.0000 | INHALATION_SPRAY | Freq: Four times a day (QID) | RESPIRATORY_TRACT | 2 refills | Status: AC | PRN

## 2021-05-21 MED ORDER — COVID-19 MRNA VAC-TRIS(PFIZER) 30 MCG/0.3ML IM SUSP
0.3000 mL | Freq: Once | INTRAMUSCULAR | Status: DC
  Filled 2021-05-21 (×2): qty 0.3

## 2021-05-21 MED ORDER — ENSURE ENLIVE PO LIQD: 237.0000 mL | Freq: Two times a day (BID) | ORAL | 12 refills | Status: DC

## 2021-05-21 MED ORDER — ADULT MULTIVITAMIN W/MINERALS CH: 1.0000 | ORAL_TABLET | Freq: Every day | ORAL | 1 refills | Status: DC

## 2021-05-21 NOTE — Plan of Care (Signed)

## 2021-05-21 NOTE — TOC Transition Note (Signed)
Transition of Care [facility]) - CM/SW Discharge Note   Patient Details  Name: ALLEN EGERTON MRN: [id number] Date of Birth: [birth year]  Transition of [facility]) CM/SW Contact:  Shelbie Hutching, RN Phone Number: 05/21/2021, 2:24 PM   Clinical Narrative:    Patient medically cleared for discharge home on oxygen.  [employer] has delivered oxygen to the patient's room, delivered yesterday.  [employer] will reach out to the patient today to arrange home set up delivery.  Brother is here at the hospital and will take patient home.     Final next level of care: Home/Self Care Barriers to Discharge: Barriers Resolved   Patient Goals and CMS Choice Patient states their goals for this hospitalization and ongoing recovery are:: Patient and patient's brother want him discharged today CMS Medicare.gov Compare Post Acute Care list provided to:: Patient Choice offered to / list presented to : Patient  Discharge Placement                       Discharge Plan and Services   Discharge Planning Services: CM Consult Post Acute Care Choice: Durable Medical Equipment          DME Arranged: Oxygen DME Agency: [employer] Date DME Agency Contacted: 05/21/21 Time DME Agency Contacted: [phone] Representative spoke with at DME Agency: [employer] representative [place]: [place] Agency: NA        Social Determinants of Health ([place]) Interventions     Readmission Risk Interventions No flowsheet data found.

## 2021-05-21 NOTE — Progress Notes (Addendum)
Discharge put in for patient, oxygen in room for patient to go home. Discharge went over with patient and brother. Patient states his throat feeling tight. O2 94% on 4L. He states this started at the end of June. Dr. Reesa Chew notified, MD states, "it's mostly anxiety" and patient can be discharged.  Patient states he would like to talk to MD and does not feel comfortably going home today. Dr. Reesa Chew aware, states she will talk with patient tomorrow. No new orders at this time.

## 2021-05-21 NOTE — Progress Notes (Signed)
PROGRESS NOTE    Derek Clarke   TKP:[id number]  DOB: 04-Dec-1955  DOA: 05/14/2021 PCP: Merryl Hacker, No   Brief Narrative:  Derek Clarke is a 65 year old male who is a smoker and has no PCP.  The patient states that he has had a cough and shortness of breath for 2-3 months and noticed that he was coughing up blood 3 to 4 days ago.  He is not able to lay flat due to shortness of breath and also states that he is short of breath while sitting up. CTA chest: Bulky abnormal soft tissue in the left hilum encasing the main left pulmonary artery and severely narrowing of the proximal lobar branches. 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 Bilateral adrenal nodules measuring 2 x 1.3 cm.  The right nodule is concerning for metastatic disease.  The left nodule may be an adenoma.  Small pericardial and small pleural effusions.  Consolidation in the lingula may be postobstructive.   He admits to smoking 1 pack/day but states that he stopped smoking about a month ago.  He has been taking aspirin for chest pain up to 2 or 3 a day and last took an aspirin yesterday.  He is admitted for further work-up  8/22: Pulmonary not planning any bronchoscopy while here and recommends to continue antibiotic treatment.  Oncology was also consulted for concern of metastatic disease, patient had his MRI brain and liver yesterday.  8/25: MRI brain without any acute abnormality.  MRI liver with multiple liver and adrenal lesions, concern of metastatic disease, multiple lymph node involvement.  Discussed with IR and oncology, initial plan was to obtain a liver biopsy, later found to have an abnormal left supraclavicular lymph node with Easy Access which was biopsied by IR today.  Unsafe discharge as patient is very anxious to live alone, trying to get hold of brother with home he was apparently living, no contact information?? Also need home oxygen-otherwise no need to stay  inpatient.  Palliative care was also consulted but patient wants to stay as full code and full scope of medical care.  Subjective: Patient was resting comfortably in chair.  Stating that his brother might visit him today.  He was hoping to stay with brother.  Respiratory status seems the same, stable on current level of oxygen.  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   - Pulmonary recommends to continue antibiotics for treatment of pneumonia and outpatient CT in 4 to 6 weeks, per Dr.Kasa's note he does not think that it is malignancy and they want to treated as infection first.  Procalcitonin negative, imaging is very concerning for metastatic disease. Completed a 5-day course of antibiotics. -Decrease the dose of steroid so we can stop in next couple of days. -CT abdomen pelvis shows bilateral adrenal masses, right ilium and T9 mets and prominent lymph nodes in the celiac axis worrisome for metastatic disease -Oncology was consulted.  MRI brain was without any acute abnormality but MRI liver with concern of metastatic disease. -Left supraclavicular abnormal lymph node was biopsied by IR today-pending pathology -Will need PET scan as an outpatient.    Active Problems:    Acute respiratory failure with hypoxia  -His pulse ox was 88% on room air at rest - Continue oxygen to keep pulse ox in the 90s.  He  is requiring 4 L oxygen for now -Wean as tolerated -Anxiety might be playing some role. -Will need home oxygen  Hypokalemia.  Resolved. Replete and recheck    Nicotine dependence -He admits that he was smoking up to 1 pack/day for many years and states he stopped about 1 month ago  DVT prophylaxis: SCDs Start: 05/15/21 0308  Code Status: full  Family Communication: No family listed Level of Care: Level  of care: Med-Surg Disposition Plan: Clinically stable as oxygen requirement remained stable.  TOC is trying to locate family and to get home oxygen. Status is: Inpatient  Remains inpatient appropriate because:Inpatient level of care appropriate due to severity of illness  Dispo: The patient is from: Home              Anticipated d/c is to: Home              Patient currently is medically stable-unsafe discharge     Difficult to place patient Yes    Consultants:  Pulmonary Oncology Procedures:  Lymph node biopsy Antimicrobials:  Anti-infectives (From admission, onward)    Start     Dose/Rate Route Frequency Ordered Stop   05/17/21 1000  azithromycin (ZITHROMAX) tablet 250 mg  Status:  Discontinued       See Hyperspace for full Linked Orders Report.   250 mg Oral Daily 05/16/21 1339 05/19/21 1232   05/16/21 1500  cefTRIAXone (ROCEPHIN) 1 g in sodium chloride 0.9 % 100 mL IVPB        1 g 200 mL/hr over 30 Minutes Intravenous Every 24 hours 05/16/21 1339 05/20/21 0350   05/16/21 1430  azithromycin (ZITHROMAX) tablet 500 mg       See Hyperspace for full Linked Orders Report.   500 mg Oral Daily 05/16/21 1339 05/16/21 1542   05/15/21 1300  cefTRIAXone (ROCEPHIN) 1 g in sodium chloride 0.9 % 100 mL IVPB        1 g 200 mL/hr over 30 Minutes Intravenous  Once 05/15/21 1256 05/15/21 1457   05/15/21 0315  cefTRIAXone (ROCEPHIN) 2 g in sodium chloride 0.9 % 100 mL IVPB  Status:  Discontinued        2 g 200 mL/hr over 30 Minutes Intravenous Every 24 hours 05/15/21 0309 05/15/21 0314   05/15/21 0315  azithromycin (ZITHROMAX) 500 mg in sodium chloride 0.9 % 250 mL IVPB  Status:  Discontinued        500 mg 250 mL/hr over 60 Minutes Intravenous Every 24 hours 05/15/21 0309 05/15/21 0314   05/14/21 2330  cefTRIAXone (ROCEPHIN) 1 g in sodium chloride 0.9 % 100 mL IVPB        1 g 200 mL/hr over 30 Minutes Intravenous  Once 05/14/21 2329 05/15/21 0033   05/14/21 2330  azithromycin (ZITHROMAX)  500 mg in sodium chloride 0.9 % 250 mL IVPB        500 mg 250 mL/hr over 60 Minutes Intravenous  Once 05/14/21 2329 05/15/21 0153        Objective: Vitals:   05/21/21 0705 05/21/21 0751 05/21/21 1120 05/21/21 1303  BP:  (!) 154/93 126/79   Pulse:  85 (!) 105   Resp:  15 15   Temp:  98.5 F (36.9 C) 97.7 F (36.5 C)   TempSrc:      SpO2: 95% 94% 95% 92%  Weight:      Height:        Intake/Output Summary (Last 24 hours) at 05/21/2021 1409 Last data filed at  05/20/2021 1847 Gross per 24 hour  Intake 0 ml  Output --  Net 0 ml    Filed Weights   05/14/21 2243 05/16/21 1639  Weight: 102.1 kg 106.1 kg    Examination: General. Well-developed, anxious gentleman, In no acute distress. Pulmonary.  Lungs clear bilaterally, normal respiratory effort, really decreased breath sounds on left. CV.  Regular rate and rhythm, no JVD, rub or murmur. Abdomen.  Soft, nontender, nondistended, BS positive. CNS.  Alert and oriented x3.  No focal neurologic deficit. Extremities.  No edema, no cyanosis, pulses intact and symmetrical. Psychiatry.  Judgment and insight appears normal.   Data Reviewed: I have personally reviewed following labs and imaging studies  CBC: Recent Labs  Lab 05/14/21 2306 05/17/21 0308 05/19/21 0815  WBC 10.5 15.0* 17.5*  NEUTROABS 8.4*  --   --   HGB 14.4 14.8 15.3  HCT 42.6 44.8 46.9  MCV 85.4 85.7 88.3  PLT 185 209 621    Basic Metabolic Panel: Recent Labs  Lab 05/14/21 2306 05/17/21 0308  NA 138 137  K 3.4* 4.0  CL 99 98  CO2 29 27  GLUCOSE 108* 153*  BUN 12 18  CREATININE 0.58* 0.60*  CALCIUM 9.0 9.3    GFR: Estimated Creatinine Clearance: 112 mL/min (A) (by C-G formula based on SCr of 0.6 mg/dL (L)). Liver Function Tests: Recent Labs  Lab 05/14/21 2306  AST 30  ALT 33  ALKPHOS 100  BILITOT 1.1  PROT 6.7  ALBUMIN 3.7    No results for input(s): LIPASE, AMYLASE in the last 168 hours. No results for input(s): AMMONIA in the last  168 hours. Coagulation Profile: Recent Labs  Lab 05/19/21 0815  INR 1.0    Cardiac Enzymes: No results for input(s): CKTOTAL, CKMB, CKMBINDEX, TROPONINI in the last 168 hours. BNP (last 3 results) No results for input(s): PROBNP in the last 8760 hours. HbA1C: No results for input(s): HGBA1C in the last 72 hours. CBG: No results for input(s): GLUCAP in the last 168 hours. Lipid Profile: No results for input(s): CHOL, HDL, LDLCALC, TRIG, CHOLHDL, LDLDIRECT in the last 72 hours. Thyroid Function Tests: No results for input(s): TSH, T4TOTAL, FREET4, T3FREE, THYROIDAB in the last 72 hours. Anemia Panel: No results for input(s): VITAMINB12, FOLATE, FERRITIN, TIBC, IRON, RETICCTPCT in the last 72 hours. Urine analysis: No results found for: COLORURINE, APPEARANCEUR, LABSPEC, PHURINE, GLUCOSEU, HGBUR, BILIRUBINUR, KETONESUR, PROTEINUR, UROBILINOGEN, NITRITE, LEUKOCYTESUR Sepsis Labs: @LABRCNTIP (procalcitonin:4,lacticidven:4) ) Recent Results (from the past 240 hour(s))  Resp Panel by RT-PCR (Flu A&B, Covid) Nasopharyngeal Swab     Status: None   Collection Time: 05/14/21 11:06 PM   Specimen: Nasopharyngeal Swab; Nasopharyngeal(NP) swabs in vial transport medium  Result Value Ref Range Status   SARS Coronavirus 2 by RT PCR NEGATIVE NEGATIVE Final    Comment: (NOTE) SARS-CoV-2 target nucleic acids are NOT DETECTED.  The SARS-CoV-2 RNA is generally detectable in upper respiratory specimens during the acute phase of infection. The lowest concentration of SARS-CoV-2 viral copies this assay can detect is 138 copies/mL. A negative result does not preclude SARS-Cov-2 infection and should not be used as the sole basis for treatment or other patient management decisions. A negative result may occur with  improper specimen collection/handling, submission of specimen other than nasopharyngeal swab, presence of viral mutation(s) within the areas targeted by this assay, and inadequate number of  viral copies(<138 copies/mL). A negative result must be combined with clinical observations, patient history, and epidemiological information. The expected result is  Negative.  Fact Sheet for Patients:  EntrepreneurPulse.com.au  Fact Sheet for Healthcare Providers:  IncredibleEmployment.be  This test is no t yet approved or cleared by the [place] FDA and  has been authorized for detection and/or diagnosis of SARS-CoV-2 by FDA under an Emergency Use Authorization (EUA). This EUA will remain  in effect (meaning this test can be used) for the duration of the COVID-19 declaration under Section 564(b)(1) of the Act, 21 U.S.C.section 360bbb-3(b)(1), unless the authorization is terminated  or revoked sooner.       Influenza A by PCR NEGATIVE NEGATIVE Final   Influenza B by PCR NEGATIVE NEGATIVE Final    Comment: (NOTE) The Xpert Xpress SARS-CoV-2/FLU/RSV plus assay is intended as an aid in the diagnosis of influenza from Nasopharyngeal swab specimens and should not be used as a sole basis for treatment. Nasal washings and aspirates are unacceptable for Xpert Xpress SARS-CoV-2/FLU/RSV testing.  Fact Sheet for Patients: EntrepreneurPulse.com.au  Fact Sheet for Healthcare Providers: IncredibleEmployment.be  This test is not yet approved or cleared by the [place] FDA and has been authorized for detection and/or diagnosis of SARS-CoV-2 by FDA under an Emergency Use Authorization (EUA). This EUA will remain in effect (meaning this test can be used) for the duration of the COVID-19 declaration under Section 564(b)(1) of the Act, 21 U.S.C. section 360bbb-3(b)(1), unless the authorization is terminated or revoked.  Performed at [facility], [address] [place], [place] [address]   Culture, blood (routine x 2)     Status: None   Collection Time: 05/15/21 12:07 AM   Specimen: BLOOD  Result  Value Ref Range Status   Specimen Description BLOOD RIGHT FOREARM  Final   Special Requests   Final    BOTTLES DRAWN AEROBIC AND ANAEROBIC Blood Culture results may not be optimal due to an excessive volume of blood received in culture bottles   Culture   Final    NO GROWTH 5 DAYS Performed at [facility], [address] [place], [place] [address]    Report Status 05/20/2021 FINAL  Final  Culture, blood (routine x 2)     Status: None   Collection Time: 05/15/21 12:07 AM   Specimen: BLOOD  Result Value Ref Range Status   Specimen Description BLOOD LEFT FOREARM  Final   Special Requests   Final    BOTTLES DRAWN AEROBIC AND ANAEROBIC Blood Culture results may not be optimal due to an excessive volume of blood received in culture bottles   Culture   Final    NO GROWTH 5 DAYS Performed at [facility], [address], [place], [place] [address]    Report Status 05/20/2021 FINAL  Final  MRSA Next Gen by PCR, Nasal     Status: None   Collection Time: 05/15/21 11:36 PM   Specimen: Nasal Mucosa; Nasal Swab  Result Value Ref Range Status   MRSA by PCR Next Gen NOT DETECTED NOT DETECTED Final    Comment: (NOTE) The GeneXpert MRSA Assay (FDA approved for NASAL specimens only), is one component of a comprehensive MRSA colonization surveillance program. It is not intended to diagnose MRSA infection nor to guide or monitor treatment for MRSA infections. Test performance is not FDA approved in patients less than 45 years old. Performed at [facility], [address] [place], [place] [address]       Radiology Studies: [place] CORE BIOPSY (LYMPH NODES)  Result Date: 05/20/2021 INDICATION: Possible lung carcinoma by prior imaging with  vague liver lesions and also enlarged left supraclavicular lymph node by CT. Due to oxygen requirement and poor respiratory status, biopsy of the left supraclavicular lymph node is performed without sedation as tolerance a liver biopsy  currently with sedation would be borderline and potentially high risk. EXAM: ULTRASOUND GUIDED CORE BIOPSY OF LEFT SUPRACLAVICULAR LYMPH NODE MEDICATIONS: None. ANESTHESIA/SEDATION: None PROCEDURE: The procedure, risks, benefits, and alternatives were explained to the patient. Questions regarding the procedure were encouraged and answered. The patient understands and consents to the procedure. A time-out was performed prior to initiating the procedure. The left neck was prepped with chlorhexidine in a sterile fashion, and a sterile drape was applied covering the operative field. A sterile gown and sterile gloves were used for the procedure. Local anesthesia was provided with 1% Lidocaine. After localizing an enlarged left supraclavicular lymph node, an 18 gauge core biopsy device was utilized in obtaining 4 separate core biopsy samples. Material was submitted in formalin. Additional ultrasound was performed. COMPLICATIONS: None immediate. FINDINGS: Several left supraclavicular and lower cervical lymph nodes are identified. The largest measures approximately 1.7 x 1.3 x 1.4 cm by ultrasound. Solid core biopsy samples were obtained from this lymph node. IMPRESSION: Ultrasound-guided core biopsy performed enlarged left supraclavicular lymph node. Electronically Signed   By: Aletta Edouard M.D.   On: 05/20/2021 11:02      Scheduled Meds:  budesonide (PULMICORT) nebulizer solution  0.5 mg Nebulization BID   feeding supplement  237 mL Oral BID BM   guaiFENesin  600 mg Oral BID   ipratropium-albuterol  3 mL Nebulization Q6H   multivitamin with minerals  1 tablet Oral Daily   polyethylene glycol  17 g Oral Daily   predniSONE  20 mg Oral Q breakfast   Continuous Infusions:    LOS: 6 days   Time spent.  30 minutes.  More than 50% of that time was spent in direct patient care and coordination of care.  Lorella Nimrod, MD [facility] Pager: [email or web address] 05/21/2021, 2:09 PM

## 2021-05-21 NOTE — Discharge Summary (Addendum)
Physician Discharge Summary  DELOYD HANDY EUM:[id number] DOB: [birth year] DOA: 05/14/2021  PCP: Pcp, No  Admit date: 05/14/2021 Discharge date: 05/22/2021  Admitted From: Home Disposition: Home  Recommendations for Outpatient Follow-up:  Follow up with PCP in 1-2 weeks Follow-up with oncology within a week. Please obtain BMP/CBC in one week Please follow up on the following pending results: Left supraclavicular lymph node biopsy results  Home Health: Yes Equipment/Devices: Home oxygen Discharge Condition: Stable CODE STATUS: Full Diet recommendation: Heart Healthy / Carb Modified   Brief/Interim Summary: Derek Clarke is a 65 year old male who is a smoker and has no PCP.  The patient states that he has had a cough and shortness of breath for 2-3 months and noticed that he was coughing up blood 3 to 4 days ago.  He is not able to lay flat due to shortness of breath and also states that he is short of breath while sitting up. CTA chest: Bulky abnormal soft tissue in the left hilum encasing the main left pulmonary artery and severely narrowing of the proximal lobar branches. 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 Bilateral adrenal nodules measuring 2 x 1.3 cm.  The right nodule is concerning for metastatic disease.  The left nodule may be an adenoma.  Small pericardial and small pleural effusions.  Consolidation in the lingula may be postobstructive.    He admits to smoking 1 pack/day but states that he stopped smoking about a month ago.  He has been taking aspirin for chest pain up to 2 or 3 a day and last took an aspirin yesterday.  He is admitted for further work-up.  Pulmonology was consulted and Dr. Mortimer Fries saw him and really thinks that he has an infection and wants to treat him with antibiotics and possible bronchoscopy if needed as an outpatient.  Procalcitonin remain negative and imaging is very concerning for advanced malignancy.   Oncology was also consulted and they recommended MRI brain which was without any acute abnormality and MRI liver which shows multiple liver and adrenal lesions concerning for metastatic disease.  Multiple lymph node involvement.  Initial plan to do a liver biopsy and later found to have an abnormal easily accessible left supraclavicular lymph node which was biopsied by interventional radiology on 05/19/2021-pending pathology results. Patient completed the course of antibiotics.  Also treated with steroids which were tapered and stopped before discharge.  Patient needs to follow-up very closely with oncology, Dr. Grayland Ormond saw him while he was in the hospital and will need a follow-up with him within a week to discuss the biopsy results and further plan for his most likely metastatic disease.  Patient continues to require up to 4 L of oxygen and with subjective complaint of shortness of breath.  Oxygen requirement remained stable.  A lot of anxiety is also playing a role.  He was discharged on home oxygen.  Counseled against smoking.  Patient was also given 2 different types of inhaler and also need a outpatient pulmonology follow-up for further management.  Discharge Diagnoses:  Principal Problem:   Hemoptysis Active Problems:   Mass of left lung on CT   Acute respiratory failure with hypoxia (HCC)   Nicotine dependence   Discharge Instructions  Discharge Instructions     Diet - low sodium heart healthy   Complete by: As directed    Discharge instructions   Complete by: As directed    It was pleasure taking care of you. You are being  given an 2 different kinds of inhaler, Pulmicort you will use it daily and albuterol only if you feel short of breath. Please follow-up very closely with Dr. Grayland Ormond from oncology to discuss your biopsy results and further plan. Continue using your oxygen as directed.   Increase activity slowly   Complete by: As directed       Allergies as of 05/22/2021    No Known Allergies      Medication List     STOP taking these medications    aspirin 325 MG tablet       TAKE these medications    albuterol 108 (90 Base) MCG/ACT inhaler Commonly known as: VENTOLIN HFA Inhale 2 puffs into the lungs every 6 (six) hours as needed for wheezing or shortness of breath.   ALPRAZolam 0.5 MG tablet Commonly known as: XANAX Take 1 tablet (0.5 mg total) by mouth 2 (two) times daily as needed for anxiety.   budesonide 180 MCG/ACT inhaler Commonly known as: PULMICORT Inhale 1 puff into the lungs daily.   feeding supplement Liqd Take 237 mLs by mouth 2 (two) times daily between meals.   guaiFENesin 600 MG 12 hr tablet Commonly known as: MUCINEX Take 600 mg by mouth 2 (two) times daily as needed for cough or to loosen phlegm.   ibuprofen 400 MG tablet Commonly known as: ADVIL Take 400 mg by mouth every 6 (six) hours as needed.   multivitamin with minerals Tabs tablet Take 1 tablet by mouth daily.               Durable Medical Equipment  (From admission, onward)           Start     Ordered   05/20/21 1340  For home use only DME oxygen  Once       Question Answer Comment  Length of Need Lifetime   Mode or (Route) Nasal cannula   Liters per Minute 4   Frequency Continuous (stationary and portable oxygen unit needed)   Oxygen conserving device Yes   Oxygen delivery system Gas      05/20/21 1339            Follow-up Information     Lloyd Huger, MD. Schedule an appointment as soon as possible for a visit in 1 week(s).   Specialty: Oncology Contact information: [place] [address] [phone]                No Known Allergies  Consultations: Pulmonology Oncology Interventional radiology  Procedures/Studies: DG Chest 1 View  Result Date: 05/14/2021 CLINICAL DATA:  Shortness of breath for couple of months. EXAM: CHEST  1 VIEW COMPARISON:  None. FINDINGS: Mild cardiac  enlargement. Perihilar infiltrates bilaterally may represent edema or multifocal pneumonia. No pleural effusions. No pneumothorax. Mediastinal contours appear intact. IMPRESSION: Bilateral perihilar infiltrates. Electronically Signed   By: Lucienne Capers M.D.   On: 05/14/2021 23:11   CT Angio Chest PE W/Cm &/Or Wo Cm  Result Date: 05/15/2021 CLINICAL DATA:  Hemoptysis EXAM: CT ANGIOGRAPHY CHEST WITH CONTRAST TECHNIQUE: Multidetector CT imaging of the chest was performed using the standard protocol during bolus administration of intravenous contrast. Multiplanar CT image reconstructions and MIPs were obtained to evaluate the vascular anatomy. CONTRAST:  117mL OMNIPAQUE IOHEXOL 350 MG/ML SOLN COMPARISON:  None. FINDINGS: Cardiovascular: Contrast injection is sufficient to demonstrate satisfactory opacification of the pulmonary arteries to the segmental level. There is no pulmonary embolus or evidence of right heart strain.  The size of the main pulmonary artery is normal. Proximal left pulmonary arteries are severely narrowed by confluent soft tissue surrounding the left hilum. The course and caliber of the aorta are normal. There is atherosclerotic calcification. Opacification decreased due to pulmonary arterial phase contrast bolus timing. There is a small pericardial effusion. Mediastinum/Nodes: There is bulky abnormal mediastinal soft tissue with in case mint of the left common carotid artery and proximal left subclavian artery. There are multiple enlarged mediastinal lymph nodes. Level 4 are node measures 10 mm. Lungs/Pleura: There is confluent soft tissue at the left hilum encasing the main left pulmonary artery and severely narrowing the proximal lobar branches. Abnormal soft tissue extends in a peribronchial pattern. There is also abnormal soft tissue at the right hilum. There is atelectasis within the right middle lobe and an area of consolidation in the lingula. Small pleural effusions. Upper Abdomen:  Contrast bolus timing is not optimized for evaluation of the abdominal organs. There is an intermediate density 2.0 x 1.3 cm right adrenal nodule. There is a lower density left adrenal nodule measuring 1.5 cm. Musculoskeletal: No chest wall abnormality. No bony spinal canal stenosis. Review of the MIP images confirms the above findings. IMPRESSION: 1. No pulmonary embolus or acute aortic syndrome. 2. Bulky abnormal soft tissue at the left hilum encasing the main left pulmonary artery and severely narrowing the proximal lobar branches, consistent with malignancy. 3. Multiple enlarged mediastinal lymph nodes, consistent with lymphatic metastatic disease. 4. Bilateral adrenal nodules, measuring up to 2.0 x 1.3 cm. The right adrenal nodule is concerning for metastatic disease. The left may be an adenoma. 5. Small pericardial effusion and small pleural effusions. 6. Consolidation in the lingula may be postobstructive. Aortic atherosclerosis (ICD10-I70.0). Electronically Signed   By: Ulyses Jarred M.D.   On: 05/15/2021 01:57   MR BRAIN W WO CONTRAST  Result Date: 05/18/2021 CLINICAL DATA:  Lung cancer diagnosis.  Staging. EXAM: MRI HEAD WITHOUT AND WITH CONTRAST TECHNIQUE: Multiplanar, multiecho pulse sequences of the brain and surrounding structures were obtained without and with intravenous contrast. CONTRAST:  18mL GADAVIST GADOBUTROL 1 MMOL/ML IV SOLN COMPARISON:  None. FINDINGS: Brain: Study suffers from motion degradation. Diffusion imaging does not show any acute or subacute infarction or other cause of restricted diffusion. No evidence of chronic small vessel disease or old cortical infarction. Very few punctate foci of T2 and FLAIR signal within the white matter. No sign of mass lesion, hemorrhage, hydrocephalus or extra-axial collection. No abnormal contrast enhancement occurs. Vascular: Major vessels at the base of the brain show flow. Skull and upper cervical spine: Negative Sinuses/Orbits: Clear/normal  Other: None IMPRESSION: No evidence of regional metastatic disease. Sensitivity is slightly limited by motion degradation and utilization of fast scanning protocol. I do not see any suspicious findings. Electronically Signed   By: Nelson Chimes M.D.   On: 05/18/2021 13:44   CT ABDOMEN PELVIS W CONTRAST  Result Date: 05/15/2021 CLINICAL DATA:  Abnormal left hilar soft tissue noted on chest CTA. This was concerning for malignancy. Additional evaluation to assess for possible metastatic disease. EXAM: CT ABDOMEN AND PELVIS WITH CONTRAST TECHNIQUE: Multidetector CT imaging of the abdomen and pelvis was performed using the standard protocol following bolus administration of intravenous contrast. CONTRAST:  81mL OMNIPAQUE IOHEXOL 350 MG/ML SOLN COMPARISON:  Current chest CTA. FINDINGS: Lower chest: Small right effusion. Left lung base subcentimeter nodules. Small patchy areas of ground-glass opacity at the left lung base. Pericardial effusion. No change from the earlier chest CTA.  Hepatobiliary: Subtle liver lesions suspicious for metastatic disease. Largest lies in the right lobe, segment 6, 1.4 cm in size. Liver normal in size and overall attenuation. Mildly distended gallbladder. Increased attenuation material in the dependent gallbladder possibly small stones or sludge. No wall thickening or inflammation. No bile duct dilation. Pancreas: Unremarkable. No pancreatic ductal dilatation or surrounding inflammatory changes. Spleen: Normal in size without focal abnormality. Adrenals/Urinary Tract: Bilateral adrenal masses, right 2.1 x 1.4 cm, left 1.1 cm size. Symmetric renal enhancement and excretion. No renal masses, stones or hydronephrosis. Normal ureters. Bladder minimally distended. Wall appears thickened. No bladder mass. Stomach/Bowel: Normal stomach. Small bowel and colon are normal in caliber. No wall thickening. No inflammation. Normal appendix suggested. No evidence of appendicitis. Vascular/Lymphatic: Mild  aortic atherosclerosis.  No aneurysm. Prominent lymph nodes above the celiac axis, largest 1.2 cm in short axis. Prominent but subcentimeter retroperitoneal lymph nodes. No other enlarged nodes. Reproductive: Significant prostate enlargement elevating the bladder base. Prostate measures 6.9 cm superior to inferior by 5.8 x 5.6 cm transversely. Other: No abdominal wall hernia or abnormality. No abdominopelvic ascites. Musculoskeletal: Small area of sclerosis in the right ilium, likely a bone island. More definitive bone island noted in the anterior left femoral head. Vague sclerotic lesion along the lower endplate of T9. No osteolytic lesions. No fracture or acute finding. IMPRESSION: 1. Subtle low-density liver lesions consistent with metastatic disease. 2. Bilateral adrenal masses suspicious for metastatic disease. 3. Prominent lymph nodes above the celiac axis suspicious for metastatic disease. 4. Subtle areas of sclerosis, right ilium and lower endplate of T9, nonspecific, possibly metastatic disease. 5. No acute findings. Electronically Signed   By: Lajean Manes M.D.   On: 05/15/2021 17:56   MR LIVER W WO CONTRAST  Result Date: 05/18/2021 CLINICAL DATA:  Inpatient. Acute respiratory failure with infiltrative left upper lobe soft tissue and bilateral mediastinal adenopathy suspicious on chest CT for lung malignancy. Indeterminate liver and bilateral adrenal lesions on CT abdomen study. EXAM: MRI ABDOMEN WITHOUT AND WITH CONTRAST TECHNIQUE: Multiplanar multisequence MR imaging of the abdomen was performed both before and after the administration of intravenous contrast. CONTRAST:  29mL GADAVIST GADOBUTROL 1 MMOL/ML IV SOLN COMPARISON:  05/15/2021 CT abdomen/pelvis and chest CT angiogram. FINDINGS: Substantially motion degraded exam, limiting assessment. Lower chest: Small right and trace left dependent pleural effusions. Geographic consolidation in the visualized lingula and right middle lobe. Hepatobiliary:  Normal liver size and configuration. No hepatic steatosis. There are several (at least 5) mildly T2 hyperintense T1 hypointense hypoenhancing liver masses scattered throughout the liver with restricted diffusion, compatible with metastatic disease, largest 1.5 x 1.3 cm in the segment 6 right liver (series 4/image 20), 1.3 x 1.1 cm in the anterior segment 4 left liver (series 4/image 18) and 1.1 x 1.1 cm in the segment 8 right liver (series 4/image 9). Normal gallbladder with no cholelithiasis. No biliary ductal dilatation. Common bile duct diameter 4 mm. No choledocholithiasis. No biliary masses, strictures or beading. Pancreas: No pancreatic mass or duct dilation.  No pancreas divisum. Spleen: Normal size. No mass. Adrenals/Urinary Tract: Enhancing right adrenal 2.5 x 1.7 cm nodule (series 3/image 18) demonstrates no significant loss of signal intensity on chemical shift imaging and is indeterminate. Similar enhancing left adrenal 1.5 x 1.3 cm nodule (series 3/image 23), too small to characterize for intralesional lipid on the motion degraded chemical shift images. No hydronephrosis. Scattered bilateral simple renal cysts, largest 1.6 cm in the anterior upper left kidney. 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 No overtly suspicious renal masses. Stomach/Bowel: Normal non-distended stomach. Visualized small and large bowel is normal caliber, with no bowel wall thickening. Vascular/Lymphatic: Normal caliber abdominal aorta. Patent portal, splenic, hepatic and renal veins. A few mildly enlarged left para-aortic nodes, largest 1.2 cm short axis diameter (series 18/image 66). Enlarged 1.5 cm high left retroperitoneal node anterior to the left diaphragmatic crus (series 18/image 37). Other: No abdominal  ascites or focal fluid collection. Musculoskeletal: Enhancing inferior T9 vertebral 2.8 cm and right T10 vertebral 1.1 cm bone lesions (series 17/image 19). Enhancing 2.1 cm medial right iliac bone lesion (series 17/image 19). IMPRESSION: 1. Limited motion degraded scan. 2. Several (at least 5) hypoenhancing liver masses scattered throughout the liver, with MRI features compatible with metastatic disease. Percutaneous biopsy of the 1.5 cm segment 6 right liver or 1.3 cm anterior segment 4 left liver lesions could be considered. 3. Indeterminate enhancing bilateral adrenal nodules, measuring 2.5 cm on the right and 1.5 cm on the left. Metastatic disease is not excluded. 4. Left para-aortic and high left retroperitoneal lymphadenopathy suspicious for metastatic disease. 5. Enhancing T9 and T10 vertebral and medial right iliac bone lesions, suspicious for bone metastases. 6. Small right and trace left dependent pleural effusions. Geographic consolidation in the visualized lingula and right middle lobe, nonspecific, please see recent chest CT angiogram study for details. Electronically Signed   By: Ilona Sorrel M.D.   On: 05/18/2021 16:39   [place] CORE BIOPSY (LYMPH NODES)  Result Date: 05/20/2021 INDICATION: Possible lung carcinoma by prior imaging with vague liver lesions and also enlarged left supraclavicular lymph node by CT. Due to oxygen requirement and poor respiratory status, biopsy of the left supraclavicular lymph node is performed without sedation as tolerance a liver biopsy currently with sedation would be borderline and potentially high risk. EXAM: ULTRASOUND GUIDED CORE BIOPSY OF LEFT SUPRACLAVICULAR LYMPH NODE MEDICATIONS: None. ANESTHESIA/SEDATION: None PROCEDURE: The procedure, risks, benefits, and alternatives were explained to the patient. Questions regarding the procedure were encouraged and answered. The patient understands and consents to the procedure. A time-out was performed prior to initiating  the procedure. The left neck was prepped with chlorhexidine in a sterile fashion, and a sterile drape was applied covering the operative field. A sterile gown and sterile gloves were used for the procedure. Local anesthesia was provided with 1% Lidocaine. After localizing an enlarged left supraclavicular lymph node, an 18 gauge core biopsy device was utilized in obtaining 4 separate core biopsy samples. Material was submitted in formalin. Additional ultrasound was performed. COMPLICATIONS: None immediate. FINDINGS: Several left supraclavicular and lower cervical lymph nodes are identified. The largest measures approximately 1.7 x 1.3 x 1.4 cm by ultrasound. Solid core biopsy samples were obtained from this lymph node. IMPRESSION: Ultrasound-guided core biopsy performed enlarged left supraclavicular lymph node. Electronically Signed   By: Aletta Edouard M.D.   On: 05/20/2021 11:02    Subjective: Patient was seen and examined today.  No new complaints.  Respiratory status seems stable.  He was waiting for his brother to visit him today and hoping to go with him home.  Discharge Exam: Vitals:   05/22/21 0753 05/22/21 1218  BP: 121/77 128/73  Pulse: (!) 102 (!) 105  Resp: 20 19  Temp: 98.6 F (37 C) 97.9 F (36.6 C)  SpO2: 96% 96%  Vitals:   05/22/21 0453 05/22/21 0721 05/22/21 0753 05/22/21 1218  BP: 119/69  121/77 128/73  Pulse: (!) 107  (!) 102 (!) 105  Resp: 19  20 19   Temp: 98.4 F (36.9 C)  98.6 F (37 C) 97.9 F (36.6 C)  TempSrc:    Oral  SpO2: 93% 93% 96% 96%  Weight:      Height:        General: Pt is alert, awake, not in acute distress Cardiovascular: RRR, S1/S2 +, no rubs, no gallops Respiratory: CTA bilaterally, no wheezing, no rhonchi, mildly decreased breath sounds on left Abdominal: Soft, NT, ND, bowel sounds + Extremities: no edema, no cyanosis   The results of significant diagnostics from this hospitalization (including imaging, microbiology, ancillary and  laboratory) are listed below for reference.    Microbiology: Recent Results (from the past 240 hour(s))  Resp Panel by RT-PCR (Flu A&B, Covid) Nasopharyngeal Swab     Status: None   Collection Time: 05/14/21 11:06 PM   Specimen: Nasopharyngeal Swab; Nasopharyngeal(NP) swabs in vial transport medium  Result Value Ref Range Status   SARS Coronavirus 2 by RT PCR NEGATIVE NEGATIVE Final    Comment: (NOTE) SARS-CoV-2 target nucleic acids are NOT DETECTED.  The SARS-CoV-2 RNA is generally detectable in upper respiratory specimens during the acute phase of infection. The lowest concentration of SARS-CoV-2 viral copies this assay can detect is 138 copies/mL. A negative result does not preclude SARS-Cov-2 infection and should not be used as the sole basis for treatment or other patient management decisions. A negative result may occur with  improper specimen collection/handling, submission of specimen other than nasopharyngeal swab, presence of viral mutation(s) within the areas targeted by this assay, and inadequate number of viral copies(<138 copies/mL). A negative result must be combined with clinical observations, patient history, and epidemiological information. The expected result is Negative.  Fact Sheet for Patients:  EntrepreneurPulse.com.au  Fact Sheet for Healthcare Providers:  IncredibleEmployment.be  This test is no t yet approved or cleared by the [place] FDA and  has been authorized for detection and/or diagnosis of SARS-CoV-2 by FDA under an Emergency Use Authorization (EUA). This EUA will remain  in effect (meaning this test can be used) for the duration of the COVID-19 declaration under Section 564(b)(1) of the Act, 21 U.S.C.section 360bbb-3(b)(1), unless the authorization is terminated  or revoked sooner.       Influenza A by PCR NEGATIVE NEGATIVE Final   Influenza B by PCR NEGATIVE NEGATIVE Final    Comment: (NOTE) The  Xpert Xpress SARS-CoV-2/FLU/RSV plus assay is intended as an aid in the diagnosis of influenza from Nasopharyngeal swab specimens and should not be used as a sole basis for treatment. Nasal washings and aspirates are unacceptable for Xpert Xpress SARS-CoV-2/FLU/RSV testing.  Fact Sheet for Patients: EntrepreneurPulse.com.au  Fact Sheet for Healthcare Providers: IncredibleEmployment.be  This test is not yet approved or cleared by the [place] FDA and has been authorized for detection and/or diagnosis of SARS-CoV-2 by FDA under an Emergency Use Authorization (EUA). This EUA will remain in effect (meaning this test can be used) for the duration of the COVID-19 declaration under Section 564(b)(1) of the Act, 21 U.S.C. section 360bbb-3(b)(1), unless the authorization is terminated or revoked.  Performed at [facility], [address] [place], [place] [address]   Culture, blood (routine x 2)     Status: None   Collection Time: 05/15/21 12:07 AM   Specimen: BLOOD  Result Value Ref  Range Status   Specimen Description BLOOD RIGHT FOREARM  Final   Special Requests   Final    BOTTLES DRAWN AEROBIC AND ANAEROBIC Blood Culture results may not be optimal due to an excessive volume of blood received in culture bottles   Culture   Final    NO GROWTH 5 DAYS Performed at [facility], [address] [place], [place] [address]    Report Status 05/20/2021 FINAL  Final  Culture, blood (routine x 2)     Status: None   Collection Time: 05/15/21 12:07 AM   Specimen: BLOOD  Result Value Ref Range Status   Specimen Description BLOOD LEFT FOREARM  Final   Special Requests   Final    BOTTLES DRAWN AEROBIC AND ANAEROBIC Blood Culture results may not be optimal due to an excessive volume of blood received in culture bottles   Culture   Final    NO GROWTH 5 DAYS Performed at [facility], [address] [place], [place]  [address]    Report Status 05/20/2021 FINAL  Final  MRSA Next Gen by PCR, Nasal     Status: None   Collection Time: 05/15/21 11:36 PM   Specimen: Nasal Mucosa; Nasal Swab  Result Value Ref Range Status   MRSA by PCR Next Gen NOT DETECTED NOT DETECTED Final    Comment: (NOTE) The GeneXpert MRSA Assay (FDA approved for NASAL specimens only), is one component of a comprehensive MRSA colonization surveillance program. It is not intended to diagnose MRSA infection nor to guide or monitor treatment for MRSA infections. Test performance is not FDA approved in patients less than 26 years old. Performed at [facility], [address] [place], [place] [address]      Labs: BNP (last 3 results) No results for input(s): BNP in the last 8760 hours. Basic Metabolic Panel: Recent Labs  Lab 05/17/21 0308 05/22/21 0439  NA 137 138  K 4.0 3.4*  CL 98 93*  CO2 27 34*  GLUCOSE 153* 119*  BUN 18 20  CREATININE 0.60* 0.67  CALCIUM 9.3 9.4   Liver Function Tests: No results for input(s): AST, ALT, ALKPHOS, BILITOT, PROT, ALBUMIN in the last 168 hours.  No results for input(s): LIPASE, AMYLASE in the last 168 hours. No results for input(s): AMMONIA in the last 168 hours. CBC: Recent Labs  Lab 05/17/21 0308 05/19/21 0815 05/22/21 0439  WBC 15.0* 17.5* 16.4*  HGB 14.8 15.3 15.6  HCT 44.8 46.9 47.9  MCV 85.7 88.3 88.9  PLT 209 207 222   Cardiac Enzymes: No results for input(s): CKTOTAL, CKMB, CKMBINDEX, TROPONINI in the last 168 hours. BNP: Invalid input(s): POCBNP CBG: No results for input(s): GLUCAP in the last 168 hours. D-Dimer No results for input(s): DDIMER in the last 72 hours. Hgb A1c No results for input(s): HGBA1C in the last 72 hours. Lipid Profile No results for input(s): CHOL, HDL, LDLCALC, TRIG, CHOLHDL, LDLDIRECT in the last 72 hours. Thyroid function studies No results for input(s): TSH, T4TOTAL, T3FREE, THYROIDAB in the last 72 hours.  Invalid  input(s): FREET3 Anemia work up No results for input(s): VITAMINB12, FOLATE, FERRITIN, TIBC, IRON, RETICCTPCT in the last 72 hours. Urinalysis No results found for: COLORURINE, APPEARANCEUR, [place], [place], GLUCOSEU, [place], [place], [place], PROTEINUR, UROBILINOGEN, NITRITE, LEUKOCYTESUR Sepsis Labs Invalid input(s): PROCALCITONIN,  WBC,  LACTICIDVEN Microbiology Recent Results (from the past 240 hour(s))  Resp Panel by RT-PCR (Flu A&B, Covid) Nasopharyngeal Swab     Status: None  Collection Time: 05/14/21 11:06 PM   Specimen: Nasopharyngeal Swab; Nasopharyngeal(NP) swabs in vial transport medium  Result Value Ref Range Status   SARS Coronavirus 2 by RT PCR NEGATIVE NEGATIVE Final    Comment: (NOTE) SARS-CoV-2 target nucleic acids are NOT DETECTED.  The SARS-CoV-2 RNA is generally detectable in upper respiratory specimens during the acute phase of infection. The lowest concentration of SARS-CoV-2 viral copies this assay can detect is 138 copies/mL. A negative result does not preclude SARS-Cov-2 infection and should not be used as the sole basis for treatment or other patient management decisions. A negative result may occur with  improper specimen collection/handling, submission of specimen other than nasopharyngeal swab, presence of viral mutation(s) within the areas targeted by this assay, and inadequate number of viral copies(<138 copies/mL). A negative result must be combined with clinical observations, patient history, and epidemiological information. The expected result is Negative.  Fact Sheet for Patients:  EntrepreneurPulse.com.au  Fact Sheet for Healthcare Providers:  IncredibleEmployment.be  This test is no t yet approved or cleared by the [place] FDA and  has been authorized for detection and/or diagnosis of SARS-CoV-2 by FDA under an Emergency Use Authorization (EUA). This EUA will remain  in effect (meaning this  test can be used) for the duration of the COVID-19 declaration under Section 564(b)(1) of the Act, 21 U.S.C.section 360bbb-3(b)(1), unless the authorization is terminated  or revoked sooner.       Influenza A by PCR NEGATIVE NEGATIVE Final   Influenza B by PCR NEGATIVE NEGATIVE Final    Comment: (NOTE) The Xpert Xpress SARS-CoV-2/FLU/RSV plus assay is intended as an aid in the diagnosis of influenza from Nasopharyngeal swab specimens and should not be used as a sole basis for treatment. Nasal washings and aspirates are unacceptable for Xpert Xpress SARS-CoV-2/FLU/RSV testing.  Fact Sheet for Patients: EntrepreneurPulse.com.au  Fact Sheet for Healthcare Providers: IncredibleEmployment.be  This test is not yet approved or cleared by the [place] FDA and has been authorized for detection and/or diagnosis of SARS-CoV-2 by FDA under an Emergency Use Authorization (EUA). This EUA will remain in effect (meaning this test can be used) for the duration of the COVID-19 declaration under Section 564(b)(1) of the Act, 21 U.S.C. section 360bbb-3(b)(1), unless the authorization is terminated or revoked.  Performed at [facility], [address] [place], [place] [address]   Culture, blood (routine x 2)     Status: None   Collection Time: 05/15/21 12:07 AM   Specimen: BLOOD  Result Value Ref Range Status   Specimen Description BLOOD RIGHT FOREARM  Final   Special Requests   Final    BOTTLES DRAWN AEROBIC AND ANAEROBIC Blood Culture results may not be optimal due to an excessive volume of blood received in culture bottles   Culture   Final    NO GROWTH 5 DAYS Performed at [facility], [address] [place], [place] [address]    Report Status 05/20/2021 FINAL  Final  Culture, blood (routine x 2)     Status: None   Collection Time: 05/15/21 12:07 AM   Specimen: BLOOD  Result Value Ref Range Status   Specimen Description  BLOOD LEFT FOREARM  Final   Special Requests   Final    BOTTLES DRAWN AEROBIC AND ANAEROBIC Blood Culture results may not be optimal due to an excessive volume of blood received in culture bottles   Culture   Final    NO GROWTH 5 DAYS Performed at [facility], [address], [place], [place] [address]    Report Status 05/20/2021 FINAL  Final  MRSA Next Gen by PCR, Nasal     Status: None   Collection Time: 05/15/21 11:36 PM   Specimen: Nasal Mucosa; Nasal Swab  Result Value Ref Range Status   MRSA by PCR Next Gen NOT DETECTED NOT DETECTED Final    Comment: (NOTE) The GeneXpert MRSA Assay (FDA approved for NASAL specimens only), is one component of a comprehensive MRSA colonization surveillance program. It is not intended to diagnose MRSA infection nor to guide or monitor treatment for MRSA infections. Test performance is not FDA approved in patients less than 66 years old. Performed at [facility], [address] [place], [place] [address]     Time coordinating discharge: Over 30 minutes  SIGNED:  Lorella Nimrod, MD  [facility] 05/22/2021, 1:28 PM  If 7PM-7AM, please contact night-coverage [email or web address]  This record has been created using Systems analyst. Errors have been sought and corrected,but may not always be located. Such creation errors do not reflect on the standard of care.

## 2021-05-22 LAB — CBC
HCT: 47.9 % (ref 39.0–52.0)
Hemoglobin: 15.6 g/dL (ref 13.0–17.0)
MCH: 28.9 pg (ref 26.0–34.0)
MCHC: 32.6 g/dL (ref 30.0–36.0)
MCV: 88.9 fL (ref 80.0–100.0)
Platelets: 222 10*3/uL (ref 150–400)
RBC: 5.39 MIL/uL (ref 4.22–5.81)
RDW: 12.8 % (ref 11.5–15.5)
WBC: 16.4 10*3/uL — ABNORMAL HIGH (ref 4.0–10.5)
nRBC: 0 % (ref 0.0–0.2)

## 2021-05-22 LAB — BASIC METABOLIC PANEL
Anion gap: 11 (ref 5–15)
BUN: 20 mg/dL (ref 8–23)
CO2: 34 mmol/L — ABNORMAL HIGH (ref 22–32)
Calcium: 9.4 mg/dL (ref 8.9–10.3)
Chloride: 93 mmol/L — ABNORMAL LOW (ref 98–111)
Creatinine, Ser: 0.67 mg/dL (ref 0.61–1.24)
GFR, Estimated: >60 mL/min (ref >60)
Glucose, Bld: 119 mg/dL — ABNORMAL HIGH (ref 70–99)
Potassium: 3.4 mmol/L — ABNORMAL LOW (ref 3.5–5.1)
Sodium: 138 mmol/L (ref 135–145)

## 2021-05-22 MED ORDER — ALPRAZOLAM 0.5 MG PO TABS
0.5000 mg | ORAL_TABLET | Freq: Two times a day (BID) | ORAL | Status: DC | PRN
  Administered 2021-05-22: 0.5 mg via ORAL
  Filled 2021-05-22: qty 1

## 2021-05-22 MED ORDER — ALPRAZOLAM 0.5 MG PO TABS: 0.5000 mg | ORAL_TABLET | Freq: Two times a day (BID) | ORAL | 0 refills | Status: DC | PRN

## 2021-05-22 MED ORDER — COVID-19 MRNA VAC-TRIS(PFIZER) 30 MCG/0.3ML IM SUSP
0.3000 mL | Freq: Once | INTRAMUSCULAR | Status: AC
  Administered 2021-05-22: 0.3 mL via INTRAMUSCULAR
  Filled 2021-05-22: qty 0.3

## 2021-05-22 MED ORDER — COVID-19 MRNA VAC-TRIS(PFIZER) 30 MCG/0.3ML IM SUSP
0.3000 mL | Freq: Once | INTRAMUSCULAR | Status: DC
  Filled 2021-05-22: qty 0.3

## 2021-05-22 MED ORDER — COVID-19 MRNA VACC (MODERNA) 50 MCG/0.25ML IM SUSP
0.2500 mL | Freq: Once | INTRAMUSCULAR | Status: DC
  Filled 2021-05-22: qty 0.25

## 2021-05-22 NOTE — Progress Notes (Signed)
PROGRESS NOTE    Derek Clarke   ZJQ:[id number]  DOB: [birth year]  DOA: 05/14/2021 PCP: Merryl Hacker, No   Brief Narrative:  Derek Clarke is a 65 year old male who is a smoker and has no PCP.  The patient states that he has had a cough and shortness of breath for 2-3 months and noticed that he was coughing up blood 3 to 4 days ago.  He is not able to lay flat due to shortness of breath and also states that he is short of breath while sitting up. CTA chest: Bulky abnormal soft tissue in the left hilum encasing the main left pulmonary artery and severely narrowing of the proximal lobar branches. 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 Bilateral adrenal nodules measuring 2 x 1.3 cm.  The right nodule is concerning for metastatic disease.  The left nodule may be an adenoma.  Small pericardial and small pleural effusions.  Consolidation in the lingula may be postobstructive.   He admits to smoking 1 pack/day but states that he stopped smoking about a month ago.  He has been taking aspirin for chest pain up to 2 or 3 a day and last took an aspirin yesterday.  He is admitted for further work-up  8/22: Pulmonary not planning any bronchoscopy while here and recommends to continue antibiotic treatment.  Oncology was also consulted for concern of metastatic disease, patient had his MRI brain and liver yesterday.  8/25: MRI brain without any acute abnormality.  MRI liver with multiple liver and adrenal lesions, concern of metastatic disease, multiple lymph node involvement.  Discussed with IR and oncology, initial plan was to obtain a liver biopsy, later found to have an abnormal left supraclavicular lymph node with Easy Access which was biopsied by IR pending pathology results.  Palliative care was also consulted but patient wants to stay as full code and full scope of medical care.  He was discharged yesterday once his brother visited him, oxygen was  delivered in the room.  Later on did not left as well as becoming very anxious.  Patient remained very anxious with a lot of fear of unknown.  Stating that my throat is clogging at times.  No swallowing difficulties.  Had another discussion this morning and decided to try some Xanax and then go home and follow-up with oncology as an outpatient.  Subjective: Patient remained very anxious and continued to have this feeling of impending doom.  Had another long discussion that he has to use his oxygen and follow-up very closely with oncology for further recommendations and work-up.  We also discussed that he can see an ENT as an outpatient if needed.  He has quite a bit of lymph node involvement around internal carotid and subclavian arteries which can cause some hoarseness and bulk effect in neck.  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   - Pulmonary recommends to continue antibiotics for treatment of pneumonia and outpatient CT in 4 to 6 weeks, per Dr.Kasa's note he does not think that it is malignancy and they want to treated as infection first.  Procalcitonin negative, imaging is very concerning for metastatic disease. Completed a 5-day course of antibiotics. -Decrease the dose of steroid so we can stop in next couple of days. -CT abdomen pelvis shows bilateral adrenal masses, right ilium and T9 mets  and prominent lymph nodes in the celiac axis worrisome for metastatic disease -Oncology was consulted.  MRI brain was without any acute abnormality but MRI liver with concern of metastatic disease. -Left supraclavicular abnormal lymph node was biopsied by IR -pending pathology -Will need PET scan as an outpatient.    Active Problems:    Acute respiratory failure with hypoxia  -His pulse ox was 88% on room air at rest -  Continue oxygen to keep pulse ox in the 90s.  He is requiring 4 L oxygen for now and remained stable on that oxygen for many days now. -Wean as tolerated -Anxiety might be playing some role. -Will need home oxygen  Hypokalemia.  Resolved. Replete and recheck  Anxiety.  Patient has some underlying psych issues. -We will try Xanax -Advised to follow-up with his PCP and psychiatrist as an outpatient    Nicotine dependence -He admits that he was smoking up to 1 pack/day for many years and states he stopped about 1 month ago  DVT prophylaxis: SCDs Start: 05/15/21 0308  Code Status: full  Family Communication: Brother at bedside Level of Care: Level of care: Med-Surg Disposition Plan: Stable for discharge Status is: Inpatient  Remains inpatient appropriate because:Inpatient level of care appropriate due to severity of illness  Dispo: The patient is from: Home              Anticipated d/c is to: Home              Patient currently is medically stable-     Difficult to place patient Yes    Consultants:  Pulmonary Oncology Procedures:  Lymph node biopsy Antimicrobials:  Anti-infectives (From admission, onward)    Start     Dose/Rate Route Frequency Ordered Stop   05/17/21 1000  azithromycin (ZITHROMAX) tablet 250 mg  Status:  Discontinued       See Hyperspace for full Linked Orders Report.   250 mg Oral Daily 05/16/21 1339 05/19/21 1232   05/16/21 1500  cefTRIAXone (ROCEPHIN) 1 g in sodium chloride 0.9 % 100 mL IVPB        1 g 200 mL/hr over 30 Minutes Intravenous Every 24 hours 05/16/21 1339 05/20/21 0350   05/16/21 1430  azithromycin (ZITHROMAX) tablet 500 mg       See Hyperspace for full Linked Orders Report.   500 mg Oral Daily 05/16/21 1339 05/16/21 1542   05/15/21 1300  cefTRIAXone (ROCEPHIN) 1 g in sodium chloride 0.9 % 100 mL IVPB        1 g 200 mL/hr over 30 Minutes Intravenous  Once 05/15/21 1256 05/15/21 1457   05/15/21 0315  cefTRIAXone (ROCEPHIN) 2 g in sodium  chloride 0.9 % 100 mL IVPB  Status:  Discontinued        2 g 200 mL/hr over 30 Minutes Intravenous Every 24 hours 05/15/21 0309 05/15/21 0314   05/15/21 0315  azithromycin (ZITHROMAX) 500 mg in sodium chloride 0.9 % 250 mL IVPB  Status:  Discontinued        500 mg 250 mL/hr over 60 Minutes Intravenous Every 24 hours 05/15/21 0309 05/15/21 0314   05/14/21 2330  cefTRIAXone (ROCEPHIN) 1 g in sodium chloride 0.9 % 100 mL IVPB        1 g 200 mL/hr over 30 Minutes Intravenous  Once 05/14/21 2329 05/15/21 0033   05/14/21 2330  azithromycin (ZITHROMAX) 500 mg in sodium chloride 0.9 % 250 mL IVPB        500 mg  250 mL/hr over 60 Minutes Intravenous  Once 05/14/21 2329 05/15/21 0153        Objective: Vitals:   05/22/21 0453 05/22/21 0721 05/22/21 0753 05/22/21 1218  BP: 119/69  121/77 128/73  Pulse: (!) 107  (!) 102 (!) 105  Resp: 19  20 19   Temp: 98.4 F (36.9 C)  98.6 F (37 C) 97.9 F (36.6 C)  TempSrc:    Oral  SpO2: 93% 93% 96% 96%  Weight:      Height:        Intake/Output Summary (Last 24 hours) at 05/22/2021 1336 Last data filed at 05/22/2021 1047 Gross per 24 hour  Intake 360 ml  Output --  Net 360 ml    Filed Weights   05/14/21 2243 05/16/21 1639  Weight: 102.1 kg 106.1 kg    Examination: General.  Very anxious gentleman, in no acute distress. Pulmonary.  Lungs clear bilaterally, normal respiratory effort.  Mildly decreased breath sounds on left. CV.  Regular rate and rhythm, no JVD, rub or murmur. Abdomen.  Soft, nontender, nondistended, BS positive. CNS.  Alert and oriented .  No focal neurologic deficit. Extremities.  No edema, no cyanosis, pulses intact and symmetrical. Psychiatry.  Judgment and insight appears normal.   Data Reviewed: I have personally reviewed following labs and imaging studies  CBC: Recent Labs  Lab 05/17/21 0308 05/19/21 0815 05/22/21 0439  WBC 15.0* 17.5* 16.4*  HGB 14.8 15.3 15.6  HCT 44.8 46.9 47.9  MCV 85.7 88.3 88.9  PLT  209 207 878    Basic Metabolic Panel: Recent Labs  Lab 05/17/21 0308 05/22/21 0439  NA 137 138  K 4.0 3.4*  CL 98 93*  CO2 27 34*  GLUCOSE 153* 119*  BUN 18 20  CREATININE 0.60* 0.67  CALCIUM 9.3 9.4    GFR: Estimated Creatinine Clearance: 112 mL/min (by C-G formula based on SCr of 0.67 mg/dL). Liver Function Tests: No results for input(s): AST, ALT, ALKPHOS, BILITOT, PROT, ALBUMIN in the last 168 hours.  No results for input(s): LIPASE, AMYLASE in the last 168 hours. No results for input(s): AMMONIA in the last 168 hours. Coagulation Profile: Recent Labs  Lab 05/19/21 0815  INR 1.0    Cardiac Enzymes: No results for input(s): CKTOTAL, CKMB, CKMBINDEX, TROPONINI in the last 168 hours. BNP (last 3 results) No results for input(s): PROBNP in the last 8760 hours. HbA1C: No results for input(s): HGBA1C in the last 72 hours. CBG: No results for input(s): GLUCAP in the last 168 hours. Lipid Profile: No results for input(s): CHOL, HDL, LDLCALC, TRIG, CHOLHDL, LDLDIRECT in the last 72 hours. Thyroid Function Tests: No results for input(s): TSH, T4TOTAL, FREET4, T3FREE, THYROIDAB in the last 72 hours. Anemia Panel: No results for input(s): VITAMINB12, FOLATE, FERRITIN, TIBC, IRON, RETICCTPCT in the last 72 hours. Urine analysis: No results found for: COLORURINE, APPEARANCEUR, LABSPEC, PHURINE, GLUCOSEU, HGBUR, BILIRUBINUR, KETONESUR, PROTEINUR, UROBILINOGEN, NITRITE, LEUKOCYTESUR Sepsis Labs: @LABRCNTIP (procalcitonin:4,lacticidven:4) ) Recent Results (from the past 240 hour(s))  Resp Panel by RT-PCR (Flu A&B, Covid) Nasopharyngeal Swab     Status: None   Collection Time: 05/14/21 11:06 PM   Specimen: Nasopharyngeal Swab; Nasopharyngeal(NP) swabs in vial transport medium  Result Value Ref Range Status   SARS Coronavirus 2 by RT PCR NEGATIVE NEGATIVE Final    Comment: (NOTE) SARS-CoV-2 target nucleic acids are NOT DETECTED.  The SARS-CoV-2 RNA is generally detectable  in upper respiratory specimens during the acute phase of infection. The lowest concentration of SARS-CoV-2  viral copies this assay can detect is 138 copies/mL. A negative result does not preclude SARS-Cov-2 infection and should not be used as the sole basis for treatment or other patient management decisions. A negative result may occur with  improper specimen collection/handling, submission of specimen other than nasopharyngeal swab, presence of viral mutation(s) within the areas targeted by this assay, and inadequate number of viral copies(<138 copies/mL). A negative result must be combined with clinical observations, patient history, and epidemiological information. The expected result is Negative.  Fact Sheet for Patients:  EntrepreneurPulse.com.au  Fact Sheet for Healthcare Providers:  IncredibleEmployment.be  This test is no t yet approved or cleared by the [place] FDA and  has been authorized for detection and/or diagnosis of SARS-CoV-2 by FDA under an Emergency Use Authorization (EUA). This EUA will remain  in effect (meaning this test can be used) for the duration of the COVID-19 declaration under Section 564(b)(1) of the Act, 21 U.S.C.section 360bbb-3(b)(1), unless the authorization is terminated  or revoked sooner.       Influenza A by PCR NEGATIVE NEGATIVE Final   Influenza B by PCR NEGATIVE NEGATIVE Final    Comment: (NOTE) The Xpert Xpress SARS-CoV-2/FLU/RSV plus assay is intended as an aid in the diagnosis of influenza from Nasopharyngeal swab specimens and should not be used as a sole basis for treatment. Nasal washings and aspirates are unacceptable for Xpert Xpress SARS-CoV-2/FLU/RSV testing.  Fact Sheet for Patients: EntrepreneurPulse.com.au  Fact Sheet for Healthcare Providers: IncredibleEmployment.be  This test is not yet approved or cleared by the [place] FDA and has  been authorized for detection and/or diagnosis of SARS-CoV-2 by FDA under an Emergency Use Authorization (EUA). This EUA will remain in effect (meaning this test can be used) for the duration of the COVID-19 declaration under Section 564(b)(1) of the Act, 21 U.S.C. section 360bbb-3(b)(1), unless the authorization is terminated or revoked.  Performed at [facility], [address] [place], [place] [address]   Culture, blood (routine x 2)     Status: None   Collection Time: 05/15/21 12:07 AM   Specimen: BLOOD  Result Value Ref Range Status   Specimen Description BLOOD RIGHT FOREARM  Final   Special Requests   Final    BOTTLES DRAWN AEROBIC AND ANAEROBIC Blood Culture results may not be optimal due to an excessive volume of blood received in culture bottles   Culture   Final    NO GROWTH 5 DAYS Performed at [facility], [address] [place], [place] [address]    Report Status 05/20/2021 FINAL  Final  Culture, blood (routine x 2)     Status: None   Collection Time: 05/15/21 12:07 AM   Specimen: BLOOD  Result Value Ref Range Status   Specimen Description BLOOD LEFT FOREARM  Final   Special Requests   Final    BOTTLES DRAWN AEROBIC AND ANAEROBIC Blood Culture results may not be optimal due to an excessive volume of blood received in culture bottles   Culture   Final    NO GROWTH 5 DAYS Performed at [facility], [address] [place], [place] [address]    Report Status 05/20/2021 FINAL  Final  MRSA Next Gen by PCR, Nasal     Status: None   Collection Time: 05/15/21 11:36 PM   Specimen: Nasal Mucosa; Nasal Swab  Result Value Ref Range Status   MRSA by PCR Next Gen NOT DETECTED NOT DETECTED Final    Comment: (NOTE) The  GeneXpert MRSA Assay (FDA approved for NASAL specimens only), is one component of a comprehensive MRSA colonization surveillance program. It is not intended to diagnose MRSA infection nor to guide or monitor treatment for MRSA  infections. Test performance is not FDA approved in patients less than 12 years old. Performed at [facility], [address] [place], [place] [address]       Radiology Studies: No results found.    Scheduled Meds:  budesonide (PULMICORT) nebulizer solution  0.5 mg Nebulization BID   COVID-19 mRNA vaccine (Moderna)  0.25 mL Intramuscular Once   feeding supplement  237 mL Oral BID BM   guaiFENesin  600 mg Oral BID   ipratropium-albuterol  3 mL Nebulization Q6H   multivitamin with minerals  1 tablet Oral Daily   polyethylene glycol  17 g Oral Daily   predniSONE  20 mg Oral Q breakfast   Continuous Infusions:    LOS: 7 days   Time spent.  30 minutes.  More than 50% of that time was spent in direct patient care and coordination of care.  Lorella Nimrod, MD [facility] Pager: [email or web address] 05/22/2021, 1:36 PM

## 2021-05-22 NOTE — Progress Notes (Signed)
Pt discharged home on 02 with brother in stable condition. Discharge instructions given. Scripts sent to pharmacy of choice. No immediate questions or concerns. Discharged from unit via wheelchair.

## 2021-05-23 ENCOUNTER — Encounter: Payer: Self-pay | Admitting: *Deleted

## 2021-05-23 DIAGNOSIS — R918 Other nonspecific abnormal finding of lung field: Secondary | ICD-10-CM

## 2021-05-23 NOTE — Progress Notes (Signed)
Per Dr. Grayland Ormond, pt discharged from the hospital and will need outpatient PET scan to follow up large lung mass. Orders placed. Pt has hospital follow up appt scheduled with Dr. Grayland Ormond on 05/26/21. Will meet with patient at appt to introduce to navigator services and review upcoming appt for PET scan.

## 2021-05-24 ENCOUNTER — Other Ambulatory Visit: Payer: Self-pay | Admitting: Anatomic Pathology & Clinical Pathology

## 2021-05-24 LAB — SURGICAL PATHOLOGY

## 2021-05-26 ENCOUNTER — Inpatient Hospital Stay: Payer: Self-pay | Admitting: Oncology

## 2021-05-26 ENCOUNTER — Inpatient Hospital Stay: Payer: Non-veteran care

## 2021-05-26 ENCOUNTER — Encounter: Payer: Self-pay | Admitting: Oncology

## 2021-05-26 ENCOUNTER — Inpatient Hospital Stay: Payer: Self-pay | Attending: Oncology | Admitting: Oncology

## 2021-05-26 ENCOUNTER — Other Ambulatory Visit: Payer: Self-pay

## 2021-05-26 ENCOUNTER — Encounter: Payer: Self-pay | Admitting: *Deleted

## 2021-05-26 VITALS — BP 115/99 | HR 105 | Temp 100.7°F | Resp 24 | Ht 69.0 in | Wt 230.0 lb

## 2021-05-26 DIAGNOSIS — C787 Secondary malignant neoplasm of liver and intrahepatic bile duct: Secondary | ICD-10-CM | POA: Insufficient documentation

## 2021-05-26 DIAGNOSIS — Z5111 Encounter for antineoplastic chemotherapy: Secondary | ICD-10-CM | POA: Insufficient documentation

## 2021-05-26 DIAGNOSIS — C3492 Malignant neoplasm of unspecified part of left bronchus or lung: Secondary | ICD-10-CM | POA: Insufficient documentation

## 2021-05-26 DIAGNOSIS — C770 Secondary and unspecified malignant neoplasm of lymph nodes of head, face and neck: Secondary | ICD-10-CM | POA: Insufficient documentation

## 2021-05-26 DIAGNOSIS — Z9981 Dependence on supplemental oxygen: Secondary | ICD-10-CM | POA: Insufficient documentation

## 2021-05-26 DIAGNOSIS — Z5112 Encounter for antineoplastic immunotherapy: Secondary | ICD-10-CM | POA: Insufficient documentation

## 2021-05-26 DIAGNOSIS — C3490 Malignant neoplasm of unspecified part of unspecified bronchus or lung: Secondary | ICD-10-CM | POA: Insufficient documentation

## 2021-05-26 DIAGNOSIS — Z79899 Other long term (current) drug therapy: Secondary | ICD-10-CM | POA: Insufficient documentation

## 2021-05-26 NOTE — Progress Notes (Signed)
New patient in for hospital follow up. Reports was discharged on Monday or Tuesday of this week.. Pt very weak, skin color pallor and short of breath at rest.  Pt wearing 4 liters oxygen via nasal cannula.

## 2021-05-26 NOTE — Progress Notes (Signed)
START ON PATHWAY REGIMEN - Non-Small Cell Lung     A cycle is every 21 days:     Paclitaxel      Carboplatin      Bevacizumab-xxxx   **Always confirm dose/schedule in your pharmacy ordering system**  Patient Characteristics: Stage IV Metastatic, Nonsquamous, Awaiting Molecular Test Results and Need to Start Chemotherapy, PS = 0, 1 Therapeutic Status: Stage IV Metastatic Histology: Nonsquamous Cell Broad Molecular Profiling Status: Awaiting Molecular Test Results and Need to Start Chemotherapy ECOG Performance Status: 1 Intent of Therapy: Non-Curative / Palliative Intent, Discussed with Patient

## 2021-05-27 ENCOUNTER — Encounter: Payer: Self-pay | Admitting: Oncology

## 2021-05-27 MED ORDER — LIDOCAINE-PRILOCAINE 2.5-2.5 % EX CREA: TOPICAL_CREAM | CUTANEOUS | 3 refills | Status: DC

## 2021-05-27 MED ORDER — PROCHLORPERAZINE MALEATE 10 MG PO TABS: 10.0000 mg | ORAL_TABLET | Freq: Four times a day (QID) | ORAL | 2 refills | Status: DC | PRN

## 2021-05-27 MED ORDER — ONDANSETRON HCL 8 MG PO TABS: 8.0000 mg | ORAL_TABLET | Freq: Two times a day (BID) | ORAL | 2 refills | Status: DC | PRN

## 2021-05-27 NOTE — Progress Notes (Signed)
Met with patient and his brother during follow up appt with Dr. Grayland Ormond to discuss biopsy results and treatment options. All questions answered during visit. Reviewed upcoming appts. Informed that will be called with appt for port placement. Resources given regarding diagnosis and supportive services available. Contact info given and instructed to call with any questions or needs. Pt and his brother verbalized understanding.

## 2021-05-27 NOTE — Progress Notes (Signed)
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  INTERVAL HISTORY: Patient returns to clinic today for hospital follow-up, discussion of his final pathology results, and treatment planning.  He continues to be significantly short of breath with an oxygen requirement of 4 L as well as mildly somnolent likely secondary to hypercapnia.  He is anxious, but otherwise feels well.  He has no neurologic complaints.  He denies any recent fevers.  He has a fair appetite, but denies weight loss.  He has no chest pain or hemoptysis.  He continues to have chronic cough and secretions.  He has no nausea, vomiting, constipation, or diarrhea.  He has no urinary complaints.  Patient feels generally terrible, but offers no further specific complaints today.  REVIEW OF SYSTEMS:   Review of Systems  Constitutional:  Positive for malaise/fatigue. Negative for fever and weight loss.  Respiratory:  Positive for cough, sputum production and shortness of breath. Negative for hemoptysis.   Cardiovascular: Negative.  Negative for chest pain and leg swelling.  Gastrointestinal: Negative.  Negative for abdominal pain.  Genitourinary: Negative.  Negative for dysuria and hematuria.  Musculoskeletal: Negative.  Negative for back pain.  Skin: Negative.  Negative for rash.  Neurological:  Positive for weakness. Negative for dizziness, focal weakness and headaches.  Psychiatric/Behavioral:  The patient is nervous/anxious.    As per HPI. Otherwise, a complete review of systems is negative.  PAST MEDICAL HISTORY: History reviewed. No pertinent past medical history.  PAST SURGICAL HISTORY: History reviewed. No pertinent surgical history.  FAMILY  HISTORY: History reviewed. No pertinent family history.  ADVANCED DIRECTIVES (Y/N):  N  HEALTH MAINTENANCE: Social History   Tobacco Use   Smoking status: Every Day    Types: Cigarettes   Smokeless tobacco: Never  Substance Use Topics   Alcohol use: Never     Colonoscopy:  PAP:  Bone density:  Lipid panel:  No Known Allergies  Current Outpatient Medications  Medication Sig Dispense Refill   albuterol (VENTOLIN HFA) 108 (90 Base) MCG/ACT inhaler Inhale 2 puffs into the lungs every 6 (six) hours as needed for wheezing or shortness of breath. 8 g 2   ALPRAZolam (XANAX) 0.5 MG tablet Take 1 tablet (0.5 mg total) by mouth 2 (two) times daily as needed for anxiety. 20 tablet 0   budesonide (PULMICORT) 180 MCG/ACT inhaler Inhale 1 puff into the lungs daily. 1 each 2   feeding supplement (ENSURE ENLIVE / ENSURE PLUS) LIQD Take 237 mLs by mouth 2 (two) times daily between meals. 237 mL 12   guaiFENesin (MUCINEX) 600 MG 12 hr tablet Take 600 mg by mouth 2 (two) times daily as needed for cough or to loosen phlegm.     ibuprofen (ADVIL) 400 MG tablet Take 400 mg by mouth every 6 (six) hours as needed.     Multiple Vitamin (MULTIVITAMIN WITH MINERALS) TABS tablet Take 1 tablet by mouth daily. 90 tablet 1   lidocaine-prilocaine (EMLA) cream Apply to affected area once 30 g 3   ondansetron (ZOFRAN) 8 MG tablet Take 1 tablet (8 mg total) by mouth 2 (two) times daily as needed for refractory nausea / vomiting. 60 tablet 2   prochlorperazine (COMPAZINE) 10 MG tablet Take 1 tablet (10 mg total) by  mouth every 6 (six) hours as needed (Nausea or vomiting). 60 tablet 2   No current facility-administered medications for this visit.    OBJECTIVE: Vitals:   05/26/21 1426  BP: (!) 115/99  Pulse: (!) 105  Resp: (!) 24  Temp: (!) 100.7 F (38.2 C)  SpO2: 95%     Body mass index is 33.97 kg/m.    ECOG FS:2 - Symptomatic, <50% confined to bed  General: Well-developed, well-nourished, mild  respiratory distress. Eyes: Pink conjunctiva, anicteric sclera. HEENT: Normocephalic, moist mucous membranes. Lungs: Diminished breath sounds. Heart: Regular rate and rhythm. Abdomen: Soft, nontender, no obvious distention. Musculoskeletal: No edema, cyanosis, or clubbing. Neuro: Alert, answering all questions appropriately. Cranial nerves grossly intact. Skin: No rashes or petechiae noted. Psych: Normal affect. Lymphatics: No cervical, calvicular, axillary or inguinal LAD.   LAB RESULTS:  Lab Results  Component Value Date   NA 138 05/22/2021   K 3.4 (L) 05/22/2021   CL 93 (L) 05/22/2021   CO2 34 (H) 05/22/2021   GLUCOSE 119 (H) 05/22/2021   BUN 20 05/22/2021   CREATININE 0.67 05/22/2021   CALCIUM 9.4 05/22/2021   PROT 6.7 05/14/2021   ALBUMIN 3.7 05/14/2021   AST 30 05/14/2021   ALT 33 05/14/2021   ALKPHOS 100 05/14/2021   BILITOT 1.1 05/14/2021   GFRNONAA >60 05/22/2021    Lab Results  Component Value Date   WBC 16.4 (H) 05/22/2021   NEUTROABS 8.4 (H) 05/14/2021   HGB 15.6 05/22/2021   HCT 47.9 05/22/2021   MCV 88.9 05/22/2021   PLT 222 05/22/2021     STUDIES: DG Chest 1 View  Result Date: 05/14/2021 CLINICAL DATA:  Shortness of breath for couple of months. EXAM: CHEST  1 VIEW COMPARISON:  None. FINDINGS: Mild cardiac enlargement. Perihilar infiltrates bilaterally may represent edema or multifocal pneumonia. No pleural effusions. No pneumothorax. Mediastinal contours appear intact. IMPRESSION: Bilateral perihilar infiltrates. Electronically Signed   By: Lucienne Capers M.D.   On: 05/14/2021 23:11   CT Angio Chest PE W/Cm &/Or Wo Cm  Result Date: 05/15/2021 CLINICAL DATA:  Hemoptysis EXAM: CT ANGIOGRAPHY CHEST WITH CONTRAST TECHNIQUE: Multidetector CT imaging of the chest was performed using the standard protocol during bolus administration of intravenous contrast. Multiplanar CT image reconstructions and MIPs were obtained to evaluate the vascular anatomy.  CONTRAST:  155mL OMNIPAQUE IOHEXOL 350 MG/ML SOLN COMPARISON:  None. FINDINGS: Cardiovascular: Contrast injection is sufficient to demonstrate satisfactory opacification of the pulmonary arteries to the segmental level. There is no pulmonary embolus or evidence of right heart strain. The size of the main pulmonary artery is normal. Proximal left pulmonary arteries are severely narrowed by confluent soft tissue surrounding the left hilum. The course and caliber of the aorta are normal. There is atherosclerotic calcification. Opacification decreased due to pulmonary arterial phase contrast bolus timing. There is a small pericardial effusion. Mediastinum/Nodes: There is bulky abnormal mediastinal soft tissue with in case mint of the left common carotid artery and proximal left subclavian artery. There are multiple enlarged mediastinal lymph nodes. Level 4 are node measures 10 mm. Lungs/Pleura: There is confluent soft tissue at the left hilum encasing the main left pulmonary artery and severely narrowing the proximal lobar branches. Abnormal soft tissue extends in a peribronchial pattern. There is also abnormal soft tissue at the right hilum. There is atelectasis within the right middle lobe and an area of consolidation in the lingula. Small pleural effusions. Upper Abdomen: Contrast bolus timing is not optimized for evaluation of  the abdominal organs. There is an intermediate density 2.0 x 1.3 cm right adrenal nodule. There is a lower density left adrenal nodule measuring 1.5 cm. Musculoskeletal: No chest wall abnormality. No bony spinal canal stenosis. Review of the MIP images confirms the above findings. IMPRESSION: 1. No pulmonary embolus or acute aortic syndrome. 2. Bulky abnormal soft tissue at the left hilum encasing the main left pulmonary artery and severely narrowing the proximal lobar branches, consistent with malignancy. 3. Multiple enlarged mediastinal lymph nodes, consistent with lymphatic metastatic  disease. 4. Bilateral adrenal nodules, measuring up to 2.0 x 1.3 cm. The right adrenal nodule is concerning for metastatic disease. The left may be an adenoma. 5. Small pericardial effusion and small pleural effusions. 6. Consolidation in the lingula may be postobstructive. Aortic atherosclerosis (ICD10-I70.0). Electronically Signed   By: Ulyses Jarred M.D.   On: 05/15/2021 01:57   MR BRAIN W WO CONTRAST  Result Date: 05/18/2021 CLINICAL DATA:  Lung cancer diagnosis.  Staging. EXAM: MRI HEAD WITHOUT AND WITH CONTRAST TECHNIQUE: Multiplanar, multiecho pulse sequences of the brain and surrounding structures were obtained without and with intravenous contrast. CONTRAST:  32mL GADAVIST GADOBUTROL 1 MMOL/ML IV SOLN COMPARISON:  None. FINDINGS: Brain: Study suffers from motion degradation. Diffusion imaging does not show any acute or subacute infarction or other cause of restricted diffusion. No evidence of chronic small vessel disease or old cortical infarction. Very few punctate foci of T2 and FLAIR signal within the white matter. No sign of mass lesion, hemorrhage, hydrocephalus or extra-axial collection. No abnormal contrast enhancement occurs. Vascular: Major vessels at the base of the brain show flow. Skull and upper cervical spine: Negative Sinuses/Orbits: Clear/normal Other: None IMPRESSION: No evidence of regional metastatic disease. Sensitivity is slightly limited by motion degradation and utilization of fast scanning protocol. I do not see any suspicious findings. Electronically Signed   By: Nelson Chimes M.D.   On: 05/18/2021 13:44   CT ABDOMEN PELVIS W CONTRAST  Result Date: 05/15/2021 CLINICAL DATA:  Abnormal left hilar soft tissue noted on chest CTA. This was concerning for malignancy. Additional evaluation to assess for possible metastatic disease. EXAM: CT ABDOMEN AND PELVIS WITH CONTRAST TECHNIQUE: Multidetector CT imaging of the abdomen and pelvis was performed using the standard protocol  following bolus administration of intravenous contrast. CONTRAST:  85mL OMNIPAQUE IOHEXOL 350 MG/ML SOLN COMPARISON:  Current chest CTA. FINDINGS: Lower chest: Small right effusion. Left lung base subcentimeter nodules. Small patchy areas of ground-glass opacity at the left lung base. Pericardial effusion. No change from the earlier chest CTA. Hepatobiliary: Subtle liver lesions suspicious for metastatic disease. Largest lies in the right lobe, segment 6, 1.4 cm in size. Liver normal in size and overall attenuation. Mildly distended gallbladder. Increased attenuation material in the dependent gallbladder possibly small stones or sludge. No wall thickening or inflammation. No bile duct dilation. Pancreas: Unremarkable. No pancreatic ductal dilatation or surrounding inflammatory changes. Spleen: Normal in size without focal abnormality. Adrenals/Urinary Tract: Bilateral adrenal masses, right 2.1 x 1.4 cm, left 1.1 cm size. Symmetric renal enhancement and excretion. No renal masses, stones or hydronephrosis. Normal ureters. Bladder minimally distended. Wall appears thickened. No bladder mass. Stomach/Bowel: Normal stomach. Small bowel and colon are normal in caliber. No wall thickening. No inflammation. Normal appendix suggested. No evidence of appendicitis. Vascular/Lymphatic: Mild aortic atherosclerosis.  No aneurysm. Prominent lymph nodes above the celiac axis, largest 1.2 cm in short axis. Prominent but subcentimeter retroperitoneal lymph nodes. No other enlarged nodes. Reproductive: Significant prostate enlargement  elevating the bladder base. Prostate measures 6.9 cm superior to inferior by 5.8 x 5.6 cm transversely. Other: No abdominal wall hernia or abnormality. No abdominopelvic ascites. Musculoskeletal: Small area of sclerosis in the right ilium, likely a bone island. More definitive bone island noted in the anterior left femoral head. Vague sclerotic lesion along the lower endplate of T9. No osteolytic  lesions. No fracture or acute finding. IMPRESSION: 1. Subtle low-density liver lesions consistent with metastatic disease. 2. Bilateral adrenal masses suspicious for metastatic disease. 3. Prominent lymph nodes above the celiac axis suspicious for metastatic disease. 4. Subtle areas of sclerosis, right ilium and lower endplate of T9, nonspecific, possibly metastatic disease. 5. No acute findings. Electronically Signed   By: Lajean Manes M.D.   On: 05/15/2021 17:56   MR LIVER W WO CONTRAST  Result Date: 05/18/2021 CLINICAL DATA:  Inpatient. Acute respiratory failure with infiltrative left upper lobe soft tissue and bilateral mediastinal adenopathy suspicious on chest CT for lung malignancy. Indeterminate liver and bilateral adrenal lesions on CT abdomen study. EXAM: MRI ABDOMEN WITHOUT AND WITH CONTRAST TECHNIQUE: Multiplanar multisequence MR imaging of the abdomen was performed both before and after the administration of intravenous contrast. CONTRAST:  58mL GADAVIST GADOBUTROL 1 MMOL/ML IV SOLN COMPARISON:  05/15/2021 CT abdomen/pelvis and chest CT angiogram. FINDINGS: Substantially motion degraded exam, limiting assessment. Lower chest: Small right and trace left dependent pleural effusions. Geographic consolidation in the visualized lingula and right middle lobe. Hepatobiliary: Normal liver size and configuration. No hepatic steatosis. There are several (at least 5) mildly T2 hyperintense T1 hypointense hypoenhancing liver masses scattered throughout the liver with restricted diffusion, compatible with metastatic disease, largest 1.5 x 1.3 cm in the segment 6 right liver (series 4/image 20), 1.3 x 1.1 cm in the anterior segment 4 left liver (series 4/image 18) and 1.1 x 1.1 cm in the segment 8 right liver (series 4/image 9). Normal gallbladder with no cholelithiasis. No biliary ductal dilatation. Common bile duct diameter 4 mm. No choledocholithiasis. No biliary masses, strictures or beading. Pancreas: No  pancreatic mass or duct dilation.  No pancreas divisum. Spleen: Normal size. No mass. Adrenals/Urinary Tract: Enhancing right adrenal 2.5 x 1.7 cm nodule (series 3/image 18) demonstrates no significant loss of signal intensity on chemical shift imaging and is indeterminate. Similar enhancing left adrenal 1.5 x 1.3 cm nodule (series 3/image 23), too small to characterize for intralesional lipid on the motion degraded chemical shift images. No hydronephrosis. Scattered bilateral simple renal cysts, largest 1.6 cm in the anterior upper left kidney. T2 hypointense T1 hyperintense 1.1 cm renal cortical lesion in the lower right kidney (series 4/image 33) without compelling enhancement on the motion degraded postcontrast sequences, compatible with hemorrhagic/proteinaceous Bosniak category 2 renal cyst. No overtly suspicious renal masses. Stomach/Bowel: Normal non-distended stomach. Visualized small and large bowel is normal caliber, with no bowel wall thickening. Vascular/Lymphatic: Normal caliber abdominal aorta. Patent portal, splenic, hepatic and renal veins. A few mildly enlarged left para-aortic nodes, largest 1.2 cm short axis diameter (series 18/image 66). Enlarged 1.5 cm high left retroperitoneal node anterior to the left diaphragmatic crus (series 18/image 37). Other: No abdominal ascites or focal fluid collection. Musculoskeletal: Enhancing inferior T9 vertebral 2.8 cm and right T10 vertebral 1.1 cm bone lesions (series 17/image 19). Enhancing 2.1 cm medial right iliac bone lesion (series 17/image 19). IMPRESSION: 1. Limited motion degraded scan. 2. Several (at least 5) hypoenhancing liver masses scattered throughout the liver, with MRI features compatible with metastatic disease. Percutaneous biopsy of  the 1.5 cm segment 6 right liver or 1.3 cm anterior segment 4 left liver lesions could be considered. 3. Indeterminate enhancing bilateral adrenal nodules, measuring 2.5 cm on the right and 1.5 cm on the left.  Metastatic disease is not excluded. 4. Left para-aortic and high left retroperitoneal lymphadenopathy suspicious for metastatic disease. 5. Enhancing T9 and T10 vertebral and medial right iliac bone lesions, suspicious for bone metastases. 6. Small right and trace left dependent pleural effusions. Geographic consolidation in the visualized lingula and right middle lobe, nonspecific, please see recent chest CT angiogram study for details. Electronically Signed   By: Ilona Sorrel M.D.   On: 05/18/2021 16:39   [place] CORE BIOPSY (LYMPH NODES)  Result Date: 05/20/2021 INDICATION: Possible lung carcinoma by prior imaging with vague liver lesions and also enlarged left supraclavicular lymph node by CT. Due to oxygen requirement and poor respiratory status, biopsy of the left supraclavicular lymph node is performed without sedation as tolerance a liver biopsy currently with sedation would be borderline and potentially high risk. EXAM: ULTRASOUND GUIDED CORE BIOPSY OF LEFT SUPRACLAVICULAR LYMPH NODE MEDICATIONS: None. ANESTHESIA/SEDATION: None PROCEDURE: The procedure, risks, benefits, and alternatives were explained to the patient. Questions regarding the procedure were encouraged and answered. The patient understands and consents to the procedure. A time-out was performed prior to initiating the procedure. The left neck was prepped with chlorhexidine in a sterile fashion, and a sterile drape was applied covering the operative field. A sterile gown and sterile gloves were used for the procedure. Local anesthesia was provided with 1% Lidocaine. After localizing an enlarged left supraclavicular lymph node, an 18 gauge core biopsy device was utilized in obtaining 4 separate core biopsy samples. Material was submitted in formalin. Additional ultrasound was performed. COMPLICATIONS: None immediate. FINDINGS: Several left supraclavicular and lower cervical lymph nodes are identified. The largest measures approximately 1.7 x 1.3  x 1.4 cm by ultrasound. Solid core biopsy samples were obtained from this lymph node. IMPRESSION: Ultrasound-guided core biopsy performed enlarged left supraclavicular lymph node. Electronically Signed   By: Aletta Edouard M.D.   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  MRI of the brain on May 18, 2021 negative for disease.  Patient will require PET scan which is scheduled next week to complete the staging work-up.  There was insufficient tissue for ancillary studies, therefore will send liquid biopsy to assess for any underlying mutations and PD-L1 status.  Pending mutational status, patient will receive carboplatinum, Taxol, and Avastin every 3 weeks for 4-6 cycles with Udenyca support with the cycle.  He will require port placement prior to initiating treatment. Shortness of breath/oxygen requirement: Patient has follow-up with pulmonology in the near future. Leukocytosis: Likely reactive, monitor.  Patient expressed understanding and was in agreement with this plan. He also understands that He can call clinic at any time with any questions, concerns, or complaints.   Cancer Staging Adenocarcinoma of left lung [facility]) Staging form: Lung, AJCC 8th Edition - Clinical stage from 05/27/2021: Stage IVB (cTX, cN3, pM1c) - Signed by Lloyd Huger, MD on 05/27/2021  Lloyd Huger, MD   05/27/2021 11:27 AM

## 2021-05-31 ENCOUNTER — Other Ambulatory Visit: Payer: Self-pay

## 2021-05-31 ENCOUNTER — Encounter: Payer: Self-pay | Admitting: *Deleted

## 2021-06-01 ENCOUNTER — Other Ambulatory Visit: Payer: Self-pay | Admitting: Radiology

## 2021-06-01 ENCOUNTER — Encounter: Payer: Self-pay | Admitting: Oncology

## 2021-06-01 NOTE — Progress Notes (Signed)
Called patient Derek Clarke. [place] at [phone] today 06/01/2021 at 3:25pm and spoke directly with him regarding scheduled procedure of port-a-catheter placement 06/03/2021. Discussed: Arrival time of 10:15-10:30am.  Patient has a 9am Pulmonary appointment scheduled with [facility] and will come to [facility] directly after this appointment. Reviewed medication details, NPO after midnight on Thursday 06/02/2021 for day of procedure 06/03/2021, Need of responsible driver to drive home, No valuables, Comfortable clothing and patient verbalized again the arrival time of 10:15-10:30-directly after [facility] appointment, and verbalized understanding of instructions.  Questions answered. Also called and spoke with Tammy with Respiratory Therapy and requested RT to evaluate morning of procedure, 06/03/2021.

## 2021-06-02 ENCOUNTER — Other Ambulatory Visit: Payer: Self-pay | Admitting: Radiology

## 2021-06-02 ENCOUNTER — Ambulatory Visit: Payer: Self-pay

## 2021-06-02 ENCOUNTER — Other Ambulatory Visit: Payer: Self-pay

## 2021-06-02 NOTE — Progress Notes (Signed)
Tumor Board Documentation  Derek Clarke was presented by Dr Grayland Ormond at our Tumor Board on 06/02/2021, which included representatives from medical oncology, radiation oncology, internal medicine, navigation, pathology, radiology, genetics, research, palliative care, pulmonology, pharmacy.  Derek Clarke currently presents as a new patient, for Derek Clarke, for new positive pathology with history of the following treatments: active survellience, surgical intervention(s).  Additionally, we reviewed previous medical and familial history, history of present illness, and recent lab results along with all available histopathologic and imaging studies. The tumor board considered available treatment options and made the following recommendations: Additional screening, Chemotherapy (PET Scan, Molecular testing on Lymph Node specimen)    The following procedures/referrals were also placed: No orders of the defined types were placed in this encounter.   Clinical Trial Status: not discussed   Staging used: AJCC Stage Group AJCC Staging: T: x N: 3 M: 1c Group: Stage IV B Adenocarcinoma of Left Lung with Liver Mets   National site-specific guidelines NCCN were discussed with respect to the case.  Tumor board is a meeting of clinicians from various specialty areas who evaluate and discuss patients for whom a multidisciplinary approach is being considered. Final determinations in the plan of care are those of the provider(s). The responsibility for follow up of recommendations given during tumor board is that of the provider.   Today's extended care, comprehensive team conference, Derek Clarke was not present for the discussion and was not examined.   Multidisciplinary Tumor Board is a multidisciplinary case peer review process.  Decisions discussed in the Multidisciplinary Tumor Board reflect the opinions of the specialists present at the conference without having examined the patient.  Ultimately, treatment and diagnostic  decisions rest with the primary provider(s) and the patient.

## 2021-06-03 ENCOUNTER — Ambulatory Visit (INDEPENDENT_AMBULATORY_CARE_PROVIDER_SITE_OTHER): Payer: Self-pay | Admitting: Pulmonary Disease

## 2021-06-03 ENCOUNTER — Ambulatory Visit: Payer: Self-pay | Admitting: Radiology

## 2021-06-03 ENCOUNTER — Encounter: Payer: Self-pay | Admitting: *Deleted

## 2021-06-03 ENCOUNTER — Other Ambulatory Visit: Payer: Self-pay

## 2021-06-03 ENCOUNTER — Encounter: Payer: Self-pay | Admitting: Pulmonary Disease

## 2021-06-03 ENCOUNTER — Ambulatory Visit
Admission: RE | Admit: 2021-06-03 | Discharge: 2021-06-03 | Disposition: A | Discharge status: Home / Self Care | Payer: Non-veteran care | Source: Ambulatory Visit | Attending: Oncology | Admitting: Oncology

## 2021-06-03 ENCOUNTER — Encounter: Payer: Self-pay | Admitting: Radiology

## 2021-06-03 VITALS — BP 124/70 | HR 73 | Temp 97.0°F | Ht 69.0 in | Wt 230.0 lb

## 2021-06-03 DIAGNOSIS — I3139 Other pericardial effusion (noninflammatory): Secondary | ICD-10-CM

## 2021-06-03 DIAGNOSIS — C3492 Malignant neoplasm of unspecified part of left bronchus or lung: Secondary | ICD-10-CM

## 2021-06-03 DIAGNOSIS — I313 Pericardial effusion (noninflammatory): Secondary | ICD-10-CM

## 2021-06-03 DIAGNOSIS — C3490 Malignant neoplasm of unspecified part of unspecified bronchus or lung: Secondary | ICD-10-CM | POA: Insufficient documentation

## 2021-06-03 DIAGNOSIS — Z7951 Long term (current) use of inhaled steroids: Secondary | ICD-10-CM | POA: Insufficient documentation

## 2021-06-03 DIAGNOSIS — J9601 Acute respiratory failure with hypoxia: Secondary | ICD-10-CM

## 2021-06-03 HISTORY — DX: Malignant (primary) neoplasm, unspecified: C80.1

## 2021-06-03 HISTORY — DX: Chronic obstructive pulmonary disease, unspecified: J44.9

## 2021-06-03 HISTORY — PX: IR IMAGING GUIDED PORT INSERTION: IMG5740

## 2021-06-03 MED ORDER — MIDAZOLAM HCL 2 MG/2ML IJ SOLN
INTRAMUSCULAR | Status: AC
  Filled 2021-06-03: qty 2

## 2021-06-03 MED ORDER — FENTANYL CITRATE (PF) 100 MCG/2ML IJ SOLN
INTRAMUSCULAR | Status: DC | PRN
  Administered 2021-06-03: 25 ug via INTRAVENOUS

## 2021-06-03 MED ORDER — SODIUM CHLORIDE 0.9 % IV SOLN
INTRAVENOUS | Status: DC
Start: 2021-06-03 — End: 2021-06-04
  Filled 2021-06-03: qty 1000

## 2021-06-03 MED ORDER — FENTANYL CITRATE (PF) 100 MCG/2ML IJ SOLN
INTRAMUSCULAR | Status: AC
  Filled 2021-06-03: qty 2

## 2021-06-03 MED ORDER — HEPARIN SOD (PORK) LOCK FLUSH 100 UNIT/ML IV SOLN
INTRAVENOUS | Status: AC
  Administered 2021-06-03: 500 [IU]
  Filled 2021-06-03: qty 5

## 2021-06-03 MED ORDER — LIDOCAINE-EPINEPHRINE 1 %-1:100000 IJ SOLN
INTRAMUSCULAR | Status: AC
  Administered 2021-06-03: 20 mL
  Filled 2021-06-03: qty 1

## 2021-06-03 NOTE — Progress Notes (Signed)
Pt. Remains SOB with any effort or exertion. Producing copious amts. Of phlegm. Pt. Insisits on going to the [place]: taken via w/c on O2. Port site without complications noted. Pt. Taken to car with O2, then switched over to pt. Own tank. Stable for DC home.

## 2021-06-03 NOTE — H&P (Signed)
Vascular and Interventional Radiology  PRE PROCEDURE H&P  Assessment  Plan:   [name] is a 65 y.o. year old male who will undergo PORT PLACEMENT in Interventional Radiology.  The procedure has been fully reviewed with the patient/patient's authorized representative. The risks, benefits and alternatives have been explained, and the patient/patient's authorized representative has consented to the procedure. -- The patient will accept blood products in an emergent situation. -- The patient does not have a Do Not Resuscitate order in effect.  HPI: [name] is a 65 y.o. year old male with new diagnosis of metastatic lung CA recommended for chemoRx. Port placement requested. Informed consent was obtained, witnessed and placed in the patient's chart.  Allergies: No Known Allergies  Medications:  Current Outpatient Medications on File Prior to Encounter  Medication Sig Dispense Refill   budesonide (PULMICORT) 180 MCG/ACT inhaler Inhale 1 puff into the lungs daily. 1 each 2   feeding supplement (ENSURE ENLIVE / ENSURE PLUS) LIQD Take 237 mLs by mouth 2 (two) times daily between meals. 237 mL 12   ibuprofen (ADVIL) 400 MG tablet Take 400 mg by mouth every 6 (six) hours as needed.     Multiple Vitamin (MULTIVITAMIN WITH MINERALS) TABS tablet Take 1 tablet by mouth daily. 90 tablet 1   albuterol (VENTOLIN HFA) 108 (90 Base) MCG/ACT inhaler Inhale 2 puffs into the lungs every 6 (six) hours as needed for wheezing or shortness of breath. 8 g 2   ALPRAZolam (XANAX) 0.5 MG tablet Take 1 tablet (0.5 mg total) by mouth 2 (two) times daily as needed for anxiety. 20 tablet 0   guaiFENesin (MUCINEX) 600 MG 12 hr tablet Take 600 mg by mouth 2 (two) times daily as needed for cough or to loosen phlegm.     lidocaine-prilocaine (EMLA) cream Apply to affected area once 30 g 3   ondansetron (ZOFRAN) 8 MG tablet Take 1 tablet (8 mg total) by mouth 2 (two) times daily as needed for refractory  nausea / vomiting. 60 tablet 2   prochlorperazine (COMPAZINE) 10 MG tablet Take 1 tablet (10 mg total) by mouth every 6 (six) hours as needed (Nausea or vomiting). 60 tablet 2   No current facility-administered medications on file prior to encounter.    PSH: History reviewed. No pertinent surgical history.  PMH:  Past Medical History:  Diagnosis Date   Adenocarcinoma ([place])    lung;   COPD (chronic obstructive pulmonary disease) ([place])     Brief Physical Examination: Vitals:   06/03/21 1251 06/03/21 1255  BP: 117/82 (!) 85/72  Pulse: 95 98  Resp: (!) 32 (!) 24  Temp:    SpO2: 94% 93%   General: WD, WN male in NAD HEENT: Normocephalic, atraumatic Lungs: Respirations non-labored  ASA Grade: 3 Mallampati Class: 4   Derek Birks, MD Vascular and Interventional Radiology Specialists [facility]   Pager. [place]

## 2021-06-03 NOTE — Patient Instructions (Addendum)
We are going to order a echocardiogram to evaluate the fluid around your heart  You may use the oxygen at 3 L/min this is maintaining a good oxygen level for you  Part of the oxygen issue is the tumor (cancer) narrowing some of the blood vessels going from the heart to the lungs  From our standpoint we will see you here on an as-needed basis as most of your care will be directed by the cancer center.  We will see you as needed and as requested by your oncologist.  We will let you know the results of your heart test when this is available.

## 2021-06-03 NOTE — Progress Notes (Signed)
Subjective:    Patient ID: Derek Clarke, male    DOB: 04-Oct-1955, 65 y.o.   MRN: [id number] Chief Complaint  Patient presents with   pulmonary consult    Recent admission-discharged on 3L cont. C/o sob with exertion,     HPI Patient is a 65 year old recent former smoker who presents as hospital follow-up.  He has recently been diagnosed with advanced adenocarcinoma of the lung.  He was evaluated by Dr. Mortimer Fries while admitted to the hospital.  He presents with his brother today, he gives permission for discussion of his health issues with his brother.  He was admitted to [facility] on 14 May 2021 and discharged on 28 August.  At that time he had noted shortness of breath of 2 to 3 months duration and had started having hemoptysis 2 to 3 days prior to that admission.  He continues with some hemoptysis currently.  Patient has had profound orthopnea but is also short of breath while sitting up.  A CTA chest performed at that time showed extensive tumor involving the left hilum encasing the left pulmonary artery and severely narrowing the proximal lobar branches.  He had multiple enlarged mediastinal lymph nodes consistent with metastatic disease.  There were also bilateral adrenal nodules.  There was also a pericardial effusion noted consolidation on the lingula that may be postobstructive.  Interventional radiology biopsied a left supraclavicular lymph node and this was consistent with adenocarcinoma.  The original purpose for this visit was to follow-up for potential biopsy via bronchoscopy.  However as noted, the diagnosis has been made.  Patient and brother seem to have poor understanding of what is the primary issue.  I have discussed the gravity of the situation with them.  I have reviewed chest CT with them.  My concern is of a pericardial effusion seen on CT which may be malignant and may be aggravating the patient's dyspnea if it has gotten large enough to create a tamponade type  physiology.  Patient's care has been hampered by need [facility], however it appears that he may not have coverage.  Cancer center is in the process to try to sort these issues out to get patient care necessary.   Review of Systems A 10 point review of systems was performed and it is as noted above otherwise negative.  No past medical history on file.  No past surgical history on file.  Patient Active Problem List   Diagnosis Date Noted   Adenocarcinoma of left lung ([place]) 05/26/2021   Hemoptysis 05/15/2021   Mass of left lung on CT 05/15/2021   Acute respiratory failure with hypoxia ([place]) 05/15/2021   Nicotine dependence 05/15/2021   No family history on file.  Social History   Tobacco Use   Smoking status: Former    Packs/day: 1.00    Years: 30.00    Pack years: 30.00    Types: Cigarettes    Quit date: 03/26/2021    Years since quitting: 0.1   Smokeless tobacco: Never  Substance Use Topics   Alcohol use: Never   No Known Allergies Current Meds  Medication Sig   albuterol (VENTOLIN HFA) 108 (90 Base) MCG/ACT inhaler Inhale 2 puffs into the lungs every 6 (six) hours as needed for wheezing or shortness of breath.   ALPRAZolam (XANAX) 0.5 MG tablet Take 1 tablet (0.5 mg total) by mouth 2 (two) times daily as needed for anxiety.   budesonide (PULMICORT) 180 MCG/ACT inhaler Inhale 1 puff into  the lungs daily.   feeding supplement (ENSURE ENLIVE / ENSURE PLUS) LIQD Take 237 mLs by mouth 2 (two) times daily between meals.   guaiFENesin (MUCINEX) 600 MG 12 hr tablet Take 600 mg by mouth 2 (two) times daily as needed for cough or to loosen phlegm.   ibuprofen (ADVIL) 400 MG tablet Take 400 mg by mouth every 6 (six) hours as needed.   lidocaine-prilocaine (EMLA) cream Apply to affected area once   Multiple Vitamin (MULTIVITAMIN WITH MINERALS) TABS tablet Take 1 tablet by mouth daily.   ondansetron (ZOFRAN) 8 MG tablet Take 1 tablet (8 mg total) by mouth 2 (two) times  daily as needed for refractory nausea / vomiting.   prochlorperazine (COMPAZINE) 10 MG tablet Take 1 tablet (10 mg total) by mouth every 6 (six) hours as needed (Nausea or vomiting).   Immunization History  Administered Date(s) Administered   PFIZER Comirnaty([place])Covid-19 Tri-Sucrose Vaccine 05/22/2021   Pneumococcal Polysaccharide-23 05/16/2021       Objective:   Physical Exam BP 124/70 (BP Location: Left Arm, Cuff Size: Normal)   Pulse 73   Temp (!) 97 F (36.1 C) (Temporal)   Ht 5\' 9"  (1.753 m)   Wt 230 lb (104.3 kg)   SpO2 94%   BMI 33.97 kg/m  GENERAL: Acutely on chronically ill-appearing gentleman, sallow appearance.  Moderate conversational dyspnea.  On 3 L/min nasal cannula O2, presents in transport chair. HEAD: Normocephalic, atraumatic.  EYES: Pupils equal, round, reactive to light.  No scleral icterus.  Pale conjunctiva MOUTH: Nose/mouth/throat not examined due to masking requirements for COVID 19. NECK: Supple. No thyromegaly. Trachea midline. No JVD.  No adenopathy. PULMONARY: Good air entry bilaterally.  No adventitious sounds. CARDIOVASCULAR: S1 and S2. Regular rate and rhythm.  Distant tones. ABDOMEN: Protuberant otherwise benign. MUSCULOSKELETAL: No joint deformity, no clubbing, no edema.  NEUROLOGIC: No overt focal deficit. SKIN: Intact,warm,dry. PSYCH: Flat affect, psychomotor retardation.  Representative images of CT performed 15 May 2021, images independently reviewed, reviewed with patient and brother:       Assessment & Plan:     ICD-10-CM   1. Adenocarcinoma of left lung (HCC)  C34.92    Stage IV adenocarcinoma of the lung Extensive Follow with oncology    2. Acute respiratory failure with hypoxia (HCC)  J96.01    Likely due to pulmonary artery involvement from tumor Pericardial effusion and possible lymphangitic spread Continue supplemental O2    3. Pericardial effusion  I31.3 ECHOCARDIOGRAM COMPLETE   Will obtain 2D echo May be  malignant     Orders Placed This Encounter  Procedures   ECHOCARDIOGRAM COMPLETE    Standing Status:   Future    Standing Expiration Date:   12/01/2021    Order Specific Question:   Where should this test be performed    Answer:   [facility]    Order Specific Question:   Perflutren DEFINITY (image enhancing agent) should be administered unless hypersensitivity or allergy exist    Answer:   Administer Perflutren    Order Specific Question:   Reason for exam-Echo    Answer:   Dyspnea  R06.00   I discussed the case with Dr. Alyssa Grove.  From the pulmonary standpoint there is little that can be done at this point except continue to provide the patient with supplemental oxygen.  Diagnosis has already been made so no further diagnostic procedures are contemplated.  I am concerned about the pericardial effusion and whether this may be related to the  patient's adenocarcinoma and will obtain a 2D echo to further evaluate this issue.  Dr. Grayland Ormond will also have the oncology palliative care team contact patient.  We will see the patient on an as needed basis from here on.  Oncology will be driving his care from here on.   Renold Don, MD Advanced Bronchoscopy PCCM [facility]    *This note was dictated using voice recognition software/Dragon.  Despite best efforts to proofread, errors can occur which can change the meaning.  Any change was purely unintentional.

## 2021-06-03 NOTE — Procedures (Signed)
Vascular and Interventional Radiology Procedure Note  Patient: Derek Clarke DOB: 08-10-1956 Medical Record Number: [id number] Note Date/Time: 06/03/21 1:11 PM   Performing Physician: Michaelle Birks, MD Assistant(s): None  Diagnosis: Lung cancer  Procedure: PORT PLACEMENT  Anesthesia: Conscious Sedation Complications: None Estimated Blood Loss: Minimal  Findings:  Successful right-sided port placement, with the tip of the catheter in the proximal right atrium.  Plan: Catheter ready for use.  See detailed procedure note with images in PACS. The patient tolerated the procedure well without incident or complication and was returned to Recovery in stable condition.    Michaelle Birks, MD Vascular and Interventional Radiology Specialists [facility]   Pager. [place]

## 2021-06-03 NOTE — Progress Notes (Signed)
Previously spoke with patient over the phone and his brother requested for him to get a home health referral. Due to patient not having any insurance coverage at this time, there are limited options for home health. VA benefits are not active at this time until pt initiates eligibility process with the [place]. Will give info to him at his next visit. SW referral placed to help with resources for patient until [place] benefits can be established.

## 2021-06-03 NOTE — Progress Notes (Signed)
Patient clinically unchanged from pre procedure status, respiratory status unchanged. Remains on 4 l/min [place] pre and post procedure. Sats have remained 92-95%. Respiratory therapist remained with patient during entire procedure/Port placement. Only Fentanyl 25 mcg IV given for procedure secondarily to resp. Status per DR Mugweru. Report given to Fransico Michael Rn post procedure/specials.

## 2021-06-04 NOTE — Progress Notes (Deleted)
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  INTERVAL HISTORY: Patient returns to clinic today for hospital follow-up, discussion of his final pathology results, and treatment planning.  He continues to be significantly short of breath with an oxygen requirement of 4 L as well as mildly somnolent likely secondary to hypercapnia.  He is anxious, but otherwise feels well.  He has no neurologic complaints.  He denies any recent fevers.  He has a fair appetite, but denies weight loss.  He has no chest pain or hemoptysis.  He continues to have chronic cough and secretions.  He has no nausea, vomiting, constipation, or diarrhea.  He has no urinary complaints.  Patient feels generally terrible, but offers no further specific complaints today.  REVIEW OF SYSTEMS:   Review of Systems  Constitutional:  Positive for malaise/fatigue. Negative for fever and weight loss.  Respiratory:  Positive for cough, sputum production and shortness of breath. Negative for hemoptysis.   Cardiovascular: Negative.  Negative for chest pain and leg swelling.  Gastrointestinal: Negative.  Negative for abdominal pain.  Genitourinary: Negative.  Negative for dysuria and hematuria.  Musculoskeletal: Negative.  Negative for back pain.  Skin: Negative.  Negative for rash.  Neurological:  Positive for weakness. Negative for dizziness, focal weakness and headaches.  Psychiatric/Behavioral:  The patient is nervous/anxious.    As per HPI. Otherwise, a complete review of systems is negative.  PAST MEDICAL HISTORY: Past Medical History:  Diagnosis Date   Adenocarcinoma ([place])    lung;   COPD (chronic obstructive pulmonary disease) ([place])     PAST SURGICAL  HISTORY: Past Surgical History:  Procedure Laterality Date   IR IMAGING GUIDED PORT INSERTION  06/03/2021    FAMILY HISTORY: No family history on file.  ADVANCED DIRECTIVES (Y/N):  N  HEALTH MAINTENANCE: Social History   Tobacco Use   Smoking status: Former    Packs/day: 1.00    Years: 30.00    Pack years: 30.00    Types: Cigarettes    Quit date: 03/26/2021    Years since quitting: 0.1   Smokeless tobacco: Never  Substance Use Topics   Alcohol use: Yes   Drug use: Yes    Types: Marijuana    Comment: quit approx. 5 years ago     Colonoscopy:  PAP:  Bone density:  Lipid panel:  No Known Allergies  Current Outpatient Medications  Medication Sig Dispense Refill   albuterol (VENTOLIN HFA) 108 (90 Base) MCG/ACT inhaler Inhale 2 puffs into the lungs every 6 (six) hours as needed for wheezing or shortness of breath. 8 g 2   ALPRAZolam (XANAX) 0.5 MG tablet Take 1 tablet (0.5 mg total) by mouth 2 (two) times daily as needed for anxiety. 20 tablet 0   budesonide (PULMICORT) 180 MCG/ACT inhaler Inhale 1 puff into the lungs daily. 1 each 2   feeding supplement (ENSURE ENLIVE / ENSURE PLUS) LIQD Take 237 mLs by mouth 2 (two) times daily between meals. 237 mL 12   guaiFENesin (MUCINEX) 600 MG 12 hr tablet Take 600 mg by mouth 2 (two) times daily as needed for cough or to loosen phlegm.     ibuprofen (ADVIL) 400 MG tablet Take 400 mg by mouth every 6 (six) hours as needed.  lidocaine-prilocaine (EMLA) cream Apply to affected area once 30 g 3   Multiple Vitamin (MULTIVITAMIN WITH MINERALS) TABS tablet Take 1 tablet by mouth daily. 90 tablet 1   ondansetron (ZOFRAN) 8 MG tablet Take 1 tablet (8 mg total) by mouth 2 (two) times daily as needed for refractory nausea / vomiting. 60 tablet 2   prochlorperazine (COMPAZINE) 10 MG tablet Take 1 tablet (10 mg total) by mouth every 6 (six) hours as needed (Nausea or vomiting). 60 tablet 2   No current facility-administered medications for this  visit.    OBJECTIVE: There were no vitals filed for this visit.    There is no height or weight on file to calculate BMI.    ECOG FS:2 - Symptomatic, <50% confined to bed  General: Well-developed, well-nourished, mild respiratory distress. Eyes: Pink conjunctiva, anicteric sclera. HEENT: Normocephalic, moist mucous membranes. Lungs: Diminished breath sounds. Heart: Regular rate and rhythm. Abdomen: Soft, nontender, no obvious distention. Musculoskeletal: No edema, cyanosis, or clubbing. Neuro: Alert, answering all questions appropriately. Cranial nerves grossly intact. Skin: No rashes or petechiae noted. Psych: Normal affect. Lymphatics: No cervical, calvicular, axillary or inguinal LAD.   LAB RESULTS:  Lab Results  Component Value Date   NA 138 05/22/2021   K 3.4 (L) 05/22/2021   CL 93 (L) 05/22/2021   CO2 34 (H) 05/22/2021   GLUCOSE 119 (H) 05/22/2021   BUN 20 05/22/2021   CREATININE 0.67 05/22/2021   CALCIUM 9.4 05/22/2021   PROT 6.7 05/14/2021   ALBUMIN 3.7 05/14/2021   AST 30 05/14/2021   ALT 33 05/14/2021   ALKPHOS 100 05/14/2021   BILITOT 1.1 05/14/2021   GFRNONAA >60 05/22/2021    Lab Results  Component Value Date   WBC 16.4 (H) 05/22/2021   NEUTROABS 8.4 (H) 05/14/2021   HGB 15.6 05/22/2021   HCT 47.9 05/22/2021   MCV 88.9 05/22/2021   PLT 222 05/22/2021     STUDIES: DG Chest 1 View  Result Date: 05/14/2021 CLINICAL DATA:  Shortness of breath for couple of months. EXAM: CHEST  1 VIEW COMPARISON:  None. FINDINGS: Mild cardiac enlargement. Perihilar infiltrates bilaterally may represent edema or multifocal pneumonia. No pleural effusions. No pneumothorax. Mediastinal contours appear intact. IMPRESSION: Bilateral perihilar infiltrates. Electronically Signed   By: Lucienne Capers M.D.   On: 05/14/2021 23:11   CT Angio Chest PE W/Cm &/Or Wo Cm  Result Date: 05/15/2021 CLINICAL DATA:  Hemoptysis EXAM: CT ANGIOGRAPHY CHEST WITH CONTRAST TECHNIQUE:  Multidetector CT imaging of the chest was performed using the standard protocol during bolus administration of intravenous contrast. Multiplanar CT image reconstructions and MIPs were obtained to evaluate the vascular anatomy. CONTRAST:  140m OMNIPAQUE IOHEXOL 350 MG/ML SOLN COMPARISON:  None. FINDINGS: Cardiovascular: Contrast injection is sufficient to demonstrate satisfactory opacification of the pulmonary arteries to the segmental level. There is no pulmonary embolus or evidence of right heart strain. The size of the main pulmonary artery is normal. Proximal left pulmonary arteries are severely narrowed by confluent soft tissue surrounding the left hilum. The course and caliber of the aorta are normal. There is atherosclerotic calcification. Opacification decreased due to pulmonary arterial phase contrast bolus timing. There is a small pericardial effusion. Mediastinum/Nodes: There is bulky abnormal mediastinal soft tissue with in case mint of the left common carotid artery and proximal left subclavian artery. There are multiple enlarged mediastinal lymph nodes. Level 4 are node measures 10 mm. Lungs/Pleura: There is confluent soft tissue at the left hilum encasing the main left  pulmonary artery and severely narrowing the proximal lobar branches. Abnormal soft tissue extends in a peribronchial pattern. There is also abnormal soft tissue at the right hilum. There is atelectasis within the right middle lobe and an area of consolidation in the lingula. Small pleural effusions. Upper Abdomen: Contrast bolus timing is not optimized for evaluation of the abdominal organs. There is an intermediate density 2.0 x 1.3 cm right adrenal nodule. There is a lower density left adrenal nodule measuring 1.5 cm. Musculoskeletal: No chest wall abnormality. No bony spinal canal stenosis. Review of the MIP images confirms the above findings. IMPRESSION: 1. No pulmonary embolus or acute aortic syndrome. 2. Bulky abnormal soft tissue  at the left hilum encasing the main left pulmonary artery and severely narrowing the proximal lobar branches, consistent with malignancy. 3. Multiple enlarged mediastinal lymph nodes, consistent with lymphatic metastatic disease. 4. Bilateral adrenal nodules, measuring up to 2.0 x 1.3 cm. The right adrenal nodule is concerning for metastatic disease. The left may be an adenoma. 5. Small pericardial effusion and small pleural effusions. 6. Consolidation in the lingula may be postobstructive. Aortic atherosclerosis (ICD10-I70.0). Electronically Signed   By: Ulyses Jarred M.D.   On: 05/15/2021 01:57   MR BRAIN W WO CONTRAST  Result Date: 05/18/2021 CLINICAL DATA:  Lung cancer diagnosis.  Staging. EXAM: MRI HEAD WITHOUT AND WITH CONTRAST TECHNIQUE: Multiplanar, multiecho pulse sequences of the brain and surrounding structures were obtained without and with intravenous contrast. CONTRAST:  38m GADAVIST GADOBUTROL 1 MMOL/ML IV SOLN COMPARISON:  None. FINDINGS: Brain: Study suffers from motion degradation. Diffusion imaging does not show any acute or subacute infarction or other cause of restricted diffusion. No evidence of chronic small vessel disease or old cortical infarction. Very few punctate foci of T2 and FLAIR signal within the white matter. No sign of mass lesion, hemorrhage, hydrocephalus or extra-axial collection. No abnormal contrast enhancement occurs. Vascular: Major vessels at the base of the brain show flow. Skull and upper cervical spine: Negative Sinuses/Orbits: Clear/normal Other: None IMPRESSION: No evidence of regional metastatic disease. Sensitivity is slightly limited by motion degradation and utilization of fast scanning protocol. I do not see any suspicious findings. Electronically Signed   By: MNelson ChimesM.D.   On: 05/18/2021 13:44   CT ABDOMEN PELVIS W CONTRAST  Result Date: 05/15/2021 CLINICAL DATA:  Abnormal left hilar soft tissue noted on chest CTA. This was concerning for  malignancy. Additional evaluation to assess for possible metastatic disease. EXAM: CT ABDOMEN AND PELVIS WITH CONTRAST TECHNIQUE: Multidetector CT imaging of the abdomen and pelvis was performed using the standard protocol following bolus administration of intravenous contrast. CONTRAST:  854mOMNIPAQUE IOHEXOL 350 MG/ML SOLN COMPARISON:  Current chest CTA. FINDINGS: Lower chest: Small right effusion. Left lung base subcentimeter nodules. Small patchy areas of ground-glass opacity at the left lung base. Pericardial effusion. No change from the earlier chest CTA. Hepatobiliary: Subtle liver lesions suspicious for metastatic disease. Largest lies in the right lobe, segment 6, 1.4 cm in size. Liver normal in size and overall attenuation. Mildly distended gallbladder. Increased attenuation material in the dependent gallbladder possibly small stones or sludge. No wall thickening or inflammation. No bile duct dilation. Pancreas: Unremarkable. No pancreatic ductal dilatation or surrounding inflammatory changes. Spleen: Normal in size without focal abnormality. Adrenals/Urinary Tract: Bilateral adrenal masses, right 2.1 x 1.4 cm, left 1.1 cm size. Symmetric renal enhancement and excretion. No renal masses, stones or hydronephrosis. Normal ureters. Bladder minimally distended. Wall appears thickened. No bladder mass.  Stomach/Bowel: Normal stomach. Small bowel and colon are normal in caliber. No wall thickening. No inflammation. Normal appendix suggested. No evidence of appendicitis. Vascular/Lymphatic: Mild aortic atherosclerosis.  No aneurysm. Prominent lymph nodes above the celiac axis, largest 1.2 cm in short axis. Prominent but subcentimeter retroperitoneal lymph nodes. No other enlarged nodes. Reproductive: Significant prostate enlargement elevating the bladder base. Prostate measures 6.9 cm superior to inferior by 5.8 x 5.6 cm transversely. Other: No abdominal wall hernia or abnormality. No abdominopelvic ascites.  Musculoskeletal: Small area of sclerosis in the right ilium, likely a bone island. More definitive bone island noted in the anterior left femoral head. Vague sclerotic lesion along the lower endplate of T9. No osteolytic lesions. No fracture or acute finding. IMPRESSION: 1. Subtle low-density liver lesions consistent with metastatic disease. 2. Bilateral adrenal masses suspicious for metastatic disease. 3. Prominent lymph nodes above the celiac axis suspicious for metastatic disease. 4. Subtle areas of sclerosis, right ilium and lower endplate of T9, nonspecific, possibly metastatic disease. 5. No acute findings. Electronically Signed   By: Lajean Manes M.D.   On: 05/15/2021 17:56   MR LIVER W WO CONTRAST  Result Date: 05/18/2021 CLINICAL DATA:  Inpatient. Acute respiratory failure with infiltrative left upper lobe soft tissue and bilateral mediastinal adenopathy suspicious on chest CT for lung malignancy. Indeterminate liver and bilateral adrenal lesions on CT abdomen study. EXAM: MRI ABDOMEN WITHOUT AND WITH CONTRAST TECHNIQUE: Multiplanar multisequence MR imaging of the abdomen was performed both before and after the administration of intravenous contrast. CONTRAST:  3m GADAVIST GADOBUTROL 1 MMOL/ML IV SOLN COMPARISON:  05/15/2021 CT abdomen/pelvis and chest CT angiogram. FINDINGS: Substantially motion degraded exam, limiting assessment. Lower chest: Small right and trace left dependent pleural effusions. Geographic consolidation in the visualized lingula and right middle lobe. Hepatobiliary: Normal liver size and configuration. No hepatic steatosis. There are several (at least 5) mildly T2 hyperintense T1 hypointense hypoenhancing liver masses scattered throughout the liver with restricted diffusion, compatible with metastatic disease, largest 1.5 x 1.3 cm in the segment 6 right liver (series 4/image 20), 1.3 x 1.1 cm in the anterior segment 4 left liver (series 4/image 18) and 1.1 x 1.1 cm in the segment  8 right liver (series 4/image 9). Normal gallbladder with no cholelithiasis. No biliary ductal dilatation. Common bile duct diameter 4 mm. No choledocholithiasis. No biliary masses, strictures or beading. Pancreas: No pancreatic mass or duct dilation.  No pancreas divisum. Spleen: Normal size. No mass. Adrenals/Urinary Tract: Enhancing right adrenal 2.5 x 1.7 cm nodule (series 3/image 18) demonstrates no significant loss of signal intensity on chemical shift imaging and is indeterminate. Similar enhancing left adrenal 1.5 x 1.3 cm nodule (series 3/image 23), too small to characterize for intralesional lipid on the motion degraded chemical shift images. No hydronephrosis. Scattered bilateral simple renal cysts, largest 1.6 cm in the anterior upper left kidney. T2 hypointense T1 hyperintense 1.1 cm renal cortical lesion in the lower right kidney (series 4/image 33) without compelling enhancement on the motion degraded postcontrast sequences, compatible with hemorrhagic/proteinaceous Bosniak category 2 renal cyst. No overtly suspicious renal masses. Stomach/Bowel: Normal non-distended stomach. Visualized small and large bowel is normal caliber, with no bowel wall thickening. Vascular/Lymphatic: Normal caliber abdominal aorta. Patent portal, splenic, hepatic and renal veins. A few mildly enlarged left para-aortic nodes, largest 1.2 cm short axis diameter (series 18/image 66). Enlarged 1.5 cm high left retroperitoneal node anterior to the left diaphragmatic crus (series 18/image 37). Other: No abdominal ascites or focal fluid collection.  Musculoskeletal: Enhancing inferior T9 vertebral 2.8 cm and right T10 vertebral 1.1 cm bone lesions (series 17/image 19). Enhancing 2.1 cm medial right iliac bone lesion (series 17/image 19). IMPRESSION: 1. Limited motion degraded scan. 2. Several (at least 5) hypoenhancing liver masses scattered throughout the liver, with MRI features compatible with metastatic disease. Percutaneous  biopsy of the 1.5 cm segment 6 right liver or 1.3 cm anterior segment 4 left liver lesions could be considered. 3. Indeterminate enhancing bilateral adrenal nodules, measuring 2.5 cm on the right and 1.5 cm on the left. Metastatic disease is not excluded. 4. Left para-aortic and high left retroperitoneal lymphadenopathy suspicious for metastatic disease. 5. Enhancing T9 and T10 vertebral and medial right iliac bone lesions, suspicious for bone metastases. 6. Small right and trace left dependent pleural effusions. Geographic consolidation in the visualized lingula and right middle lobe, nonspecific, please see recent chest CT angiogram study for details. Electronically Signed   By: Ilona Sorrel M.D.   On: 05/18/2021 16:39   [place] CORE BIOPSY (LYMPH NODES)  Result Date: 05/20/2021 INDICATION: Possible lung carcinoma by prior imaging with vague liver lesions and also enlarged left supraclavicular lymph node by CT. Due to oxygen requirement and poor respiratory status, biopsy of the left supraclavicular lymph node is performed without sedation as tolerance a liver biopsy currently with sedation would be borderline and potentially high risk. EXAM: ULTRASOUND GUIDED CORE BIOPSY OF LEFT SUPRACLAVICULAR LYMPH NODE MEDICATIONS: None. ANESTHESIA/SEDATION: None PROCEDURE: The procedure, risks, benefits, and alternatives were explained to the patient. Questions regarding the procedure were encouraged and answered. The patient understands and consents to the procedure. A time-out was performed prior to initiating the procedure. The left neck was prepped with chlorhexidine in a sterile fashion, and a sterile drape was applied covering the operative field. A sterile gown and sterile gloves were used for the procedure. Local anesthesia was provided with 1% Lidocaine. After localizing an enlarged left supraclavicular lymph node, an 18 gauge core biopsy device was utilized in obtaining 4 separate core biopsy samples. Material was  submitted in formalin. Additional ultrasound was performed. COMPLICATIONS: None immediate. FINDINGS: Several left supraclavicular and lower cervical lymph nodes are identified. The largest measures approximately 1.7 x 1.3 x 1.4 cm by ultrasound. Solid core biopsy samples were obtained from this lymph node. IMPRESSION: Ultrasound-guided core biopsy performed enlarged left supraclavicular lymph node. Electronically Signed   By: Aletta Edouard M.D.   On: 05/20/2021 11:02   IR IMAGING GUIDED PORT INSERTION  Result Date: 06/03/2021 INDICATION: Metastatic lung cancer.  Chemotherapy. EXAM: IMPLANTED PORT A CATH PLACEMENT WITH ULTRASOUND AND FLUOROSCOPIC GUIDANCE MEDICATIONS: None ANESTHESIA/SEDATION: Moderate (conscious) sedation was employed during this procedure. A total of Versed 0 mg and Fentanyl 25 mcg was administered intravenously. Moderate Sedation Time: 20 minutes. The patient's level of consciousness and vital signs were monitored continuously by radiology nursing throughout the procedure under my direct supervision. FLUOROSCOPY TIME:  0 minutes, 24 seconds (4.5 mGy) COMPLICATIONS: None immediate. PROCEDURE: The procedure, risks, benefits, and alternatives were explained to the patient. Questions regarding the procedure were encouraged and answered. The patient understands and consents to the procedure. The neck and chest were prepped with chlorhexidine in a sterile fashion, and a sterile drape was applied covering the operative field. Maximum barrier sterile technique with sterile gowns and gloves were used for the procedure. A timeout was performed prior to the initiation of the procedure. Local anesthesia was provided with 1% lidocaine with epinephrine. After creating a small venotomy incision, a  micropuncture kit was utilized to access the internal jugular vein under direct, real-time ultrasound guidance. Ultrasound image documentation was performed. The microwire was kinked to measure appropriate  catheter length. A subcutaneous port pocket was then created along the upper chest wall utilizing a combination of sharp and blunt dissection. The pocket was irrigated with sterile saline. A single lumen ISP power injectable port was chosen for placement. The 8 Fr catheter was tunneled from the port pocket site to the venotomy incision. The port was placed in the pocket. The external catheter was trimmed to appropriate length. At the venotomy, an 8 Fr peel-away sheath was placed over a guidewire under fluoroscopic guidance. The catheter was then placed through the sheath and the sheath was removed. Final catheter positioning was confirmed and documented with a fluoroscopic spot radiograph. The port was accessed with a Huber needle, aspirated and flushed with heparinized saline. The port pocket incision was closed with interrupted 3-0 Vicryl suture then Dermabond was applied, including at the venotomy incision. Dressings were placed. The patient tolerated the procedure well without immediate post procedural complication. IMPRESSION: Successful placement of a right internal jugular approach power injectable Port-A-Cath. The catheter is ready for immediate use. Michaelle Birks, MD [facility] Electronically Signed   By: Michaelle Birks M.D.   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  MRI of the brain on May 18, 2021 negative for disease.  Patient will require PET scan which is scheduled next week to complete the staging work-up.  There was insufficient tissue for ancillary studies, therefore will send liquid biopsy to assess for any underlying mutations and PD-L1 status.  Pending mutational status, patient will receive carboplatinum, Taxol, and Avastin every 3  weeks for 4-6 cycles with Udenyca support with the cycle.  He will require port placement prior to initiating treatment. Shortness of breath/oxygen requirement: Patient has follow-up with pulmonology in the near future. Leukocytosis: Likely reactive, monitor.  Patient expressed understanding and was in agreement with this plan. He also understands that He can call clinic at any time with any questions, concerns, or complaints.   Cancer Staging Adenocarcinoma of left lung [facility]) Staging form: Lung, AJCC 8th Edition - Clinical stage from 05/27/2021: Stage IVB (cTX, cN3, pM1c) - Signed by Lloyd Huger, MD on 05/27/2021  Lloyd Huger, MD   06/04/2021 9:10 AM

## 2021-06-05 ENCOUNTER — Encounter: Payer: Self-pay | Admitting: Oncology

## 2021-06-06 ENCOUNTER — Inpatient Hospital Stay: Payer: Non-veteran care

## 2021-06-07 ENCOUNTER — Emergency Department: Payer: Self-pay

## 2021-06-07 ENCOUNTER — Other Ambulatory Visit: Payer: Self-pay

## 2021-06-07 ENCOUNTER — Encounter: Payer: Self-pay | Admitting: Oncology

## 2021-06-07 ENCOUNTER — Encounter: Payer: Self-pay | Admitting: Physician Assistant

## 2021-06-07 ENCOUNTER — Inpatient Hospital Stay
Admission: EM | Admit: 2021-06-07 | Discharge: 2021-06-09 | DRG: 872 | Disposition: A | Discharge status: Home / Self Care | Payer: Self-pay | Attending: Student | Admitting: Student

## 2021-06-07 ENCOUNTER — Ambulatory Visit
Admission: RE | Admit: 2021-06-07 | Discharge: 2021-06-07 | Disposition: A | Discharge status: Home / Self Care | Payer: Non-veteran care | Source: Ambulatory Visit | Attending: Oncology | Admitting: Oncology

## 2021-06-07 DIAGNOSIS — C3412 Malignant neoplasm of upper lobe, left bronchus or lung: Secondary | ICD-10-CM | POA: Diagnosis present

## 2021-06-07 DIAGNOSIS — Z7951 Long term (current) use of inhaled steroids: Secondary | ICD-10-CM

## 2021-06-07 DIAGNOSIS — C787 Secondary malignant neoplasm of liver and intrahepatic bile duct: Secondary | ICD-10-CM | POA: Diagnosis present

## 2021-06-07 DIAGNOSIS — J449 Chronic obstructive pulmonary disease, unspecified: Secondary | ICD-10-CM | POA: Insufficient documentation

## 2021-06-07 DIAGNOSIS — Z515 Encounter for palliative care: Secondary | ICD-10-CM

## 2021-06-07 DIAGNOSIS — C3492 Malignant neoplasm of unspecified part of left bronchus or lung: Secondary | ICD-10-CM

## 2021-06-07 DIAGNOSIS — Z87891 Personal history of nicotine dependence: Secondary | ICD-10-CM

## 2021-06-07 DIAGNOSIS — Z20822 Contact with and (suspected) exposure to covid-19: Secondary | ICD-10-CM | POA: Diagnosis present

## 2021-06-07 DIAGNOSIS — J9621 Acute and chronic respiratory failure with hypoxia: Secondary | ICD-10-CM

## 2021-06-07 DIAGNOSIS — E872 Acidosis: Secondary | ICD-10-CM | POA: Diagnosis present

## 2021-06-07 DIAGNOSIS — F419 Anxiety disorder, unspecified: Secondary | ICD-10-CM | POA: Diagnosis present

## 2021-06-07 DIAGNOSIS — J9611 Chronic respiratory failure with hypoxia: Secondary | ICD-10-CM | POA: Diagnosis present

## 2021-06-07 DIAGNOSIS — Z79899 Other long term (current) drug therapy: Secondary | ICD-10-CM

## 2021-06-07 DIAGNOSIS — Z9981 Dependence on supplemental oxygen: Secondary | ICD-10-CM

## 2021-06-07 DIAGNOSIS — E669 Obesity, unspecified: Secondary | ICD-10-CM | POA: Diagnosis present

## 2021-06-07 DIAGNOSIS — J441 Chronic obstructive pulmonary disease with (acute) exacerbation: Secondary | ICD-10-CM | POA: Diagnosis present

## 2021-06-07 DIAGNOSIS — R918 Other nonspecific abnormal finding of lung field: Secondary | ICD-10-CM

## 2021-06-07 DIAGNOSIS — Z6832 Body mass index (BMI) 32.0-32.9, adult: Secondary | ICD-10-CM

## 2021-06-07 DIAGNOSIS — R6520 Severe sepsis without septic shock: Secondary | ICD-10-CM | POA: Diagnosis present

## 2021-06-07 DIAGNOSIS — A419 Sepsis, unspecified organism: Principal | ICD-10-CM | POA: Diagnosis present

## 2021-06-07 LAB — COMPREHENSIVE METABOLIC PANEL
ALT: 19 U/L (ref 0–44)
AST: 25 U/L (ref 15–41)
Albumin: 3.3 g/dL — ABNORMAL LOW (ref 3.5–5.0)
Alkaline Phosphatase: 109 U/L (ref 38–126)
Anion gap: 15 (ref 5–15)
BUN: 18 mg/dL (ref 8–23)
CO2: 28 mmol/L (ref 22–32)
Calcium: 9.6 mg/dL (ref 8.9–10.3)
Chloride: 92 mmol/L — ABNORMAL LOW (ref 98–111)
Creatinine, Ser: 0.7 mg/dL (ref 0.61–1.24)
GFR, Estimated: >60 mL/min (ref >60)
Glucose, Bld: 103 mg/dL — ABNORMAL HIGH (ref 70–99)
Potassium: 4.3 mmol/L (ref 3.5–5.1)
Sodium: 135 mmol/L (ref 135–145)
Total Bilirubin: 1.1 mg/dL (ref 0.3–1.2)
Total Protein: 7.2 g/dL (ref 6.5–8.1)

## 2021-06-07 LAB — LIPASE, BLOOD: Lipase: 32 U/L (ref 11–51)

## 2021-06-07 LAB — CBC
HCT: 44.4 % (ref 39.0–52.0)
Hemoglobin: 15 g/dL (ref 13.0–17.0)
MCH: 28.1 pg (ref 26.0–34.0)
MCHC: 33.8 g/dL (ref 30.0–36.0)
MCV: 83.1 fL (ref 80.0–100.0)
Platelets: 259 10*3/uL (ref 150–400)
RBC: 5.34 MIL/uL (ref 4.22–5.81)
RDW: 12.3 % (ref 11.5–15.5)
WBC: 10.1 10*3/uL (ref 4.0–10.5)
nRBC: 0 % (ref 0.0–0.2)

## 2021-06-07 LAB — TROPONIN I (HIGH SENSITIVITY): Troponin I (High Sensitivity): 9 ng/L (ref <18)

## 2021-06-07 LAB — RESP PANEL BY RT-PCR (FLU A&B, COVID) ARPGX2
Influenza A by PCR: NEGATIVE
Influenza B by PCR: NEGATIVE
SARS Coronavirus 2 by RT PCR: NEGATIVE

## 2021-06-07 LAB — BRAIN NATRIURETIC PEPTIDE: B Natriuretic Peptide: 17 pg/mL (ref 0.0–100.0)

## 2021-06-07 LAB — LACTIC ACID, PLASMA
Lactic Acid, Venous: 2.9 mmol/L (ref 0.5–1.9)
Lactic Acid, Venous: 1.2 mmol/L (ref 0.5–1.9)
Lactic Acid, Venous: 1.7 mmol/L (ref 0.5–1.9)

## 2021-06-07 LAB — PROCALCITONIN: Procalcitonin: <0.1 ng/mL

## 2021-06-07 LAB — PROTIME-INR
INR: 1 (ref 0.8–1.2)
Prothrombin Time: 13.4 seconds (ref 11.4–15.2)

## 2021-06-07 MED ORDER — AZITHROMYCIN 250 MG PO TABS
250.0000 mg | ORAL_TABLET | Freq: Every day | ORAL | Status: DC
  Administered 2021-06-08 – 2021-06-09 (×2): 250 mg via ORAL
  Filled 2021-06-07 (×2): qty 1

## 2021-06-07 MED ORDER — IPRATROPIUM-ALBUTEROL 0.5-2.5 (3) MG/3ML IN SOLN
3.0000 mL | Freq: Once | RESPIRATORY_TRACT | Status: AC
  Administered 2021-06-07: 3 mL via RESPIRATORY_TRACT
  Filled 2021-06-07: qty 3

## 2021-06-07 MED ORDER — ALPRAZOLAM 0.5 MG PO TABS
0.5000 mg | ORAL_TABLET | Freq: Two times a day (BID) | ORAL | Status: DC | PRN
  Administered 2021-06-08 (×2): 0.5 mg via ORAL
  Filled 2021-06-07 (×2): qty 1

## 2021-06-07 MED ORDER — IPRATROPIUM-ALBUTEROL 0.5-2.5 (3) MG/3ML IN SOLN
3.0000 mL | RESPIRATORY_TRACT | Status: DC
  Administered 2021-06-07 – 2021-06-08 (×5): 3 mL via RESPIRATORY_TRACT
  Filled 2021-06-07 (×5): qty 3

## 2021-06-07 MED ORDER — DIPHENHYDRAMINE HCL 25 MG PO CAPS
25.0000 mg | ORAL_CAPSULE | Freq: Four times a day (QID) | ORAL | Status: DC
  Administered 2021-06-07 – 2021-06-09 (×6): 25 mg via ORAL
  Filled 2021-06-07 (×13): qty 1

## 2021-06-07 MED ORDER — ACETAMINOPHEN 325 MG PO TABS
650.0000 mg | ORAL_TABLET | Freq: Four times a day (QID) | ORAL | Status: DC | PRN
Start: 2021-06-07 — End: 2021-06-09

## 2021-06-07 MED ORDER — ENOXAPARIN SODIUM 60 MG/0.6ML IJ SOSY
0.5000 mg/kg | PREFILLED_SYRINGE | INTRAMUSCULAR | Status: DC
  Administered 2021-06-07 – 2021-06-08 (×2): 52.5 mg via SUBCUTANEOUS
  Filled 2021-06-07 (×3): qty 0.53

## 2021-06-07 MED ORDER — SODIUM CHLORIDE 0.9 % IV BOLUS
1500.0000 mL | Freq: Once | INTRAVENOUS | Status: AC
  Administered 2021-06-07: 1500 mL via INTRAVENOUS

## 2021-06-07 MED ORDER — ORAL CARE MOUTH RINSE: 15.0000 mL | Freq: Two times a day (BID) | OROMUCOSAL | Status: DC

## 2021-06-07 MED ORDER — ALBUTEROL SULFATE (2.5 MG/3ML) 0.083% IN NEBU: 2.5000 mg | INHALATION_SOLUTION | RESPIRATORY_TRACT | Status: DC | PRN

## 2021-06-07 MED ORDER — SODIUM CHLORIDE 0.9 % IV BOLUS
1000.0000 mL | Freq: Once | INTRAVENOUS | Status: AC
  Administered 2021-06-07: 1000 mL via INTRAVENOUS

## 2021-06-07 MED ORDER — AZITHROMYCIN 250 MG PO TABS
500.0000 mg | ORAL_TABLET | Freq: Every day | ORAL | Status: AC
  Administered 2021-06-07: 500 mg via ORAL
  Filled 2021-06-07: qty 2

## 2021-06-07 MED ORDER — IBUPROFEN 400 MG PO TABS: 400.0000 mg | ORAL_TABLET | Freq: Four times a day (QID) | ORAL | Status: DC | PRN

## 2021-06-07 MED ORDER — ONDANSETRON HCL 4 MG/2ML IJ SOLN
4.0000 mg | Freq: Once | INTRAMUSCULAR | Status: AC
  Administered 2021-06-07: 4 mg via INTRAVENOUS
  Filled 2021-06-07: qty 2

## 2021-06-07 MED ORDER — ADULT MULTIVITAMIN W/MINERALS CH
1.0000 | ORAL_TABLET | Freq: Every day | ORAL | Status: DC
  Administered 2021-06-08 – 2021-06-09 (×2): 1 via ORAL
  Filled 2021-06-07 (×2): qty 1

## 2021-06-07 MED ORDER — CHLORHEXIDINE GLUCONATE 0.12 % MT SOLN
15.0000 mL | Freq: Two times a day (BID) | OROMUCOSAL | Status: DC
  Administered 2021-06-08 – 2021-06-09 (×3): 15 mL via OROMUCOSAL
  Filled 2021-06-07 (×3): qty 15

## 2021-06-07 MED ORDER — METHYLPREDNISOLONE SODIUM SUCC 125 MG IJ SOLR
60.0000 mg | Freq: Two times a day (BID) | INTRAMUSCULAR | Status: DC
  Administered 2021-06-07 – 2021-06-09 (×4): 60 mg via INTRAVENOUS
  Filled 2021-06-07 (×4): qty 2

## 2021-06-07 MED ORDER — ENOXAPARIN SODIUM 40 MG/0.4ML IJ SOSY: 40.0000 mg | PREFILLED_SYRINGE | INTRAMUSCULAR | Status: DC

## 2021-06-07 MED ORDER — IOHEXOL 350 MG/ML SOLN
75.0000 mL | Freq: Once | INTRAVENOUS | Status: AC | PRN
  Administered 2021-06-07: 75 mL via INTRAVENOUS

## 2021-06-07 MED ORDER — SODIUM CHLORIDE 0.9 % IV SOLN: INTRAVENOUS | Status: DC

## 2021-06-07 MED ORDER — DM-GUAIFENESIN ER 30-600 MG PO TB12
1.0000 | ORAL_TABLET | Freq: Two times a day (BID) | ORAL | Status: DC | PRN
  Administered 2021-06-07 – 2021-06-09 (×3): 1 via ORAL
  Filled 2021-06-07 (×3): qty 1

## 2021-06-07 MED ORDER — ENSURE ENLIVE PO LIQD
237.0000 mL | Freq: Two times a day (BID) | ORAL | Status: DC
  Administered 2021-06-08 – 2021-06-09 (×2): 237 mL via ORAL

## 2021-06-07 MED ORDER — LORAZEPAM 2 MG/ML IJ SOLN
INTRAMUSCULAR | Status: AC
  Filled 2021-06-07: qty 1

## 2021-06-07 MED ORDER — ONDANSETRON HCL 4 MG/2ML IJ SOLN
4.0000 mg | Freq: Three times a day (TID) | INTRAMUSCULAR | Status: DC | PRN
  Administered 2021-06-07 – 2021-06-09 (×2): 4 mg via INTRAVENOUS
  Filled 2021-06-07 (×2): qty 2

## 2021-06-07 MED ORDER — LORAZEPAM 2 MG/ML IJ SOLN
1.0000 mg | Freq: Once | INTRAMUSCULAR | Status: AC
  Administered 2021-06-07: 1 mg via INTRAVENOUS
  Filled 2021-06-07: qty 1

## 2021-06-07 MED ORDER — MAGNESIUM SULFATE 2 GM/50ML IV SOLN
2.0000 g | Freq: Once | INTRAVENOUS | Status: AC
  Administered 2021-06-07: 2 g via INTRAVENOUS
  Filled 2021-06-07: qty 50

## 2021-06-07 NOTE — ED Notes (Signed)
EDP made aware of patients low BP.

## 2021-06-07 NOTE — ED Notes (Signed)
Patient tripod breathing. Requesting breathing treatment. MD aware.

## 2021-06-07 NOTE — H&P (Addendum)
History and Physical    Derek Clarke VQQ:[id number] DOB: 1956-08-18 DOA: 06/07/2021  Referring MD/NP/PA:   PCP: Pcp, No   Patient coming from:  The patient is coming from home.  At baseline, pt is independent for most of ADL.        Chief Complaint: SOB  HPI: Derek Clarke is a 66 y.o. male with medical history significant of COPD, chronic respiratory failure on 4 L oxygen at home, adenocarcinoma for lung, former smoker, anxiety, who presents with shortness breath.  Patient is scheduled for PET scan due to lung adenocarcinoma. Before doing the test, he developed shortness of breath cough and tachycardia.  His shortness breath has been progressively worsening.  He coughs up greenish colored sputum which is thick, difficult to get out per pt.  Patient had some right upper chest pain earlier, which has resolved.  Currently no chest pain. No fever or chills.  Patient is sweating. Patient has nausea, but no vomiting, diarrhea or abdominal pain.  No symptoms of UTI.  Patient had hypotension initially with blood pressure 81/57, which improved to 171/107, then 113/72 after giving 1 L normal saline bolus in ED.  ED Course: pt was found to have WBC 18.1, negative COVID PCR, troponin level 9, lactic acid of 2.9, electrolytes renal function okay, temperature 97.9, heart rate 118, RR 37, oxygen saturation 91-96% on home level of a 42 oxygen.  Chest x-ray showed left upper lobe opacity which is likely due to lung cancer.  CT angiogram is negative for PE.  Patient is admitted to progressive bed as inpatient.  Dr. Grayland Ormond of oncology is consulted  CTA of chest: 1. No evidence of pulmonary embolus. 2. Persistent left hilar mass surrounding the left hilar vessels, with increasing near total consolidation of the left upper lobe since prior study. This may be progression of malignancy versus postobstructive change. 3. Continued coalescing lymphadenopathy throughout the mediastinum and bilateral hila. 4.  Bilateral bronchial wall thickening, greatest at the lung bases. 5. Trace bilateral pleural effusions. 6. Segmental wall thickening of the mid to distal thoracic esophagus, which could be inflammatory or infectious. 7. Upper abdominal metastases with continued multiple liver lesions, bilateral adrenal nodularity, and upper abdominal adenopathy.  Review of Systems:   General: no fevers, chills, no body weight gain, has fatigue HEENT: no blurry vision, hearing changes or sore throat Respiratory: has dyspnea, coughing, wheezing CV: no chest pain, no palpitations GI: has nausea, no vomiting, abdominal pain, diarrhea, constipation GU: no dysuria, burning on urination, increased urinary frequency, hematuria  Ext: has leg edema Neuro: no unilateral weakness, numbness, or tingling, no vision change or hearing loss Skin: no rash, no skin tear. MSK: No muscle spasm, no deformity, no limitation of range of movement in spin Heme: No easy bruising.  Travel history: No recent long distant travel.  Allergy: No Known Allergies  Past Medical History:  Diagnosis Date   Adenocarcinoma ([place])    lung;   COPD (chronic obstructive pulmonary disease) ([place])     Past Surgical History:  Procedure Laterality Date   IR IMAGING GUIDED PORT INSERTION  06/03/2021    Social History:  reports that he quit smoking about 2 months ago. His smoking use included cigarettes. He has a 30.00 pack-year smoking history. He has never used smokeless tobacco. He reports current alcohol use. He reports current drug use. Drug: Marijuana.  Family History:  Family History  Problem Relation Age of Onset   Lung disease Mother    Dementia  Father      Prior to Admission medications   Medication Sig Start Date End Date Taking? Authorizing Provider  albuterol (VENTOLIN HFA) 108 (90 Base) MCG/ACT inhaler Inhale 2 puffs into the lungs every 6 (six) hours as needed for wheezing or shortness of breath. 05/21/21   Lorella Nimrod, MD   ALPRAZolam Duanne Moron) 0.5 MG tablet Take 1 tablet (0.5 mg total) by mouth 2 (two) times daily as needed for anxiety. 05/22/21   Lorella Nimrod, MD  budesonide (PULMICORT) 180 MCG/ACT inhaler Inhale 1 puff into the lungs daily. 05/21/21   Lorella Nimrod, MD  feeding supplement (ENSURE ENLIVE / ENSURE PLUS) LIQD Take 237 mLs by mouth 2 (two) times daily between meals. 05/22/21   Lorella Nimrod, MD  guaiFENesin (MUCINEX) 600 MG 12 hr tablet Take 600 mg by mouth 2 (two) times daily as needed for cough or to loosen phlegm.    [provider]  ibuprofen (ADVIL) 400 MG tablet Take 400 mg by mouth every 6 (six) hours as needed.    [provider]  lidocaine-prilocaine (EMLA) cream Apply to affected area once 05/27/21   Lloyd Huger, MD  Multiple Vitamin (MULTIVITAMIN WITH MINERALS) TABS tablet Take 1 tablet by mouth daily. 05/22/21   Lorella Nimrod, MD  ondansetron (ZOFRAN) 8 MG tablet Take 1 tablet (8 mg total) by mouth 2 (two) times daily as needed for refractory nausea / vomiting. 05/27/21   Lloyd Huger, MD  prochlorperazine (COMPAZINE) 10 MG tablet Take 1 tablet (10 mg total) by mouth every 6 (six) hours as needed (Nausea or vomiting). 05/27/21   Lloyd Huger, MD    Physical Exam: Vitals:   06/07/21 1330 06/07/21 1400 06/07/21 1502 06/07/21 1620  BP: 110/74 122/78 (!) 171/107 113/72  Pulse: (!) 50 (!) 102 (!) 106 100  Resp: 18 17 17 16   Temp:      TempSrc:      SpO2: 99% 97% 96% 94%  Weight:      Height:       General: Not in acute distress HEENT:       Eyes: PERRL, EOMI, no scleral icterus.       ENT: No discharge from the ears and nose, no pharynx injection, no tonsillar enlargement.        Neck: No JVD, no bruit, no mass felt. Heme: No neck lymph node enlargement. Cardiac: S1/S2, RRR, No murmurs, No gallops or rubs. Respiratory: Diffused loud rhonchi, wheezing, coarse breathing sound bilaterally GI: Soft, nondistended, nontender, no rebound pain, no  organomegaly, BS present. GU: No hematuria Ext: 1+ pitting leg edema bilaterally. 1+DP/PT pulse bilaterally. Musculoskeletal: No joint deformities, No joint redness or warmth, no limitation of ROM in spin. Skin: No rashes.  Neuro: Alert, oriented X3, cranial nerves II-XII grossly intact, moves all extremities normally.  Psych: Patient is not psychotic, no suicidal or hemocidal ideation.  Labs on Admission: I have personally reviewed following labs and imaging studies  CBC: Recent Labs  Lab 06/07/21 1050  WBC 10.1  HGB 15.0  HCT 44.4  MCV 83.1  PLT 376   Basic Metabolic Panel: Recent Labs  Lab 06/07/21 1050  NA 135  K 4.3  CL 92*  CO2 28  GLUCOSE 103*  BUN 18  CREATININE 0.70  CALCIUM 9.6   GFR: Estimated Creatinine Clearance: 111 mL/min (by C-G formula based on SCr of 0.7 mg/dL). Liver Function Tests: Recent Labs  Lab 06/07/21 1050  AST 25  ALT 19  ALKPHOS 109  BILITOT 1.1  PROT 7.2  ALBUMIN 3.3*   Recent Labs  Lab 06/07/21 1050  LIPASE 32   No results for input(s): AMMONIA in the last 168 hours. Coagulation Profile: No results for input(s): INR, PROTIME in the last 168 hours. Cardiac Enzymes: No results for input(s): CKTOTAL, CKMB, CKMBINDEX, TROPONINI in the last 168 hours. BNP (last 3 results) No results for input(s): PROBNP in the last 8760 hours. HbA1C: No results for input(s): HGBA1C in the last 72 hours. CBG: No results for input(s): GLUCAP in the last 168 hours. Lipid Profile: No results for input(s): CHOL, HDL, LDLCALC, TRIG, CHOLHDL, LDLDIRECT in the last 72 hours. Thyroid Function Tests: No results for input(s): TSH, T4TOTAL, FREET4, T3FREE, THYROIDAB in the last 72 hours. Anemia Panel: No results for input(s): VITAMINB12, FOLATE, FERRITIN, TIBC, IRON, RETICCTPCT in the last 72 hours. Urine analysis: No results found for: COLORURINE, APPEARANCEUR, LABSPEC, PHURINE, GLUCOSEU, HGBUR, BILIRUBINUR, KETONESUR, PROTEINUR, UROBILINOGEN,  NITRITE, LEUKOCYTESUR Sepsis Labs: @LABRCNTIP (procalcitonin:4,lacticidven:4) ) Recent Results (from the past 240 hour(s))  Resp Panel by RT-PCR (Flu A&B, Covid) Nasopharyngeal Swab     Status: None   Collection Time: 06/07/21 11:21 AM   Specimen: Nasopharyngeal Swab; Nasopharyngeal(NP) swabs in vial transport medium  Result Value Ref Range Status   SARS Coronavirus 2 by RT PCR NEGATIVE NEGATIVE Final    Comment: (NOTE) SARS-CoV-2 target nucleic acids are NOT DETECTED.  The SARS-CoV-2 RNA is generally detectable in upper respiratory specimens during the acute phase of infection. The lowest concentration of SARS-CoV-2 viral copies this assay can detect is 138 copies/mL. A negative result does not preclude SARS-Cov-2 infection and should not be used as the sole basis for treatment or other patient management decisions. A negative result may occur with  improper specimen collection/handling, submission of specimen other than nasopharyngeal swab, presence of viral mutation(s) within the areas targeted by this assay, and inadequate number of viral copies(<138 copies/mL). A negative result must be combined with clinical observations, patient history, and epidemiological information. The expected result is Negative.  Fact Sheet for Patients:  EntrepreneurPulse.com.au  Fact Sheet for Healthcare Providers:  IncredibleEmployment.be  This test is no t yet approved or cleared by the [place] FDA and  has been authorized for detection and/or diagnosis of SARS-CoV-2 by FDA under an Emergency Use Authorization (EUA). This EUA will remain  in effect (meaning this test can be used) for the duration of the COVID-19 declaration under Section 564(b)(1) of the Act, 21 U.S.C.section 360bbb-3(b)(1), unless the authorization is terminated  or revoked sooner.       Influenza A by PCR NEGATIVE NEGATIVE Final   Influenza B by PCR NEGATIVE NEGATIVE Final     Comment: (NOTE) The Xpert Xpress SARS-CoV-2/FLU/RSV plus assay is intended as an aid in the diagnosis of influenza from Nasopharyngeal swab specimens and should not be used as a sole basis for treatment. Nasal washings and aspirates are unacceptable for Xpert Xpress SARS-CoV-2/FLU/RSV testing.  Fact Sheet for Patients: EntrepreneurPulse.com.au  Fact Sheet for Healthcare Providers: IncredibleEmployment.be  This test is not yet approved or cleared by the [place] FDA and has been authorized for detection and/or diagnosis of SARS-CoV-2 by FDA under an Emergency Use Authorization (EUA). This EUA will remain in effect (meaning this test can be used) for the duration of the COVID-19 declaration under Section 564(b)(1) of the Act, 21 U.S.C. section 360bbb-3(b)(1), unless the authorization is terminated or revoked.  Performed at [facility], [address] [place], [place] [address]  Radiological Exams on Admission: CT Angio Chest PE W and/or Wo Contrast  Result Date: 06/07/2021 CLINICAL DATA:  Tachycardia, vomiting, metastatic lung cancer EXAM: CT ANGIOGRAPHY CHEST WITH CONTRAST TECHNIQUE: Multidetector CT imaging of the chest was performed using the standard protocol during bolus administration of intravenous contrast. Multiplanar CT image reconstructions and MIPs were obtained to evaluate the vascular anatomy. CONTRAST:  53mL OMNIPAQUE IOHEXOL 350 MG/ML SOLN COMPARISON:  06/08/2011, 05/15/2021, FINDINGS: Cardiovascular: This is a technically adequate evaluation of the pulmonary vasculature. No filling defects or pulmonary emboli. Stable left hilar soft tissue mass encasing the left upper and left lower pulmonary arteries. Stable trace pericardial effusion. The heart is not enlarged. No evidence of thoracic aortic aneurysm or dissection. Mild atherosclerosis of the aorta and coronary vessels. Mediastinum/Nodes: There is segmental wall  thickening of the mid to distal thoracic esophagus. Confluent soft tissue within the mediastinum and bilateral hila consistent with adenopathy, not grossly changed since prior study. An enlarged left supraclavicular lymph node on image 9/4 measures 13 mm in short axis, unchanged. Thyroid and trachea are unremarkable. Lungs/Pleura: There are trace bilateral pleural effusions. Since the prior exam, there is progressive near complete consolidation of the left upper lobe, which may reflect progressive disease and/or postobstructive change. There is diffuse bilateral bronchial wall thickening, greatest at the lung bases. Continued right middle lobe collapse with occlusion of the right middle lobe bronchus again noted. Upper Abdomen: There are stable bilateral adrenal nodules, measuring up to 2.2 cm on the right. Multiple enlarged lymph nodes are again seen within the central upper abdomen, largest measuring 1.7 cm just posterior to the gastric cardia. Multiple hypodensities are again seen within the liver consistent with metastatic disease, grossly unchanged since prior study. Musculoskeletal: Stable sclerotic focus within the left T10 transverse process and within the T9 vertebral body, compatible with bony metastases. No acute displaced fractures. Reconstructed images demonstrate no additional findings. Review of the MIP images confirms the above findings. IMPRESSION: 1. No evidence of pulmonary embolus. 2. Persistent left hilar mass surrounding the left hilar vessels, with increasing near total consolidation of the left upper lobe since prior study. This may be progression of malignancy versus postobstructive change. 3. Continued coalescing lymphadenopathy throughout the mediastinum and bilateral hila. 4. Bilateral bronchial wall thickening, greatest at the lung bases. 5. Trace bilateral pleural effusions. 6. Segmental wall thickening of the mid to distal thoracic esophagus, which could be inflammatory or infectious.  7. Upper abdominal metastases with continued multiple liver lesions, bilateral adrenal nodularity, and upper abdominal adenopathy. Electronically Signed   By: Randa Ngo M.D.   On: 06/07/2021 15:12   DG Chest Portable 1 View  Result Date: 06/07/2021 CLINICAL DATA:  Cough, shortness of breath. EXAM: PORTABLE CHEST 1 VIEW COMPARISON:  May 14, 2021. FINDINGS: Stable cardiomediastinal silhouette. Interval placement of right internal jugular Port-A-Cath with distal tip in expected position of the SVC. No pneumothorax is noted. Left upper lobe consolidation is noted concerning for pneumonia or atelectasis. Probable central pulmonary vascular congestion is noted. Bony thorax is unremarkable. IMPRESSION: Left upper lobe opacity is noted concerning for pneumonia or atelectasis. Electronically Signed   By: Marijo Conception M.D.   On: 06/07/2021 12:13     EKG: I have personally reviewed.  Sinus rhythm, tachycardia, QTC 495, right axis deviation, right bundle blockade, early R wave progression  Assessment/Plan Principal Problem:   COPD exacerbation ([facility]) Active Problems:   Adenocarcinoma of left lung ([facility])   Severe sepsis ([facility])   Anxiety  Chronic respiratory failure with hypoxia ([facility])  Severe sepsis due to COPD exacerbation and chronic respiratory failure with hypoxia: Oxygen saturation 91-96% on home 4 L oxygen.  Patient has diffuse rhonchi, wheezing and coarse breathing sound bilaterally, clinically consistent with COPD exacerbation.  CT angiograms is negative for PE.  Patient meets criteria for severe sepsis with tachycardia with heart rate of 118, RR 37, elevated lactic acid at 2.9.  Patient had hypotension which responded to the IV fluid resuscitation.  Currently blood pressure 113/72.  Patient's BNP is normal at 17.0, but has bilateral lower leg edema.  Will get 2D echo for further evaluation.  -will admit to progressive unit as inpatient -Bronchodilators -2 g of magnesium sulfate   -Solu-Medrol 60 mg IV bid -Z pak  -Mucinex for cough  -Incentive spirometry -Follow up blood culture x2, sputum culture -Nasal cannula oxygen as needed to maintain O2 saturation 93% or greater -will get Procalcitonin and trend lactic acid levels per sepsis protocol. -IVF: 2.5L of NS bolus  -2D echo  Adenocarcinoma of left lung [facility]): Patient states that he is to follow-up with Dr. Grayland Ormond, planning to do chemotherapy on 9/15.  He could not finish PET scan today. -Message sent to Dr. Grayland Ormond for consultation  Anxiety -Continue home as needed Xanax    DVT ppx: sQ Lovenox Code Status: Full code Family Communication: Yes, patient's brother by phone  Disposition Plan: Number Anticipate discharge back to previous environment Consults called: Dr. Grayland Ormond of oncology Admission status and Level of care: Progressive Cardiac:    as inpt        Status is: Inpatient  Remains inpatient appropriate because:Inpatient level of care appropriate due to severity of illness  Dispo: The patient is from: Home              Anticipated d/c is to: Home              Patient currently is not medically stable to d/c.   Difficult to place patient No           Date of Service 06/07/2021    [facility]   If 7PM-7AM, please contact night-coverage [email or web address] 06/07/2021, 5:52 PM

## 2021-06-07 NOTE — Progress Notes (Signed)
Patient arrived to the floor at 1805. (Vitals were taken but did not crossover and had to be retaken at 1858). Pt alert and oriented x 4. Tachypnic, short of breath. Pt oriented to room, call light in hand and bed in lowest position. NS bolus currently running. Assisted pt with ordering dinner. All current needs met at this time.

## 2021-06-07 NOTE — ED Notes (Signed)
Admitting at bedside 

## 2021-06-07 NOTE — ED Triage Notes (Signed)
Pt here from [facility] where he was supposed to have a procedure done today but has been vomiting and has tachycardic since arrival. Pt is vomiting in triage.

## 2021-06-07 NOTE — ED Notes (Signed)
Patient requested to sit in chair in room instead of bed. Patient on cardiac monitor and pulse oximeter.

## 2021-06-07 NOTE — ED Notes (Signed)
Patients brother given update with verbal ok from patient.

## 2021-06-07 NOTE — Plan of Care (Signed)

## 2021-06-07 NOTE — ED Provider Notes (Signed)
[facility] Emergency Department Provider Note   ____________________________________________   Event Date/Time   First MD Initiated Contact with Patient 06/07/21 1121     (approximate)  I have reviewed the triage vital signs and the nursing notes.   HISTORY  Chief Complaint Tachycardia and Nausea  HPI Derek Clarke is a 65 y.o. male with the below medical history, including COPD and adenocarcinoma presents from intake from his PET scan. Patient was apparently experiencing coughing up phlegm and tachycardia. He endorses that he "can't get this junk out of my throat" while spitting up phlegm during the exam. He had a hospital admission last month and was discharged on home O2.  Past Medical History:  Diagnosis Date   Adenocarcinoma ([place])    lung;   COPD (chronic obstructive pulmonary disease) ([place])     Patient Active Problem List   Diagnosis Date Noted   Acute on chronic respiratory failure with hypoxia ([place]) 06/07/2021   COPD (chronic obstructive pulmonary disease) (H[facility])    Severe sepsis (H[facility])    Anxiety    COPD exacerbation ([place])    Adenocarcinoma of left lung ([place]) 05/26/2021   Hemoptysis 05/15/2021   Mass of left lung on CT 05/15/2021   Acute respiratory failure with hypoxia ([place]) 05/15/2021   Nicotine dependence 05/15/2021    Past Surgical History:  Procedure Laterality Date   IR IMAGING GUIDED PORT INSERTION  06/03/2021    Prior to Admission medications   Medication Sig Start Date End Date Taking? Authorizing Provider  albuterol (VENTOLIN HFA) 108 (90 Base) MCG/ACT inhaler Inhale 2 puffs into the lungs every 6 (six) hours as needed for wheezing or shortness of breath. 05/21/21   Lorella Nimrod, MD  ALPRAZolam Duanne Moron) 0.5 MG tablet Take 1 tablet (0.5 mg total) by mouth 2 (two) times daily as needed for anxiety. 05/22/21   Lorella Nimrod, MD  budesonide (PULMICORT) 180 MCG/ACT inhaler Inhale 1 puff into the lungs daily. 05/21/21   Lorella Nimrod, MD  feeding supplement (ENSURE ENLIVE / ENSURE PLUS) LIQD Take 237 mLs by mouth 2 (two) times daily between meals. 05/22/21   Lorella Nimrod, MD  guaiFENesin (MUCINEX) 600 MG 12 hr tablet Take 600 mg by mouth 2 (two) times daily as needed for cough or to loosen phlegm.    [provider]  ibuprofen (ADVIL) 400 MG tablet Take 400 mg by mouth every 6 (six) hours as needed.    [provider]  lidocaine-prilocaine (EMLA) cream Apply to affected area once 05/27/21   Lloyd Huger, MD  Multiple Vitamin (MULTIVITAMIN WITH MINERALS) TABS tablet Take 1 tablet by mouth daily. 05/22/21   Lorella Nimrod, MD  ondansetron (ZOFRAN) 8 MG tablet Take 1 tablet (8 mg total) by mouth 2 (two) times daily as needed for refractory nausea / vomiting. 05/27/21   Lloyd Huger, MD  prochlorperazine (COMPAZINE) 10 MG tablet Take 1 tablet (10 mg total) by mouth every 6 (six) hours as needed (Nausea or vomiting). 05/27/21   Lloyd Huger, MD    Allergies Patient has no known allergies.  History reviewed. No pertinent family history.  Social History Social History   Tobacco Use   Smoking status: Former    Packs/day: 1.00    Years: 30.00    Pack years: 30.00    Types: Cigarettes    Quit date: 03/26/2021    Years since quitting: 0.2   Smokeless tobacco: Never  Substance Use Topics   Alcohol use: Yes  Drug use: Yes    Types: Marijuana    Comment: quit approx. 5 years ago    Review of Systems  Constitutional: No fever/chills Eyes: No visual changes. ENT: No sore throat. Cardiovascular: Denies chest pain. Respiratory: Reports shortness of breath and productive cough Gastrointestinal: No abdominal pain.  No nausea, no vomiting.  No diarrhea.  No constipation. Genitourinary: Negative for dysuria. Musculoskeletal: Negative for back pain. Skin: Negative for rash. Neurological: Negative for headaches, focal weakness or  numbness. ____________________________________________   PHYSICAL EXAM:  VITAL SIGNS: ED Triage Vitals  Enc Vitals Group     BP 06/07/21 1045 126/90     Pulse Rate 06/07/21 1045 (!) 118     Resp 06/07/21 1045 20     Temp 06/07/21 1045 97.9 F (36.6 C)     Temp Source 06/07/21 1045 Oral     SpO2 06/07/21 1045 91 %     Weight 06/07/21 1045 229 lb 15 oz (104.3 kg)     Height 06/07/21 1045 5\' 9"  (1.753 m)     Head Circumference --      Peak Flow --      Pain Score 06/07/21 1044 0     Pain Loc --      Pain Edu? --      Excl. in [place]? --     Constitutional: Alert and oriented. Well appearing and in no acute distress. Eyes: Conjunctivae are normal. PERRL. EOMI. Head: Atraumatic. Nose: No congestion/rhinnorhea. Mouth/Throat: Mucous membranes are moist.  Oropharynx non-erythematous. Neck: No stridor.   Cardiovascular: Normal rate, regular rhythm. Grossly normal heart sounds.  Good peripheral circulation. Respiratory: Increased respiratory effort.  No retractions. Lungs with bilateral rhonchi and end-expiratory wheeze Gastrointestinal: Soft and nontender. No distention. No abdominal bruits. No CVA tenderness. Musculoskeletal: No lower extremity tenderness nor edema.  No joint effusions. Neurologic:  Normal speech and language. No gross focal neurologic deficits are appreciated. No gait instability. Skin:  Skin is warm, dry and intact. No rash noted. Psychiatric: Mood and affect are normal. Speech and behavior are normal.  ____________________________________________   LABS (all labs ordered are listed, but only abnormal results are displayed)  Labs Reviewed  COMPREHENSIVE METABOLIC PANEL - Abnormal; Notable for the following components:      Result Value   Chloride 92 (*)    Glucose, Bld 103 (*)    Albumin 3.3 (*)    All other components within normal limits  LACTIC ACID, PLASMA - Abnormal; Notable for the following components:   Lactic Acid, Venous 2.9 (*)    All other  components within normal limits  RESP PANEL BY RT-PCR (FLU A&B, COVID) ARPGX2  CULTURE, BLOOD (ROUTINE X 2)  CULTURE, BLOOD (ROUTINE X 2)  LIPASE, BLOOD  CBC  PROCALCITONIN  BRAIN NATRIURETIC PEPTIDE  LACTIC ACID, PLASMA  URINALYSIS, COMPLETE (UACMP) WITH MICROSCOPIC  LACTIC ACID, PLASMA  LACTIC ACID, PLASMA  PROTIME-INR  TROPONIN I (HIGH SENSITIVITY)   ____________________________________________  EKG  Vent. rate 123 BPM PR interval 126 ms QRS duration 138 ms QT/QTcB 346/495 ms P-R-T axes 60 116 RBBB No STEMI ____________________________________________  RADIOLOGY I, Melvenia Needles, personally viewed and evaluated these images (plain radiographs) as part of my medical decision making, as well as reviewing the written report by the radiologist.  ED MD interpretation:  agree with report  Official radiology report(s): CT Angio Chest PE W and/or Wo Contrast  Result Date: 06/07/2021 CLINICAL DATA:  Tachycardia, vomiting, metastatic lung cancer EXAM: CT ANGIOGRAPHY CHEST WITH  CONTRAST TECHNIQUE: Multidetector CT imaging of the chest was performed using the standard protocol during bolus administration of intravenous contrast. Multiplanar CT image reconstructions and MIPs were obtained to evaluate the vascular anatomy. CONTRAST:  51mL OMNIPAQUE IOHEXOL 350 MG/ML SOLN COMPARISON:  06/08/2011, 05/15/2021, FINDINGS: Cardiovascular: This is a technically adequate evaluation of the pulmonary vasculature. No filling defects or pulmonary emboli. Stable left hilar soft tissue mass encasing the left upper and left lower pulmonary arteries. Stable trace pericardial effusion. The heart is not enlarged. No evidence of thoracic aortic aneurysm or dissection. Mild atherosclerosis of the aorta and coronary vessels. Mediastinum/Nodes: There is segmental wall thickening of the mid to distal thoracic esophagus. Confluent soft tissue within the mediastinum and bilateral hila consistent with  adenopathy, not grossly changed since prior study. An enlarged left supraclavicular lymph node on image 9/4 measures 13 mm in short axis, unchanged. Thyroid and trachea are unremarkable. Lungs/Pleura: There are trace bilateral pleural effusions. Since the prior exam, there is progressive near complete consolidation of the left upper lobe, which may reflect progressive disease and/or postobstructive change. There is diffuse bilateral bronchial wall thickening, greatest at the lung bases. Continued right middle lobe collapse with occlusion of the right middle lobe bronchus again noted. Upper Abdomen: There are stable bilateral adrenal nodules, measuring up to 2.2 cm on the right. Multiple enlarged lymph nodes are again seen within the central upper abdomen, largest measuring 1.7 cm just posterior to the gastric cardia. Multiple hypodensities are again seen within the liver consistent with metastatic disease, grossly unchanged since prior study. Musculoskeletal: Stable sclerotic focus within the left T10 transverse process and within the T9 vertebral body, compatible with bony metastases. No acute displaced fractures. Reconstructed images demonstrate no additional findings. Review of the MIP images confirms the above findings. IMPRESSION: 1. No evidence of pulmonary embolus. 2. Persistent left hilar mass surrounding the left hilar vessels, with increasing near total consolidation of the left upper lobe since prior study. This may be progression of malignancy versus postobstructive change. 3. Continued coalescing lymphadenopathy throughout the mediastinum and bilateral hila. 4. Bilateral bronchial wall thickening, greatest at the lung bases. 5. Trace bilateral pleural effusions. 6. Segmental wall thickening of the mid to distal thoracic esophagus, which could be inflammatory or infectious. 7. Upper abdominal metastases with continued multiple liver lesions, bilateral adrenal nodularity, and upper abdominal adenopathy.  Electronically Signed   By: Randa Ngo M.D.   On: 06/07/2021 15:12   DG Chest Portable 1 View  Result Date: 06/07/2021 CLINICAL DATA:  Cough, shortness of breath. EXAM: PORTABLE CHEST 1 VIEW COMPARISON:  May 14, 2021. FINDINGS: Stable cardiomediastinal silhouette. Interval placement of right internal jugular Port-A-Cath with distal tip in expected position of the SVC. No pneumothorax is noted. Left upper lobe consolidation is noted concerning for pneumonia or atelectasis. Probable central pulmonary vascular congestion is noted. Bony thorax is unremarkable. IMPRESSION: Left upper lobe opacity is noted concerning for pneumonia or atelectasis. Electronically Signed   By: Marijo Conception M.D.   On: 06/07/2021 12:13    ____________________________________________   PROCEDURES  Procedure(s) performed (including Critical Care):  Procedures  DuoNeb x 2 NS 1000 bolus IVP Ativan 1 mg IVP ____________________________________________   INITIAL IMPRESSION / ASSESSMENT AND PLAN / ED COURSE  As part of my medical decision making, I reviewed the following data within the [place] reviewed as noted, EKG interpreted RBBB and no ST segment changes, Radiograph reviewed as noted, Discussed with admitting physician Dr. Blaine Hamper,  Evaluated by EM attending M. Jari Pigg, MD, and Notes from prior ED visits  Differential includes, but is not limited to, viral syndrome, bronchitis including COPD exacerbation, pneumonia, reactive airway disease including asthma, CHF including exacerbation with or without pulmonary/interstitial edema, pneumothorax, ACS, thoracic trauma, and pulmonary embolism.  Patient with ED evaluation of increased productive cough and shortness of breath, presented to the ED from patient radiology.  Patient with a history of adenocarcinoma with recent port placement for chemotherapy and radiation, presents with complaints of productive cough.  Evaluate was complaints, and found  initial x-ray which was concerning for a left upper lobe pneumonia.  His vital signs remained tachypneic, tachycardic, with normal O2 sats on nasal cannula.  His blood pressure began to soften, and a fluid bolus was provided.  Once stable, the patient was further evaluated with a CT chest angio.  It ruled out a left upper lobe pneumonia, but does confirm progression of his underlying pulmonary mass.  Patient at this time appears much more comfortable and is showing normalized respiratory rate and heart rate.  He does endorse improvement of symptoms.  Patient is stable and agreeable to admission at this time.  ____________________________________________   FINAL CLINICAL IMPRESSION(S) / ED DIAGNOSES  Final diagnoses:  COPD exacerbation [facility])     ED Discharge Orders     None        Note:  This document was prepared using Dragon voice recognition software and may include unintentional dictation errors.    Melvenia Needles, PA-C 06/07/21 1706    Vanessa Gadsden, MD 06/08/21 [phone]

## 2021-06-07 NOTE — Progress Notes (Signed)
PHARMACIST - PHYSICIAN COMMUNICATION  CONCERNING:  Enoxaparin (Lovenox) for DVT Prophylaxis    RECOMMENDATION: Patient was prescribed enoxaprin 40mg  q24 hours for VTE prophylaxis.   Filed Weights   06/07/21 1045  Weight: 104.3 kg (229 lb 15 oz)    Body mass index is 33.96 kg/m.  Estimated Creatinine Clearance: 111 mL/min (by C-G formula based on SCr of 0.7 mg/dL).   Based on [place] patient is candidate for enoxaparin 0.5mg /kg TBW SQ every 24 hours based on BMI being >30.  DESCRIPTION: Pharmacy has adjusted enoxaparin dose per [facility] policy.  Patient is now receiving enoxaparin 52.5 mg every 24 hours    Berta Minor, PharmD Clinical Pharmacist  06/07/2021 6:24 PM

## 2021-06-08 ENCOUNTER — Encounter: Payer: Self-pay | Admitting: Oncology

## 2021-06-08 ENCOUNTER — Inpatient Hospital Stay (HOSPITAL_COMMUNITY)
Admit: 2021-06-08 | Discharge: 2021-06-08 | Disposition: A | Discharge status: Home / Self Care | Payer: Self-pay | Attending: Internal Medicine | Admitting: Internal Medicine

## 2021-06-08 ENCOUNTER — Inpatient Hospital Stay
Admit: 2021-06-08 | Discharge: 2021-06-08 | Disposition: A | Discharge status: Home / Self Care | Payer: Self-pay | Attending: Internal Medicine | Admitting: Internal Medicine

## 2021-06-08 DIAGNOSIS — J441 Chronic obstructive pulmonary disease with (acute) exacerbation: Secondary | ICD-10-CM

## 2021-06-08 DIAGNOSIS — I959 Hypotension, unspecified: Secondary | ICD-10-CM

## 2021-06-08 DIAGNOSIS — J9611 Chronic respiratory failure with hypoxia: Secondary | ICD-10-CM

## 2021-06-08 DIAGNOSIS — I5031 Acute diastolic (congestive) heart failure: Secondary | ICD-10-CM

## 2021-06-08 DIAGNOSIS — Z515 Encounter for palliative care: Secondary | ICD-10-CM

## 2021-06-08 DIAGNOSIS — E872 Acidosis: Secondary | ICD-10-CM

## 2021-06-08 LAB — ECHOCARDIOGRAM COMPLETE
AR max vel: 2.39 cm2
AV Area VTI: 2.89 cm2
AV Area mean vel: 2.78 cm2
AV Mean grad: 6 mmHg
AV Peak grad: 10.5 mmHg
Ao pk vel: 1.62 m/s
Area-P 1/2: 3.95 cm2
Height: 69 in
S' Lateral: 2.6 cm
Weight: 3530.89 oz

## 2021-06-08 LAB — CBC
HCT: 40.8 % (ref 39.0–52.0)
Hemoglobin: 13.6 g/dL (ref 13.0–17.0)
MCH: 27.6 pg (ref 26.0–34.0)
MCHC: 33.3 g/dL (ref 30.0–36.0)
MCV: 82.8 fL (ref 80.0–100.0)
Platelets: 226 10*3/uL (ref 150–400)
RBC: 4.93 MIL/uL (ref 4.22–5.81)
RDW: 12.3 % (ref 11.5–15.5)
WBC: 11.8 10*3/uL — ABNORMAL HIGH (ref 4.0–10.5)
nRBC: 0 % (ref 0.0–0.2)

## 2021-06-08 LAB — MAGNESIUM: Magnesium: 2.4 mg/dL (ref 1.7–2.4)

## 2021-06-08 MED ORDER — IPRATROPIUM-ALBUTEROL 0.5-2.5 (3) MG/3ML IN SOLN
3.0000 mL | Freq: Four times a day (QID) | RESPIRATORY_TRACT | Status: DC
  Administered 2021-06-09 (×3): 3 mL via RESPIRATORY_TRACT
  Filled 2021-06-08 (×3): qty 3

## 2021-06-08 MED ORDER — IPRATROPIUM-ALBUTEROL 0.5-2.5 (3) MG/3ML IN SOLN
3.0000 mL | Freq: Four times a day (QID) | RESPIRATORY_TRACT | Status: DC
  Administered 2021-06-08 (×2): 3 mL via RESPIRATORY_TRACT
  Filled 2021-06-08 (×2): qty 3

## 2021-06-08 MED ORDER — IPRATROPIUM-ALBUTEROL 0.5-2.5 (3) MG/3ML IN SOLN: 3.0000 mL | Freq: Four times a day (QID) | RESPIRATORY_TRACT | Status: DC

## 2021-06-08 MED ORDER — CHLORHEXIDINE GLUCONATE CLOTH 2 % EX PADS
6.0000 | MEDICATED_PAD | Freq: Every day | CUTANEOUS | Status: DC
  Administered 2021-06-08 – 2021-06-09 (×2): 6 via TOPICAL

## 2021-06-08 NOTE — Progress Notes (Signed)
Mobility Specialist - Progress Note   06/08/21 1637  Mobility  Activity Refused mobility  Mobility performed by Mobility specialist    Pt sitting in recliner on arrival, politely declined mobility at this time--no reason specified. Will attempt session another date/time.    Kathee Delton Mobility Specialist 06/08/21, 4:37 PM

## 2021-06-08 NOTE — Evaluation (Signed)
Occupational Therapy Evaluation Patient Details Name: Derek Clarke MRN: [id number] DOB: 1956-09-22 Today's Date: 06/08/2021   History of Present Illness Pt is a 65 y.o. male presenting to hospital 06/07/21 with worsening shortness of breath. He was scheduled for PET scan but unable to complete. Referred to ED.  Pt admitted with Severe sepsis with COPD exacerbation. PMH significant: adenocarcinoma of R lung, chronic respiratory failure on 4L o2 at baseline, former smoker, anxiety;   Clinical Impression   Pt seen for OT evaluation this date in setting of acute hospitalization d/t COPD exacerbation. Pt with decreased fxl activity tolerance this date, but overall functional strength and ROM to complete ADL tasks and ADL mobility at his reported baseline. Pt generally requiring increased time or to perform tasks with modified techniques to conserve energy. OT educates re: energy conservation and task pacing with RPE. Pt with good reception. No further OT needs detected at this time.      Recommendations for follow up therapy are one component of a multi-disciplinary discharge planning process, led by the attending physician.  Recommendations may be updated based on patient status, additional functional criteria and insurance authorization.   Follow Up Recommendations  No OT follow up    Equipment Recommendations  None recommended by OT    Recommendations for Other Services       Precautions / Restrictions Precautions Precaution Comments: Metastatic disease Restrictions Weight Bearing Restrictions: No      Mobility Bed Mobility               General bed mobility comments: deferred (pt sitting in recliner beginning/end of session)    Transfers Overall transfer level: Independent Equipment used: None Transfers: Sit to/from Stand Sit to Stand: Independent         General transfer comment: increased time d/t increased O2 need    Balance Overall balance assessment:  Independent                                         ADL either performed or assessed with clinical judgement   ADL Overall ADL's : Modified independent                                       General ADL Comments: requires increased time and O2 tank which he does not at baseline     Vision Baseline Vision/History: 1 Wears glasses Wears Glasses: At all times Patient Visual Report: No change from baseline       Perception     Praxis      Pertinent Vitals/Pain Pain Assessment: 0-10 Pain Score: 3  Faces Pain Scale: No hurt Pain Location: left upper quadrant/chest Pain Descriptors / Indicators: Aching Pain Intervention(s): Limited activity within patient's tolerance;Monitored during session     Hand Dominance Right   Extremity/Trunk Assessment Upper Extremity Assessment Upper Extremity Assessment: Overall WFL for tasks assessed   Lower Extremity Assessment Lower Extremity Assessment: Overall WFL for tasks assessed       Communication Communication Communication: No difficulties   Cognition Arousal/Alertness: Awake/alert Behavior During Therapy: WFL for tasks assessed/performed Overall Cognitive Status: Within Functional Limits for tasks assessed  General Comments: patient short of breath requiring increased time to communicate/respond to question;   General Comments       Exercises Other Exercises Other Exercises: ducation with pt re: energy conservation considerations including sponge bathing, sitting for UB ADLs, modified cross-leg technique for LB dressing, task pacing using RPE sclae. Pt with good reception of education.   Shoulder Instructions      Home Living Family/patient expects to be discharged to:: Private residence Living Arrangements: Alone Available Help at Discharge: Family;Other (Comment) (brother in [place]) Type of Home: Mobile home Home Access: Stairs to  enter Technical brewer of Steps: 3 Entrance Stairs-Rails: Right;Left;Can reach both Home Layout: One level     Bathroom Shower/Tub: Teacher, early years/pre: Standard     Home Equipment: None          Prior Functioning/Environment Level of Independence: Independent (reports declining mobility in recent weeks since cancer dx)        Comments: (+) driving        OT Problem List: Cardiopulmonary status limiting activity      OT Treatment/Interventions: Self-care/ADL training;Therapeutic activities    OT Goals(Current goals can be found in the care plan section) Acute Rehab OT Goals Patient Stated Goal: "To improve breathing" OT Goal Formulation: All assessment and education complete, DC therapy  OT Frequency:     Barriers to D/C:            Co-evaluation              AM-PAC OT "6 Clicks" Daily Activity     Outcome Measure Help from another person eating meals?: None Help from another person taking care of personal grooming?: None Help from another person toileting, which includes using toliet, bedpan, or urinal?: None Help from another person bathing (including washing, rinsing, drying)?: None Help from another person to put on and taking off regular upper body clothing?: None Help from another person to put on and taking off regular lower body clothing?: None 6 Click Score: 24   End of Session Equipment Utilized During Treatment: Oxygen Nurse Communication: Mobility status  Activity Tolerance: Patient tolerated treatment well Patient left: Other (comment);with call bell/phone within reach;in chair  OT Visit Diagnosis: Muscle weakness (generalized) (M62.81)                Time: 0272-5366 OT Time Calculation (min): 15 min Charges:  OT General Charges $OT Visit: 1 Visit OT Evaluation $OT Eval Low Complexity: [place], MS, OTR/L ascom [phone] 06/08/21, 4:26 PM

## 2021-06-08 NOTE — Progress Notes (Signed)
*  PRELIMINARY RESULTS* Echocardiogram 2D Echocardiogram has been performed.  Sherrie Sport 06/08/2021, 2:14 PM

## 2021-06-08 NOTE — Consult Note (Signed)
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  INTERVAL HISTORY: Patient is a 65 year old male who was recently diagnosed with the above-stated malignancy, but has not yet started treatment.  He was recently admitted for what appears to be a COPD exacerbation and possibly community-acquired pneumonia.  He continues to have cough and shortness of breath, but admits he has improved since admission.  He continues to have chronic weakness and fatigue.  He has no neurologic complaints.  He denies any fevers.  He has a fair appetite, but denies weight loss.  He has no chest pain or hemoptysis.  He denies any nausea, vomiting, constipation, or diarrhea.  He has no urinary complaints.  Patient offers no further specific complaints today.  REVIEW OF SYSTEMS:   Review of Systems  Constitutional:  Positive for malaise/fatigue. Negative for fever and weight loss.  Respiratory:  Positive for cough and shortness of breath. Negative for hemoptysis.   Cardiovascular: Negative.  Negative for chest pain and leg swelling.  Gastrointestinal: Negative.  Negative for abdominal pain and nausea.  Genitourinary: Negative.  Negative for dysuria.  Musculoskeletal: Negative.  Negative for back pain.  Skin: Negative.  Negative for rash.  Neurological:  Positive for weakness. Negative for dizziness, focal weakness and headaches.  Psychiatric/Behavioral:  The patient is nervous/anxious.    As per HPI. Otherwise, a complete review of systems is negative.  PAST MEDICAL HISTORY: Past Medical History:  Diagnosis Date   Adenocarcinoma ([place])    lung;   COPD (chronic obstructive pulmonary disease) ([place])     PAST SURGICAL HISTORY: Past Surgical History:   Procedure Laterality Date   IR IMAGING GUIDED PORT INSERTION  06/03/2021    FAMILY HISTORY: Family History  Problem Relation Age of Onset   Lung disease Mother    Dementia Father     ADVANCED DIRECTIVES (Y/N):  '@ADVDIR' @  HEALTH MAINTENANCE: Social History   Tobacco Use   Smoking status: Former    Packs/day: 1.00    Years: 30.00    Pack years: 30.00    Types: Cigarettes    Quit date: 03/26/2021    Years since quitting: 0.2   Smokeless tobacco: Never  Substance Use Topics   Alcohol use: Yes   Drug use: Yes    Types: Marijuana    Comment: quit approx. 5 years ago     Colonoscopy:  PAP:  Bone density:  Lipid panel:  No Known Allergies  Current Facility-Administered Medications  Medication Dose Route Frequency Provider Last Rate Last Admin   acetaminophen (TYLENOL) tablet 650 mg  650 mg Oral Q6H PRN Ivor Costa, MD       albuterol (PROVENTIL) (2.5 MG/3ML) 0.083% nebulizer solution 2.5 mg  2.5 mg Nebulization Q4H PRN Ivor Costa, MD       ALPRAZolam Duanne Moron) tablet 0.5 mg  0.5 mg Oral BID PRN Ivor Costa, MD   0.5 mg at 06/08/21 0007   azithromycin (ZITHROMAX) tablet 250 mg  250 mg Oral Daily Ivor Costa, MD   250 mg at 06/08/21 0929   chlorhexidine (PERIDEX) 0.12 % solution 15 mL  15 mL Mouth Rinse BID Ivor Costa, MD   15 mL at 06/08/21 0929   Chlorhexidine Gluconate Cloth 2 % PADS 6 each  6 each  Topical Daily Mercy Riding, MD   6 each at 06/08/21 0929   dextromethorphan-guaiFENesin ([place] DM) 30-600 MG per 12 hr tablet 1 tablet  1 tablet Oral BID PRN Ivor Costa, MD   1 tablet at 06/08/21 1214   diphenhydrAMINE (BENADRYL) tablet 25 mg  25 mg Oral Q6H Ivor Costa, MD   25 mg at 06/08/21 0929   enoxaparin (LOVENOX) injection 52.5 mg  0.5 mg/kg Subcutaneous Q24H Ivor Costa, MD   52.5 mg at 06/07/21 2129   feeding supplement (ENSURE ENLIVE / ENSURE PLUS) liquid 237 mL  237 mL Oral BID BM Ivor Costa, MD   237 mL at 06/08/21 0929   ibuprofen (ADVIL) tablet 400 mg  400 mg Oral Q6H  PRN Ivor Costa, MD       ipratropium-albuterol (DUONEB) 0.5-2.5 (3) MG/3ML nebulizer solution 3 mL  3 mL Nebulization Q4H Ivor Costa, MD   3 mL at 06/08/21 1129   MEDLINE mouth rinse  15 mL Mouth Rinse q12n4p Ivor Costa, MD       methylPREDNISolone sodium succinate (SOLU-MEDROL) 125 mg/2 mL injection 60 mg  60 mg Intravenous Q12H Ivor Costa, MD   60 mg at 06/08/21 0525   multivitamin with minerals tablet 1 tablet  1 tablet Oral Daily Ivor Costa, MD   1 tablet at 06/08/21 0929   ondansetron (ZOFRAN) injection 4 mg  4 mg Intravenous Q8H PRN Ivor Costa, MD   4 mg at 06/07/21 1952    OBJECTIVE: Vitals:   06/08/21 1131 06/08/21 1147  BP:  130/81  Pulse:  (!) 108  Resp:  17  Temp:  98.7 F (37.1 C)  SpO2: 96% 96%     Body mass index is 32.59 kg/m.    ECOG FS:2 - Symptomatic, <50% confined to bed  General: Well-developed, well-nourished, no acute distress. Eyes: Pink conjunctiva, anicteric sclera. HEENT: Normocephalic, moist mucous membranes. Lungs: No audible wheezing or coughing. Heart: Regular rate and rhythm. Abdomen: Soft, nontender, no obvious distention. Musculoskeletal: No edema, cyanosis, or clubbing. Neuro: Alert, answering all questions appropriately. Cranial nerves grossly intact. Skin: No rashes or petechiae noted. Psych: Normal affect. Lymphatics: No cervical, calvicular, axillary or inguinal LAD.   LAB RESULTS:  Lab Results  Component Value Date   NA 135 06/07/2021   K 4.3 06/07/2021   CL 92 (L) 06/07/2021   CO2 28 06/07/2021   GLUCOSE 103 (H) 06/07/2021   BUN 18 06/07/2021   CREATININE 0.70 06/07/2021   CALCIUM 9.6 06/07/2021   PROT 7.2 06/07/2021   ALBUMIN 3.3 (L) 06/07/2021   AST 25 06/07/2021   ALT 19 06/07/2021   ALKPHOS 109 06/07/2021   BILITOT 1.1 06/07/2021   GFRNONAA >60 06/07/2021    Lab Results  Component Value Date   WBC 11.8 (H) 06/08/2021   NEUTROABS 8.4 (H) 05/14/2021   HGB 13.6 06/08/2021   HCT 40.8 06/08/2021   MCV 82.8 06/08/2021    PLT 226 06/08/2021     STUDIES: DG Chest 1 View  Result Date: 05/14/2021 CLINICAL DATA:  Shortness of breath for couple of months. EXAM: CHEST  1 VIEW COMPARISON:  None. FINDINGS: Mild cardiac enlargement. Perihilar infiltrates bilaterally may represent edema or multifocal pneumonia. No pleural effusions. No pneumothorax. Mediastinal contours appear intact. IMPRESSION: Bilateral perihilar infiltrates. Electronically Signed   By: Lucienne Capers M.D.   On: 05/14/2021 23:11   CT Angio Chest PE W and/or Wo Contrast  Result Date: 06/07/2021 CLINICAL DATA:  Tachycardia, vomiting, metastatic lung cancer EXAM:  CT ANGIOGRAPHY CHEST WITH CONTRAST TECHNIQUE: Multidetector CT imaging of the chest was performed using the standard protocol during bolus administration of intravenous contrast. Multiplanar CT image reconstructions and MIPs were obtained to evaluate the vascular anatomy. CONTRAST:  20m OMNIPAQUE IOHEXOL 350 MG/ML SOLN COMPARISON:  06/08/2011, 05/15/2021, FINDINGS: Cardiovascular: This is a technically adequate evaluation of the pulmonary vasculature. No filling defects or pulmonary emboli. Stable left hilar soft tissue mass encasing the left upper and left lower pulmonary arteries. Stable trace pericardial effusion. The heart is not enlarged. No evidence of thoracic aortic aneurysm or dissection. Mild atherosclerosis of the aorta and coronary vessels. Mediastinum/Nodes: There is segmental wall thickening of the mid to distal thoracic esophagus. Confluent soft tissue within the mediastinum and bilateral hila consistent with adenopathy, not grossly changed since prior study. An enlarged left supraclavicular lymph node on image 9/4 measures 13 mm in short axis, unchanged. Thyroid and trachea are unremarkable. Lungs/Pleura: There are trace bilateral pleural effusions. Since the prior exam, there is progressive near complete consolidation of the left upper lobe, which may reflect progressive disease  and/or postobstructive change. There is diffuse bilateral bronchial wall thickening, greatest at the lung bases. Continued right middle lobe collapse with occlusion of the right middle lobe bronchus again noted. Upper Abdomen: There are stable bilateral adrenal nodules, measuring up to 2.2 cm on the right. Multiple enlarged lymph nodes are again seen within the central upper abdomen, largest measuring 1.7 cm just posterior to the gastric cardia. Multiple hypodensities are again seen within the liver consistent with metastatic disease, grossly unchanged since prior study. Musculoskeletal: Stable sclerotic focus within the left T10 transverse process and within the T9 vertebral body, compatible with bony metastases. No acute displaced fractures. Reconstructed images demonstrate no additional findings. Review of the MIP images confirms the above findings. IMPRESSION: 1. No evidence of pulmonary embolus. 2. Persistent left hilar mass surrounding the left hilar vessels, with increasing near total consolidation of the left upper lobe since prior study. This may be progression of malignancy versus postobstructive change. 3. Continued coalescing lymphadenopathy throughout the mediastinum and bilateral hila. 4. Bilateral bronchial wall thickening, greatest at the lung bases. 5. Trace bilateral pleural effusions. 6. Segmental wall thickening of the mid to distal thoracic esophagus, which could be inflammatory or infectious. 7. Upper abdominal metastases with continued multiple liver lesions, bilateral adrenal nodularity, and upper abdominal adenopathy. Electronically Signed   By: MRanda NgoM.D.   On: 06/07/2021 15:12   CT Angio Chest PE W/Cm &/Or Wo Cm  Result Date: 05/15/2021 CLINICAL DATA:  Hemoptysis EXAM: CT ANGIOGRAPHY CHEST WITH CONTRAST TECHNIQUE: Multidetector CT imaging of the chest was performed using the standard protocol during bolus administration of intravenous contrast. Multiplanar CT image  reconstructions and MIPs were obtained to evaluate the vascular anatomy. CONTRAST:  1036mOMNIPAQUE IOHEXOL 350 MG/ML SOLN COMPARISON:  None. FINDINGS: Cardiovascular: Contrast injection is sufficient to demonstrate satisfactory opacification of the pulmonary arteries to the segmental level. There is no pulmonary embolus or evidence of right heart strain. The size of the main pulmonary artery is normal. Proximal left pulmonary arteries are severely narrowed by confluent soft tissue surrounding the left hilum. The course and caliber of the aorta are normal. There is atherosclerotic calcification. Opacification decreased due to pulmonary arterial phase contrast bolus timing. There is a small pericardial effusion. Mediastinum/Nodes: There is bulky abnormal mediastinal soft tissue with in case mint of the left common carotid artery and proximal left subclavian artery. There are multiple enlarged mediastinal  lymph nodes. Level 4 are node measures 10 mm. Lungs/Pleura: There is confluent soft tissue at the left hilum encasing the main left pulmonary artery and severely narrowing the proximal lobar branches. Abnormal soft tissue extends in a peribronchial pattern. There is also abnormal soft tissue at the right hilum. There is atelectasis within the right middle lobe and an area of consolidation in the lingula. Small pleural effusions. Upper Abdomen: Contrast bolus timing is not optimized for evaluation of the abdominal organs. There is an intermediate density 2.0 x 1.3 cm right adrenal nodule. There is a lower density left adrenal nodule measuring 1.5 cm. Musculoskeletal: No chest wall abnormality. No bony spinal canal stenosis. Review of the MIP images confirms the above findings. IMPRESSION: 1. No pulmonary embolus or acute aortic syndrome. 2. Bulky abnormal soft tissue at the left hilum encasing the main left pulmonary artery and severely narrowing the proximal lobar branches, consistent with malignancy. 3. Multiple  enlarged mediastinal lymph nodes, consistent with lymphatic metastatic disease. 4. Bilateral adrenal nodules, measuring up to 2.0 x 1.3 cm. The right adrenal nodule is concerning for metastatic disease. The left may be an adenoma. 5. Small pericardial effusion and small pleural effusions. 6. Consolidation in the lingula may be postobstructive. Aortic atherosclerosis (ICD10-I70.0). Electronically Signed   By: Ulyses Jarred M.D.   On: 05/15/2021 01:57   MR BRAIN W WO CONTRAST  Result Date: 05/18/2021 CLINICAL DATA:  Lung cancer diagnosis.  Staging. EXAM: MRI HEAD WITHOUT AND WITH CONTRAST TECHNIQUE: Multiplanar, multiecho pulse sequences of the brain and surrounding structures were obtained without and with intravenous contrast. CONTRAST:  58m GADAVIST GADOBUTROL 1 MMOL/ML IV SOLN COMPARISON:  None. FINDINGS: Brain: Study suffers from motion degradation. Diffusion imaging does not show any acute or subacute infarction or other cause of restricted diffusion. No evidence of chronic small vessel disease or old cortical infarction. Very few punctate foci of T2 and FLAIR signal within the white matter. No sign of mass lesion, hemorrhage, hydrocephalus or extra-axial collection. No abnormal contrast enhancement occurs. Vascular: Major vessels at the base of the brain show flow. Skull and upper cervical spine: Negative Sinuses/Orbits: Clear/normal Other: None IMPRESSION: No evidence of regional metastatic disease. Sensitivity is slightly limited by motion degradation and utilization of fast scanning protocol. I do not see any suspicious findings. Electronically Signed   By: MNelson ChimesM.D.   On: 05/18/2021 13:44   CT ABDOMEN PELVIS W CONTRAST  Result Date: 05/15/2021 CLINICAL DATA:  Abnormal left hilar soft tissue noted on chest CTA. This was concerning for malignancy. Additional evaluation to assess for possible metastatic disease. EXAM: CT ABDOMEN AND PELVIS WITH CONTRAST TECHNIQUE: Multidetector CT imaging of  the abdomen and pelvis was performed using the standard protocol following bolus administration of intravenous contrast. CONTRAST:  852mOMNIPAQUE IOHEXOL 350 MG/ML SOLN COMPARISON:  Current chest CTA. FINDINGS: Lower chest: Small right effusion. Left lung base subcentimeter nodules. Small patchy areas of ground-glass opacity at the left lung base. Pericardial effusion. No change from the earlier chest CTA. Hepatobiliary: Subtle liver lesions suspicious for metastatic disease. Largest lies in the right lobe, segment 6, 1.4 cm in size. Liver normal in size and overall attenuation. Mildly distended gallbladder. Increased attenuation material in the dependent gallbladder possibly small stones or sludge. No wall thickening or inflammation. No bile duct dilation. Pancreas: Unremarkable. No pancreatic ductal dilatation or surrounding inflammatory changes. Spleen: Normal in size without focal abnormality. Adrenals/Urinary Tract: Bilateral adrenal masses, right 2.1 x 1.4 cm, left 1.1 cm  size. Symmetric renal enhancement and excretion. No renal masses, stones or hydronephrosis. Normal ureters. Bladder minimally distended. Wall appears thickened. No bladder mass. Stomach/Bowel: Normal stomach. Small bowel and colon are normal in caliber. No wall thickening. No inflammation. Normal appendix suggested. No evidence of appendicitis. Vascular/Lymphatic: Mild aortic atherosclerosis.  No aneurysm. Prominent lymph nodes above the celiac axis, largest 1.2 cm in short axis. Prominent but subcentimeter retroperitoneal lymph nodes. No other enlarged nodes. Reproductive: Significant prostate enlargement elevating the bladder base. Prostate measures 6.9 cm superior to inferior by 5.8 x 5.6 cm transversely. Other: No abdominal wall hernia or abnormality. No abdominopelvic ascites. Musculoskeletal: Small area of sclerosis in the right ilium, likely a bone island. More definitive bone island noted in the anterior left femoral head. Vague  sclerotic lesion along the lower endplate of T9. No osteolytic lesions. No fracture or acute finding. IMPRESSION: 1. Subtle low-density liver lesions consistent with metastatic disease. 2. Bilateral adrenal masses suspicious for metastatic disease. 3. Prominent lymph nodes above the celiac axis suspicious for metastatic disease. 4. Subtle areas of sclerosis, right ilium and lower endplate of T9, nonspecific, possibly metastatic disease. 5. No acute findings. Electronically Signed   By: Lajean Manes M.D.   On: 05/15/2021 17:56   MR LIVER W WO CONTRAST  Result Date: 05/18/2021 CLINICAL DATA:  Inpatient. Acute respiratory failure with infiltrative left upper lobe soft tissue and bilateral mediastinal adenopathy suspicious on chest CT for lung malignancy. Indeterminate liver and bilateral adrenal lesions on CT abdomen study. EXAM: MRI ABDOMEN WITHOUT AND WITH CONTRAST TECHNIQUE: Multiplanar multisequence MR imaging of the abdomen was performed both before and after the administration of intravenous contrast. CONTRAST:  39m GADAVIST GADOBUTROL 1 MMOL/ML IV SOLN COMPARISON:  05/15/2021 CT abdomen/pelvis and chest CT angiogram. FINDINGS: Substantially motion degraded exam, limiting assessment. Lower chest: Small right and trace left dependent pleural effusions. Geographic consolidation in the visualized lingula and right middle lobe. Hepatobiliary: Normal liver size and configuration. No hepatic steatosis. There are several (at least 5) mildly T2 hyperintense T1 hypointense hypoenhancing liver masses scattered throughout the liver with restricted diffusion, compatible with metastatic disease, largest 1.5 x 1.3 cm in the segment 6 right liver (series 4/image 20), 1.3 x 1.1 cm in the anterior segment 4 left liver (series 4/image 18) and 1.1 x 1.1 cm in the segment 8 right liver (series 4/image 9). Normal gallbladder with no cholelithiasis. No biliary ductal dilatation. Common bile duct diameter 4 mm. No  choledocholithiasis. No biliary masses, strictures or beading. Pancreas: No pancreatic mass or duct dilation.  No pancreas divisum. Spleen: Normal size. No mass. Adrenals/Urinary Tract: Enhancing right adrenal 2.5 x 1.7 cm nodule (series 3/image 18) demonstrates no significant loss of signal intensity on chemical shift imaging and is indeterminate. Similar enhancing left adrenal 1.5 x 1.3 cm nodule (series 3/image 23), too small to characterize for intralesional lipid on the motion degraded chemical shift images. No hydronephrosis. Scattered bilateral simple renal cysts, largest 1.6 cm in the anterior upper left kidney. T2 hypointense T1 hyperintense 1.1 cm renal cortical lesion in the lower right kidney (series 4/image 33) without compelling enhancement on the motion degraded postcontrast sequences, compatible with hemorrhagic/proteinaceous Bosniak category 2 renal cyst. No overtly suspicious renal masses. Stomach/Bowel: Normal non-distended stomach. Visualized small and large bowel is normal caliber, with no bowel wall thickening. Vascular/Lymphatic: Normal caliber abdominal aorta. Patent portal, splenic, hepatic and renal veins. A few mildly enlarged left para-aortic nodes, largest 1.2 cm short axis diameter (series 18/image 66). Enlarged  1.5 cm high left retroperitoneal node anterior to the left diaphragmatic crus (series 18/image 37). Other: No abdominal ascites or focal fluid collection. Musculoskeletal: Enhancing inferior T9 vertebral 2.8 cm and right T10 vertebral 1.1 cm bone lesions (series 17/image 19). Enhancing 2.1 cm medial right iliac bone lesion (series 17/image 19). IMPRESSION: 1. Limited motion degraded scan. 2. Several (at least 5) hypoenhancing liver masses scattered throughout the liver, with MRI features compatible with metastatic disease. Percutaneous biopsy of the 1.5 cm segment 6 right liver or 1.3 cm anterior segment 4 left liver lesions could be considered. 3. Indeterminate enhancing  bilateral adrenal nodules, measuring 2.5 cm on the right and 1.5 cm on the left. Metastatic disease is not excluded. 4. Left para-aortic and high left retroperitoneal lymphadenopathy suspicious for metastatic disease. 5. Enhancing T9 and T10 vertebral and medial right iliac bone lesions, suspicious for bone metastases. 6. Small right and trace left dependent pleural effusions. Geographic consolidation in the visualized lingula and right middle lobe, nonspecific, please see recent chest CT angiogram study for details. Electronically Signed   By: Ilona Sorrel M.D.   On: 05/18/2021 16:39   DG Chest Portable 1 View  Result Date: 06/07/2021 CLINICAL DATA:  Cough, shortness of breath. EXAM: PORTABLE CHEST 1 VIEW COMPARISON:  May 14, 2021. FINDINGS: Stable cardiomediastinal silhouette. Interval placement of right internal jugular Port-A-Cath with distal tip in expected position of the SVC. No pneumothorax is noted. Left upper lobe consolidation is noted concerning for pneumonia or atelectasis. Probable central pulmonary vascular congestion is noted. Bony thorax is unremarkable. IMPRESSION: Left upper lobe opacity is noted concerning for pneumonia or atelectasis. Electronically Signed   By: Marijo Conception M.D.   On: 06/07/2021 12:13   [place] CORE BIOPSY (LYMPH NODES)  Result Date: 05/20/2021 INDICATION: Possible lung carcinoma by prior imaging with vague liver lesions and also enlarged left supraclavicular lymph node by CT. Due to oxygen requirement and poor respiratory status, biopsy of the left supraclavicular lymph node is performed without sedation as tolerance a liver biopsy currently with sedation would be borderline and potentially high risk. EXAM: ULTRASOUND GUIDED CORE BIOPSY OF LEFT SUPRACLAVICULAR LYMPH NODE MEDICATIONS: None. ANESTHESIA/SEDATION: None PROCEDURE: The procedure, risks, benefits, and alternatives were explained to the patient. Questions regarding the procedure were encouraged and answered.  The patient understands and consents to the procedure. A time-out was performed prior to initiating the procedure. The left neck was prepped with chlorhexidine in a sterile fashion, and a sterile drape was applied covering the operative field. A sterile gown and sterile gloves were used for the procedure. Local anesthesia was provided with 1% Lidocaine. After localizing an enlarged left supraclavicular lymph node, an 18 gauge core biopsy device was utilized in obtaining 4 separate core biopsy samples. Material was submitted in formalin. Additional ultrasound was performed. COMPLICATIONS: None immediate. FINDINGS: Several left supraclavicular and lower cervical lymph nodes are identified. The largest measures approximately 1.7 x 1.3 x 1.4 cm by ultrasound. Solid core biopsy samples were obtained from this lymph node. IMPRESSION: Ultrasound-guided core biopsy performed enlarged left supraclavicular lymph node. Electronically Signed   By: Aletta Edouard M.D.   On: 05/20/2021 11:02   IR IMAGING GUIDED PORT INSERTION  Result Date: 06/03/2021 INDICATION: Metastatic lung cancer.  Chemotherapy. EXAM: IMPLANTED PORT A CATH PLACEMENT WITH ULTRASOUND AND FLUOROSCOPIC GUIDANCE MEDICATIONS: None ANESTHESIA/SEDATION: Moderate (conscious) sedation was employed during this procedure. A total of Versed 0 mg and Fentanyl 25 mcg was administered intravenously. Moderate Sedation Time: 20 minutes.  The patient's level of consciousness and vital signs were monitored continuously by radiology nursing throughout the procedure under my direct supervision. FLUOROSCOPY TIME:  0 minutes, 24 seconds (4.5 mGy) COMPLICATIONS: None immediate. PROCEDURE: The procedure, risks, benefits, and alternatives were explained to the patient. Questions regarding the procedure were encouraged and answered. The patient understands and consents to the procedure. The neck and chest were prepped with chlorhexidine in a sterile fashion, and a sterile drape was  applied covering the operative field. Maximum barrier sterile technique with sterile gowns and gloves were used for the procedure. A timeout was performed prior to the initiation of the procedure. Local anesthesia was provided with 1% lidocaine with epinephrine. After creating a small venotomy incision, a micropuncture kit was utilized to access the internal jugular vein under direct, real-time ultrasound guidance. Ultrasound image documentation was performed. The microwire was kinked to measure appropriate catheter length. A subcutaneous port pocket was then created along the upper chest wall utilizing a combination of sharp and blunt dissection. The pocket was irrigated with sterile saline. A single lumen ISP power injectable port was chosen for placement. The 8 Fr catheter was tunneled from the port pocket site to the venotomy incision. The port was placed in the pocket. The external catheter was trimmed to appropriate length. At the venotomy, an 8 Fr peel-away sheath was placed over a guidewire under fluoroscopic guidance. The catheter was then placed through the sheath and the sheath was removed. Final catheter positioning was confirmed and documented with a fluoroscopic spot radiograph. The port was accessed with a Huber needle, aspirated and flushed with heparinized saline. The port pocket incision was closed with interrupted 3-0 Vicryl suture then Dermabond was applied, including at the venotomy incision. Dressings were placed. The patient tolerated the procedure well without immediate post procedural complication. IMPRESSION: Successful placement of a right internal jugular approach power injectable Port-A-Cath. The catheter is ready for immediate use. Michaelle Birks, MD [facility] Radiology Electronically Signed   By: Michaelle Birks M.D.   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  PLAN:    Stage IVb  adenocarcinoma of the lung with liver metastasis: Initial plan was to have a PET scan earlier this week to complete the staging work-up and start chemotherapy with carboplatinum, Taxol, and Avastin on Thursday, June 09, 2021.  Given patient's recent COPD exacerbation, will delay treatment approximately 1 week.  Ideally, PET scan will be rescheduled prior to initiating treatment.  No further intervention is needed from an oncology standpoint.  Appreciate palliative care input. Shortness of breath/COPD exacerbation: Patient requires approximately 4 L nasal cannula at baseline.  Continue steroids and current antibiotics.  Cardiac echo was completed today.  Results are pending at time of dictation. Leukocytosis: Likely reactive, monitor. Disposition: Okay from oncology standpoint to discharge when patient's symptoms have improved.  Follow-up in the cancer center next week for consideration of treatment.  Appreciate consult, will follow.  Derek Huger, MD   06/08/2021 2:25 PM

## 2021-06-08 NOTE — Progress Notes (Signed)
PROGRESS NOTE  Derek Clarke OVZ:[id number] DOB: [birth year]   PCP: Pcp, No  Patient is from: Home.  Lives alone.  Independent for ADL's.  Uses walker at baseline.  DOA: 06/07/2021 LOS: 1  Chief complaints:  Chief Complaint  Patient presents with   Tachycardia   Nausea     Brief Narrative / Interim history: 65 year old M with PMH of COPD/chronic hypoxic RF on 4 L, stage IV lung cancer with liver mets, former smoker and anxiety presenting with progressive shortness of breath, productive cough with greenish phlegm and brief right upper chest pain, and admitted for COPD exacerbation.  CTA chest negative for PE but persistent left healer mass surrounding the left hilar vessels, with increasing near-total consolidation of RUL suggesting progression of malignancy versus postobstructive change, continued coalescing LAD throughout the mediastinum and bilateral hila and bilateral bronchial wall thickening.  Patient was also hypotensive.  Patient was started on COPD regimen with systemic steroid, azithromycin and breathing treatments.  Oncology consulted.  Subjective: Seen and examined earlier this morning.  No major events overnight of this morning.  He says he feels better from breathing standpoint.  Cough has improved as well.  However, he barely speaks in full sentence.  No chest pain.  No dizziness.  Denies GI or UTI symptoms.  He is not a great historian either.  Objective: Vitals:   06/08/21 0749 06/08/21 1055 06/08/21 1131 06/08/21 1147  BP:    130/81  Pulse:    (!) 108  Resp:    17  Temp:    98.7 F (37.1 C)  TempSrc:      SpO2: 97% 94% 96% 96%  Weight:      Height:        Intake/Output Summary (Last 24 hours) at 06/08/2021 1505 Last data filed at 06/08/2021 1408 Gross per 24 hour  Intake 880 ml  Output --  Net 880 ml   Filed Weights   06/07/21 1045 06/08/21 0525  Weight: 104.3 kg 100.1 kg    Examination:  GENERAL: No apparent distress.  Nontoxic. HEENT: MMM.  Vision  and hearing grossly intact.  NECK: Supple.  No apparent JVD.  RESP: 97% on 4 L.  Some work of breathing as he speaks.  Fair aeration bilaterally.  Rhonchi bilaterally. CVS:  RRR. Heart sounds normal.  ABD/GI/GU: BS+. Abd soft, NTND.  MSK/EXT:  Moves extremities. No apparent deformity.  Trace bilateral edema SKIN: no apparent skin lesion or wound NEURO: Awake, alert and oriented fairly.  No apparent focal neuro deficit. PSYCH: Calm. Normal affect.   Procedures:  None  Microbiology summarized: AJOIN-86 and influenza PCR nonreactive. Blood cultures NGTD.  Assessment & Plan: Severe sepsis due to COPD exacerbation and chronic respiratory failure with hypoxia: POA: Some concern about bacterial infection contributing to COPD exacerbation.  He was tachycardic, tachypneic with hypotension and lactic acidosis on presentation.  CTA chest negative for PE but bronchial wall thickening and changes related to lung cancer as above.  No cardinal symptoms of CHF.  BNP within normal.  Blood cultures NGTD. -COPD pathway with systemic steroid, Z-Pak, bronchodilators, antitussive, mucolytic's, incentive spirometry -Follow echocardiogram-although low suspicion for CHF   Stage IV lung cancer with liver mets:  -Oncology to delay PET scan and chemotherapy for 1 week given his COPD exacerbation   Hypotension: Resolved after NS bolus.  Lactic acidosis: Resolved.   Anxiety -Continue home as needed Xanax   Class I obesity Body mass index is 32.59 kg/m.  DVT prophylaxis:  On subcu Lovenox  Code Status: Full code Family Communication: Patient and/or RN. Available if any question.  Level of care: Progressive Cardiac Status is: Inpatient  Remains inpatient appropriate because:IV treatments appropriate due to intensity of illness or inability to take PO and Inpatient level of care appropriate due to severity of illness  Dispo: The patient is from: Home              Anticipated d/c is to: Home               Patient currently is not medically stable to d/c.   Difficult to place patient No       Consultants:  Oncology Palliative medicine   Sch Meds:  Scheduled Meds:  azithromycin  250 mg Oral Daily   chlorhexidine  15 mL Mouth Rinse BID   Chlorhexidine Gluconate Cloth  6 each Topical Daily   diphenhydrAMINE  25 mg Oral Q6H   enoxaparin (LOVENOX) injection  0.5 mg/kg Subcutaneous Q24H   feeding supplement  237 mL Oral BID BM   ipratropium-albuterol  3 mL Nebulization Q6H   mouth rinse  15 mL Mouth Rinse q12n4p   methylPREDNISolone (SOLU-MEDROL) injection  60 mg Intravenous Q12H   multivitamin with minerals  1 tablet Oral Daily   Continuous Infusions: PRN Meds:.acetaminophen, albuterol, ALPRAZolam, dextromethorphan-guaiFENesin, ibuprofen, ondansetron (ZOFRAN) IV  Antimicrobials: Anti-infectives (From admission, onward)    Start     Dose/Rate Route Frequency Ordered Stop   06/08/21 1000  azithromycin (ZITHROMAX) tablet 250 mg       See Hyperspace for full Linked Orders Report.   250 mg Oral Daily 06/07/21 1738 06/12/21 0959   06/07/21 1800  azithromycin (ZITHROMAX) tablet 500 mg       See Hyperspace for full Linked Orders Report.   500 mg Oral Daily 06/07/21 1738 06/07/21 1836        I have personally reviewed the following labs and images: CBC: Recent Labs  Lab 06/07/21 1050 06/08/21 0728  WBC 10.1 11.8*  HGB 15.0 13.6  HCT 44.4 40.8  MCV 83.1 82.8  PLT 259 226   BMP &GFR Recent Labs  Lab 06/07/21 1050  NA 135  K 4.3  CL 92*  CO2 28  GLUCOSE 103*  BUN 18  CREATININE 0.70  CALCIUM 9.6   Estimated Creatinine Clearance: 108.9 mL/min (by C-G formula based on SCr of 0.7 mg/dL). Liver & Pancreas: Recent Labs  Lab 06/07/21 1050  AST 25  ALT 19  ALKPHOS 109  BILITOT 1.1  PROT 7.2  ALBUMIN 3.3*   Recent Labs  Lab 06/07/21 1050  LIPASE 32   No results for input(s): AMMONIA in the last 168 hours. Diabetic: No results for input(s):  HGBA1C in the last 72 hours. No results for input(s): GLUCAP in the last 168 hours. Cardiac Enzymes: No results for input(s): CKTOTAL, CKMB, CKMBINDEX, TROPONINI in the last 168 hours. No results for input(s): PROBNP in the last 8760 hours. Coagulation Profile: Recent Labs  Lab 06/07/21 1809  INR 1.0   Thyroid Function Tests: No results for input(s): TSH, T4TOTAL, FREET4, T3FREE, THYROIDAB in the last 72 hours. Lipid Profile: No results for input(s): CHOL, HDL, LDLCALC, TRIG, CHOLHDL, LDLDIRECT in the last 72 hours. Anemia Panel: No results for input(s): VITAMINB12, FOLATE, FERRITIN, TIBC, IRON, RETICCTPCT in the last 72 hours. Urine analysis: No results found for: COLORURINE, APPEARANCEUR, LABSPEC, PHURINE, GLUCOSEU, HGBUR, BILIRUBINUR, KETONESUR, PROTEINUR, UROBILINOGEN, NITRITE, LEUKOCYTESUR Sepsis Labs: Invalid input(s): PROCALCITONIN, LACTICIDVEN  Microbiology: Recent Results (from the past 240 hour(s))  Resp Panel by RT-PCR (Flu A&B, Covid) Nasopharyngeal Swab     Status: None   Collection Time: 06/07/21 11:21 AM   Specimen: Nasopharyngeal Swab; Nasopharyngeal(NP) swabs in vial transport medium  Result Value Ref Range Status   SARS Coronavirus 2 by RT PCR NEGATIVE NEGATIVE Final    Comment: (NOTE) SARS-CoV-2 target nucleic acids are NOT DETECTED.  The SARS-CoV-2 RNA is generally detectable in upper respiratory specimens during the acute phase of infection. The lowest concentration of SARS-CoV-2 viral copies this assay can detect is 138 copies/mL. A negative result does not preclude SARS-Cov-2 infection and should not be used as the sole basis for treatment or other patient management decisions. A negative result may occur with  improper specimen collection/handling, submission of specimen other than nasopharyngeal swab, presence of viral mutation(s) within the areas targeted by this assay, and inadequate number of viral copies(<138 copies/mL). A negative result must be  combined with clinical observations, patient history, and epidemiological information. The expected result is Negative.  Fact Sheet for Patients:  EntrepreneurPulse.com.au  Fact Sheet for Healthcare Providers:  IncredibleEmployment.be  This test is no t yet approved or cleared by the [place] FDA and  has been authorized for detection and/or diagnosis of SARS-CoV-2 by FDA under an Emergency Use Authorization (EUA). This EUA will remain  in effect (meaning this test can be used) for the duration of the COVID-19 declaration under Section 564(b)(1) of the Act, 21 U.S.C.section 360bbb-3(b)(1), unless the authorization is terminated  or revoked sooner.       Influenza A by PCR NEGATIVE NEGATIVE Final   Influenza B by PCR NEGATIVE NEGATIVE Final    Comment: (NOTE) The Xpert Xpress SARS-CoV-2/FLU/RSV plus assay is intended as an aid in the diagnosis of influenza from Nasopharyngeal swab specimens and should not be used as a sole basis for treatment. Nasal washings and aspirates are unacceptable for Xpert Xpress SARS-CoV-2/FLU/RSV testing.  Fact Sheet for Patients: EntrepreneurPulse.com.au  Fact Sheet for Healthcare Providers: IncredibleEmployment.be  This test is not yet approved or cleared by the [place] FDA and has been authorized for detection and/or diagnosis of SARS-CoV-2 by FDA under an Emergency Use Authorization (EUA). This EUA will remain in effect (meaning this test can be used) for the duration of the COVID-19 declaration under Section 564(b)(1) of the Act, 21 U.S.C. section 360bbb-3(b)(1), unless the authorization is terminated or revoked.  Performed at [facility], [address] [place], [place] [address]   Blood culture (routine x 2)     Status: None (Preliminary result)   Collection Time: 06/07/21  1:01 PM   Specimen: BLOOD  Result Value Ref Range Status   Specimen  Description BLOOD LEFT ANTECUBITAL  Final   Special Requests   Final    BOTTLES DRAWN AEROBIC AND ANAEROBIC Blood Culture results may not be optimal due to an inadequate volume of blood received in culture bottles   Culture   Final    NO GROWTH < 24 HOURS Performed at [facility], [address], [place], [place] [address]    Report Status PENDING  Incomplete  Blood culture (routine x 2)     Status: None (Preliminary result)   Collection Time: 06/07/21  1:21 PM   Specimen: BLOOD  Result Value Ref Range Status   Specimen Description BLOOD BLOOD LEFT HAND  Final   Special Requests   Final    BOTTLES DRAWN AEROBIC ONLY Blood Culture results  may not be optimal due to an inadequate volume of blood received in culture bottles   Culture   Final    NO GROWTH < 24 HOURS Performed at [facility], [address], [place], [place] [address]    Report Status PENDING  Incomplete    Radiology Studies: No results found.     Chalyn Amescua T. Ist[place]atta  If 7PM-7AM, please contact night-coverage [email or web address] 06/08/2021, 3:05 PM

## 2021-06-08 NOTE — Evaluation (Signed)
Physical Therapy Evaluation Patient Details Name: Derek Clarke MRN: [id number] DOB: 1956-08-11 Today's Date: 06/08/2021  History of Present Illness  Pt is a 64 y.o. male presenting to hospital 06/07/21 with worsening shortness of breath. He was scheduled for PET scan but unable to complete. Referred to ED.  Pt admitted with Severe sepsis with COPD exacerbation. PMH significant: adenocarcinoma fo rlung, chronic respiratory failure on 4L o2 at baseline, former smoker, anxiety;  Clinical Impression  65 yo Male presents to hospital with increased shortness of breath. PMH significant for lung cancer. He is on 4L O2 at baseline. Upon admission, WBC elevated, tachycardic, CT(-) for PE, (-) COVID test, awaiting 2D echo for further evaluation. He was diagnosed with severe sepsis with COPD exacerbation. Patient lives at home alone. He does have stairs to enter with B rails (Can reach both). Patient reports being independent in all self care ADLs prior to admittance. Patient denies any recent falls. He reports general decline in mobility over last several weeks after receiving port in right chest. Patient exhibits functional strength. He is able to transfer and walk independently. PT monitored vitals with gait, SPO2 94%, HR 115 following gait. He continues to be short of breath. Would benefit from skilled PT Intervention to improve mobility while in hospital and prevent future decline.        Recommendations for follow up therapy are one component of a multi-disciplinary discharge planning process, led by the attending physician.  Recommendations may be updated based on patient status, additional functional criteria and insurance authorization.  Follow Up Recommendations No PT follow up    Equipment Recommendations  None recommended by PT    Recommendations for Other Services       Precautions / Restrictions Precautions Precautions: Fall Precaution Comments: Metastatic disease Restrictions Weight  Bearing Restrictions: No      Mobility  Bed Mobility               General bed mobility comments: deferred (pt sitting in recliner beginning/end of session)    Transfers Overall transfer level: Independent Equipment used: None Transfers: Sit to/from Stand Sit to Stand: Independent         General transfer comment: steady safe transfers  Ambulation/Gait Ambulation/Gait assistance: Independent Gait Distance (Feet): 50 Feet Assistive device: None Gait Pattern/deviations: Step-through pattern Gait velocity: mildly decreased   General Gait Details: steady ambulation  Stairs            Wheelchair Mobility    Modified Rankin (Stroke Patients Only)       Balance Overall balance assessment: Independent                                           Pertinent Vitals/Pain Pain Assessment: 0-10 Pain Score: 3  Pain Location: left upper quadrant/chest Pain Descriptors / Indicators: Aching Pain Intervention(s): Limited activity within patient's tolerance;Monitored during session;Repositioned    Home Living Family/patient expects to be discharged to:: Private residence Living Arrangements: Alone Available Help at Discharge: Family;Other (Comment) (brother lives near [place]) Type of Home: Mobile home Home Access: Stairs to enter Entrance Stairs-Rails: Right;Left;Can reach both Entrance Stairs-Number of Steps: 3 Home Layout: One level Home Equipment: None      Prior Function Level of Independence: Independent (reports decreased mobility last few weeks)         Comments: (+) driving     Hand Dominance  Dominant Hand: Right    Extremity/Trunk Assessment   Upper Extremity Assessment Upper Extremity Assessment: Overall WFL for tasks assessed    Lower Extremity Assessment Lower Extremity Assessment: Overall WFL for tasks assessed    Cervical / Trunk Assessment Cervical / Trunk Assessment: Normal  Communication    Communication: No difficulties  Cognition Arousal/Alertness: Awake/alert Behavior During Therapy: WFL for tasks assessed/performed Overall Cognitive Status: Within Functional Limits for tasks assessed                                 General Comments: patient short of breath requiring increased time to communicate/respond to question;      General Comments General comments (skin integrity, edema, etc.): does exhibit increased edema in BLE (mild pitting)    Exercises Other Exercises Other Exercises: Educated patient in importance of seated LAQ;/ankle pumps while sitting to reduce swelling in BLE;   Assessment/Plan    PT Assessment Patient needs continued PT services  PT Problem List Decreased activity tolerance;Decreased mobility;Cardiopulmonary status limiting activity       PT Treatment Interventions Gait training;Stair training;Functional mobility training;Therapeutic activities;Therapeutic exercise;Patient/family education    PT Goals (Current goals can be found in the Care Plan section)  Acute Rehab PT Goals Patient Stated Goal: "To improve breathing" PT Goal Formulation: With patient Time For Goal Achievement: 06/22/21 Potential to Achieve Goals: Fair    Frequency Min 2X/week   Barriers to discharge Decreased caregiver support      Co-evaluation               AM-PAC PT "6 Clicks" Mobility  Outcome Measure Help needed turning from your back to your side while in a flat bed without using bedrails?: None Help needed moving from lying on your back to sitting on the side of a flat bed without using bedrails?: None Help needed moving to and from a bed to a chair (including a wheelchair)?: None Help needed standing up from a chair using your arms (e.g., wheelchair or bedside chair)?: None Help needed to walk in hospital room?: A Little Help needed climbing 3-5 steps with a railing? : A Little 6 Click Score: 22    End of Session Equipment Utilized  During Treatment: Gait belt;Oxygen Activity Tolerance: Patient tolerated treatment well Patient left: in chair;with call bell/phone within reach Nurse Communication: Mobility status;Precautions;Other (comment) PT Visit Diagnosis: Difficulty in walking, not elsewhere classified (R26.2)    Time: 7588-3254 PT Time Calculation (min) (ACUTE ONLY): 23 min   Charges:   PT Evaluation $PT Eval Low Complexity: 1 Low            Holt Woolbright PT, DPT 06/08/2021, 10:59 AM

## 2021-06-08 NOTE — Consult Note (Signed)
[facility]  [phone] [phone]   Name: Derek Clarke Date: 06/08/2021 MRN: [id number]  DOB: [birth year]  Patient Care Team: Pcp, No as PCP - Pierre Bali, RN as Oncology Nurse Navigator    REASON FOR CONSULTATION: Derek Clarke is a 65 y.o. 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  Palliative care was consulted help address goals  SOCIAL HISTORY:     reports that he quit smoking about 2 months ago. His smoking use included cigarettes. He has a 30.00 pack-year smoking history. He has never used smokeless tobacco. He reports current alcohol use. He reports current drug use. Drug: Marijuana.  Patient is unmarried and has no children.  He lives at home alone.  He has a brother who lives in [place].  Patient is a veteran from the [place].  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  His last job was at a [employer].  ADVANCE DIRECTIVES:  On file  CODE STATUS: Full code  PAST MEDICAL HISTORY: Past Medical History:  Diagnosis Date   Adenocarcinoma ([place])    lung;   COPD (chronic obstructive pulmonary disease) ([place])     PAST SURGICAL HISTORY:  Past Surgical History:  Procedure Laterality Date   IR IMAGING GUIDED PORT INSERTION  06/03/2021    HEMATOLOGY/ONCOLOGY HISTORY:  Oncology History  Adenocarcinoma of left lung ([place])  05/26/2021 Initial Diagnosis   Adenocarcinoma of left lung ([place])   05/27/2021 Cancer Staging   Staging form: Lung, AJCC 8th Edition - Clinical stage from 05/27/2021: Stage IVB (cTX, cN3, pM1c) - Signed by Lloyd Huger, MD on 05/27/2021   06/09/2021 -  Chemotherapy    Patient is on  Treatment Plan: LUNG NSCLC CARBOPLATIN + PACLITAXEL + BEVACIZUMAB Q21D X 1 CYCLE         ALLERGIES:  has No Known Allergies.  MEDICATIONS:  Current Facility-Administered Medications  Medication Dose Route Frequency Provider Last Rate Last Admin   acetaminophen (TYLENOL) tablet 650 mg  650 mg Oral Q6H PRN Ivor Costa, MD       albuterol (PROVENTIL) (2.5 MG/3ML) 0.083% nebulizer solution 2.5 mg  2.5 mg Nebulization Q4H PRN Ivor Costa, MD       ALPRAZolam Duanne Moron) tablet 0.5 mg  0.5 mg Oral BID PRN Ivor Costa, MD   0.5 mg at 06/08/21 0007   azithromycin (ZITHROMAX) tablet 250 mg  250 mg Oral Daily Ivor Costa, MD   250 mg at 06/08/21 0929   chlorhexidine (PERIDEX) 0.12 % solution 15 mL  15 mL Mouth Rinse BID Ivor Costa, MD   15 mL at 06/08/21 0929   Chlorhexidine Gluconate Cloth 2 % PADS 6 each  6 each Topical Daily Mercy Riding, MD   6 each at 06/08/21 0929   dextromethorphan-guaiFENesin ([place] DM) 30-600 MG per 12 hr tablet 1 tablet  1 tablet Oral BID PRN Ivor Costa, MD   1 tablet at 06/08/21 1214   diphenhydrAMINE (BENADRYL) tablet 25 mg  25 mg Oral Q6H Ivor Costa, MD   25 mg at 06/08/21 0929   enoxaparin (LOVENOX) injection 52.5 mg  0.5 mg/kg Subcutaneous Q24H Ivor Costa, MD   52.5 mg at  06/07/21 2129   feeding supplement (ENSURE ENLIVE / ENSURE PLUS) liquid 237 mL  237 mL Oral BID BM Ivor Costa, MD   237 mL at 06/08/21 0929   ibuprofen (ADVIL) tablet 400 mg  400 mg Oral Q6H PRN Ivor Costa, MD       ipratropium-albuterol (DUONEB) 0.5-2.5 (3) MG/3ML nebulizer solution 3 mL  3 mL Nebulization Q4H Ivor Costa, MD   3 mL at 06/08/21 1129   MEDLINE mouth rinse  15 mL Mouth Rinse q12n4p Ivor Costa, MD       methylPREDNISolone sodium succinate (SOLU-MEDROL) 125 mg/2 mL injection 60 mg  60 mg Intravenous Q12H Ivor Costa, MD   60 mg at 06/08/21 0525   multivitamin with minerals tablet 1 tablet  1 tablet Oral Daily Ivor Costa, MD   1 tablet at 06/08/21 0929   ondansetron (ZOFRAN) injection 4 mg  4  mg Intravenous Q8H PRN Ivor Costa, MD   4 mg at 06/07/21 1952    VITAL SIGNS: BP 130/81 (BP Location: Right Arm)   Pulse (!) 108   Temp 98.7 F (37.1 C)   Resp 17   Ht _0  (1.753 m)   Wt 220 lb 10.9 oz (100.1 kg)   SpO2 96%   BMI 32.59 kg/m  Filed Weights   06/07/21 1045 06/08/21 0525  Weight: 229 lb 15 oz (104.3 kg) 220 lb 10.9 oz (100.1 kg)    Estimated body mass index is 32.59 kg/m as calculated from the following:   Height as of this encounter: _1  (1.753 m).   Weight as of this encounter: 220 lb 10.9 oz (100.1 kg).  LABS: CBC:    Component Value Date/Time   WBC 11.8 (H) 06/08/2021 0728   HGB 13.6 06/08/2021 0728   HCT 40.8 06/08/2021 0728   PLT 226 06/08/2021 0728   MCV 82.8 06/08/2021 0728   NEUTROABS 8.4 (H) 05/14/2021 2306   LYMPHSABS 0.9 05/14/2021 2306   MONOABS 1.0 05/14/2021 2306   EOSABS 0.1 05/14/2021 2306   BASOSABS 0.0 05/14/2021 2306   Comprehensive Metabolic Panel:    Component Value Date/Time   NA 135 06/07/2021 1050   K 4.3 06/07/2021 1050   CL 92 (L) 06/07/2021 1050   CO2 28 06/07/2021 1050   BUN 18 06/07/2021 1050   CREATININE 0.70 06/07/2021 1050   GLUCOSE 103 (H) 06/07/2021 1050   CALCIUM 9.6 06/07/2021 1050   AST 25 06/07/2021 1050   ALT 19 06/07/2021 1050   ALKPHOS 109 06/07/2021 1050   BILITOT 1.1 06/07/2021 1050   PROT 7.2 06/07/2021 1050   ALBUMIN 3.3 (L) 06/07/2021 1050    RADIOGRAPHIC STUDIES: DG Chest 1 View  Result Date: 05/14/2021 CLINICAL DATA:  Shortness of breath for couple of months. EXAM: CHEST  1 VIEW COMPARISON:  None. FINDINGS: Mild cardiac enlargement. Perihilar infiltrates bilaterally may represent edema or multifocal pneumonia. No pleural effusions. No pneumothorax. Mediastinal contours appear intact. IMPRESSION: Bilateral perihilar infiltrates. Electronically Signed   By: Lucienne Capers M.D.   On: 05/14/2021 23:11   CT Angio Chest PE W and/or Wo Contrast  Result Date: 06/07/2021 CLINICAL DATA:   Tachycardia, vomiting, metastatic lung cancer EXAM: CT ANGIOGRAPHY CHEST WITH CONTRAST TECHNIQUE: Multidetector CT imaging of the chest was performed using the standard protocol during bolus administration of intravenous contrast. Multiplanar CT image reconstructions and MIPs were obtained to evaluate the vascular anatomy. CONTRAST:  51m OMNIPAQUE IOHEXOL 350 MG/ML SOLN COMPARISON:  06/08/2011, 05/15/2021, FINDINGS: Cardiovascular: This is a  technically adequate evaluation of the pulmonary vasculature. No filling defects or pulmonary emboli. Stable left hilar soft tissue mass encasing the left upper and left lower pulmonary arteries. Stable trace pericardial effusion. The heart is not enlarged. No evidence of thoracic aortic aneurysm or dissection. Mild atherosclerosis of the aorta and coronary vessels. Mediastinum/Nodes: There is segmental wall thickening of the mid to distal thoracic esophagus. Confluent soft tissue within the mediastinum and bilateral hila consistent with adenopathy, not grossly changed since prior study. An enlarged left supraclavicular lymph node on image 9/4 measures 13 mm in short axis, unchanged. Thyroid and trachea are unremarkable. Lungs/Pleura: There are trace bilateral pleural effusions. Since the prior exam, there is progressive near complete consolidation of the left upper lobe, which may reflect progressive disease and/or postobstructive change. There is diffuse bilateral bronchial wall thickening, greatest at the lung bases. Continued right middle lobe collapse with occlusion of the right middle lobe bronchus again noted. Upper Abdomen: There are stable bilateral adrenal nodules, measuring up to 2.2 cm on the right. Multiple enlarged lymph nodes are again seen within the central upper abdomen, largest measuring 1.7 cm just posterior to the gastric cardia. Multiple hypodensities are again seen within the liver consistent with metastatic disease, grossly unchanged since prior study.  Musculoskeletal: Stable sclerotic focus within the left T10 transverse process and within the T9 vertebral body, compatible with bony metastases. No acute displaced fractures. Reconstructed images demonstrate no additional findings. Review of the MIP images confirms the above findings. IMPRESSION: 1. No evidence of pulmonary embolus. 2. Persistent left hilar mass surrounding the left hilar vessels, with increasing near total consolidation of the left upper lobe since prior study. This may be progression of malignancy versus postobstructive change. 3. Continued coalescing lymphadenopathy throughout the mediastinum and bilateral hila. 4. Bilateral bronchial wall thickening, greatest at the lung bases. 5. Trace bilateral pleural effusions. 6. Segmental wall thickening of the mid to distal thoracic esophagus, which could be inflammatory or infectious. 7. Upper abdominal metastases with continued multiple liver lesions, bilateral adrenal nodularity, and upper abdominal adenopathy. Electronically Signed   By: Randa Ngo M.D.   On: 06/07/2021 15:12   CT Angio Chest PE W/Cm &/Or Wo Cm  Result Date: 05/15/2021 CLINICAL DATA:  Hemoptysis EXAM: CT ANGIOGRAPHY CHEST WITH CONTRAST TECHNIQUE: Multidetector CT imaging of the chest was performed using the standard protocol during bolus administration of intravenous contrast. Multiplanar CT image reconstructions and MIPs were obtained to evaluate the vascular anatomy. CONTRAST:  193m OMNIPAQUE IOHEXOL 350 MG/ML SOLN COMPARISON:  None. FINDINGS: Cardiovascular: Contrast injection is sufficient to demonstrate satisfactory opacification of the pulmonary arteries to the segmental level. There is no pulmonary embolus or evidence of right heart strain. The size of the main pulmonary artery is normal. Proximal left pulmonary arteries are severely narrowed by confluent soft tissue surrounding the left hilum. The course and caliber of the aorta are normal. There is atherosclerotic  calcification. Opacification decreased due to pulmonary arterial phase contrast bolus timing. There is a small pericardial effusion. Mediastinum/Nodes: There is bulky abnormal mediastinal soft tissue with in case mint of the left common carotid artery and proximal left subclavian artery. There are multiple enlarged mediastinal lymph nodes. Level 4 are node measures 10 mm. Lungs/Pleura: There is confluent soft tissue at the left hilum encasing the main left pulmonary artery and severely narrowing the proximal lobar branches. Abnormal soft tissue extends in a peribronchial pattern. There is also abnormal soft tissue at the right hilum. There is atelectasis within  the right middle lobe and an area of consolidation in the lingula. Small pleural effusions. Upper Abdomen: Contrast bolus timing is not optimized for evaluation of the abdominal organs. There is an intermediate density 2.0 x 1.3 cm right adrenal nodule. There is a lower density left adrenal nodule measuring 1.5 cm. Musculoskeletal: No chest wall abnormality. No bony spinal canal stenosis. Review of the MIP images confirms the above findings. IMPRESSION: 1. No pulmonary embolus or acute aortic syndrome. 2. Bulky abnormal soft tissue at the left hilum encasing the main left pulmonary artery and severely narrowing the proximal lobar branches, consistent with malignancy. 3. Multiple enlarged mediastinal lymph nodes, consistent with lymphatic metastatic disease. 4. Bilateral adrenal nodules, measuring up to 2.0 x 1.3 cm. The right adrenal nodule is concerning for metastatic disease. The left may be an adenoma. 5. Small pericardial effusion and small pleural effusions. 6. Consolidation in the lingula may be postobstructive. Aortic atherosclerosis (ICD10-I70.0). Electronically Signed   By: Ulyses Jarred M.D.   On: 05/15/2021 01:57   MR BRAIN W WO CONTRAST  Result Date: 05/18/2021 CLINICAL DATA:  Lung cancer diagnosis.  Staging. EXAM: MRI HEAD WITHOUT AND WITH  CONTRAST TECHNIQUE: Multiplanar, multiecho pulse sequences of the brain and surrounding structures were obtained without and with intravenous contrast. CONTRAST:  60m GADAVIST GADOBUTROL 1 MMOL/ML IV SOLN COMPARISON:  None. FINDINGS: Brain: Study suffers from motion degradation. Diffusion imaging does not show any acute or subacute infarction or other cause of restricted diffusion. No evidence of chronic small vessel disease or old cortical infarction. Very few punctate foci of T2 and FLAIR signal within the white matter. No sign of mass lesion, hemorrhage, hydrocephalus or extra-axial collection. No abnormal contrast enhancement occurs. Vascular: Major vessels at the base of the brain show flow. Skull and upper cervical spine: Negative Sinuses/Orbits: Clear/normal Other: None IMPRESSION: No evidence of regional metastatic disease. Sensitivity is slightly limited by motion degradation and utilization of fast scanning protocol. I do not see any suspicious findings. Electronically Signed   By: MNelson ChimesM.D.   On: 05/18/2021 13:44   CT ABDOMEN PELVIS W CONTRAST  Result Date: 05/15/2021 CLINICAL DATA:  Abnormal left hilar soft tissue noted on chest CTA. This was concerning for malignancy. Additional evaluation to assess for possible metastatic disease. EXAM: CT ABDOMEN AND PELVIS WITH CONTRAST TECHNIQUE: Multidetector CT imaging of the abdomen and pelvis was performed using the standard protocol following bolus administration of intravenous contrast. CONTRAST:  837mOMNIPAQUE IOHEXOL 350 MG/ML SOLN COMPARISON:  Current chest CTA. FINDINGS: Lower chest: Small right effusion. Left lung base subcentimeter nodules. Small patchy areas of ground-glass opacity at the left lung base. Pericardial effusion. No change from the earlier chest CTA. Hepatobiliary: Subtle liver lesions suspicious for metastatic disease. Largest lies in the right lobe, segment 6, 1.4 cm in size. Liver normal in size and overall attenuation.  Mildly distended gallbladder. Increased attenuation material in the dependent gallbladder possibly small stones or sludge. No wall thickening or inflammation. No bile duct dilation. Pancreas: Unremarkable. No pancreatic ductal dilatation or surrounding inflammatory changes. Spleen: Normal in size without focal abnormality. Adrenals/Urinary Tract: Bilateral adrenal masses, right 2.1 x 1.4 cm, left 1.1 cm size. Symmetric renal enhancement and excretion. No renal masses, stones or hydronephrosis. Normal ureters. Bladder minimally distended. Wall appears thickened. No bladder mass. Stomach/Bowel: Normal stomach. Small bowel and colon are normal in caliber. No wall thickening. No inflammation. Normal appendix suggested. No evidence of appendicitis. Vascular/Lymphatic: Mild aortic atherosclerosis.  No aneurysm. Prominent  lymph nodes above the celiac axis, largest 1.2 cm in short axis. Prominent but subcentimeter retroperitoneal lymph nodes. No other enlarged nodes. Reproductive: Significant prostate enlargement elevating the bladder base. Prostate measures 6.9 cm superior to inferior by 5.8 x 5.6 cm transversely. Other: No abdominal wall hernia or abnormality. No abdominopelvic ascites. Musculoskeletal: Small area of sclerosis in the right ilium, likely a bone island. More definitive bone island noted in the anterior left femoral head. Vague sclerotic lesion along the lower endplate of T9. No osteolytic lesions. No fracture or acute finding. IMPRESSION: 1. Subtle low-density liver lesions consistent with metastatic disease. 2. Bilateral adrenal masses suspicious for metastatic disease. 3. Prominent lymph nodes above the celiac axis suspicious for metastatic disease. 4. Subtle areas of sclerosis, right ilium and lower endplate of T9, nonspecific, possibly metastatic disease. 5. No acute findings. Electronically Signed   By: Lajean Manes M.D.   On: 05/15/2021 17:56   MR LIVER W WO CONTRAST  Result Date:  05/18/2021 CLINICAL DATA:  Inpatient. Acute respiratory failure with infiltrative left upper lobe soft tissue and bilateral mediastinal adenopathy suspicious on chest CT for lung malignancy. Indeterminate liver and bilateral adrenal lesions on CT abdomen study. EXAM: MRI ABDOMEN WITHOUT AND WITH CONTRAST TECHNIQUE: Multiplanar multisequence MR imaging of the abdomen was performed both before and after the administration of intravenous contrast. CONTRAST:  16m GADAVIST GADOBUTROL 1 MMOL/ML IV SOLN COMPARISON:  05/15/2021 CT abdomen/pelvis and chest CT angiogram. FINDINGS: Substantially motion degraded exam, limiting assessment. Lower chest: Small right and trace left dependent pleural effusions. Geographic consolidation in the visualized lingula and right middle lobe. Hepatobiliary: Normal liver size and configuration. No hepatic steatosis. There are several (at least 5) mildly T2 hyperintense T1 hypointense hypoenhancing liver masses scattered throughout the liver with restricted diffusion, compatible with metastatic disease, largest 1.5 x 1.3 cm in the segment 6 right liver (series 4/image 20), 1.3 x 1.1 cm in the anterior segment 4 left liver (series 4/image 18) and 1.1 x 1.1 cm in the segment 8 right liver (series 4/image 9). Normal gallbladder with no cholelithiasis. No biliary ductal dilatation. Common bile duct diameter 4 mm. No choledocholithiasis. No biliary masses, strictures or beading. Pancreas: No pancreatic mass or duct dilation.  No pancreas divisum. Spleen: Normal size. No mass. Adrenals/Urinary Tract: Enhancing right adrenal 2.5 x 1.7 cm nodule (series 3/image 18) demonstrates no significant loss of signal intensity on chemical shift imaging and is indeterminate. Similar enhancing left adrenal 1.5 x 1.3 cm nodule (series 3/image 23), too small to characterize for intralesional lipid on the motion degraded chemical shift images. No hydronephrosis. Scattered bilateral simple renal cysts, largest 1.6  cm in the anterior upper left kidney. T2 hypointense T1 hyperintense 1.1 cm renal cortical lesion in the lower right kidney (series 4/image 33) without compelling enhancement on the motion degraded postcontrast sequences, compatible with hemorrhagic/proteinaceous Bosniak category 2 renal cyst. No overtly suspicious renal masses. Stomach/Bowel: Normal non-distended stomach. Visualized small and large bowel is normal caliber, with no bowel wall thickening. Vascular/Lymphatic: Normal caliber abdominal aorta. Patent portal, splenic, hepatic and renal veins. A few mildly enlarged left para-aortic nodes, largest 1.2 cm short axis diameter (series 18/image 66). Enlarged 1.5 cm high left retroperitoneal node anterior to the left diaphragmatic crus (series 18/image 37). Other: No abdominal ascites or focal fluid collection. Musculoskeletal: Enhancing inferior T9 vertebral 2.8 cm and right T10 vertebral 1.1 cm bone lesions (series 17/image 19). Enhancing 2.1 cm medial right iliac bone lesion (series 17/image 19). IMPRESSION: 1.  Limited motion degraded scan. 2. Several (at least 5) hypoenhancing liver masses scattered throughout the liver, with MRI features compatible with metastatic disease. Percutaneous biopsy of the 1.5 cm segment 6 right liver or 1.3 cm anterior segment 4 left liver lesions could be considered. 3. Indeterminate enhancing bilateral adrenal nodules, measuring 2.5 cm on the right and 1.5 cm on the left. Metastatic disease is not excluded. 4. Left para-aortic and high left retroperitoneal lymphadenopathy suspicious for metastatic disease. 5. Enhancing T9 and T10 vertebral and medial right iliac bone lesions, suspicious for bone metastases. 6. Small right and trace left dependent pleural effusions. Geographic consolidation in the visualized lingula and right middle lobe, nonspecific, please see recent chest CT angiogram study for details. Electronically Signed   By: Ilona Sorrel M.D.   On: 05/18/2021 16:39    DG Chest Portable 1 View  Result Date: 06/07/2021 CLINICAL DATA:  Cough, shortness of breath. EXAM: PORTABLE CHEST 1 VIEW COMPARISON:  May 14, 2021. FINDINGS: Stable cardiomediastinal silhouette. Interval placement of right internal jugular Port-A-Cath with distal tip in expected position of the SVC. No pneumothorax is noted. Left upper lobe consolidation is noted concerning for pneumonia or atelectasis. Probable central pulmonary vascular congestion is noted. Bony thorax is unremarkable. IMPRESSION: Left upper lobe opacity is noted concerning for pneumonia or atelectasis. Electronically Signed   By: Marijo Conception M.D.   On: 06/07/2021 12:13   [place] CORE BIOPSY (LYMPH NODES)  Result Date: 05/20/2021 INDICATION: Possible lung carcinoma by prior imaging with vague liver lesions and also enlarged left supraclavicular lymph node by CT. Due to oxygen requirement and poor respiratory status, biopsy of the left supraclavicular lymph node is performed without sedation as tolerance a liver biopsy currently with sedation would be borderline and potentially high risk. EXAM: ULTRASOUND GUIDED CORE BIOPSY OF LEFT SUPRACLAVICULAR LYMPH NODE MEDICATIONS: None. ANESTHESIA/SEDATION: None PROCEDURE: The procedure, risks, benefits, and alternatives were explained to the patient. Questions regarding the procedure were encouraged and answered. The patient understands and consents to the procedure. A time-out was performed prior to initiating the procedure. The left neck was prepped with chlorhexidine in a sterile fashion, and a sterile drape was applied covering the operative field. A sterile gown and sterile gloves were used for the procedure. Local anesthesia was provided with 1% Lidocaine. After localizing an enlarged left supraclavicular lymph node, an 18 gauge core biopsy device was utilized in obtaining 4 separate core biopsy samples. Material was submitted in formalin. Additional ultrasound was performed.  COMPLICATIONS: None immediate. FINDINGS: Several left supraclavicular and lower cervical lymph nodes are identified. The largest measures approximately 1.7 x 1.3 x 1.4 cm by ultrasound. Solid core biopsy samples were obtained from this lymph node. IMPRESSION: Ultrasound-guided core biopsy performed enlarged left supraclavicular lymph node. Electronically Signed   By: Aletta Edouard M.D.   On: 05/20/2021 11:02   IR IMAGING GUIDED PORT INSERTION  Result Date: 06/03/2021 INDICATION: Metastatic lung cancer.  Chemotherapy. EXAM: IMPLANTED PORT A CATH PLACEMENT WITH ULTRASOUND AND FLUOROSCOPIC GUIDANCE MEDICATIONS: None ANESTHESIA/SEDATION: Moderate (conscious) sedation was employed during this procedure. A total of Versed 0 mg and Fentanyl 25 mcg was administered intravenously. Moderate Sedation Time: 20 minutes. The patient's level of consciousness and vital signs were monitored continuously by radiology nursing throughout the procedure under my direct supervision. FLUOROSCOPY TIME:  0 minutes, 24 seconds (4.5 mGy) COMPLICATIONS: None immediate. PROCEDURE: The procedure, risks, benefits, and alternatives were explained to the patient. Questions regarding the procedure were encouraged and answered. The  patient understands and consents to the procedure. The neck and chest were prepped with chlorhexidine in a sterile fashion, and a sterile drape was applied covering the operative field. Maximum barrier sterile technique with sterile gowns and gloves were used for the procedure. A timeout was performed prior to the initiation of the procedure. Local anesthesia was provided with 1% lidocaine with epinephrine. After creating a small venotomy incision, a micropuncture kit was utilized to access the internal jugular vein under direct, real-time ultrasound guidance. Ultrasound image documentation was performed. The microwire was kinked to measure appropriate catheter length. A subcutaneous port pocket was then created along  the upper chest wall utilizing a combination of sharp and blunt dissection. The pocket was irrigated with sterile saline. A single lumen ISP power injectable port was chosen for placement. The 8 Fr catheter was tunneled from the port pocket site to the venotomy incision. The port was placed in the pocket. The external catheter was trimmed to appropriate length. At the venotomy, an 8 Fr peel-away sheath was placed over a guidewire under fluoroscopic guidance. The catheter was then placed through the sheath and the sheath was removed. Final catheter positioning was confirmed and documented with a fluoroscopic spot radiograph. The port was accessed with a Huber needle, aspirated and flushed with heparinized saline. The port pocket incision was closed with interrupted 3-0 Vicryl suture then Dermabond was applied, including at the venotomy incision. Dressings were placed. The patient tolerated the procedure well without immediate post procedural complication. IMPRESSION: Successful placement of a right internal jugular approach power injectable Port-A-Cath. The catheter is ready for immediate use. Michaelle Birks, MD [facility]iology Electronically Signed   By: Michaelle Birks M.D.   On: 06/03/2021 13:26    PERFORMANCE STATUS (ECOG) : 2 - Symptomatic, <50% confined to bed  Review of Systems Unless otherwise noted, a complete review of systems is negative.  Physical Exam General: NAD Pulmonary: Exertionally labored Extremities: no edema, no joint deformities Skin: no rashes Neurological: Weakness but otherwise nonfocal  IMPRESSION: I met with patient and his brother.  I introduced palliative care services and attempted to establish therapeutic rapport.  Both patient and brother state that they wish to remain optimistic and hope for clinical improvement.  Patient says that he feels somewhat better today and is less short of breath.  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  He is pending initiation of chemotherapy and says that he is interested in any treatment that may prolong his life.  Patient's only family and support is his brother who lives about 30 minutes away.  His brother is considering option of either moving patient to [facility] or moving in with patient here.  Patient brother is a retired [employer] manager.   We discussed CODE STATUS including probable futility associated with resuscitative efforts in the setting of a terminal cancer.  Patient says that he is not prepared to make any decisions at present but agreed to think about decision-making.  PLAN: -Continue current prescription treatment -Will plan to follow patient in the palliative care clinic after discharge from the hospital  Case and plan discussed with Dr. Grayland Ormond    Time Total: 60 minutes  Visit consisted of counseling and education dealing with the complex and emotionally intense issues of symptom management and palliative care in the setting of serious and potentially life-threatening illness.Greater than 50%  of this time was spent counseling and coordinating care related to the  above assessment and plan.  Signed by: Altha Harm, PhD, NP-C

## 2021-06-09 ENCOUNTER — Encounter: Payer: Self-pay | Admitting: Oncology

## 2021-06-09 ENCOUNTER — Inpatient Hospital Stay: Payer: Non-veteran care

## 2021-06-09 ENCOUNTER — Inpatient Hospital Stay: Payer: Non-veteran care | Admitting: Oncology

## 2021-06-09 DIAGNOSIS — C3492 Malignant neoplasm of unspecified part of left bronchus or lung: Secondary | ICD-10-CM

## 2021-06-09 MED ORDER — PREDNISONE 50 MG PO TABS: 50.0000 mg | ORAL_TABLET | Freq: Every day | ORAL | 0 refills | Status: AC

## 2021-06-09 MED ORDER — BUSPIRONE HCL 5 MG PO TABS: 5.0000 mg | ORAL_TABLET | Freq: Three times a day (TID) | ORAL | 1 refills | Status: AC

## 2021-06-09 MED ORDER — BUSPIRONE HCL 5 MG PO TABS
5.0000 mg | ORAL_TABLET | Freq: Three times a day (TID) | ORAL | Status: DC
  Filled 2021-06-09 (×2): qty 1

## 2021-06-09 MED ORDER — FLUTICASONE FUROATE-VILANTEROL 100-25 MCG/INH IN AEPB: 1.0000 | INHALATION_SPRAY | Freq: Every day | RESPIRATORY_TRACT | 1 refills | Status: DC

## 2021-06-09 MED ORDER — AZITHROMYCIN 250 MG PO TABS: 250.0000 mg | ORAL_TABLET | Freq: Every day | ORAL | 0 refills | Status: AC

## 2021-06-09 NOTE — Progress Notes (Signed)
Mobility Specialist - Progress Note   06/09/21 1300  Mobility  Activity Ambulated in room  Level of Assistance Independent  Assistive Device None  Distance Ambulated (ft) 100 ft  Mobility Ambulated independently in room  Mobility Response Tolerated well  Mobility performed by Mobility specialist  $Mobility charge 1 Mobility    Pre-mobility: 83 HR, 93% SpO2 During mobility: 105 HR, 86-94% SpO2 Post-mobility: 86 HR, 95% SpO2   Pt sitting in recliner upon arrival, utilizing 4L. Weaned pt to RA while resting = 93%. Pt ambulated 2 x 50' in room, on RA first bout and 2L second bout. O2 desat to a low of 86% while ambulating on RA but maintained > 93% while ambulating on 2L. Pt reports feeling exerted with activity, rating RPE 4/10. Pt returned to 4L prior to exit. RN notified of performance.    Derek Clarke Mobility Specialist 06/09/21, 2:36 PM

## 2021-06-09 NOTE — TOC Initial Note (Signed)
Transition of Care [facility]) - Initial/Assessment Note    Patient Details  Name: Derek Clarke MRN: [id number] Date of Birth: 23-Sep-1956  [facility] CM/SW Contact:    Kerin Salen, RN Phone Number: 06/09/2021, 11:51 AM  Clinical Narrative:  Spoke with patient this morning, Financial counselor in the room. Patient for possible discharge today, says he has Oxygen 4L continuous at home serviced by [place]. Patient voices not feeling well today do not feel that he should be discharged. I informed patient that I will let the Attending and Nurse know. Patient says he will need a ride home when discharged. Taxi voucher given to unit clerk to call if discharged.                       Patient Goals and CMS Choice        Expected Discharge Plan and Services                                                Prior Living Arrangements/Services                       Activities of Daily Living Home Assistive Devices/Equipment: None ADL Screening (condition at time of admission) Patient's cognitive ability adequate to safely complete daily activities?: No Is the patient deaf or have difficulty hearing?: No Does the patient have difficulty seeing, even when wearing glasses/contacts?: No Does the patient have difficulty concentrating, remembering, or making decisions?: No Patient able to express need for assistance with ADLs?: No Does the patient have difficulty dressing or bathing?: No Independently performs ADLs?: Yes (appropriate for developmental age) Does the patient have difficulty walking or climbing stairs?: No Weakness of Legs: None Weakness of Arms/Hands: None  Permission Sought/Granted                  Emotional Assessment              Admission diagnosis:  COPD exacerbation ([place]) [J44.1] Acute on chronic respiratory failure with hypoxia ([place]) [J96.21] Patient Active Problem List   Diagnosis Date Noted   Palliative care encounter     Acute on chronic respiratory failure with hypoxia ([place]) 06/07/2021   Chronic respiratory failure with hypoxia ([place]) 06/07/2021   COPD (chronic obstructive pulmonary disease) ([place])    Severe sepsis ([place])    Anxiety    COPD exacerbation ([place])    Adenocarcinoma of left lung ([place]) 05/26/2021   Hemoptysis 05/15/2021   Mass of left lung on CT 05/15/2021   Acute respiratory failure with hypoxia ([place]) 05/15/2021   Nicotine dependence 05/15/2021   PCP:  Pcp, No Pharmacy:   [id number] [place], [address] [place] [address] Phone: [phone] Fax: [phone]     Social Determinants of Health (SDOH) Interventions    Readmission Risk Interventions No flowsheet data found.

## 2021-06-09 NOTE — Discharge Summary (Signed)
Physician Discharge Summary  Derek Clarke RWE:[id number] DOB: [birth year] DOA: 06/07/2021  PCP: Pcp, No  Admit date: 06/07/2021 Discharge date: 06/09/2021  Admitted From: Home Disposition: Home  Recommendations for Outpatient Follow-up:  Follow ups as below. 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  CTA chest negative for PE but persistent left healer mass surrounding the left hilar vessels, with increasing near-total consolidation of RUL suggesting progression of malignancy versus postobstructive change, continued coalescing LAD throughout the mediastinum and bilateral hila and bilateral bronchial wall thickening.  Patient was also hypotensive.  Patient was started on COPD regimen with systemic steroid, azithromycin and breathing treatments.  Oncology consulted and recommended outpatient follow-up to discuss about chemotherapy once breathing improves.   On the day of discharge, patient's breathing improved.  There was also concern about his anxiety contributing to his respiratory symptoms.  He is already on Xanax although not a great choice.  We have added BuSpar to target anxiety.  He is discharged on p.o. prednisone, azithromycin and breathing treatment for his COPD.  Patient to follow-up with oncology for further evaluation and management of his lung cancer.  Patient desaturated to 86% with ambulation on room air but recovered to > 93% with ambulation on 2 L.  Patient was on 4 L at baseline.  Discharge  Diagnoses:  Severe sepsis due to COPD exacerbation and chronic respiratory failure with hypoxia: POA: Some concern about bacterial infection contributing to COPD exacerbation.  He was tachycardic, tachypneic with hypotension and lactic acidosis on presentation.  CTA chest negative for PE but bronchial wall thickening and changes related to lung cancer as above. No cardinal symptoms of CHF.  BNP within normal.  TTE without significant finding.  Blood cultures NGTD. -Discharged on p.o. prednisone and Z-Pak -Discharged on Breo Ellipta and rescue inhalers. -Added BuSpar for anxiety.  He is already on Xanax   Stage IV lung cancer with liver mets:  -Oncology to delay PET scan and chemotherapy for 1 week given his COPD exacerbation    Hypotension: Resolved after NS bolus.   Lactic acidosis: Resolved.   Anxiety: Seems to be driving some of his respiratory symptoms. -Added BuSpar 5 mg 3 times daily -Continue Xanax   Class I obesity Body mass index is 32.59 kg/m.            Discharge Exam: Vitals:   06/09/21 1339 06/09/21 1531  BP:  121/73  Pulse:  (!) 101  Resp:  19  Temp:  98.2 F (36.8 C)  SpO2: 92% 99%    GENERAL: No apparent distress.  Nontoxic. HEENT: MMM.  Vision and hearing grossly intact.  NECK: Supple.  No apparent JVD.  RESP: 97% on 4 L at rest.  No IWOB.  Fair aeration bilaterally. CVS:  RRR. Heart sounds normal.  ABD/GI/GU: Bowel sounds present. Soft. Non tender.  MSK/EXT:  Moves extremities. No apparent deformity. No edema.  SKIN: no apparent skin lesion or wound NEURO: Awake, alert and oriented appropriately.  No apparent focal neuro deficit. PSYCH: Appears anxious.  Discharge Instructions  Discharge Instructions  Call MD for:  difficulty breathing, headache or visual disturbances   Complete by: As directed    Call MD for:  extreme fatigue   Complete by: As directed    Call MD for:  persistant dizziness or light-headedness   Complete by: As directed     Diet general   Complete by: As directed    Discharge instructions   Complete by: As directed    It has been a pleasure taking care of you!  You were hospitalized with shortness of breath due to possible COPD exacerbation and lung cancer.  We have started you on steroid, antibiotics and breathing treatments.  We are discharging you more of these medications to complete treatment course.  Please follow-up with your oncologist as previously planned for further treatment of your lung cancer.  We have started you on BuSpar which might help with anxiety.  You can still continue Xanax as needed.  We also suggest nonpharmacologic interventions such as deep breathing, relaxation and bag breathing to help with anxiety.   Take care,   Increase activity slowly   Complete by: As directed       Allergies as of 06/09/2021   No Known Allergies      Medication List     STOP taking these medications    budesonide 180 MCG/ACT inhaler Commonly known as: PULMICORT       TAKE these medications    albuterol 108 (90 Base) MCG/ACT inhaler Commonly known as: VENTOLIN HFA Inhale 2 puffs into the lungs every 6 (six) hours as needed for wheezing or shortness of breath.   ALPRAZolam 0.5 MG tablet Commonly known as: XANAX Take 1 tablet (0.5 mg total) by mouth 2 (two) times daily as needed for anxiety.   azithromycin 250 MG tablet Commonly known as: ZITHROMAX Take 1 tablet (250 mg total) by mouth daily for 3 days.   busPIRone 5 MG tablet Commonly known as: BUSPAR Take 1 tablet (5 mg total) by mouth 3 (three) times daily.   chlorpheniramine 4 MG tablet Commonly known as: CHLOR-TRIMETON Take 4 mg by mouth 2 (two) times daily as needed for allergies.   feeding supplement Liqd Take 237 mLs by mouth 2 (two) times daily between meals.   fluticasone furoate-vilanterol 100-25 MCG/INH Aepb Commonly known as: Breo Ellipta Inhale 1 puff into the lungs daily.   guaiFENesin 600 MG 12 hr  tablet Commonly known as: MUCINEX Take 600 mg by mouth 2 (two) times daily as needed for cough or to loosen phlegm.   ibuprofen 400 MG tablet Commonly known as: ADVIL Take 200 mg by mouth every 4 (four) hours as needed for mild pain.   lidocaine-prilocaine cream Commonly known as: EMLA Apply to affected area once   multivitamin with minerals Tabs tablet Take 1 tablet by mouth daily.   ondansetron 8 MG tablet Commonly known as: Zofran Take 1 tablet (8 mg total) by mouth 2 (two) times daily as needed for refractory nausea / vomiting.   predniSONE 50 MG tablet Commonly known as: DELTASONE Take 1 tablet (50 mg total) by mouth daily for 3 days.   prochlorperazine 10 MG tablet Commonly known as: COMPAZINE Take 1 tablet (10 mg total) by mouth every 6 (six) hours as needed (Nausea or vomiting).        Consultations: Oncology  Procedures/Studies:   DG Chest 1 View  Result Date: 05/14/2021 CLINICAL DATA:  Shortness of breath for couple of months. EXAM: CHEST  1 VIEW COMPARISON:  None. FINDINGS:  Mild cardiac enlargement. Perihilar infiltrates bilaterally may represent edema or multifocal pneumonia. No pleural effusions. No pneumothorax. Mediastinal contours appear intact. IMPRESSION: Bilateral perihilar infiltrates. Electronically Signed   By: Lucienne Capers M.D.   On: 05/14/2021 23:11   CT Angio Chest PE W and/or Wo Contrast  Result Date: 06/07/2021 CLINICAL DATA:  Tachycardia, vomiting, metastatic lung cancer EXAM: CT ANGIOGRAPHY CHEST WITH CONTRAST TECHNIQUE: Multidetector CT imaging of the chest was performed using the standard protocol during bolus administration of intravenous contrast. Multiplanar CT image reconstructions and MIPs were obtained to evaluate the vascular anatomy. CONTRAST:  55m OMNIPAQUE IOHEXOL 350 MG/ML SOLN COMPARISON:  06/08/2011, 05/15/2021, FINDINGS: Cardiovascular: This is a technically adequate evaluation of the pulmonary vasculature. No filling  defects or pulmonary emboli. Stable left hilar soft tissue mass encasing the left upper and left lower pulmonary arteries. Stable trace pericardial effusion. The heart is not enlarged. No evidence of thoracic aortic aneurysm or dissection. Mild atherosclerosis of the aorta and coronary vessels. Mediastinum/Nodes: There is segmental wall thickening of the mid to distal thoracic esophagus. Confluent soft tissue within the mediastinum and bilateral hila consistent with adenopathy, not grossly changed since prior study. An enlarged left supraclavicular lymph node on image 9/4 measures 13 mm in short axis, unchanged. Thyroid and trachea are unremarkable. Lungs/Pleura: There are trace bilateral pleural effusions. Since the prior exam, there is progressive near complete consolidation of the left upper lobe, which may reflect progressive disease and/or postobstructive change. There is diffuse bilateral bronchial wall thickening, greatest at the lung bases. Continued right middle lobe collapse with occlusion of the right middle lobe bronchus again noted. Upper Abdomen: There are stable bilateral adrenal nodules, measuring up to 2.2 cm on the right. Multiple enlarged lymph nodes are again seen within the central upper abdomen, largest measuring 1.7 cm just posterior to the gastric cardia. Multiple hypodensities are again seen within the liver consistent with metastatic disease, grossly unchanged since prior study. Musculoskeletal: Stable sclerotic focus within the left T10 transverse process and within the T9 vertebral body, compatible with bony metastases. No acute displaced fractures. Reconstructed images demonstrate no additional findings. Review of the MIP images confirms the above findings. IMPRESSION: 1. No evidence of pulmonary embolus. 2. Persistent left hilar mass surrounding the left hilar vessels, with increasing near total consolidation of the left upper lobe since prior study. This may be progression of  malignancy versus postobstructive change. 3. Continued coalescing lymphadenopathy throughout the mediastinum and bilateral hila. 4. Bilateral bronchial wall thickening, greatest at the lung bases. 5. Trace bilateral pleural effusions. 6. Segmental wall thickening of the mid to distal thoracic esophagus, which could be inflammatory or infectious. 7. Upper abdominal metastases with continued multiple liver lesions, bilateral adrenal nodularity, and upper abdominal adenopathy. Electronically Signed   By: MRanda NgoM.D.   On: 06/07/2021 15:12   CT Angio Chest PE W/Cm &/Or Wo Cm  Result Date: 05/15/2021 CLINICAL DATA:  Hemoptysis EXAM: CT ANGIOGRAPHY CHEST WITH CONTRAST TECHNIQUE: Multidetector CT imaging of the chest was performed using the standard protocol during bolus administration of intravenous contrast. Multiplanar CT image reconstructions and MIPs were obtained to evaluate the vascular anatomy. CONTRAST:  1076mOMNIPAQUE IOHEXOL 350 MG/ML SOLN COMPARISON:  None. FINDINGS: Cardiovascular: Contrast injection is sufficient to demonstrate satisfactory opacification of the pulmonary arteries to the segmental level. There is no pulmonary embolus or evidence of right heart strain. The size of the main pulmonary artery is normal. Proximal left pulmonary arteries are severely narrowed by confluent soft  tissue surrounding the left hilum. The course and caliber of the aorta are normal. There is atherosclerotic calcification. Opacification decreased due to pulmonary arterial phase contrast bolus timing. There is a small pericardial effusion. Mediastinum/Nodes: There is bulky abnormal mediastinal soft tissue with in case mint of the left common carotid artery and proximal left subclavian artery. There are multiple enlarged mediastinal lymph nodes. Level 4 are node measures 10 mm. Lungs/Pleura: There is confluent soft tissue at the left hilum encasing the main left pulmonary artery and severely narrowing the proximal  lobar branches. Abnormal soft tissue extends in a peribronchial pattern. There is also abnormal soft tissue at the right hilum. There is atelectasis within the right middle lobe and an area of consolidation in the lingula. Small pleural effusions. Upper Abdomen: Contrast bolus timing is not optimized for evaluation of the abdominal organs. There is an intermediate density 2.0 x 1.3 cm right adrenal nodule. There is a lower density left adrenal nodule measuring 1.5 cm. Musculoskeletal: No chest wall abnormality. No bony spinal canal stenosis. Review of the MIP images confirms the above findings. IMPRESSION: 1. No pulmonary embolus or acute aortic syndrome. 2. Bulky abnormal soft tissue at the left hilum encasing the main left pulmonary artery and severely narrowing the proximal lobar branches, consistent with malignancy. 3. Multiple enlarged mediastinal lymph nodes, consistent with lymphatic metastatic disease. 4. Bilateral adrenal nodules, measuring up to 2.0 x 1.3 cm. The right adrenal nodule is concerning for metastatic disease. The left may be an adenoma. 5. Small pericardial effusion and small pleural effusions. 6. Consolidation in the lingula may be postobstructive. Aortic atherosclerosis (ICD10-I70.0). Electronically Signed   By: Ulyses Jarred M.D.   On: 05/15/2021 01:57   MR BRAIN W WO CONTRAST  Result Date: 05/18/2021 CLINICAL DATA:  Lung cancer diagnosis.  Staging. EXAM: MRI HEAD WITHOUT AND WITH CONTRAST TECHNIQUE: Multiplanar, multiecho pulse sequences of the brain and surrounding structures were obtained without and with intravenous contrast. CONTRAST:  54m GADAVIST GADOBUTROL 1 MMOL/ML IV SOLN COMPARISON:  None. FINDINGS: Brain: Study suffers from motion degradation. Diffusion imaging does not show any acute or subacute infarction or other cause of restricted diffusion. No evidence of chronic small vessel disease or old cortical infarction. Very few punctate foci of T2 and FLAIR signal within the  white matter. No sign of mass lesion, hemorrhage, hydrocephalus or extra-axial collection. No abnormal contrast enhancement occurs. Vascular: Major vessels at the base of the brain show flow. Skull and upper cervical spine: Negative Sinuses/Orbits: Clear/normal Other: None IMPRESSION: No evidence of regional metastatic disease. Sensitivity is slightly limited by motion degradation and utilization of fast scanning protocol. I do not see any suspicious findings. Electronically Signed   By: MNelson ChimesM.D.   On: 05/18/2021 13:44   CT ABDOMEN PELVIS W CONTRAST  Result Date: 05/15/2021 CLINICAL DATA:  Abnormal left hilar soft tissue noted on chest CTA. This was concerning for malignancy. Additional evaluation to assess for possible metastatic disease. EXAM: CT ABDOMEN AND PELVIS WITH CONTRAST TECHNIQUE: Multidetector CT imaging of the abdomen and pelvis was performed using the standard protocol following bolus administration of intravenous contrast. CONTRAST:  849mOMNIPAQUE IOHEXOL 350 MG/ML SOLN COMPARISON:  Current chest CTA. FINDINGS: Lower chest: Small right effusion. Left lung base subcentimeter nodules. Small patchy areas of ground-glass opacity at the left lung base. Pericardial effusion. No change from the earlier chest CTA. Hepatobiliary: Subtle liver lesions suspicious for metastatic disease. Largest lies in the right lobe, segment 6, 1.4 cm in  size. Liver normal in size and overall attenuation. Mildly distended gallbladder. Increased attenuation material in the dependent gallbladder possibly small stones or sludge. No wall thickening or inflammation. No bile duct dilation. Pancreas: Unremarkable. No pancreatic ductal dilatation or surrounding inflammatory changes. Spleen: Normal in size without focal abnormality. Adrenals/Urinary Tract: Bilateral adrenal masses, right 2.1 x 1.4 cm, left 1.1 cm size. Symmetric renal enhancement and excretion. No renal masses, stones or hydronephrosis. Normal ureters.  Bladder minimally distended. Wall appears thickened. No bladder mass. Stomach/Bowel: Normal stomach. Small bowel and colon are normal in caliber. No wall thickening. No inflammation. Normal appendix suggested. No evidence of appendicitis. Vascular/Lymphatic: Mild aortic atherosclerosis.  No aneurysm. Prominent lymph nodes above the celiac axis, largest 1.2 cm in short axis. Prominent but subcentimeter retroperitoneal lymph nodes. No other enlarged nodes. Reproductive: Significant prostate enlargement elevating the bladder base. Prostate measures 6.9 cm superior to inferior by 5.8 x 5.6 cm transversely. Other: No abdominal wall hernia or abnormality. No abdominopelvic ascites. Musculoskeletal: Small area of sclerosis in the right ilium, likely a bone island. More definitive bone island noted in the anterior left femoral head. Vague sclerotic lesion along the lower endplate of T9. No osteolytic lesions. No fracture or acute finding. IMPRESSION: 1. Subtle low-density liver lesions consistent with metastatic disease. 2. Bilateral adrenal masses suspicious for metastatic disease. 3. Prominent lymph nodes above the celiac axis suspicious for metastatic disease. 4. Subtle areas of sclerosis, right ilium and lower endplate of T9, nonspecific, possibly metastatic disease. 5. No acute findings. Electronically Signed   By: Lajean Manes M.D.   On: 05/15/2021 17:56   MR LIVER W WO CONTRAST  Result Date: 05/18/2021 CLINICAL DATA:  Inpatient. Acute respiratory failure with infiltrative left upper lobe soft tissue and bilateral mediastinal adenopathy suspicious on chest CT for lung malignancy. Indeterminate liver and bilateral adrenal lesions on CT abdomen study. EXAM: MRI ABDOMEN WITHOUT AND WITH CONTRAST TECHNIQUE: Multiplanar multisequence MR imaging of the abdomen was performed both before and after the administration of intravenous contrast. CONTRAST:  27m GADAVIST GADOBUTROL 1 MMOL/ML IV SOLN COMPARISON:  05/15/2021 CT  abdomen/pelvis and chest CT angiogram. FINDINGS: Substantially motion degraded exam, limiting assessment. Lower chest: Small right and trace left dependent pleural effusions. Geographic consolidation in the visualized lingula and right middle lobe. Hepatobiliary: Normal liver size and configuration. No hepatic steatosis. There are several (at least 5) mildly T2 hyperintense T1 hypointense hypoenhancing liver masses scattered throughout the liver with restricted diffusion, compatible with metastatic disease, largest 1.5 x 1.3 cm in the segment 6 right liver (series 4/image 20), 1.3 x 1.1 cm in the anterior segment 4 left liver (series 4/image 18) and 1.1 x 1.1 cm in the segment 8 right liver (series 4/image 9). Normal gallbladder with no cholelithiasis. No biliary ductal dilatation. Common bile duct diameter 4 mm. No choledocholithiasis. No biliary masses, strictures or beading. Pancreas: No pancreatic mass or duct dilation.  No pancreas divisum. Spleen: Normal size. No mass. Adrenals/Urinary Tract: Enhancing right adrenal 2.5 x 1.7 cm nodule (series 3/image 18) demonstrates no significant loss of signal intensity on chemical shift imaging and is indeterminate. Similar enhancing left adrenal 1.5 x 1.3 cm nodule (series 3/image 23), too small to characterize for intralesional lipid on the motion degraded chemical shift images. No hydronephrosis. Scattered bilateral simple renal cysts, largest 1.6 cm in the anterior upper left kidney. T2 hypointense T1 hyperintense 1.1 cm renal cortical lesion in the lower right kidney (series 4/image 33) without compelling enhancement on the motion  degraded postcontrast sequences, compatible with hemorrhagic/proteinaceous Bosniak category 2 renal cyst. No overtly suspicious renal masses. Stomach/Bowel: Normal non-distended stomach. Visualized small and large bowel is normal caliber, with no bowel wall thickening. Vascular/Lymphatic: Normal caliber abdominal aorta. Patent portal,  splenic, hepatic and renal veins. A few mildly enlarged left para-aortic nodes, largest 1.2 cm short axis diameter (series 18/image 66). Enlarged 1.5 cm high left retroperitoneal node anterior to the left diaphragmatic crus (series 18/image 37). Other: No abdominal ascites or focal fluid collection. Musculoskeletal: Enhancing inferior T9 vertebral 2.8 cm and right T10 vertebral 1.1 cm bone lesions (series 17/image 19). Enhancing 2.1 cm medial right iliac bone lesion (series 17/image 19). IMPRESSION: 1. Limited motion degraded scan. 2. Several (at least 5) hypoenhancing liver masses scattered throughout the liver, with MRI features compatible with metastatic disease. Percutaneous biopsy of the 1.5 cm segment 6 right liver or 1.3 cm anterior segment 4 left liver lesions could be considered. 3. Indeterminate enhancing bilateral adrenal nodules, measuring 2.5 cm on the right and 1.5 cm on the left. Metastatic disease is not excluded. 4. Left para-aortic and high left retroperitoneal lymphadenopathy suspicious for metastatic disease. 5. Enhancing T9 and T10 vertebral and medial right iliac bone lesions, suspicious for bone metastases. 6. Small right and trace left dependent pleural effusions. Geographic consolidation in the visualized lingula and right middle lobe, nonspecific, please see recent chest CT angiogram study for details. Electronically Signed   By: Ilona Sorrel M.D.   On: 05/18/2021 16:39   DG Chest Portable 1 View  Result Date: 06/07/2021 CLINICAL DATA:  Cough, shortness of breath. EXAM: PORTABLE CHEST 1 VIEW COMPARISON:  May 14, 2021. FINDINGS: Stable cardiomediastinal silhouette. Interval placement of right internal jugular Port-A-Cath with distal tip in expected position of the SVC. No pneumothorax is noted. Left upper lobe consolidation is noted concerning for pneumonia or atelectasis. Probable central pulmonary vascular congestion is noted. Bony thorax is unremarkable. IMPRESSION: Left upper lobe  opacity is noted concerning for pneumonia or atelectasis. Electronically Signed   By: Marijo Conception M.D.   On: 06/07/2021 12:13   ECHOCARDIOGRAM COMPLETE  Result Date: 06/08/2021    ECHOCARDIOGRAM REPORT   Patient Name:   WESTON KALLMAN Date of Exam: 06/08/2021 Medical Rec #:  [id number]      Height:       69.0 in Accession #:    [id number]     Weight:       220.7 lb Date of Birth:  [birth year]      BSA:          2.154 m Patient Age:    36 years       BP:           130/81 mmHg Patient Gender: M              HR:           108 bpm. Exam Location:  [facility] Procedure: 2D Echo, Cardiac Doppler, Color Doppler and Strain Analysis Indications:    CHF-acute diastolic W41.32  History:        Patient has no prior history of Echocardiogram examinations.                 COPD.  Sonographer:    Sherrie Sport Referring Phys: [id number] Ivor Costa Diagnosing      Kate Sable MD Phys:  Sonographer Comments: Suboptimal apical window. Global longitudinal strain was attempted. IMPRESSIONS  1. Left ventricular ejection fraction, by estimation, is 65 to 70%.  The left ventricle has normal function. The left ventricle has no regional wall motion abnormalities. Left ventricular diastolic parameters are consistent with Grade I diastolic dysfunction (impaired relaxation).  2. Right ventricular systolic function is normal. The right ventricular size is mildly enlarged.  3. A small pericardial effusion is present.  4. The mitral valve is normal in structure. No evidence of mitral valve regurgitation.  5. The aortic valve is tricuspid. Aortic valve regurgitation is not visualized. FINDINGS  Left Ventricle: Left ventricular ejection fraction, by estimation, is 65 to 70%. The left ventricle has normal function. The left ventricle has no regional wall motion abnormalities. Global longitudinal strain performed but not reported based on interpreter judgement due to suboptimal tracking. 3D left ventricular ejection fraction analysis performed but not  reported based on interpreter judgement due to suboptimal quality. The left ventricular internal cavity size was normal in size. There is no left ventricular hypertrophy. Left ventricular diastolic parameters are consistent with Grade I diastolic dysfunction (impaired relaxation). Right Ventricle: The right ventricular size is mildly enlarged. No increase in right ventricular wall thickness. Right ventricular systolic function is normal. Left Atrium: Left atrial size was normal in size. Right Atrium: Right atrial size was normal in size. Pericardium: A small pericardial effusion is present. Mitral Valve: The mitral valve is normal in structure. No evidence of mitral valve regurgitation. Tricuspid Valve: The tricuspid valve is grossly normal. Tricuspid valve regurgitation is trivial. Aortic Valve: The aortic valve is tricuspid. Aortic valve regurgitation is not visualized. Aortic valve mean gradient measures 6.0 mmHg. Aortic valve peak gradient measures 10.5 mmHg. Aortic valve area, by VTI measures 2.89 cm. Pulmonic Valve: The pulmonic valve was not well visualized. Pulmonic valve regurgitation is not visualized. Aorta: The aortic root is normal in size and structure. IAS/Shunts: No atrial level shunt detected by color flow Doppler.  LEFT VENTRICLE PLAX 2D LVIDd:         4.80 cm  Diastology LVIDs:         2.60 cm  LV e' medial:   1.96 cm/s LV PW:         1.30 cm  LV E/e' medial: 38.7 LV IVS:        1.20 cm LVOT diam:     2.10 cm LV SV:         76 LV SV Index:   35 LVOT Area:     3.46 cm 3D Volume EF:                         3D EF:        52 %                         LV EDV:       96 ml                         LV ESV:       46 ml                         LV SV:        50 ml RIGHT VENTRICLE RV S prime:     14.00 cm/s TAPSE (M-mode): 5.7 cm LEFT ATRIUM             Index       RIGHT ATRIUM  Index LA diam:        3.30 cm 1.53 cm/m  RA Area:     21.00 cm LA Vol (A2C):   64.2 ml 29.80 ml/m RA Volume:   55.60 ml   25.81 ml/m LA Vol (A4C):   31.2 ml 14.48 ml/m LA Biplane Vol: 45.4 ml 21.07 ml/m  AORTIC VALVE                    PULMONIC VALVE AV Area (Vmax):    2.39 cm     PV Vmax:       0.73 m/s AV Area (Vmean):   2.78 cm     PV Peak grad:  2.1 mmHg AV Area (VTI):     2.89 cm AV Vmax:           162.00 cm/s AV Vmean:          108.000 cm/s AV VTI:            0.261 m AV Peak Grad:      10.5 mmHg AV Mean Grad:      6.0 mmHg LVOT Vmax:         112.00 cm/s LVOT Vmean:        86.800 cm/s LVOT VTI:          0.218 m LVOT/AV VTI ratio: 0.84  AORTA Ao Root diam: 3.30 cm MITRAL VALVE                TRICUSPID VALVE MV Area (PHT): 3.95 cm     TR Peak grad:   30.0 mmHg MV Decel Time: 192 msec     TR Vmax:        274.00 cm/s MV E velocity: 75.80 cm/s MV A velocity: 119.00 cm/s  SHUNTS MV E/A ratio:  0.64         Systemic VTI:  0.22 m                             Systemic Diam: 2.10 cm Kate Sable MD Electronically signed by Kate Sable MD Signature Date/Time: 06/08/2021/3:10:53 PM    Final    [place] CORE BIOPSY (LYMPH NODES)  Result Date: 05/20/2021 INDICATION: Possible lung carcinoma by prior imaging with vague liver lesions and also enlarged left supraclavicular lymph node by CT. Due to oxygen requirement and poor respiratory status, biopsy of the left supraclavicular lymph node is performed without sedation as tolerance a liver biopsy currently with sedation would be borderline and potentially high risk. EXAM: ULTRASOUND GUIDED CORE BIOPSY OF LEFT SUPRACLAVICULAR LYMPH NODE MEDICATIONS: None. ANESTHESIA/SEDATION: None PROCEDURE: The procedure, risks, benefits, and alternatives were explained to the patient. Questions regarding the procedure were encouraged and answered. The patient understands and consents to the procedure. A time-out was performed prior to initiating the procedure. The left neck was prepped with chlorhexidine in a sterile fashion, and a sterile drape was applied covering the operative field. A sterile  gown and sterile gloves were used for the procedure. Local anesthesia was provided with 1% Lidocaine. After localizing an enlarged left supraclavicular lymph node, an 18 gauge core biopsy device was utilized in obtaining 4 separate core biopsy samples. Material was submitted in formalin. Additional ultrasound was performed. COMPLICATIONS: None immediate. FINDINGS: Several left supraclavicular and lower cervical lymph nodes are identified. The largest measures approximately 1.7 x 1.3 x 1.4 cm by ultrasound. Solid core biopsy samples were obtained from this lymph node. IMPRESSION: Ultrasound-guided  core biopsy performed enlarged left supraclavicular lymph node. Electronically Signed   By: Aletta Edouard M.D.   On: 05/20/2021 11:02   IR IMAGING GUIDED PORT INSERTION  Result Date: 06/03/2021 INDICATION: Metastatic lung cancer.  Chemotherapy. EXAM: IMPLANTED PORT A CATH PLACEMENT WITH ULTRASOUND AND FLUOROSCOPIC GUIDANCE MEDICATIONS: None ANESTHESIA/SEDATION: Moderate (conscious) sedation was employed during this procedure. A total of Versed 0 mg and Fentanyl 25 mcg was administered intravenously. Moderate Sedation Time: 20 minutes. The patient's level of consciousness and vital signs were monitored continuously by radiology nursing throughout the procedure under my direct supervision. FLUOROSCOPY TIME:  0 minutes, 24 seconds (4.5 mGy) COMPLICATIONS: None immediate. PROCEDURE: The procedure, risks, benefits, and alternatives were explained to the patient. Questions regarding the procedure were encouraged and answered. The patient understands and consents to the procedure. The neck and chest were prepped with chlorhexidine in a sterile fashion, and a sterile drape was applied covering the operative field. Maximum barrier sterile technique with sterile gowns and gloves were used for the procedure. A timeout was performed prior to the initiation of the procedure. Local anesthesia was provided with 1% lidocaine with  epinephrine. After creating a small venotomy incision, a micropuncture kit was utilized to access the internal jugular vein under direct, real-time ultrasound guidance. Ultrasound image documentation was performed. The microwire was kinked to measure appropriate catheter length. A subcutaneous port pocket was then created along the upper chest wall utilizing a combination of sharp and blunt dissection. The pocket was irrigated with sterile saline. A single lumen ISP power injectable port was chosen for placement. The 8 Fr catheter was tunneled from the port pocket site to the venotomy incision. The port was placed in the pocket. The external catheter was trimmed to appropriate length. At the venotomy, an 8 Fr peel-away sheath was placed over a guidewire under fluoroscopic guidance. The catheter was then placed through the sheath and the sheath was removed. Final catheter positioning was confirmed and documented with a fluoroscopic spot radiograph. The port was accessed with a Huber needle, aspirated and flushed with heparinized saline. The port pocket incision was closed with interrupted 3-0 Vicryl suture then Dermabond was applied, including at the venotomy incision. Dressings were placed. The patient tolerated the procedure well without immediate post procedural complication. IMPRESSION: Successful placement of a right internal jugular approach power injectable Port-A-Cath. The catheter is ready for immediate use. Michaelle Birks, MD [facility] Electronically Signed   By: Michaelle Birks M.D.   On: 06/03/2021 13:26       The results of significant diagnostics from this hospitalization (including imaging, microbiology, ancillary and laboratory) are listed below for reference.     Microbiology: Recent Results (from the past 240 hour(s))  Resp Panel by RT-PCR (Flu A&B, Covid) Nasopharyngeal Swab     Status: None   Collection Time: 06/07/21 11:21 AM    Specimen: Nasopharyngeal Swab; Nasopharyngeal(NP) swabs in vial transport medium  Result Value Ref Range Status   SARS Coronavirus 2 by RT PCR NEGATIVE NEGATIVE Final    Comment: (NOTE) SARS-CoV-2 target nucleic acids are NOT DETECTED.  The SARS-CoV-2 RNA is generally detectable in upper respiratory specimens during the acute phase of infection. The lowest concentration of SARS-CoV-2 viral copies this assay can detect is 138 copies/mL. A negative result does not preclude SARS-Cov-2 infection and should not be used as the sole basis for treatment or other patient management decisions. A negative result may occur with  improper specimen collection/handling, submission  of specimen other than nasopharyngeal swab, presence of viral mutation(s) within the areas targeted by this assay, and inadequate number of viral copies(<138 copies/mL). A negative result must be combined with clinical observations, patient history, and epidemiological information. The expected result is Negative.  Fact Sheet for Patients:  EntrepreneurPulse.com.au  Fact Sheet for Healthcare Providers:  IncredibleEmployment.be  This test is no t yet approved or cleared by the [place] FDA and  has been authorized for detection and/or diagnosis of SARS-CoV-2 by FDA under an Emergency Use Authorization (EUA). This EUA will remain  in effect (meaning this test can be used) for the duration of the COVID-19 declaration under Section 564(b)(1) of the Act, 21 U.S.C.section 360bbb-3(b)(1), unless the authorization is terminated  or revoked sooner.       Influenza A by PCR NEGATIVE NEGATIVE Final   Influenza B by PCR NEGATIVE NEGATIVE Final    Comment: (NOTE) The Xpert Xpress SARS-CoV-2/FLU/RSV plus assay is intended as an aid in the diagnosis of influenza from Nasopharyngeal swab specimens and should not be used as a sole basis for treatment. Nasal washings and aspirates are  unacceptable for Xpert Xpress SARS-CoV-2/FLU/RSV testing.  Fact Sheet for Patients: EntrepreneurPulse.com.au  Fact Sheet for Healthcare Providers: IncredibleEmployment.be  This test is not yet approved or cleared by the [place] FDA and has been authorized for detection and/or diagnosis of SARS-CoV-2 by FDA under an Emergency Use Authorization (EUA). This EUA will remain in effect (meaning this test can be used) for the duration of the COVID-19 declaration under Section 564(b)(1) of the Act, 21 U.S.C. section 360bbb-3(b)(1), unless the authorization is terminated or revoked.  Performed at [facility], [address] [place], [place] [address]   Blood culture (routine x 2)     Status: None (Preliminary result)   Collection Time: 06/07/21  1:01 PM   Specimen: BLOOD  Result Value Ref Range Status   Specimen Description BLOOD LEFT ANTECUBITAL  Final   Special Requests   Final    BOTTLES DRAWN AEROBIC AND ANAEROBIC Blood Culture results may not be optimal due to an inadequate volume of blood received in culture bottles   Culture   Final    NO GROWTH 2 DAYS Performed at [facility], [address], [place], [place] [address]    Report Status PENDING  Incomplete  Blood culture (routine x 2)     Status: None (Preliminary result)   Collection Time: 06/07/21  1:21 PM   Specimen: BLOOD  Result Value Ref Range Status   Specimen Description BLOOD BLOOD LEFT HAND  Final   Special Requests   Final    BOTTLES DRAWN AEROBIC ONLY Blood Culture results may not be optimal due to an inadequate volume of blood received in culture bottles   Culture   Final    NO GROWTH 2 DAYS Performed at [facility], [address], [place], [place] [address]    Report Status PENDING  Incomplete     Labs:  CBC: Recent Labs  Lab 06/07/21 1050 06/08/21 0728  WBC 10.1 11.8*  HGB 15.0 13.6  HCT 44.4 40.8  MCV 83.1 82.8  PLT 259  226   BMP &GFR Recent Labs  Lab 06/07/21 1050 06/08/21 0728  NA 135  --   K 4.3  --   CL 92*  --   CO2 28  --   GLUCOSE 103*  --   BUN 18  --   CREATININE 0.70  --  CALCIUM 9.6  --   MG  --  2.4   Estimated Creatinine Clearance: 108.9 mL/min (by C-G formula based on SCr of 0.7 mg/dL). Liver & Pancreas: Recent Labs  Lab 06/07/21 1050  AST 25  ALT 19  ALKPHOS 109  BILITOT 1.1  PROT 7.2  ALBUMIN 3.3*   Recent Labs  Lab 06/07/21 1050  LIPASE 32   No results for input(s): AMMONIA in the last 168 hours. Diabetic: No results for input(s): HGBA1C in the last 72 hours. No results for input(s): GLUCAP in the last 168 hours. Cardiac Enzymes: No results for input(s): CKTOTAL, CKMB, CKMBINDEX, TROPONINI in the last 168 hours. No results for input(s): PROBNP in the last 8760 hours. Coagulation Profile: Recent Labs  Lab 06/07/21 1809  INR 1.0   Thyroid Function Tests: No results for input(s): TSH, T4TOTAL, FREET4, T3FREE, THYROIDAB in the last 72 hours. Lipid Profile: No results for input(s): CHOL, HDL, LDLCALC, TRIG, CHOLHDL, LDLDIRECT in the last 72 hours. Anemia Panel: No results for input(s): VITAMINB12, FOLATE, FERRITIN, TIBC, IRON, RETICCTPCT in the last 72 hours. Urine analysis: No results found for: COLORURINE, APPEARANCEUR, LABSPEC, PHURINE, GLUCOSEU, HGBUR, BILIRUBINUR, KETONESUR, PROTEINUR, UROBILINOGEN, NITRITE, LEUKOCYTESUR Sepsis Labs: Invalid input(s): PROCALCITONIN, LACTICIDVEN   Time coordinating discharge: 35 minutes  SIGNED:  Mercy Riding, MD  [facility] 06/09/2021, 6:10 PM  If 7PM-7AM, please contact night-coverage [email or web address]

## 2021-06-09 NOTE — Progress Notes (Signed)
   06/08/21 1945  Assess: MEWS Score  Temp 98.2 F (36.8 C)  BP 122/73  Pulse Rate (!) 110  Resp (!) 26  SpO2 97 %  O2 Device HFNC  O2 Flow Rate (L/min) 4 L/min  Assess: MEWS Score  MEWS Temp 0  MEWS Systolic 0  MEWS Pulse 1  MEWS RR 2  MEWS LOC 0  MEWS Score 3  MEWS Score Color Yellow  Assess: if the MEWS score is Yellow or Red  Were vital signs taken at a resting state? Yes  Focused Assessment No change from prior assessment  Does the patient meet 2 or more of the SIRS criteria? Yes  Does the patient have a confirmed or suspected source of infection? No  MEWS guidelines implemented *See Row Information* No, previously yellow, continue vital signs every 4 hours  Treat  MEWS Interventions Other (Comment) (paged covering NP)  Pain Scale 0-10  Pain Score 0  Pain Intervention(s) Rest  Escalate  MEWS: Escalate Yellow: discuss with charge nurse/RN and consider discussing with provider and RRT  Notify: Charge Nurse/RN  Name of Charge Nurse/RN Notified Mendel Ryder, RN  Date Charge Nurse/RN Notified 06/08/21  Time Charge Nurse/RN Notified 2035  Notify: Provider  Provider Name/Title Randol Kern, NP  Date Provider Notified 06/08/21  Time Provider Notified 2035  Notification Type Page  Notification Reason Other (Comment) (Yellow MEWS)  Provider response No new orders;Other (Comment) (gave prn xanax per Randol Kern, NP)  Date of Provider Response 06/08/21  Time of Provider Response 2055  Document  Patient Outcome Other (Comment) (no change in patient condition or V/S)  Progress note created (see row info) Yes  Assess: SIRS CRITERIA  SIRS Temperature  0  SIRS Pulse 1  SIRS Respirations  1  SIRS WBC 0  SIRS Score Sum  2

## 2021-06-09 NOTE — Progress Notes (Signed)
SATURATION QUALIFICATIONS: (This note is used to comply with regulatory documentation for home oxygen)  Patient Saturations on Room Air at Rest = 94%  Patient Saturations on Room Air while Ambulating = 86%  Patient Saturations on 2 Liters of oxygen while Ambulating = 94%  Please briefly explain why patient needs home oxygen: Pt desaturates during ambulation without oxygen.

## 2021-06-10 ENCOUNTER — Encounter: Payer: Self-pay | Admitting: Oncology

## 2021-06-10 ENCOUNTER — Other Ambulatory Visit: Payer: Self-pay | Admitting: *Deleted

## 2021-06-10 ENCOUNTER — Institutional Professional Consult (permissible substitution): Payer: Self-pay | Admitting: Internal Medicine

## 2021-06-10 MED ORDER — ALPRAZOLAM 0.5 MG PO TABS: 0.5000 mg | ORAL_TABLET | Freq: Two times a day (BID) | ORAL | 0 refills | Status: DC | PRN

## 2021-06-10 NOTE — Telephone Encounter (Signed)
Pt requesting refill of xanax from previous hospital admission. States that it helped him rest better at night. Also will need to help for PET scan on Monday 9/19.

## 2021-06-12 LAB — CULTURE, BLOOD (ROUTINE X 2)
Culture: NO GROWTH
Culture: NO GROWTH

## 2021-06-13 ENCOUNTER — Other Ambulatory Visit: Payer: Self-pay

## 2021-06-13 ENCOUNTER — Ambulatory Visit
Admission: RE | Admit: 2021-06-13 | Discharge: 2021-06-13 | Disposition: A | Discharge status: Home / Self Care | Payer: Non-veteran care | Source: Ambulatory Visit | Attending: Oncology | Admitting: Oncology

## 2021-06-13 ENCOUNTER — Inpatient Hospital Stay
Admission: EM | Admit: 2021-06-13 | Discharge: 2021-07-02 | DRG: 190 | Disposition: A | Discharge status: Home / Self Care | Payer: Self-pay | Attending: Internal Medicine | Admitting: Internal Medicine

## 2021-06-13 ENCOUNTER — Emergency Department: Payer: Self-pay

## 2021-06-13 DIAGNOSIS — J441 Chronic obstructive pulmonary disease with (acute) exacerbation: Principal | ICD-10-CM

## 2021-06-13 DIAGNOSIS — R918 Other nonspecific abnormal finding of lung field: Secondary | ICD-10-CM | POA: Insufficient documentation

## 2021-06-13 DIAGNOSIS — E871 Hypo-osmolality and hyponatremia: Secondary | ICD-10-CM

## 2021-06-13 DIAGNOSIS — E041 Nontoxic single thyroid nodule: Secondary | ICD-10-CM | POA: Insufficient documentation

## 2021-06-13 DIAGNOSIS — R591 Generalized enlarged lymph nodes: Secondary | ICD-10-CM | POA: Insufficient documentation

## 2021-06-13 DIAGNOSIS — R0600 Dyspnea, unspecified: Secondary | ICD-10-CM

## 2021-06-13 DIAGNOSIS — K668 Other specified disorders of peritoneum: Secondary | ICD-10-CM

## 2021-06-13 DIAGNOSIS — T451X5A Adverse effect of antineoplastic and immunosuppressive drugs, initial encounter: Secondary | ICD-10-CM | POA: Diagnosis present

## 2021-06-13 DIAGNOSIS — E440 Moderate protein-calorie malnutrition: Secondary | ICD-10-CM

## 2021-06-13 DIAGNOSIS — D6959 Other secondary thrombocytopenia: Secondary | ICD-10-CM | POA: Diagnosis present

## 2021-06-13 DIAGNOSIS — K5900 Constipation, unspecified: Secondary | ICD-10-CM | POA: Diagnosis present

## 2021-06-13 DIAGNOSIS — D701 Agranulocytosis secondary to cancer chemotherapy: Secondary | ICD-10-CM | POA: Diagnosis present

## 2021-06-13 DIAGNOSIS — E669 Obesity, unspecified: Secondary | ICD-10-CM | POA: Diagnosis present

## 2021-06-13 DIAGNOSIS — R109 Unspecified abdominal pain: Secondary | ICD-10-CM

## 2021-06-13 DIAGNOSIS — I509 Heart failure, unspecified: Secondary | ICD-10-CM

## 2021-06-13 DIAGNOSIS — F1290 Cannabis use, unspecified, uncomplicated: Secondary | ICD-10-CM | POA: Diagnosis present

## 2021-06-13 DIAGNOSIS — R7989 Other specified abnormal findings of blood chemistry: Secondary | ICD-10-CM | POA: Diagnosis present

## 2021-06-13 DIAGNOSIS — I700 Atherosclerosis of aorta: Secondary | ICD-10-CM | POA: Insufficient documentation

## 2021-06-13 DIAGNOSIS — J690 Pneumonitis due to inhalation of food and vomit: Secondary | ICD-10-CM | POA: Diagnosis present

## 2021-06-13 DIAGNOSIS — R0602 Shortness of breath: Secondary | ICD-10-CM | POA: Diagnosis present

## 2021-06-13 DIAGNOSIS — I5032 Chronic diastolic (congestive) heart failure: Secondary | ICD-10-CM

## 2021-06-13 DIAGNOSIS — Z20822 Contact with and (suspected) exposure to covid-19: Secondary | ICD-10-CM | POA: Diagnosis present

## 2021-06-13 DIAGNOSIS — F411 Generalized anxiety disorder: Secondary | ICD-10-CM | POA: Diagnosis present

## 2021-06-13 DIAGNOSIS — R490 Dysphonia: Secondary | ICD-10-CM | POA: Diagnosis present

## 2021-06-13 DIAGNOSIS — K9420 Gastrostomy complication, unspecified: Secondary | ICD-10-CM

## 2021-06-13 DIAGNOSIS — E876 Hypokalemia: Secondary | ICD-10-CM

## 2021-06-13 DIAGNOSIS — C7951 Secondary malignant neoplasm of bone: Secondary | ICD-10-CM | POA: Diagnosis present

## 2021-06-13 DIAGNOSIS — C787 Secondary malignant neoplasm of liver and intrahepatic bile duct: Secondary | ICD-10-CM | POA: Insufficient documentation

## 2021-06-13 DIAGNOSIS — R1310 Dysphagia, unspecified: Secondary | ICD-10-CM

## 2021-06-13 DIAGNOSIS — R778 Other specified abnormalities of plasma proteins: Secondary | ICD-10-CM | POA: Diagnosis present

## 2021-06-13 DIAGNOSIS — C3492 Malignant neoplasm of unspecified part of left bronchus or lung: Secondary | ICD-10-CM | POA: Diagnosis present

## 2021-06-13 DIAGNOSIS — D696 Thrombocytopenia, unspecified: Secondary | ICD-10-CM

## 2021-06-13 DIAGNOSIS — J9382 Other air leak: Secondary | ICD-10-CM | POA: Diagnosis not present

## 2021-06-13 DIAGNOSIS — C3490 Malignant neoplasm of unspecified part of unspecified bronchus or lung: Secondary | ICD-10-CM

## 2021-06-13 DIAGNOSIS — Z79899 Other long term (current) drug therapy: Secondary | ICD-10-CM

## 2021-06-13 DIAGNOSIS — J9621 Acute and chronic respiratory failure with hypoxia: Secondary | ICD-10-CM | POA: Diagnosis present

## 2021-06-13 DIAGNOSIS — Z6830 Body mass index (BMI) 30.0-30.9, adult: Secondary | ICD-10-CM

## 2021-06-13 HISTORY — DX: Generalized anxiety disorder: F41.1

## 2021-06-13 HISTORY — DX: Chronic respiratory failure with hypoxia: J96.11

## 2021-06-13 LAB — TROPONIN I (HIGH SENSITIVITY): Troponin I (High Sensitivity): 59 ng/L — ABNORMAL HIGH (ref <18)

## 2021-06-13 LAB — CBC
HCT: 45.3 % (ref 39.0–52.0)
Hemoglobin: 15.4 g/dL (ref 13.0–17.0)
MCH: 28.8 pg (ref 26.0–34.0)
MCHC: 34 g/dL (ref 30.0–36.0)
MCV: 84.7 fL (ref 80.0–100.0)
Platelets: 266 10*3/uL (ref 150–400)
RBC: 5.35 MIL/uL (ref 4.22–5.81)
RDW: 12.5 % (ref 11.5–15.5)
WBC: 15.9 10*3/uL — ABNORMAL HIGH (ref 4.0–10.5)
nRBC: 0 % (ref 0.0–0.2)

## 2021-06-13 LAB — BASIC METABOLIC PANEL
Anion gap: 12 (ref 5–15)
BUN: 19 mg/dL (ref 8–23)
CO2: 31 mmol/L (ref 22–32)
Calcium: 9.5 mg/dL (ref 8.9–10.3)
Chloride: 92 mmol/L — ABNORMAL LOW (ref 98–111)
Creatinine, Ser: 0.81 mg/dL (ref 0.61–1.24)
GFR, Estimated: >60 mL/min (ref >60)
Glucose, Bld: 106 mg/dL — ABNORMAL HIGH (ref 70–99)
Potassium: 4.1 mmol/L (ref 3.5–5.1)
Sodium: 135 mmol/L (ref 135–145)

## 2021-06-13 LAB — RESP PANEL BY RT-PCR (FLU A&B, COVID) ARPGX2
Influenza A by PCR: NEGATIVE
Influenza B by PCR: NEGATIVE
SARS Coronavirus 2 by RT PCR: NEGATIVE

## 2021-06-13 LAB — BRAIN NATRIURETIC PEPTIDE: B Natriuretic Peptide: 23.1 pg/mL (ref 0.0–100.0)

## 2021-06-13 LAB — GLUCOSE, CAPILLARY: Glucose-Capillary: 122 mg/dL — ABNORMAL HIGH (ref 70–99)

## 2021-06-13 LAB — LACTIC ACID, PLASMA: Lactic Acid, Venous: 1.7 mmol/L (ref 0.5–1.9)

## 2021-06-13 MED ORDER — FUROSEMIDE 10 MG/ML IJ SOLN
60.0000 mg | Freq: Once | INTRAMUSCULAR | Status: AC
  Administered 2021-06-13: 60 mg via INTRAVENOUS
  Filled 2021-06-13: qty 8

## 2021-06-13 MED ORDER — GUAIFENESIN ER 600 MG PO TB12
600.0000 mg | ORAL_TABLET | Freq: Two times a day (BID) | ORAL | Status: DC
  Filled 2021-06-13: qty 1

## 2021-06-13 MED ORDER — IPRATROPIUM-ALBUTEROL 0.5-2.5 (3) MG/3ML IN SOLN
3.0000 mL | Freq: Once | RESPIRATORY_TRACT | Status: AC
  Administered 2021-06-13: 3 mL via RESPIRATORY_TRACT
  Filled 2021-06-13: qty 3

## 2021-06-13 MED ORDER — FLUDEOXYGLUCOSE F - 18 (FDG) INJECTION
11.4000 | Freq: Once | INTRAVENOUS | Status: AC | PRN
  Administered 2021-06-13: 12.01 via INTRAVENOUS

## 2021-06-13 MED ORDER — LORAZEPAM 2 MG/ML IJ SOLN
0.5000 mg | INTRAMUSCULAR | Status: DC | PRN
  Administered 2021-06-13 – 2021-06-26 (×10): 0.5 mg via INTRAVENOUS
  Filled 2021-06-13 (×11): qty 1

## 2021-06-13 MED ORDER — METHYLPREDNISOLONE SODIUM SUCC 125 MG IJ SOLR
125.0000 mg | Freq: Once | INTRAMUSCULAR | Status: AC
  Administered 2021-06-13: 125 mg via INTRAVENOUS
  Filled 2021-06-13: qty 2

## 2021-06-13 MED ORDER — ENSURE ENLIVE PO LIQD
237.0000 mL | Freq: Two times a day (BID) | ORAL | Status: DC
  Administered 2021-06-14: 237 mL via ORAL

## 2021-06-13 MED ORDER — LEVOFLOXACIN IN D5W 750 MG/150ML IV SOLN
750.0000 mg | INTRAVENOUS | Status: AC
Start: 2021-06-13 — End: 2021-06-17
  Administered 2021-06-13 – 2021-06-17 (×5): 750 mg via INTRAVENOUS
  Filled 2021-06-13 (×5): qty 150

## 2021-06-13 MED ORDER — BUSPIRONE HCL 5 MG PO TABS: 5.0000 mg | ORAL_TABLET | Freq: Three times a day (TID) | ORAL | Status: DC

## 2021-06-13 MED ORDER — ONDANSETRON HCL 4 MG/2ML IJ SOLN
4.0000 mg | Freq: Four times a day (QID) | INTRAMUSCULAR | Status: DC | PRN
  Administered 2021-06-19: 4 mg via INTRAVENOUS
  Filled 2021-06-13: qty 2

## 2021-06-13 MED ORDER — ACETAMINOPHEN 650 MG RE SUPP
650.0000 mg | Freq: Four times a day (QID) | RECTAL | Status: DC | PRN
  Filled 2021-06-13: qty 1

## 2021-06-13 MED ORDER — IPRATROPIUM-ALBUTEROL 0.5-2.5 (3) MG/3ML IN SOLN
3.0000 mL | Freq: Four times a day (QID) | RESPIRATORY_TRACT | Status: DC
  Administered 2021-06-14 – 2021-06-24 (×32): 3 mL via RESPIRATORY_TRACT
  Filled 2021-06-13 (×37): qty 3

## 2021-06-13 MED ORDER — ALBUTEROL SULFATE (2.5 MG/3ML) 0.083% IN NEBU
2.5000 mg | INHALATION_SOLUTION | RESPIRATORY_TRACT | Status: DC | PRN
  Administered 2021-06-13 – 2021-07-01 (×5): 2.5 mg via RESPIRATORY_TRACT
  Filled 2021-06-13 (×5): qty 3

## 2021-06-13 MED ORDER — ACETAMINOPHEN 325 MG PO TABS
650.0000 mg | ORAL_TABLET | Freq: Four times a day (QID) | ORAL | Status: DC | PRN
  Administered 2021-06-28: 650 mg via ORAL
  Filled 2021-06-13: qty 2

## 2021-06-13 MED ORDER — DIPHENHYDRAMINE HCL 25 MG PO CAPS: 25.0000 mg | ORAL_CAPSULE | Freq: Four times a day (QID) | ORAL | Status: DC | PRN

## 2021-06-13 MED ORDER — METHYLPREDNISOLONE SODIUM SUCC 125 MG IJ SOLR
80.0000 mg | Freq: Two times a day (BID) | INTRAMUSCULAR | Status: DC
  Administered 2021-06-13 – 2021-06-16 (×6): 80 mg via INTRAVENOUS
  Filled 2021-06-13 (×6): qty 2

## 2021-06-13 MED ORDER — ALPRAZOLAM 0.5 MG PO TABS: 0.5000 mg | ORAL_TABLET | Freq: Two times a day (BID) | ORAL | Status: DC | PRN

## 2021-06-13 MED ORDER — ASPIRIN EC 325 MG PO TBEC
325.0000 mg | DELAYED_RELEASE_TABLET | Freq: Once | ORAL | Status: DC
Start: 2021-06-13 — End: 2021-06-20
  Filled 2021-06-13: qty 1

## 2021-06-13 NOTE — ED Notes (Signed)
Pt on bedside in tri-pod position leaning over bedside table. MD aware and tx given

## 2021-06-13 NOTE — ED Provider Notes (Signed)
[facility] Emergency Department Provider Note  Time seen: 5:59 PM  I have reviewed the triage vital signs and the nursing notes.   HISTORY  Chief Complaint Shortness of Breath    HPI Derek Clarke is a 65 y.o. 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  In reviewing the patient's chart he was diagnosed with stage IV lung cancer, recently admitted to the hospital last week for worsening shortness of breath and cough.  At that time a CTA of the chest showed no PE but persistent left lung mass now with near total consolidation of right upper lobe indicating worsening cancer or postobstructive changes.  Patient wears 4 L of oxygen chronically.  States worsening shortness of breath despite this.  Currently satting 90% on 4 L of oxygen.  Patient had a PET scan performed today but came to the emergency department afterwards due to shortness of breath.  States shortness of breath started before the PET scan.  Denies any known fever.  States mild left-sided chest pain which has been fairly chronic.  No abdominal pain.   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 Authorizing Provider  albuterol (VENTOLIN HFA) 108 (90 Base) MCG/ACT inhaler Inhale 2 puffs into the lungs every 6 (six) hours as needed for wheezing or shortness of breath. 05/21/21   Lorella Nimrod, MD  ALPRAZolam Duanne Moron) 0.5 MG tablet Take 1 tablet (0.5 mg total) by mouth 2 (two) times daily as needed for anxiety. 06/10/21   Lloyd Huger, MD  busPIRone (BUSPAR) 5 MG tablet Take 1 tablet (5 mg total) by mouth 3 (three) times daily. 06/09/21   Mercy Riding, MD  chlorpheniramine (CHLOR-TRIMETON) 4 MG tablet Take 4 mg by mouth 2 (two) times daily as needed for allergies.    [provider]  feeding supplement (ENSURE ENLIVE / ENSURE PLUS) LIQD Take 237 mLs by mouth 2 (two) times daily between meals. 05/22/21   Lorella Nimrod, MD  fluticasone furoate-vilanterol (BREO ELLIPTA) 100-25 MCG/INH AEPB Inhale 1 puff into the lungs daily. 06/09/21   Mercy Riding, MD  guaiFENesin (MUCINEX) 600 MG 12 hr tablet Take 600 mg by mouth 2 (two) times daily as needed for cough or to loosen phlegm.    [provider]  ibuprofen (ADVIL) 400 MG tablet Take 200 mg by mouth every 4 (four) hours as needed for mild pain.    [provider]  lidocaine-prilocaine (EMLA) cream Apply to affected area once 05/27/21   Lloyd Huger, MD  Multiple Vitamin (MULTIVITAMIN WITH MINERALS) TABS tablet Take 1 tablet by mouth daily. 05/22/21   Lorella Nimrod, MD  ondansetron (ZOFRAN) 8 MG tablet Take 1 tablet (8 mg total) by mouth 2 (two) times daily as needed for refractory nausea / vomiting. 05/27/21   Lloyd Huger, MD  prochlorperazine (COMPAZINE) 10 MG tablet Take 1 tablet (10 mg total) by mouth every 6 (six) hours as needed (Nausea or vomiting). 05/27/21   Lloyd Huger, MD    No Known Allergies  Family History   Problem Relation Age of Onset   Lung disease Mother    Dementia Father     Social History Social History   Tobacco Use   Smoking status: Former    Packs/day: 1.00    Years: 30.00    Pack years: 30.00    Types: Cigarettes    Quit date: 03/26/2021    Years since quitting: 0.2   Smokeless tobacco: Never  Substance Use Topics   Alcohol use: Yes   Drug use: Yes    Types: Marijuana    Comment: quit approx. 5 years ago    Review of Systems Constitutional: Negative for fever. Cardiovascular: Negative for chest pain. Respiratory: Positive for shortness of breath and wet sounding cough. Gastrointestinal: Negative for abdominal pain, vomiting  Musculoskeletal: Negative for musculoskeletal complaints Neurological: Negative for headache All other ROS negative  ____________________________________________   PHYSICAL EXAM:  VITAL SIGNS: ED Triage Vitals  Enc Vitals Group     BP 06/13/21 1732 125/83     Pulse Rate 06/13/21 1732 (!) 102     Resp 06/13/21 1732 (!) 26     Temp 06/13/21 1732 97.7 F (36.5 C)     Temp Source 06/13/21 1732 Oral     SpO2 06/13/21 1732 91 %     Weight 06/13/21 1733 220 lb 7.4 oz (100 kg)     Height 06/13/21 1733 5\' 9"  (1.753 m)     Head Circumference --      Peak Flow --      Pain Score 06/13/21 1732 0     Pain Loc --      Pain Edu? --      Excl. in [place]? --    Constitutional: Alert and oriented. Well appearing and in no distress. Eyes: Normal exam ENT      Head: Normocephalic and atraumatic.      Mouth/Throat: Mucous membranes are moist. Cardiovascular: Normal rate, regular rhythm.  Respiratory: Mild tachypnea.  Increased work of breathing with bilateral rhonchi and wet sounding cough.  No sputum production. Gastrointestinal: Soft and nontender. No distention.  Musculoskeletal: Nontender with normal range of motion in all extremities. Neurologic:  Normal speech and language. No gross focal neurologic deficits  Skin:  Skin is warm, dry and  intact.  Psychiatric: Mood and affect are normal.   ____________________________________________    EKG  EKG viewed and interpreted by myself shows sinus tachycardia 102 bpm with a widened QRS, normal axis, slight QTC prolongation otherwise normal intervals with nonspecific ST changes and occasional PVC.  ____________________________________________    RADIOLOGY  Chest x-ray largely unchanged from prior  ____________________________________________   INITIAL IMPRESSION / ASSESSMENT AND PLAN / ED COURSE  Pertinent labs & imaging results that were available during my care of the patient were reviewed by me and considered in my medical decision making (see chart for details).   Patient  presents emergency department for worsening cough congestion shortness of breath.  Has known COPD as well as stage IV lung cancer on 4 L chronically, currently satting 90% on 4 L.  Patient has diffuse rhonchi on examination.  2+ pitting edema to bilateral lower extremities.  We will obtain labs including troponin and a BNP, chest x-ray and continue to closely monitor.  Patient receiving duo nebs.  Patient continues to feel short of breath despite breathing treatments.  White blood cell count is slightly elevated at 15.9.  Troponin is elevated at 59 which is significantly more elevated than baseline.  Given the patient's worsening shortness of breath we will dose Lasix, Solu-Medrol and admit to the hospitalist service.  Patient currently satting 90% on 4 L we will place on 5 L and reassess.  MURPHY DUZAN was evaluated in Emergency Department on 06/13/2021 for the symptoms described in the history of present illness. He was evaluated in the context of the global COVID-19 pandemic, which necessitated consideration that the patient might be at risk for infection with the SARS-CoV-2 virus that causes COVID-19. Institutional protocols and algorithms that pertain to the evaluation of patients at risk for COVID-19  are in a state of rapid change based on information released by regulatory bodies including the CDC and federal and state organizations. These policies and algorithms were followed during the patient's care in the ED.  ____________________________________________   FINAL CLINICAL IMPRESSION(S) / ED DIAGNOSES  Dyspnea COPD exacerbation CHF exacerbation   Harvest Dark, MD 06/13/21 1958

## 2021-06-13 NOTE — ED Triage Notes (Signed)
Pt to ED from PET scan for shob. Pt sounds very junky, having difficulty talking, trying to cough up phlegm. Wears 4L  at home Pt reports thinks had pnuemonia recently, does not think is taking antibiotics .

## 2021-06-13 NOTE — ED Notes (Signed)
Due to pt breathing and swallow assessment. I dont feel that po meds are safe. MD aware waiting for further instructions.Marland KitchenMarland KitchenMarland Kitchen

## 2021-06-13 NOTE — H&P (Signed)
History and Physical    PLEASE NOTE THAT DRAGON DICT[employer] SOFTWARE WAS USED IN THE CONSTRUCTION OF THIS NOTE.   Derek Clarke YIA:[id number] DOB: 06/30/1956 DOA: 06/13/2021  PCP: Pcp, No Patient coming from: home   I have personally briefly reviewed patient's old medical records in [place]  Chief Complaint: Shortness of breath  HPI: Derek Clarke is a 65 y.o. 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   The patient was recently hospitalized at [facility] from 06/07/2021 06/09/2021 for acute on chronic hypoxic respiratory failure in the setting of acute COPD exacerbation.  During his previous hospitalization, he underwent CTA chest on 06/24/2021 which was notable for no evidence of acute pulmonary embolism, but showed persistent left hilar mass, with interval increase in consolidation of the left upper lobe, felt to represent either progression of malignancy versus postobstructive changes, while showing trace bilateral pleural effusions.  He also underwent echocardiogram on 06/08/2021 notable for LVEF 65 to 70%, no focal wall motion maladies, will showing grade 1 diastolic dysfunction, mildly enlarged right ventricle, and small pericardial effusion in the absence of any significant valvular pathology.  The patient's shortness of breath improved over the course of his hospitalization with scheduled duo nebulizers and systemic corticosteroids, with the patient subsequently discharged to home on his baseline 4 L nasal cannula.  He was also discharged on a 3-day course of prednisone as well as a Z-Pak.  In terms of modifications to his home respiratory regimen that were made  during this previous hospitalization, he was instructed to stop taking his home Pulmicort and initiate Breo Ellipta.  The patient presents back to [facility] ED today complaining of 1 to 2 days of progressive shortness of breath, after noting some initial continued improvement in his respiratory status following discharge from the hospital on 06/09/2021.  He reports that he compliantly completed his 3-day course of prednisone 50 mg p.o. daily, and noted that his worsening shortness of breath started 1 to 2 days of completing the steroid course.  He also conveys that he has not yet picked up his new prescription for [facility].  He notes chronic baseline nonproductive cough in the absence of any hemoptysis.  Denies any associated wheezing.  Also denies any recent orthopnea, PND.  Reports increase in edema in the bilateral lower extremities over the last 3 to 4 days, but denies any associated calf tenderness or new lower extremity erythema.  Denies any associated chest pain, palpitations, diaphoresis, nausea, vomiting.  Denies any associated subjective fever, chills, rigors, or generalized myalgias.  In the setting of his stage IV adenocarcinoma of the left lung with liver metastasis, undergoing chemotherapy, the patient underwent PET scan earlier today, with associated read currently pending.  He confirms that he is a former smoker, having quit smoking in July 2022 after a prolonged smoking history.  Per chart review, notable prior labs include white blood cell count on previous day of admission, 06/07/2021, noted to be 10.1, with interval increase to 11.8 on 06/08/2021 following interval initiation of steroids.    ED Course:  Vital signs in the ED were notable for the following: Afebrile; heart rate 96-1 08; blood pressure 99/60-127/71, with most recent blood pressure  noted to be 117/65; respiratory rate 18-26, oxygen saturation initially 89 to 90% on baseline 4 L nasal cannula, with ensuing improvement to 94 to  95% on 6 L nasal cannula.  Labs were notable for the following: BMP was notable for the following: Sodium 135, bicarbonate 31, creatinine 0.81.  BNP 23 compared to 17 on 06/07/2021.  High-sensitivity troponin high found to be 59, relative to most recent prior value of 9 on 06/07/2021.  CBC notable for white blood cell count of 15,900, hemoglobin 15.4.  Lactic acid 1.7.  Screening COVID-19/influenza PCR were checked in the ED today, with result currently pending.  Imaging and additional notable ED work-up: EKG, in comparison to most recent prior from 06/10/2021 showed sinus tachycardia with frequent PVCs, ventricular rate 102, right bundle branch block, showed T wave inversion in leads V1, V2, and V3, of which T wave inversion in V1 and V3 were unchanged relative to most recent prior EKG, and showed no evidence of ST changes, including no evidence of ST elevation.  Chest x-ray, in comparison to CXR from 06/07/2021 showed no significant interval changes.  While in the ED, the following were administered: Solu-Medrol 125 mg IV x1, duo nebulizer treatment, Lasix 60 mg IV x1.  Subsequently, the patient was admitted for further evaluation and management of acute on chronic hypoxic respiratory failure.      Review of Systems: As per HPI otherwise 10 point review of systems negative.   Past Medical History:  Diagnosis Date   Adenocarcinoma ([place])    lung;   COPD (chronic obstructive pulmonary disease) ([place])     Past Surgical History:  Procedure Laterality Date   IR IMAGING GUIDED PORT INSERTION  06/03/2021    Social History:  reports that he quit smoking about 2 months ago. His smoking use included cigarettes. He has a 30.00 pack-year smoking history. He has never used smokeless tobacco. He reports current alcohol use. He reports current drug use. Drug: Marijuana.   No Known Allergies  Family History  Problem Relation Age of Onset   Lung disease Mother    Dementia Father     Family history  reviewed and not pertinent    Prior to Admission medications   Medication Sig Start Date End Date Taking? Authorizing Provider  albuterol (VENTOLIN HFA) 108 (90 Base) MCG/ACT inhaler Inhale 2 puffs into the lungs every 6 (six) hours as needed for wheezing or shortness of breath. 05/21/21   Lorella Nimrod, MD  ALPRAZolam Duanne Moron) 0.5 MG tablet Take 1 tablet (0.5 mg total) by mouth 2 (two) times daily as needed for anxiety. 06/10/21   Lloyd Huger, MD  busPIRone (BUSPAR) 5 MG tablet Take 1 tablet (5 mg total) by mouth 3 (three) times daily. 06/09/21   Mercy Riding, MD  chlorpheniramine (CHLOR-TRIMETON) 4 MG tablet Take 4 mg by mouth 2 (two) times daily as needed for allergies.    [provider]  feeding supplement (ENSURE ENLIVE / ENSURE PLUS) LIQD Take 237 mLs by mouth 2 (two) times daily between meals. 05/22/21   Lorella Nimrod, MD  fluticasone furoate-vilanterol (BREO ELLIPTA) 100-25 MCG/INH AEPB Inhale 1 puff into the lungs daily. 06/09/21   Mercy Riding, MD  guaiFENesin (MUCINEX) 600 MG 12 hr tablet Take 600 mg by mouth 2 (two) times daily as needed for cough or to loosen phlegm.    [provider]  ibuprofen (ADVIL) 400 MG tablet Take 200 mg by mouth every 4 (four) hours as needed for mild  pain.    [provider]  lidocaine-prilocaine (EMLA) cream Apply to affected area once 05/27/21   Lloyd Huger, MD  Multiple Vitamin (MULTIVITAMIN WITH MINERALS) TABS tablet Take 1 tablet by mouth daily. 05/22/21   Lorella Nimrod, MD  ondansetron (ZOFRAN) 8 MG tablet Take 1 tablet (8 mg total) by mouth 2 (two) times daily as needed for refractory nausea / vomiting. 05/27/21   Lloyd Huger, MD  prochlorperazine (COMPAZINE) 10 MG tablet Take 1 tablet (10 mg total) by mouth every 6 (six) hours as needed (Nausea or vomiting). 05/27/21   Lloyd Huger, MD     Objective    Physical Exam: Vitals:   06/13/21 1732 06/13/21 1733 06/13/21 1800 06/13/21 1932  BP: 125/83   112/73 127/71  Pulse: (!) 102  (!) 108 98  Resp: (!) 26  (!) 25 19  Temp: 97.7 F (36.5 C)     TempSrc: Oral     SpO2: 91%  92% 94%  Weight:  100 kg    Height:  $Remove'5\' 9"'goKyVNy$  (1.753 m)      General: appears to be stated age; alert, oriented; mildly increased wob noted Skin: warm, dry, no rash Head:  AT/[place] Mouth:  Oral mucosa membranes appear dry, normal dentition Neck: supple; trachea midline Heart:  mildly tachycardic, but regular; did not appreciate any M/R/G Lungs: Bilateral rhonchi noted in the absence of any wheezes. Abdomen: + BS; soft, ND, NT Vascular: 2+ pedal pulses b/l; 2+ radial pulses b/l Extremities: Trace edema in the bilateral lower extremities, no muscle wasting Neuro: strength and sensation intact in upper and lower extremities b/l    Labs on Admission: I have personally reviewed following labs and imaging studies  CBC: Recent Labs  Lab 06/07/21 1050 06/08/21 0728 06/13/21 1750  WBC 10.1 11.8* 15.9*  HGB 15.0 13.6 15.4  HCT 44.4 40.8 45.3  MCV 83.1 82.8 84.7  PLT 259 226 833   Basic Metabolic Panel: Recent Labs  Lab 06/07/21 1050 06/08/21 0728 06/13/21 1750  NA 135  --  135  K 4.3  --  4.1  CL 92*  --  92*  CO2 28  --  31  GLUCOSE 103*  --  106*  BUN 18  --  19  CREATININE 0.70  --  0.81  CALCIUM 9.6  --  9.5  MG  --  2.4  --    GFR: Estimated Creatinine Clearance: 107.4 mL/min (by C-G formula based on SCr of 0.81 mg/dL). Liver Function Tests: Recent Labs  Lab 06/07/21 1050  AST 25  ALT 19  ALKPHOS 109  BILITOT 1.1  PROT 7.2  ALBUMIN 3.3*   Recent Labs  Lab 06/07/21 1050  LIPASE 32   No results for input(s): AMMONIA in the last 168 hours. Coagulation Profile: Recent Labs  Lab 06/07/21 1809  INR 1.0   Cardiac Enzymes: No results for input(s): CKTOTAL, CKMB, CKMBINDEX, TROPONINI in the last 168 hours. BNP (last 3 results) No results for input(s): PROBNP in the last 8760 hours. HbA1C: No results for input(s): HGBA1C in the  last 72 hours. CBG: Recent Labs  Lab 06/13/21 1347  GLUCAP 122*   Lipid Profile: No results for input(s): CHOL, HDL, LDLCALC, TRIG, CHOLHDL, LDLDIRECT in the last 72 hours. Thyroid Function Tests: No results for input(s): TSH, T4TOTAL, FREET4, T3FREE, THYROIDAB in the last 72 hours. Anemia Panel: No results for input(s): VITAMINB12, FOLATE, FERRITIN, TIBC, IRON, RETICCTPCT in the last 72 hours. Urine analysis: No results  found for: COLORURINE, APPEARANCEUR, LABSPEC, [place], GLUCOSEU, HGBUR, BILIRUBINUR, KETONESUR, PROTEINUR, UROBILINOGEN, NITRITE, LEUKOCYTESUR  Radiological Exams on Admission: DG Chest Port 1 View  Result Date: 06/13/2021 CLINICAL DATA:  Shortness of breath.  Recent pneumonia. EXAM: PORTABLE CHEST 1 VIEW COMPARISON:  Radiograph and chest CT 06/07/2021. FINDINGS: Right chest port remains in place. Similar volume loss in the left hemithorax with diffusely increased opacity, opacity throughout the left upper lobe with seen on recent CT. Opacity at the right lung base corresponds to right middle lobe collapse. Background bronchial thickening. Trace pleural effusions on CT are not well demonstrated by radiograph. No pneumothorax or significant change from prior imaging. Heart size is grossly stable. IMPRESSION: 1. No significant change from prior imaging. 2. Volume loss in the left hemithorax with diffusely increased opacity throughout the left upper lung zone, upper lobe consolidation seen on recent CT. 3. Right lung base opacity corresponds to right middle lobe collapse. 4. Background bronchial thickening. Electronically Signed   By: Keith Rake M.D.   On: 06/13/2021 18:36     EKG: Independently reviewed, with result as described above.    Assessment/Plan   Derek Clarke is a 65 y.o. 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    Principal Problem:   Acute on chronic respiratory failure with hypoxia (HCC) Active Problems:   COPD with acute exacerbation (HCC)   SOB (shortness of breath)   Elevated troponin   Dysphagia   GAD (generalized anxiety disorder)     #) Acute on chronic hypoxic respiratory failure: In the setting of a history of chronic hypoxic respiratory failure on 4 L continuous nasal cannula, presenting oxygen saturation noted to be in the high 80s on baseline 4 L [place], with subsequent improvement into the mid 90s on 6 L nasal cannula.  Suspect that this is multifactorial, with suspected contribution from acute COPD exacerbation, with potential for systemic steroid dependence given the patient's worsening shortness of breath started following completion of discharge course of prednisone.  Differential also includes the possibility of worsening lung cancer, including potential impacts of associated left hilar mass, with most recent prior CTA chest showing evidence of consolidation of the left upper lobe thought to represent either progression of underlying malignancy versus postobstructive changes.  In trying to differentiate between these possibilities as they relate to presenting acute on chronic hypoxic respiratory failure, it is notable that the patient underwent PET scan as an outpatient this morning, with results currently pending.   In the context of this known left hilar mass with possibility of postobstructive changes on prior CTA chest, differential also includes the possibility of postobstructive pneumonia.  However, clinically, patient's presentation appears less suggestive of an underlying infectious process, denying any associated subjective fever, chills, rigors, or generalized myalgias.  Additionally, he is  objectively afebrile in the emergency department this evening.  While presenting with blood cell count is mildly elevated at 15,900, it is noted that the patient's white blood cell count was previously trending up while on steroids, with this evening's leukocytosis potentially on the basis of an continued upward trend in this value with interval use of prednisone, as further detailed above.  Therefore, in the absence of objective fever,  SIRS criteria are not met at this time for sepsis. Will add on procalcitonin and closely monitor for results of PET scan to further evaluate for underlying postobstructive pneumonia.  In the setting of suspected acute COPD exacerbation and associated benefit of reduction in duration of hospitalization duration with use of antibiotics, will start Levaquin, as the patient has already completed a course of azithromycin.   Consider the possibility of acute on chronic diastolic heart failure, including in the setting of patient's report of recent increase in bilateral lower extremity edema.  However, clinically acutely decompensated heart failure appears less likely at this time, with nonelevated BNP relative to most recent prior, and alternate explanation for his recent extremity edema on the basis of side effect of corticosteroid course.  The patient received a dose of IV Lasix in the ED this evening, as noted above. It hold off on additional IV Lasix for now, but will closely monitor ensuing urine output and overall volume status and correlate with clinical course to further determine presence of underlying contribution from acute on chronic diastolic heart failure.  In the potential indication for conservative approach to additional diuresis includes the pericardial effusion identified on most recent prior echocardiogram on 06/08/2021, particularly given increased risk for interval worsening of pericardial effusion given underlying malignancy. Will closely monitor ensuing bp and  volume status.   ACS also appears less likely at this time, in the absence of any associated chest pain while EKG shows no evidence of acute ischemic changes, including no evidence of ST elevation.  Mildly elevated initial troponin is felt to be more consistent with supply demand mismatch in the setting of acute on chronic hypoxic respiratory failure, as further detailed below.  Additionally, acute pulmonary embolism appears clinically less likely at this time given that CTA chest performed less than 1 week ago demonstrated no evidence of acute pulmonary embolism.  Of note, COVID-19/influenza PCR were checked in the ED today, with results currently pending.    Plan: Further evaluation and management of suspected attribution from acute COPD exacerbation, including Solu-Medrol, scheduled duo nebulizer treatments (albuterol nebulizer, and initiation of Levaquin, as further detailed above.  Check VBG.  Add on serum phosphorus and magnesium levels.  Check procalcitonin level.  Follow for results of COVID-19/influenza PCR checked in the ED this evening.  Following for result of today's PET scan, as detailed above.  Repeat CBC in the morning.  Monitor on continuous pulse oximetry.  Monitor strict I's and O's and daily weights.  Further trending of troponin, as further detailed below.      #) Acute COPD exacerbation: 1 to 2 days of progressive shortness of breath associated with increased work of breathing, and felt to be a contributing factor leading to presenting acute on chronic hypoxic respiratory failure, as above, potentially as a consequence of systemic corticosteroid dependence, as further discussed above.  Further monitoring for any contributory postobstructive pneumonia, as detailed above.  Patient confirms that he is a former smoker, having completely quit smoking in July 2022.  Plan: Solumedrol, scheduled duo nebulizers, as needed albuterol nebulizers.  Start Levaquin, as above.  Check VBG.  Check  serum phosphorus level.  Monitor on continuous pulse oximetry.  Add on procalcitonin.  Follow-up result of today's PET scan.  Repeat BMP in the morning.      #) Elevated troponin: mildly elevated initial troponin of 58, increased from a value of 9 on 06/07/2021.  Suspect that this mildly elevated troponin is on the basis of supply  demand mismatch in the setting of diminished oxygen delivery capacity in the setting of presenting acute on chronic hypoxic respiratory failure due to acute COPD exacerbation , as further detailed above, as opposed to representing a type I process due to acute plaque rupture. EKG shows no evidence of acute ischemic changes, including no evidence of STEMI, and CXR shows no interval change relative to most recent prior plain films of the chest, as further detailed above, including no evidence of pneumothorax.  Additionally, presentation is not associated with any CP.  Overall, ACS is felt to be less likely relative to type 2 supply demand mismatch, as above, but will closely monitor on telemetry overnight while treating suspected underlying acute on chronic hypoxic respiratory failure with suspected contribution from acute COPD exacerbation, interval progression in lung cancer the results of today's PET scan, as further described above.    Plan: Continue to trend troponin overnight . Monitor on telemetry. PRN EKG for development of chest pain.  Check serum Mg level and repeat BMP in the morning, with prn supplementation to maintain Mg and potassium levels greater than or equal to 2.0 and 4.0, respectively, to further reduce risk of ventricular arrhythmia. Repeat CBC in the AM. Additional evaluation and management of presenting acute on chronic hypoxic respiratory failure as suspected driving force behind mildly elevated troponin, as above.        #) Dysphagia: Of note, the patient failed his nursing bedside swallow screen in the emergency department.  No known prior history  of dysphagia, although there may be some increased risk for development of such on the basis of external compressive force in the setting of known left hilar mass.  We will closely monitor for today's PET scan results to further assess.   Plan: NPO for now.  ST consult for swallow evaluation placed for the morning.  Follow-up results of today's PET scan, as further detailed above.      #) Generalized anxiety disorder: BuSpar as well as as needed Xanax as an outpatient.  Plan: In the setting of current n.p.o. status as a consequence of failed nursing bedside swallow screen, will hold home BuSpar and Xanax for now.  Rather, I ordered as needed IV Ativan for anxiety.       DVT prophylaxis: scd's  Code Status: Full code Family Communication: none Disposition Plan: Per Rounding Team Consults called: none;  Admission status: Inpatient; PCU     Of note, this patient was added by me to the following Admit List/Treatment Team: armcadmits.    Of note, the Adult Admission Order Set (Multimorbid order set) was used by me in the admission process for this patient.   PLEASE NOTE THAT DRAGON DICTATION SOFTWARE WAS USED IN THE CONSTRUCTION OF THIS NOTE.   [place] [facility] Pager [phone] From [place]  Otherwise, please contact night-coverage  [email or web address] Password [email or web address]   06/13/2021, 8:20 PM

## 2021-06-13 NOTE — Progress Notes (Deleted)
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  INTERVAL HISTORY: Patient returns to clinic today for hospital follow-up, discussion of his final pathology results, and treatment planning.  He continues to be significantly short of breath with an oxygen requirement of 4 L as well as mildly somnolent likely secondary to hypercapnia.  He is anxious, but otherwise feels well.  He has no neurologic complaints.  He denies any recent fevers.  He has a fair appetite, but denies weight loss.  He has no chest pain or hemoptysis.  He continues to have chronic cough and secretions.  He has no nausea, vomiting, constipation, or diarrhea.  He has no urinary complaints.  Patient feels generally terrible, but offers no further specific complaints today.  REVIEW OF SYSTEMS:   Review of Systems  Constitutional:  Positive for malaise/fatigue. Negative for fever and weight loss.  Respiratory:  Positive for cough, sputum production and shortness of breath. Negative for hemoptysis.   Cardiovascular: Negative.  Negative for chest pain and leg swelling.  Gastrointestinal: Negative.  Negative for abdominal pain.  Genitourinary: Negative.  Negative for dysuria and hematuria.  Musculoskeletal: Negative.  Negative for back pain.  Skin: Negative.  Negative for rash.  Neurological:  Positive for weakness. Negative for dizziness, focal weakness and headaches.  Psychiatric/Behavioral:  The patient is nervous/anxious.    As per HPI. Otherwise, a complete review of systems is negative.  PAST MEDICAL HISTORY: Past Medical History:  Diagnosis Date   Adenocarcinoma ([place])    lung;   COPD (chronic obstructive pulmonary disease) ([place])     PAST SURGICAL  HISTORY: Past Surgical History:  Procedure Laterality Date   IR IMAGING GUIDED PORT INSERTION  06/03/2021    FAMILY HISTORY: Family History  Problem Relation Age of Onset   Lung disease Mother    Dementia Father     ADVANCED DIRECTIVES (Y/N):  N  HEALTH MAINTENANCE: Social History   Tobacco Use   Smoking status: Former    Packs/day: 1.00    Years: 30.00    Pack years: 30.00    Types: Cigarettes    Quit date: 03/26/2021    Years since quitting: 0.2   Smokeless tobacco: Never  Substance Use Topics   Alcohol use: Yes   Drug use: Yes    Types: Marijuana    Comment: quit approx. 5 years ago     Colonoscopy:  PAP:  Bone density:  Lipid panel:  No Known Allergies  Current Outpatient Medications  Medication Sig Dispense Refill   albuterol (VENTOLIN HFA) 108 (90 Base) MCG/ACT inhaler Inhale 2 puffs into the lungs every 6 (six) hours as needed for wheezing or shortness of breath. 8 g 2   ALPRAZolam (XANAX) 0.5 MG tablet Take 1 tablet (0.5 mg total) by mouth 2 (two) times daily as needed for anxiety. 60 tablet 0   busPIRone (BUSPAR) 5 MG tablet Take 1 tablet (5 mg total) by mouth 3 (three) times daily. 90 tablet 1   chlorpheniramine (CHLOR-TRIMETON) 4 MG tablet Take 4 mg by mouth 2 (two) times daily as needed for allergies.     feeding supplement (ENSURE ENLIVE / ENSURE PLUS) LIQD Take 237 mLs by mouth 2 (two) times daily between meals. 237 mL 12   fluticasone  furoate-vilanterol (BREO ELLIPTA) 100-25 MCG/INH AEPB Inhale 1 puff into the lungs daily. 1 each 1   guaiFENesin (MUCINEX) 600 MG 12 hr tablet Take 600 mg by mouth 2 (two) times daily as needed for cough or to loosen phlegm.     ibuprofen (ADVIL) 400 MG tablet Take 200 mg by mouth every 4 (four) hours as needed for mild pain.     lidocaine-prilocaine (EMLA) cream Apply to affected area once 30 g 3   Multiple Vitamin (MULTIVITAMIN WITH MINERALS) TABS tablet Take 1 tablet by mouth daily. 90 tablet 1   ondansetron (ZOFRAN) 8  MG tablet Take 1 tablet (8 mg total) by mouth 2 (two) times daily as needed for refractory nausea / vomiting. 60 tablet 2   prochlorperazine (COMPAZINE) 10 MG tablet Take 1 tablet (10 mg total) by mouth every 6 (six) hours as needed (Nausea or vomiting). 60 tablet 2   No current facility-administered medications for this visit.    OBJECTIVE: There were no vitals filed for this visit.    There is no height or weight on file to calculate BMI.    ECOG FS:2 - Symptomatic, <50% confined to bed  General: Well-developed, well-nourished, mild respiratory distress. Eyes: Pink conjunctiva, anicteric sclera. HEENT: Normocephalic, moist mucous membranes. Lungs: Diminished breath sounds. Heart: Regular rate and rhythm. Abdomen: Soft, nontender, no obvious distention. Musculoskeletal: No edema, cyanosis, or clubbing. Neuro: Alert, answering all questions appropriately. Cranial nerves grossly intact. Skin: No rashes or petechiae noted. Psych: Normal affect. Lymphatics: No cervical, calvicular, axillary or inguinal LAD.   LAB RESULTS:  Lab Results  Component Value Date   NA 135 06/07/2021   K 4.3 06/07/2021   CL 92 (L) 06/07/2021   CO2 28 06/07/2021   GLUCOSE 103 (H) 06/07/2021   BUN 18 06/07/2021   CREATININE 0.70 06/07/2021   CALCIUM 9.6 06/07/2021   PROT 7.2 06/07/2021   ALBUMIN 3.3 (L) 06/07/2021   AST 25 06/07/2021   ALT 19 06/07/2021   ALKPHOS 109 06/07/2021   BILITOT 1.1 06/07/2021   GFRNONAA >60 06/07/2021    Lab Results  Component Value Date   WBC 11.8 (H) 06/08/2021   NEUTROABS 8.4 (H) 05/14/2021   HGB 13.6 06/08/2021   HCT 40.8 06/08/2021   MCV 82.8 06/08/2021   PLT 226 06/08/2021     STUDIES: DG Chest 1 View  Result Date: 05/14/2021 CLINICAL DATA:  Shortness of breath for couple of months. EXAM: CHEST  1 VIEW COMPARISON:  None. FINDINGS: Mild cardiac enlargement. Perihilar infiltrates bilaterally may represent edema or multifocal pneumonia. No pleural effusions.  No pneumothorax. Mediastinal contours appear intact. IMPRESSION: Bilateral perihilar infiltrates. Electronically Signed   By: Lucienne Capers M.D.   On: 05/14/2021 23:11   CT Angio Chest PE W and/or Wo Contrast  Result Date: 06/07/2021 CLINICAL DATA:  Tachycardia, vomiting, metastatic lung cancer EXAM: CT ANGIOGRAPHY CHEST WITH CONTRAST TECHNIQUE: Multidetector CT imaging of the chest was performed using the standard protocol during bolus administration of intravenous contrast. Multiplanar CT image reconstructions and MIPs were obtained to evaluate the vascular anatomy. CONTRAST:  44m OMNIPAQUE IOHEXOL 350 MG/ML SOLN COMPARISON:  06/08/2011, 05/15/2021, FINDINGS: Cardiovascular: This is a technically adequate evaluation of the pulmonary vasculature. No filling defects or pulmonary emboli. Stable left hilar soft tissue mass encasing the left upper and left lower pulmonary arteries. Stable trace pericardial effusion. The heart is not enlarged. No evidence of thoracic aortic aneurysm or dissection. Mild atherosclerosis of the aorta and coronary vessels. Mediastinum/Nodes: There  is segmental wall thickening of the mid to distal thoracic esophagus. Confluent soft tissue within the mediastinum and bilateral hila consistent with adenopathy, not grossly changed since prior study. An enlarged left supraclavicular lymph node on image 9/4 measures 13 mm in short axis, unchanged. Thyroid and trachea are unremarkable. Lungs/Pleura: There are trace bilateral pleural effusions. Since the prior exam, there is progressive near complete consolidation of the left upper lobe, which may reflect progressive disease and/or postobstructive change. There is diffuse bilateral bronchial wall thickening, greatest at the lung bases. Continued right middle lobe collapse with occlusion of the right middle lobe bronchus again noted. Upper Abdomen: There are stable bilateral adrenal nodules, measuring up to 2.2 cm on the right. Multiple  enlarged lymph nodes are again seen within the central upper abdomen, largest measuring 1.7 cm just posterior to the gastric cardia. Multiple hypodensities are again seen within the liver consistent with metastatic disease, grossly unchanged since prior study. Musculoskeletal: Stable sclerotic focus within the left T10 transverse process and within the T9 vertebral body, compatible with bony metastases. No acute displaced fractures. Reconstructed images demonstrate no additional findings. Review of the MIP images confirms the above findings. IMPRESSION: 1. No evidence of pulmonary embolus. 2. Persistent left hilar mass surrounding the left hilar vessels, with increasing near total consolidation of the left upper lobe since prior study. This may be progression of malignancy versus postobstructive change. 3. Continued coalescing lymphadenopathy throughout the mediastinum and bilateral hila. 4. Bilateral bronchial wall thickening, greatest at the lung bases. 5. Trace bilateral pleural effusions. 6. Segmental wall thickening of the mid to distal thoracic esophagus, which could be inflammatory or infectious. 7. Upper abdominal metastases with continued multiple liver lesions, bilateral adrenal nodularity, and upper abdominal adenopathy. Electronically Signed   By: Randa Ngo M.D.   On: 06/07/2021 15:12   CT Angio Chest PE W/Cm &/Or Wo Cm  Result Date: 05/15/2021 CLINICAL DATA:  Hemoptysis EXAM: CT ANGIOGRAPHY CHEST WITH CONTRAST TECHNIQUE: Multidetector CT imaging of the chest was performed using the standard protocol during bolus administration of intravenous contrast. Multiplanar CT image reconstructions and MIPs were obtained to evaluate the vascular anatomy. CONTRAST:  140m OMNIPAQUE IOHEXOL 350 MG/ML SOLN COMPARISON:  None. FINDINGS: Cardiovascular: Contrast injection is sufficient to demonstrate satisfactory opacification of the pulmonary arteries to the segmental level. There is no pulmonary embolus or  evidence of right heart strain. The size of the main pulmonary artery is normal. Proximal left pulmonary arteries are severely narrowed by confluent soft tissue surrounding the left hilum. The course and caliber of the aorta are normal. There is atherosclerotic calcification. Opacification decreased due to pulmonary arterial phase contrast bolus timing. There is a small pericardial effusion. Mediastinum/Nodes: There is bulky abnormal mediastinal soft tissue with in case mint of the left common carotid artery and proximal left subclavian artery. There are multiple enlarged mediastinal lymph nodes. Level 4 are node measures 10 mm. Lungs/Pleura: There is confluent soft tissue at the left hilum encasing the main left pulmonary artery and severely narrowing the proximal lobar branches. Abnormal soft tissue extends in a peribronchial pattern. There is also abnormal soft tissue at the right hilum. There is atelectasis within the right middle lobe and an area of consolidation in the lingula. Small pleural effusions. Upper Abdomen: Contrast bolus timing is not optimized for evaluation of the abdominal organs. There is an intermediate density 2.0 x 1.3 cm right adrenal nodule. There is a lower density left adrenal nodule measuring 1.5 cm. Musculoskeletal: No chest  wall abnormality. No bony spinal canal stenosis. Review of the MIP images confirms the above findings. IMPRESSION: 1. No pulmonary embolus or acute aortic syndrome. 2. Bulky abnormal soft tissue at the left hilum encasing the main left pulmonary artery and severely narrowing the proximal lobar branches, consistent with malignancy. 3. Multiple enlarged mediastinal lymph nodes, consistent with lymphatic metastatic disease. 4. Bilateral adrenal nodules, measuring up to 2.0 x 1.3 cm. The right adrenal nodule is concerning for metastatic disease. The left may be an adenoma. 5. Small pericardial effusion and small pleural effusions. 6. Consolidation in the lingula may be  postobstructive. Aortic atherosclerosis (ICD10-I70.0). Electronically Signed   By: Ulyses Jarred M.D.   On: 05/15/2021 01:57   MR BRAIN W WO CONTRAST  Result Date: 05/18/2021 CLINICAL DATA:  Lung cancer diagnosis.  Staging. EXAM: MRI HEAD WITHOUT AND WITH CONTRAST TECHNIQUE: Multiplanar, multiecho pulse sequences of the brain and surrounding structures were obtained without and with intravenous contrast. CONTRAST:  39mL GADAVIST GADOBUTROL 1 MMOL/ML IV SOLN COMPARISON:  None. FINDINGS: Brain: Study suffers from motion degradation. Diffusion imaging does not show any acute or subacute infarction or other cause of restricted diffusion. No evidence of chronic small vessel disease or old cortical infarction. Very few punctate foci of T2 and FLAIR signal within the white matter. No sign of mass lesion, hemorrhage, hydrocephalus or extra-axial collection. No abnormal contrast enhancement occurs. Vascular: Major vessels at the base of the brain show flow. Skull and upper cervical spine: Negative Sinuses/Orbits: Clear/normal Other: None IMPRESSION: No evidence of regional metastatic disease. Sensitivity is slightly limited by motion degradation and utilization of fast scanning protocol. I do not see any suspicious findings. Electronically Signed   By: Nelson Chimes M.D.   On: 05/18/2021 13:44   CT ABDOMEN PELVIS W CONTRAST  Result Date: 05/15/2021 CLINICAL DATA:  Abnormal left hilar soft tissue noted on chest CTA. This was concerning for malignancy. Additional evaluation to assess for possible metastatic disease. EXAM: CT ABDOMEN AND PELVIS WITH CONTRAST TECHNIQUE: Multidetector CT imaging of the abdomen and pelvis was performed using the standard protocol following bolus administration of intravenous contrast. CONTRAST:  74mL OMNIPAQUE IOHEXOL 350 MG/ML SOLN COMPARISON:  Current chest CTA. FINDINGS: Lower chest: Small right effusion. Left lung base subcentimeter nodules. Small patchy areas of ground-glass opacity at  the left lung base. Pericardial effusion. No change from the earlier chest CTA. Hepatobiliary: Subtle liver lesions suspicious for metastatic disease. Largest lies in the right lobe, segment 6, 1.4 cm in size. Liver normal in size and overall attenuation. Mildly distended gallbladder. Increased attenuation material in the dependent gallbladder possibly small stones or sludge. No wall thickening or inflammation. No bile duct dilation. Pancreas: Unremarkable. No pancreatic ductal dilatation or surrounding inflammatory changes. Spleen: Normal in size without focal abnormality. Adrenals/Urinary Tract: Bilateral adrenal masses, right 2.1 x 1.4 cm, left 1.1 cm size. Symmetric renal enhancement and excretion. No renal masses, stones or hydronephrosis. Normal ureters. Bladder minimally distended. Wall appears thickened. No bladder mass. Stomach/Bowel: Normal stomach. Small bowel and colon are normal in caliber. No wall thickening. No inflammation. Normal appendix suggested. No evidence of appendicitis. Vascular/Lymphatic: Mild aortic atherosclerosis.  No aneurysm. Prominent lymph nodes above the celiac axis, largest 1.2 cm in short axis. Prominent but subcentimeter retroperitoneal lymph nodes. No other enlarged nodes. Reproductive: Significant prostate enlargement elevating the bladder base. Prostate measures 6.9 cm superior to inferior by 5.8 x 5.6 cm transversely. Other: No abdominal wall hernia or abnormality. No abdominopelvic ascites. Musculoskeletal: Small  area of sclerosis in the right ilium, likely a bone island. More definitive bone island noted in the anterior left femoral head. Vague sclerotic lesion along the lower endplate of T9. No osteolytic lesions. No fracture or acute finding. IMPRESSION: 1. Subtle low-density liver lesions consistent with metastatic disease. 2. Bilateral adrenal masses suspicious for metastatic disease. 3. Prominent lymph nodes above the celiac axis suspicious for metastatic disease. 4.  Subtle areas of sclerosis, right ilium and lower endplate of T9, nonspecific, possibly metastatic disease. 5. No acute findings. Electronically Signed   By: Lajean Manes M.D.   On: 05/15/2021 17:56   MR LIVER W WO CONTRAST  Result Date: 05/18/2021 CLINICAL DATA:  Inpatient. Acute respiratory failure with infiltrative left upper lobe soft tissue and bilateral mediastinal adenopathy suspicious on chest CT for lung malignancy. Indeterminate liver and bilateral adrenal lesions on CT abdomen study. EXAM: MRI ABDOMEN WITHOUT AND WITH CONTRAST TECHNIQUE: Multiplanar multisequence MR imaging of the abdomen was performed both before and after the administration of intravenous contrast. CONTRAST:  9m GADAVIST GADOBUTROL 1 MMOL/ML IV SOLN COMPARISON:  05/15/2021 CT abdomen/pelvis and chest CT angiogram. FINDINGS: Substantially motion degraded exam, limiting assessment. Lower chest: Small right and trace left dependent pleural effusions. Geographic consolidation in the visualized lingula and right middle lobe. Hepatobiliary: Normal liver size and configuration. No hepatic steatosis. There are several (at least 5) mildly T2 hyperintense T1 hypointense hypoenhancing liver masses scattered throughout the liver with restricted diffusion, compatible with metastatic disease, largest 1.5 x 1.3 cm in the segment 6 right liver (series 4/image 20), 1.3 x 1.1 cm in the anterior segment 4 left liver (series 4/image 18) and 1.1 x 1.1 cm in the segment 8 right liver (series 4/image 9). Normal gallbladder with no cholelithiasis. No biliary ductal dilatation. Common bile duct diameter 4 mm. No choledocholithiasis. No biliary masses, strictures or beading. Pancreas: No pancreatic mass or duct dilation.  No pancreas divisum. Spleen: Normal size. No mass. Adrenals/Urinary Tract: Enhancing right adrenal 2.5 x 1.7 cm nodule (series 3/image 18) demonstrates no significant loss of signal intensity on chemical shift imaging and is  indeterminate. Similar enhancing left adrenal 1.5 x 1.3 cm nodule (series 3/image 23), too small to characterize for intralesional lipid on the motion degraded chemical shift images. No hydronephrosis. Scattered bilateral simple renal cysts, largest 1.6 cm in the anterior upper left kidney. T2 hypointense T1 hyperintense 1.1 cm renal cortical lesion in the lower right kidney (series 4/image 33) without compelling enhancement on the motion degraded postcontrast sequences, compatible with hemorrhagic/proteinaceous Bosniak category 2 renal cyst. No overtly suspicious renal masses. Stomach/Bowel: Normal non-distended stomach. Visualized small and large bowel is normal caliber, with no bowel wall thickening. Vascular/Lymphatic: Normal caliber abdominal aorta. Patent portal, splenic, hepatic and renal veins. A few mildly enlarged left para-aortic nodes, largest 1.2 cm short axis diameter (series 18/image 66). Enlarged 1.5 cm high left retroperitoneal node anterior to the left diaphragmatic crus (series 18/image 37). Other: No abdominal ascites or focal fluid collection. Musculoskeletal: Enhancing inferior T9 vertebral 2.8 cm and right T10 vertebral 1.1 cm bone lesions (series 17/image 19). Enhancing 2.1 cm medial right iliac bone lesion (series 17/image 19). IMPRESSION: 1. Limited motion degraded scan. 2. Several (at least 5) hypoenhancing liver masses scattered throughout the liver, with MRI features compatible with metastatic disease. Percutaneous biopsy of the 1.5 cm segment 6 right liver or 1.3 cm anterior segment 4 left liver lesions could be considered. 3. Indeterminate enhancing bilateral adrenal nodules, measuring 2.5 cm on  the right and 1.5 cm on the left. Metastatic disease is not excluded. 4. Left para-aortic and high left retroperitoneal lymphadenopathy suspicious for metastatic disease. 5. Enhancing T9 and T10 vertebral and medial right iliac bone lesions, suspicious for bone metastases. 6. Small right and  trace left dependent pleural effusions. Geographic consolidation in the visualized lingula and right middle lobe, nonspecific, please see recent chest CT angiogram study for details. Electronically Signed   By: Ilona Sorrel M.D.   On: 05/18/2021 16:39   DG Chest Portable 1 View  Result Date: 06/07/2021 CLINICAL DATA:  Cough, shortness of breath. EXAM: PORTABLE CHEST 1 VIEW COMPARISON:  May 14, 2021. FINDINGS: Stable cardiomediastinal silhouette. Interval placement of right internal jugular Port-A-Cath with distal tip in expected position of the SVC. No pneumothorax is noted. Left upper lobe consolidation is noted concerning for pneumonia or atelectasis. Probable central pulmonary vascular congestion is noted. Bony thorax is unremarkable. IMPRESSION: Left upper lobe opacity is noted concerning for pneumonia or atelectasis. Electronically Signed   By: Marijo Conception M.D.   On: 06/07/2021 12:13   ECHOCARDIOGRAM COMPLETE  Result Date: 06/08/2021    ECHOCARDIOGRAM REPORT   Patient Name:   ZYMIERE TROSTLE Date of Exam: 06/08/2021 Medical Rec #:  [id number]      Height:       69.0 in Accession #:    [id number]     Weight:       220.7 lb Date of Birth:  12-17-1955      BSA:          2.154 m Patient Age:    65 years       BP:           130/81 mmHg Patient Gender: M              HR:           108 bpm. Exam Location:  [facility] Procedure: 2D Echo, Cardiac Doppler, Color Doppler and Strain Analysis Indications:    CHF-acute diastolic B71.69  History:        Patient has no prior history of Echocardiogram examinations.                 COPD.  Sonographer:    Sherrie Sport Referring Phys: [id number] Ivor Costa Diagnosing      Kate Sable MD Phys:  Sonographer Comments: Suboptimal apical window. Global longitudinal strain was attempted. IMPRESSIONS  1. Left ventricular ejection fraction, by estimation, is 65 to 70%. The left ventricle has normal function. The left ventricle has no regional wall motion abnormalities. Left ventricular  diastolic parameters are consistent with Grade I diastolic dysfunction (impaired relaxation).  2. Right ventricular systolic function is normal. The right ventricular size is mildly enlarged.  3. A small pericardial effusion is present.  4. The mitral valve is normal in structure. No evidence of mitral valve regurgitation.  5. The aortic valve is tricuspid. Aortic valve regurgitation is not visualized. FINDINGS  Left Ventricle: Left ventricular ejection fraction, by estimation, is 65 to 70%. The left ventricle has normal function. The left ventricle has no regional wall motion abnormalities. Global longitudinal strain performed but not reported based on interpreter judgement due to suboptimal tracking. 3D left ventricular ejection fraction analysis performed but not reported based on interpreter judgement due to suboptimal quality. The left ventricular internal cavity size was normal in size. There is no left ventricular hypertrophy. Left ventricular diastolic parameters are consistent with Grade I diastolic dysfunction (impaired relaxation). Right  Ventricle: The right ventricular size is mildly enlarged. No increase in right ventricular wall thickness. Right ventricular systolic function is normal. Left Atrium: Left atrial size was normal in size. Right Atrium: Right atrial size was normal in size. Pericardium: A small pericardial effusion is present. Mitral Valve: The mitral valve is normal in structure. No evidence of mitral valve regurgitation. Tricuspid Valve: The tricuspid valve is grossly normal. Tricuspid valve regurgitation is trivial. Aortic Valve: The aortic valve is tricuspid. Aortic valve regurgitation is not visualized. Aortic valve mean gradient measures 6.0 mmHg. Aortic valve peak gradient measures 10.5 mmHg. Aortic valve area, by VTI measures 2.89 cm. Pulmonic Valve: The pulmonic valve was not well visualized. Pulmonic valve regurgitation is not visualized. Aorta: The aortic root is normal in size  and structure. IAS/Shunts: No atrial level shunt detected by color flow Doppler.  LEFT VENTRICLE PLAX 2D LVIDd:         4.80 cm  Diastology LVIDs:         2.60 cm  LV e' medial:   1.96 cm/s LV PW:         1.30 cm  LV E/e' medial: 38.7 LV IVS:        1.20 cm LVOT diam:     2.10 cm LV SV:         76 LV SV Index:   35 LVOT Area:     3.46 cm 3D Volume EF:                         3D EF:        52 %                         LV EDV:       96 ml                         LV ESV:       46 ml                         LV SV:        50 ml RIGHT VENTRICLE RV S prime:     14.00 cm/s TAPSE (M-mode): 5.7 cm LEFT ATRIUM             Index       RIGHT ATRIUM           Index LA diam:        3.30 cm 1.53 cm/m  RA Area:     21.00 cm LA Vol (A2C):   64.2 ml 29.80 ml/m RA Volume:   55.60 ml  25.81 ml/m LA Vol (A4C):   31.2 ml 14.48 ml/m LA Biplane Vol: 45.4 ml 21.07 ml/m  AORTIC VALVE                    PULMONIC VALVE AV Area (Vmax):    2.39 cm     PV Vmax:       0.73 m/s AV Area (Vmean):   2.78 cm     PV Peak grad:  2.1 mmHg AV Area (VTI):     2.89 cm AV Vmax:           162.00 cm/s AV Vmean:          108.000 cm/s AV VTI:  0.261 m AV Peak Grad:      10.5 mmHg AV Mean Grad:      6.0 mmHg LVOT Vmax:         112.00 cm/s LVOT Vmean:        86.800 cm/s LVOT VTI:          0.218 m LVOT/AV VTI ratio: 0.84  AORTA Ao Root diam: 3.30 cm MITRAL VALVE                TRICUSPID VALVE MV Area (PHT): 3.95 cm     TR Peak grad:   30.0 mmHg MV Decel Time: 192 msec     TR Vmax:        274.00 cm/s MV E velocity: 75.80 cm/s MV A velocity: 119.00 cm/s  SHUNTS MV E/A ratio:  0.64         Systemic VTI:  0.22 m                             Systemic Diam: 2.10 cm Kate Sable MD Electronically signed by Kate Sable MD Signature Date/Time: 06/08/2021/3:10:53 PM    Final    [place] CORE BIOPSY (LYMPH NODES)  Result Date: 05/20/2021 INDICATION: Possible lung carcinoma by prior imaging with vague liver lesions and also enlarged left  supraclavicular lymph node by CT. Due to oxygen requirement and poor respiratory status, biopsy of the left supraclavicular lymph node is performed without sedation as tolerance a liver biopsy currently with sedation would be borderline and potentially high risk. EXAM: ULTRASOUND GUIDED CORE BIOPSY OF LEFT SUPRACLAVICULAR LYMPH NODE MEDICATIONS: None. ANESTHESIA/SEDATION: None PROCEDURE: The procedure, risks, benefits, and alternatives were explained to the patient. Questions regarding the procedure were encouraged and answered. The patient understands and consents to the procedure. A time-out was performed prior to initiating the procedure. The left neck was prepped with chlorhexidine in a sterile fashion, and a sterile drape was applied covering the operative field. A sterile gown and sterile gloves were used for the procedure. Local anesthesia was provided with 1% Lidocaine. After localizing an enlarged left supraclavicular lymph node, an 18 gauge core biopsy device was utilized in obtaining 4 separate core biopsy samples. Material was submitted in formalin. Additional ultrasound was performed. COMPLICATIONS: None immediate. FINDINGS: Several left supraclavicular and lower cervical lymph nodes are identified. The largest measures approximately 1.7 x 1.3 x 1.4 cm by ultrasound. Solid core biopsy samples were obtained from this lymph node. IMPRESSION: Ultrasound-guided core biopsy performed enlarged left supraclavicular lymph node. Electronically Signed   By: Aletta Edouard M.D.   On: 05/20/2021 11:02   IR IMAGING GUIDED PORT INSERTION  Result Date: 06/03/2021 INDICATION: Metastatic lung cancer.  Chemotherapy. EXAM: IMPLANTED PORT A CATH PLACEMENT WITH ULTRASOUND AND FLUOROSCOPIC GUIDANCE MEDICATIONS: None ANESTHESIA/SEDATION: Moderate (conscious) sedation was employed during this procedure. A total of Versed 0 mg and Fentanyl 25 mcg was administered intravenously. Moderate Sedation Time: 20 minutes. The  patient's level of consciousness and vital signs were monitored continuously by radiology nursing throughout the procedure under my direct supervision. FLUOROSCOPY TIME:  0 minutes, 24 seconds (4.5 mGy) COMPLICATIONS: None immediate. PROCEDURE: The procedure, risks, benefits, and alternatives were explained to the patient. Questions regarding the procedure were encouraged and answered. The patient understands and consents to the procedure. The neck and chest were prepped with chlorhexidine in a sterile fashion, and a sterile drape was applied covering the operative field. Maximum barrier sterile technique with sterile gowns  and gloves were used for the procedure. A timeout was performed prior to the initiation of the procedure. Local anesthesia was provided with 1% lidocaine with epinephrine. After creating a small venotomy incision, a micropuncture kit was utilized to access the internal jugular vein under direct, real-time ultrasound guidance. Ultrasound image documentation was performed. The microwire was kinked to measure appropriate catheter length. A subcutaneous port pocket was then created along the upper chest wall utilizing a combination of sharp and blunt dissection. The pocket was irrigated with sterile saline. A single lumen ISP power injectable port was chosen for placement. The 8 Fr catheter was tunneled from the port pocket site to the venotomy incision. The port was placed in the pocket. The external catheter was trimmed to appropriate length. At the venotomy, an 8 Fr peel-away sheath was placed over a guidewire under fluoroscopic guidance. The catheter was then placed through the sheath and the sheath was removed. Final catheter positioning was confirmed and documented with a fluoroscopic spot radiograph. The port was accessed with a Huber needle, aspirated and flushed with heparinized saline. The port pocket incision was closed with interrupted 3-0 Vicryl suture then Dermabond was applied,  including at the venotomy incision. Dressings were placed. The patient tolerated the procedure well without immediate post procedural complication. IMPRESSION: Successful placement of a right internal jugular approach power injectable Port-A-Cath. The catheter is ready for immediate use. Michaelle Birks, MD [facility] Electronically Signed   By: Michaelle Birks M.D.   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  MRI of the brain on May 18, 2021 negative for disease.  Patient will require PET scan which is scheduled next week to complete the staging work-up.  There was insufficient tissue for ancillary studies, therefore will send liquid biopsy to assess for any underlying mutations and PD-L1 status.  Pending mutational status, patient will receive carboplatinum, Taxol, and Avastin every 3 weeks for 4-6 cycles with Udenyca support with the cycle.  He will require port placement prior to initiating treatment. Shortness of breath/oxygen requirement: Patient has follow-up with pulmonology in the near future. Leukocytosis: Likely reactive, monitor.  Patient expressed understanding and was in agreement with this plan. He also understands that He can call clinic at any time with any questions, concerns, or complaints.   Cancer Staging Adenocarcinoma of left lung [facility]) Staging form: Lung, AJCC 8th Edition - Clinical stage from 05/27/2021: Stage IVB (cTX, cN3, pM1c) - Signed by Lloyd Huger, MD on 05/27/2021  Lloyd Huger, MD   06/13/2021 11:53 AM

## 2021-06-14 ENCOUNTER — Inpatient Hospital Stay: Payer: Self-pay

## 2021-06-14 DIAGNOSIS — Z515 Encounter for palliative care: Secondary | ICD-10-CM

## 2021-06-14 DIAGNOSIS — J9621 Acute and chronic respiratory failure with hypoxia: Secondary | ICD-10-CM

## 2021-06-14 DIAGNOSIS — C3492 Malignant neoplasm of unspecified part of left bronchus or lung: Secondary | ICD-10-CM

## 2021-06-14 LAB — COMPREHENSIVE METABOLIC PANEL
ALT: 26 U/L (ref 0–44)
AST: 23 U/L (ref 15–41)
Albumin: 3.3 g/dL — ABNORMAL LOW (ref 3.5–5.0)
Alkaline Phosphatase: 111 U/L (ref 38–126)
Anion gap: 11 (ref 5–15)
BUN: 21 mg/dL (ref 8–23)
CO2: 31 mmol/L (ref 22–32)
Calcium: 9.4 mg/dL (ref 8.9–10.3)
Chloride: 92 mmol/L — ABNORMAL LOW (ref 98–111)
Creatinine, Ser: 0.65 mg/dL (ref 0.61–1.24)
GFR, Estimated: >60 mL/min (ref >60)
Glucose, Bld: 145 mg/dL — ABNORMAL HIGH (ref 70–99)
Potassium: 3.9 mmol/L (ref 3.5–5.1)
Sodium: 134 mmol/L — ABNORMAL LOW (ref 135–145)
Total Bilirubin: 0.9 mg/dL (ref 0.3–1.2)
Total Protein: 6.7 g/dL (ref 6.5–8.1)

## 2021-06-14 LAB — BLOOD GAS, VENOUS
Acid-Base Excess: 12.3 mmol/L — ABNORMAL HIGH (ref 0.0–2.0)
Bicarbonate: 37.4 mmol/L — ABNORMAL HIGH (ref 20.0–28.0)
O2 Saturation: 97.7 %
Patient temperature: 37
pCO2, Ven: 48 mmHg (ref 44.0–60.0)
pH, Ven: 7.5 — ABNORMAL HIGH (ref 7.250–7.430)
pO2, Ven: 91 mmHg — ABNORMAL HIGH (ref 32.0–45.0)

## 2021-06-14 LAB — CBC WITH DIFFERENTIAL/PLATELET
Abs Immature Granulocytes: 0.07 K/uL (ref 0.00–0.07)
Basophils Absolute: 0 K/uL (ref 0.0–0.1)
Basophils Relative: 0 %
Eosinophils Absolute: 0 K/uL (ref 0.0–0.5)
Eosinophils Relative: 0 %
HCT: 43.6 % (ref 39.0–52.0)
Hemoglobin: 14.7 g/dL (ref 13.0–17.0)
Immature Granulocytes: 1 %
Lymphocytes Relative: 3 %
Lymphs Abs: 0.3 K/uL — ABNORMAL LOW (ref 0.7–4.0)
MCH: 27.6 pg (ref 26.0–34.0)
MCHC: 33.7 g/dL (ref 30.0–36.0)
MCV: 81.8 fL (ref 80.0–100.0)
Monocytes Absolute: 0.2 K/uL (ref 0.1–1.0)
Monocytes Relative: 2 %
Neutro Abs: 10.2 K/uL — ABNORMAL HIGH (ref 1.7–7.7)
Neutrophils Relative %: 94 %
Platelets: 268 K/uL (ref 150–400)
RBC: 5.33 MIL/uL (ref 4.22–5.81)
RDW: 12.4 % (ref 11.5–15.5)
WBC: 10.8 K/uL — ABNORMAL HIGH (ref 4.0–10.5)
nRBC: 0 % (ref 0.0–0.2)

## 2021-06-14 LAB — MAGNESIUM
Magnesium: 2.2 mg/dL (ref 1.7–2.4)
Magnesium: 2.2 mg/dL (ref 1.7–2.4)

## 2021-06-14 LAB — PROCALCITONIN: Procalcitonin: <0.1 ng/mL

## 2021-06-14 LAB — TROPONIN I (HIGH SENSITIVITY)
Troponin I (High Sensitivity): 48 ng/L — ABNORMAL HIGH (ref <18)
Troponin I (High Sensitivity): 55 ng/L — ABNORMAL HIGH

## 2021-06-14 LAB — LACTIC ACID, PLASMA: Lactic Acid, Venous: 2.2 mmol/L (ref 0.5–1.9)

## 2021-06-14 LAB — PHOSPHORUS: Phosphorus: 4.4 mg/dL (ref 2.5–4.6)

## 2021-06-14 MED ORDER — FUROSEMIDE 10 MG/ML IJ SOLN
40.0000 mg | Freq: Once | INTRAMUSCULAR | Status: AC
  Administered 2021-06-14: 40 mg via INTRAVENOUS
  Filled 2021-06-14: qty 4

## 2021-06-14 MED ORDER — DIPHENHYDRAMINE HCL 25 MG PO CAPS
25.0000 mg | ORAL_CAPSULE | Freq: Four times a day (QID) | ORAL | Status: DC | PRN
  Administered 2021-06-14 – 2021-06-27 (×11): 25 mg via ORAL
  Filled 2021-06-14 (×14): qty 1

## 2021-06-14 MED ORDER — NEPRO/CARBSTEADY PO LIQD
237.0000 mL | Freq: Three times a day (TID) | ORAL | Status: DC
  Administered 2021-06-14 – 2021-06-27 (×26): 237 mL via ORAL

## 2021-06-14 MED ORDER — ACETYLCYSTEINE 20 % IN SOLN
3.0000 mL | Freq: Once | RESPIRATORY_TRACT | Status: DC
  Filled 2021-06-14: qty 4

## 2021-06-14 MED ORDER — MORPHINE SULFATE (PF) 2 MG/ML IV SOLN
2.0000 mg | Freq: Once | INTRAVENOUS | Status: AC
  Administered 2021-06-14: 2 mg via INTRAVENOUS
  Filled 2021-06-14: qty 1

## 2021-06-14 MED ORDER — GUAIFENESIN ER 600 MG PO TB12
600.0000 mg | ORAL_TABLET | Freq: Two times a day (BID) | ORAL | Status: DC
  Administered 2021-06-14 – 2021-07-02 (×33): 600 mg via ORAL
  Filled 2021-06-14 (×35): qty 1

## 2021-06-14 MED ORDER — ENOXAPARIN SODIUM 40 MG/0.4ML IJ SOSY
40.0000 mg | PREFILLED_SYRINGE | INTRAMUSCULAR | Status: DC
  Administered 2021-06-14 – 2021-06-19 (×6): 40 mg via SUBCUTANEOUS
  Filled 2021-06-14 (×5): qty 0.4

## 2021-06-14 NOTE — Evaluation (Addendum)
Clinical/Bedside Swallow Evaluation Patient Details  Name: Derek Clarke MRN: [id number] Date of Birth: 1956-03-24  Today's Date: 06/14/2021 Time: SLP Start Time (ACUTE ONLY): 1145 SLP Stop Time (ACUTE ONLY): 1245 SLP Time Calculation (min) (ACUTE ONLY): 60 min  Past Medical History:  Past Medical History:  Diagnosis Date   Adenocarcinoma ([place])    lung; stage 4 with mets to liver   Chronic respiratory failure with hypoxia (HCC)    baseline 4 L [place]   COPD (chronic obstructive pulmonary disease) (HCC)    GAD (generalized anxiety disorder)    Past Surgical History:  Past Surgical History:  Procedure Laterality Date   IR IMAGING GUIDED PORT INSERTION  06/03/2021   HPI:  Pt is a 65 y.o. 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  Patient is now readmitted 06/13/2021 with same.  Patient was recently hospitalized at [facility] from 06/07/2021 06/09/2021 for acute on chronic hypoxic respiratory failure in the setting of acute COPD exacerbation.  During his previous hospitalization, he underwent CTA chest on 06/24/2021 which was notable for no evidence of acute pulmonary embolism, but showed persistent left hilar Mass, with interval increase in consolidation of the left upper lobe, felt to represent either progression of malignancy versus postobstructive changes.  Patient having difficulty swallowing.  Recent CT scan showed wall thickening of the Esophagus but no other abnormality was noted. PPI initiated by MD.    Assessment / Plan / Recommendation  Clinical Impression  Pt appears to present w/ deficits of oropharyngeal phase swallow function suspect d/t impact of SEVERELY declined Pulmonary function/status. Pt exhibited WOB and SOB w/ ANY exertion including talking and moving about in chair.  Frequent Rest Breaks given for pt to calm his breathing b/t po's in order to lessen risk for aspiration. No suspected neurological impact on swallow function. ANY significant Pulmonary decline can impact Apnea timing during the swallow which can impact airway protection, and increase risk for aspiration to occur.   Pt was alert/awake; sitting in chair in room -- he stated his breathign was uncomfortable when in the bed. He followed all instructions during self-feeding of po trials w/ setup support. Pt consumed po trials of thin liquids via Cup w/ more immediate throat clearing and coughing noted; delayed, min throat clearing noted w/ trials of Nectar liquids and purees -- no immediate, overt coughing as w/ thin liquids; no wet vocal quality b/t trials and laryngeal excursion appeared improved. Pt's respiratory effort appeared to increase in effort during all po trials consumed -- no consistency was improved over the other. It calmed again w/ Rest Breaks b/t trials as instructed and using SINGLE, Small bites/sips. Pt was encouraged to eat/drink slowly w/ No Talking during oral intake. Oral phase adequate for bolus management of the trials; timely A-P transfer and full oral clearing achieved post swallows. NO trials of solid foods were given d/t pt's declined Pulmonary status and the increased effort of solids during mastication. OM exam WFL; no unilateral weakness. Vocal Quality was SIGNIFICANTLY reduced w/ decreased breath support for speech -- speech halted at 1-2 words at a time. However, Vocal Quality seemed more declined than just poor breath support -- Recommend ENT f/u for direct viewing.     Time spent on education of swallowing/dysphagia and oral intake;  Education given on food consistencies/options  and need for cohesive foods. Educated pt on general aspiration precautions d/t Pulmonary status - the need for frequent Rest Breaks during oral intake.  Recommend a Pureed diet w/ moistened foods for easier  mastication to lessen WOB during oral phase effort. Recommend Nectar liquids; aspiration precautions. Pills Whole vs Crushed in Puree for ease of swallowing. Recommend general REFLUX precautions. ST services will f/u while admitted. Palliative care/MD/NSG updated. SLP Visit Diagnosis: Dysphagia, oropharyngeal phase (R13.12) (Pulmonary decline)    Aspiration Risk  Moderate aspiration risk;Risk for inadequate nutrition/hydration    Diet Recommendation   Pureed diet w/ moistened foods for easier mastication to lessen WOB during oral phase effort. Recommend Nectar liquids; aspiration precautions. Recommend general REFLUX precautions. Rest Breaks during all oral intake.  Medication Administration: Crushed with puree    Other  Recommendations Recommended Consults:  (Palliative Care; Dietician) Oral Care Recommendations: Oral care BID Other Recommendations: Order thickener from pharmacy;Prohibited food (jello, ice cream, thin soups);Have oral suction available;Remove water pitcher    Recommendations for follow up therapy are one component of a multi-disciplinary discharge planning process, led by the attending physician.  Recommendations may be updated based on patient status, additional functional criteria and insurance authorization.  Follow up Recommendations  (TBD)      Frequency and Duration min 3x week  2 weeks       Prognosis Prognosis for Safe Diet Advancement: Guarded Barriers to Reach Goals: Time post onset;Severity of deficits Barriers/Prognosis Comment: pulmonary decline      Swallow Study   General Date of Onset: 06/13/21 HPI: Pt is a 65 y.o. 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  Patient is now readmitted 06/13/2021 with same.  Patient was recently  hospitalized at [facility] from 06/07/2021 06/09/2021 for acute on chronic hypoxic respiratory failure in the setting of acute COPD exacerbation.  During his previous hospitalization, he underwent CTA chest on 06/24/2021 which was notable for no evidence of acute pulmonary embolism, but showed persistent left hilar Mass, with interval increase in consolidation of the left upper lobe, felt to represent either progression of malignancy versus postobstructive changes.  Patient having difficulty swallowing.  Recent CT scan showed wall thickening of the Esophagus but no other abnormality was noted. PPI initiated by MD. Type of Study: Bedside Swallow Evaluation Previous Swallow Assessment: none Diet Prior to this Study: NPO (pt reported eating similar at home) Temperature Spikes Noted: No (wbc 10.8 declining) Respiratory Status: Nasal cannula (4L) History of Recent Intubation: No Behavior/Cognition: Alert;Cooperative;Pleasant mood;Distractible Oral Cavity Assessment: Dry Oral Care Completed by SLP: Yes Oral Cavity - Dentition: Adequate natural dentition;Missing dentition (few) Vision: Functional for self-feeding Self-Feeding Abilities: Able to feed self;Needs set up Patient Positioning: Upright in chair Baseline Vocal Quality: Hoarse;Breathy;Low vocal intensity (Raspy; suspect VC impairment) Volitional Cough: Weak (not effective) Volitional Swallow: Able to elicit    Oral/Motor/Sensory Function Overall Oral Motor/Sensory Function: Within functional limits   Ice Chips Ice chips: Within functional limits Presentation: Spoon (fed; 2 trials)   Thin Liquid Thin Liquid: Impaired Presentation: Cup;Self Fed (6 trials) Pharyngeal  Phase Impairments: Multiple swallows;Suspected delayed Swallow;Throat Clearing - Immediate;Cough - Immediate (frequent) Other Comments: attempted precautions w/ no change    Nectar Thick Nectar Thick Liquid: Impaired Presentation: Cup;Self Fed (6 trials) Pharyngeal Phase Impairments:  Multiple swallows;Throat Clearing - Delayed (x1) Other  Comments: w/ precautions   Honey Thick Honey Thick Liquid: Not tested   Puree Puree: Impaired Presentation: Spoon;Self Fed (6 trials) Pharyngeal Phase Impairments: Multiple swallows;Throat Clearing - Delayed (x1) Other Comments: w/ precautions   Solid     Solid: Not tested Other Comments: d/t pulmonary status         Orinda Kenner, MS, CCC-SLP Speech Language Pathologist [facility] [phone] Rose-Marie Hickling 06/14/2021,4:45 PM

## 2021-06-14 NOTE — Consult Note (Signed)
[place]  Telephone:([phone] [phone]   Name: JUVON TEATER Date: 06/14/2021 MRN: [id number]  DOB: 03/11/1956  Patient Care Team: Pcp, No as PCP - Pierre Bali, RN as Oncology Nurse Navigator    REASON FOR CONSULTATION: Derek Clarke is a 65 y.o. 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  Patient is now readmitted 06/13/2021 with same.  Palliative care was consulted help address goals  SOCIAL HISTORY:     reports that he quit smoking about 2 months ago. His smoking use included cigarettes. He has a 30.00 pack-year smoking history. He has never used smokeless tobacco. He reports current alcohol use. He reports current drug use. Drug: Marijuana.  Patient is unmarried and has no children.  He lives at home alone.  He has a brother who lives in [place].  Patient is a veteran from the [place].  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  His last job was at a [employer].  ADVANCE DIRECTIVES:  On file  CODE STATUS: Full code  PAST MEDICAL HISTORY: Past Medical History:  Diagnosis Date   Adenocarcinoma ([place])    lung; stage 4 with mets to liver   Chronic respiratory failure with hypoxia (HCC)    baseline 4 L [place]   COPD (chronic obstructive pulmonary disease) (HCC)    GAD (generalized anxiety disorder)     PAST SURGICAL HISTORY:  Past Surgical History:  Procedure Laterality Date   IR IMAGING GUIDED PORT INSERTION  06/03/2021    HEMATOLOGY/ONCOLOGY HISTORY:  Oncology History  Adenocarcinoma of left lung ([place])  05/26/2021 Initial Diagnosis   Adenocarcinoma of left lung ([place])   05/27/2021 Cancer Staging   Staging  form: Lung, AJCC 8th Edition - Clinical stage from 05/27/2021: Stage IVB (cTX, cN3, pM1c) - Signed by Lloyd Huger, MD on 05/27/2021   06/16/2021 -  Chemotherapy    Patient is on Treatment Plan: LUNG NSCLC CARBOPLATIN + PACLITAXEL + BEVACIZUMAB Q21D X 1 CYCLE        ALLERGIES:  has No Known Allergies.  MEDICATIONS:  Current Facility-Administered Medications  Medication Dose Route Frequency Provider Last Rate Last Admin   acetaminophen (TYLENOL) tablet 650 mg  650 mg Oral Q6H PRN Howerter, Justin B, DO       Or   acetaminophen (TYLENOL) suppository 650 mg  650 mg Rectal Q6H PRN Howerter, Justin B, DO       albuterol (PROVENTIL) (2.5 MG/3ML) 0.083% nebulizer solution 2.5 mg  2.5 mg Nebulization Q4H PRN Howerter, Justin B, DO   2.5 mg at 06/13/21 2203   aspirin EC tablet 325 mg  325 mg Oral Once Howerter, Justin B, DO       enoxaparin (LOVENOX) injection 40 mg  40 mg Subcutaneous Q24H Bonnielee Haff, MD   40 mg at 06/14/21 0929   feeding supplement (ENSURE ENLIVE / ENSURE PLUS) liquid 237 mL  237 mL Oral BID BM Howerter, Justin B, DO       ipratropium-albuterol (DUONEB) 0.5-2.5 (3) MG/3ML nebulizer solution 3 mL  3 mL Nebulization Q6H Howerter, Justin B, DO   3 mL at 06/14/21 0930   levofloxacin (LEVAQUIN) IVPB 750 mg  750 mg Intravenous Q24H Howerter, Justin B, DO   Stopped at 06/13/21 2333   LORazepam (ATIVAN) injection 0.5 mg  0.5 mg Intravenous Q4H PRN Howerter, Justin B, DO   0.5 mg at 06/13/21 2209   methylPREDNISolone sodium succinate (SOLU-MEDROL) 125 mg/2 mL injection 80 mg  80 mg Intravenous Q12H Howerter, Justin B, DO   80 mg at 06/13/21 2326   ondansetron (ZOFRAN) injection 4 mg  4 mg Intravenous Q6H PRN Howerter, Justin B, DO       Current Outpatient Medications  Medication Sig Dispense Refill   albuterol (VENTOLIN HFA) 108 (90 Base) MCG/ACT inhaler Inhale 2 puffs into the lungs every 6 (six) hours as needed for wheezing or shortness of breath. 8 g 2   ALPRAZolam (XANAX)  0.5 MG tablet Take 1 tablet (0.5 mg total) by mouth 2 (two) times daily as needed for anxiety. 60 tablet 0   busPIRone (BUSPAR) 5 MG tablet Take 1 tablet (5 mg total) by mouth 3 (three) times daily. 90 tablet 1   chlorpheniramine (CHLOR-TRIMETON) 4 MG tablet Take 4 mg by mouth 2 (two) times daily as needed for allergies.     diphenhydrAMINE (BENADRYL) 25 MG tablet Take 25 mg by mouth every 6 (six) hours as needed.     fluticasone furoate-vilanterol (BREO ELLIPTA) 100-25 MCG/INH AEPB Inhale 1 puff into the lungs daily. 1 each 1   guaiFENesin (MUCINEX) 600 MG 12 hr tablet Take 600 mg by mouth 2 (two) times daily as needed for cough or to loosen phlegm.     ibuprofen (ADVIL) 400 MG tablet Take 200 mg by mouth every 4 (four) hours as needed for mild pain.     Multiple Vitamin (MULTIVITAMIN WITH MINERALS) TABS tablet Take 1 tablet by mouth daily. 90 tablet 1   feeding supplement (ENSURE ENLIVE / ENSURE PLUS) LIQD Take 237 mLs by mouth 2 (two) times daily between meals. 237 mL 12   lidocaine-prilocaine (EMLA) cream Apply to affected area once 30 g 3   ondansetron (ZOFRAN) 8 MG tablet Take 1 tablet (8 mg total) by mouth 2 (two) times daily as needed for refractory nausea / vomiting. 60 tablet 2   prochlorperazine (COMPAZINE) 10 MG tablet Take 1 tablet (10 mg total) by mouth every 6 (six) hours as needed (Nausea or vomiting). 60 tablet 2    VITAL SIGNS: BP 111/79 (BP Location: Right Arm)   Pulse (!) 101   Temp 98.2 F (36.8 C) (Oral)   Resp 20   Ht 5\' 9"  (1.753 m)   Wt 220 lb 7.4 oz (100 kg)   SpO2 94%   BMI 32.56 kg/m  Filed Weights   06/13/21 1733  Weight: 220 lb 7.4 oz (100 kg)    Estimated body mass index is 32.56 kg/m as calculated from the following:   Height as of this encounter: 5\' 9"  (1.753 m).   Weight as of this encounter: 220 lb 7.4 oz (100 kg).  LABS: CBC:    Component Value Date/Time   WBC 10.8 (H) 06/14/2021 0420   HGB 14.7 06/14/2021 0420   HCT 43.6 06/14/2021 0420    PLT 268 06/14/2021 0420   MCV 81.8 06/14/2021 0420   NEUTROABS 10.2 (H) 06/14/2021 0420   LYMPHSABS 0.3 (L) 06/14/2021 0420   MONOABS 0.2 06/14/2021 0420   EOSABS 0.0 06/14/2021 0420   BASOSABS 0.0 06/14/2021 0420   Comprehensive Metabolic Panel:    Component Value Date/Time   NA 134 (L) 06/14/2021 0420  K 3.9 06/14/2021 0420   CL 92 (L) 06/14/2021 0420   CO2 31 06/14/2021 0420   BUN 21 06/14/2021 0420   CREATININE 0.65 06/14/2021 0420   GLUCOSE 145 (H) 06/14/2021 0420   CALCIUM 9.4 06/14/2021 0420   AST 23 06/14/2021 0420   ALT 26 06/14/2021 0420   ALKPHOS 111 06/14/2021 0420   BILITOT 0.9 06/14/2021 0420   PROT 6.7 06/14/2021 0420   ALBUMIN 3.3 (L) 06/14/2021 0420    RADIOGRAPHIC STUDIES: CT Angio Chest PE W and/or Wo Contrast  Result Date: 06/07/2021 CLINICAL DATA:  Tachycardia, vomiting, metastatic lung cancer EXAM: CT ANGIOGRAPHY CHEST WITH CONTRAST TECHNIQUE: Multidetector CT imaging of the chest was performed using the standard protocol during bolus administration of intravenous contrast. Multiplanar CT image reconstructions and MIPs were obtained to evaluate the vascular anatomy. CONTRAST:  84mL OMNIPAQUE IOHEXOL 350 MG/ML SOLN COMPARISON:  06/08/2011, 05/15/2021, FINDINGS: Cardiovascular: This is a technically adequate evaluation of the pulmonary vasculature. No filling defects or pulmonary emboli. Stable left hilar soft tissue mass encasing the left upper and left lower pulmonary arteries. Stable trace pericardial effusion. The heart is not enlarged. No evidence of thoracic aortic aneurysm or dissection. Mild atherosclerosis of the aorta and coronary vessels. Mediastinum/Nodes: There is segmental wall thickening of the mid to distal thoracic esophagus. Confluent soft tissue within the mediastinum and bilateral hila consistent with adenopathy, not grossly changed since prior study. An enlarged left supraclavicular lymph node on image 9/4 measures 13 mm in short axis,  unchanged. Thyroid and trachea are unremarkable. Lungs/Pleura: There are trace bilateral pleural effusions. Since the prior exam, there is progressive near complete consolidation of the left upper lobe, which may reflect progressive disease and/or postobstructive change. There is diffuse bilateral bronchial wall thickening, greatest at the lung bases. Continued right middle lobe collapse with occlusion of the right middle lobe bronchus again noted. Upper Abdomen: There are stable bilateral adrenal nodules, measuring up to 2.2 cm on the right. Multiple enlarged lymph nodes are again seen within the central upper abdomen, largest measuring 1.7 cm just posterior to the gastric cardia. Multiple hypodensities are again seen within the liver consistent with metastatic disease, grossly unchanged since prior study. Musculoskeletal: Stable sclerotic focus within the left T10 transverse process and within the T9 vertebral body, compatible with bony metastases. No acute displaced fractures. Reconstructed images demonstrate no additional findings. Review of the MIP images confirms the above findings. IMPRESSION: 1. No evidence of pulmonary embolus. 2. Persistent left hilar mass surrounding the left hilar vessels, with increasing near total consolidation of the left upper lobe since prior study. This may be progression of malignancy versus postobstructive change. 3. Continued coalescing lymphadenopathy throughout the mediastinum and bilateral hila. 4. Bilateral bronchial wall thickening, greatest at the lung bases. 5. Trace bilateral pleural effusions. 6. Segmental wall thickening of the mid to distal thoracic esophagus, which could be inflammatory or infectious. 7. Upper abdominal metastases with continued multiple liver lesions, bilateral adrenal nodularity, and upper abdominal adenopathy. Electronically Signed   By: Randa Ngo M.D.   On: 06/07/2021 15:12   MR BRAIN W WO CONTRAST  Result Date: 05/18/2021 CLINICAL DATA:   Lung cancer diagnosis.  Staging. EXAM: MRI HEAD WITHOUT AND WITH CONTRAST TECHNIQUE: Multiplanar, multiecho pulse sequences of the brain and surrounding structures were obtained without and with intravenous contrast. CONTRAST:  44mL GADAVIST GADOBUTROL 1 MMOL/ML IV SOLN COMPARISON:  None. FINDINGS: Brain: Study suffers from motion degradation. Diffusion imaging does not show any acute or subacute  infarction or other cause of restricted diffusion. No evidence of chronic small vessel disease or old cortical infarction. Very few punctate foci of T2 and FLAIR signal within the white matter. No sign of mass lesion, hemorrhage, hydrocephalus or extra-axial collection. No abnormal contrast enhancement occurs. Vascular: Major vessels at the base of the brain show flow. Skull and upper cervical spine: Negative Sinuses/Orbits: Clear/normal Other: None IMPRESSION: No evidence of regional metastatic disease. Sensitivity is slightly limited by motion degradation and utilization of fast scanning protocol. I do not see any suspicious findings. Electronically Signed   By: Nelson Chimes M.D.   On: 05/18/2021 13:44   CT ABDOMEN PELVIS W CONTRAST  Result Date: 05/15/2021 CLINICAL DATA:  Abnormal left hilar soft tissue noted on chest CTA. This was concerning for malignancy. Additional evaluation to assess for possible metastatic disease. EXAM: CT ABDOMEN AND PELVIS WITH CONTRAST TECHNIQUE: Multidetector CT imaging of the abdomen and pelvis was performed using the standard protocol following bolus administration of intravenous contrast. CONTRAST:  73mL OMNIPAQUE IOHEXOL 350 MG/ML SOLN COMPARISON:  Current chest CTA. FINDINGS: Lower chest: Small right effusion. Left lung base subcentimeter nodules. Small patchy areas of ground-glass opacity at the left lung base. Pericardial effusion. No change from the earlier chest CTA. Hepatobiliary: Subtle liver lesions suspicious for metastatic disease. Largest lies in the right lobe, segment 6,  1.4 cm in size. Liver normal in size and overall attenuation. Mildly distended gallbladder. Increased attenuation material in the dependent gallbladder possibly small stones or sludge. No wall thickening or inflammation. No bile duct dilation. Pancreas: Unremarkable. No pancreatic ductal dilatation or surrounding inflammatory changes. Spleen: Normal in size without focal abnormality. Adrenals/Urinary Tract: Bilateral adrenal masses, right 2.1 x 1.4 cm, left 1.1 cm size. Symmetric renal enhancement and excretion. No renal masses, stones or hydronephrosis. Normal ureters. Bladder minimally distended. Wall appears thickened. No bladder mass. Stomach/Bowel: Normal stomach. Small bowel and colon are normal in caliber. No wall thickening. No inflammation. Normal appendix suggested. No evidence of appendicitis. Vascular/Lymphatic: Mild aortic atherosclerosis.  No aneurysm. Prominent lymph nodes above the celiac axis, largest 1.2 cm in short axis. Prominent but subcentimeter retroperitoneal lymph nodes. No other enlarged nodes. Reproductive: Significant prostate enlargement elevating the bladder base. Prostate measures 6.9 cm superior to inferior by 5.8 x 5.6 cm transversely. Other: No abdominal wall hernia or abnormality. No abdominopelvic ascites. Musculoskeletal: Small area of sclerosis in the right ilium, likely a bone island. More definitive bone island noted in the anterior left femoral head. Vague sclerotic lesion along the lower endplate of T9. No osteolytic lesions. No fracture or acute finding. IMPRESSION: 1. Subtle low-density liver lesions consistent with metastatic disease. 2. Bilateral adrenal masses suspicious for metastatic disease. 3. Prominent lymph nodes above the celiac axis suspicious for metastatic disease. 4. Subtle areas of sclerosis, right ilium and lower endplate of T9, nonspecific, possibly metastatic disease. 5. No acute findings. Electronically Signed   By: Lajean Manes M.D.   On: 05/15/2021  17:56   MR LIVER W WO CONTRAST  Result Date: 05/18/2021 CLINICAL DATA:  Inpatient. Acute respiratory failure with infiltrative left upper lobe soft tissue and bilateral mediastinal adenopathy suspicious on chest CT for lung malignancy. Indeterminate liver and bilateral adrenal lesions on CT abdomen study. EXAM: MRI ABDOMEN WITHOUT AND WITH CONTRAST TECHNIQUE: Multiplanar multisequence MR imaging of the abdomen was performed both before and after the administration of intravenous contrast. CONTRAST:  17mL GADAVIST GADOBUTROL 1 MMOL/ML IV SOLN COMPARISON:  05/15/2021 CT abdomen/pelvis and chest CT  angiogram. FINDINGS: Substantially motion degraded exam, limiting assessment. Lower chest: Small right and trace left dependent pleural effusions. Geographic consolidation in the visualized lingula and right middle lobe. Hepatobiliary: Normal liver size and configuration. No hepatic steatosis. There are several (at least 5) mildly T2 hyperintense T1 hypointense hypoenhancing liver masses scattered throughout the liver with restricted diffusion, compatible with metastatic disease, largest 1.5 x 1.3 cm in the segment 6 right liver (series 4/image 20), 1.3 x 1.1 cm in the anterior segment 4 left liver (series 4/image 18) and 1.1 x 1.1 cm in the segment 8 right liver (series 4/image 9). Normal gallbladder with no cholelithiasis. No biliary ductal dilatation. Common bile duct diameter 4 mm. No choledocholithiasis. No biliary masses, strictures or beading. Pancreas: No pancreatic mass or duct dilation.  No pancreas divisum. Spleen: Normal size. No mass. Adrenals/Urinary Tract: Enhancing right adrenal 2.5 x 1.7 cm nodule (series 3/image 18) demonstrates no significant loss of signal intensity on chemical shift imaging and is indeterminate. Similar enhancing left adrenal 1.5 x 1.3 cm nodule (series 3/image 23), too small to characterize for intralesional lipid on the motion degraded chemical shift images. No hydronephrosis.  Scattered bilateral simple renal cysts, largest 1.6 cm in the anterior upper left kidney. T2 hypointense T1 hyperintense 1.1 cm renal cortical lesion in the lower right kidney (series 4/image 33) without compelling enhancement on the motion degraded postcontrast sequences, compatible with hemorrhagic/proteinaceous Bosniak category 2 renal cyst. No overtly suspicious renal masses. Stomach/Bowel: Normal non-distended stomach. Visualized small and large bowel is normal caliber, with no bowel wall thickening. Vascular/Lymphatic: Normal caliber abdominal aorta. Patent portal, splenic, hepatic and renal veins. A few mildly enlarged left para-aortic nodes, largest 1.2 cm short axis diameter (series 18/image 66). Enlarged 1.5 cm high left retroperitoneal node anterior to the left diaphragmatic crus (series 18/image 37). Other: No abdominal ascites or focal fluid collection. Musculoskeletal: Enhancing inferior T9 vertebral 2.8 cm and right T10 vertebral 1.1 cm bone lesions (series 17/image 19). Enhancing 2.1 cm medial right iliac bone lesion (series 17/image 19). IMPRESSION: 1. Limited motion degraded scan. 2. Several (at least 5) hypoenhancing liver masses scattered throughout the liver, with MRI features compatible with metastatic disease. Percutaneous biopsy of the 1.5 cm segment 6 right liver or 1.3 cm anterior segment 4 left liver lesions could be considered. 3. Indeterminate enhancing bilateral adrenal nodules, measuring 2.5 cm on the right and 1.5 cm on the left. Metastatic disease is not excluded. 4. Left para-aortic and high left retroperitoneal lymphadenopathy suspicious for metastatic disease. 5. Enhancing T9 and T10 vertebral and medial right iliac bone lesions, suspicious for bone metastases. 6. Small right and trace left dependent pleural effusions. Geographic consolidation in the visualized lingula and right middle lobe, nonspecific, please see recent chest CT angiogram study for details. Electronically Signed    By: Ilona Sorrel M.D.   On: 05/18/2021 16:39   DG Chest Port 1 View  Result Date: 06/13/2021 CLINICAL DATA:  Shortness of breath.  Recent pneumonia. EXAM: PORTABLE CHEST 1 VIEW COMPARISON:  Radiograph and chest CT 06/07/2021. FINDINGS: Right chest port remains in place. Similar volume loss in the left hemithorax with diffusely increased opacity, opacity throughout the left upper lobe with seen on recent CT. Opacity at the right lung base corresponds to right middle lobe collapse. Background bronchial thickening. Trace pleural effusions on CT are not well demonstrated by radiograph. No pneumothorax or significant change from prior imaging. Heart size is grossly stable. IMPRESSION: 1. No significant change from prior imaging.  2. Volume loss in the left hemithorax with diffusely increased opacity throughout the left upper lung zone, upper lobe consolidation seen on recent CT. 3. Right lung base opacity corresponds to right middle lobe collapse. 4. Background bronchial thickening. Electronically Signed   By: Keith Rake M.D.   On: 06/13/2021 18:36   DG Chest Portable 1 View  Result Date: 06/07/2021 CLINICAL DATA:  Cough, shortness of breath. EXAM: PORTABLE CHEST 1 VIEW COMPARISON:  May 14, 2021. FINDINGS: Stable cardiomediastinal silhouette. Interval placement of right internal jugular Port-A-Cath with distal tip in expected position of the SVC. No pneumothorax is noted. Left upper lobe consolidation is noted concerning for pneumonia or atelectasis. Probable central pulmonary vascular congestion is noted. Bony thorax is unremarkable. IMPRESSION: Left upper lobe opacity is noted concerning for pneumonia or atelectasis. Electronically Signed   By: Marijo Conception M.D.   On: 06/07/2021 12:13   ECHOCARDIOGRAM COMPLETE  Result Date: 06/08/2021    ECHOCARDIOGRAM REPORT   Patient Name:   SHALEV HELMINIAK Date of Exam: 06/08/2021 Medical Rec #:  [id number]      Height:       69.0 in Accession #:    [id number]      Weight:       220.7 lb Date of Birth:  1955-11-22      BSA:          2.154 m Patient Age:    62 years       BP:           130/81 mmHg Patient Gender: M              HR:           108 bpm. Exam Location:  [facility] Procedure: 2D Echo, Cardiac Doppler, Color Doppler and Strain Analysis Indications:    CHF-acute diastolic I96.78  History:        Patient has no prior history of Echocardiogram examinations.                 COPD.  Sonographer:    Sherrie Sport Referring Phys: [id number] Ivor Costa Diagnosing      Kate Sable MD Phys:  Sonographer Comments: Suboptimal apical window. Global longitudinal strain was attempted. IMPRESSIONS  1. Left ventricular ejection fraction, by estimation, is 65 to 70%. The left ventricle has normal function. The left ventricle has no regional wall motion abnormalities. Left ventricular diastolic parameters are consistent with Grade I diastolic dysfunction (impaired relaxation).  2. Right ventricular systolic function is normal. The right ventricular size is mildly enlarged.  3. A small pericardial effusion is present.  4. The mitral valve is normal in structure. No evidence of mitral valve regurgitation.  5. The aortic valve is tricuspid. Aortic valve regurgitation is not visualized. FINDINGS  Left Ventricle: Left ventricular ejection fraction, by estimation, is 65 to 70%. The left ventricle has normal function. The left ventricle has no regional wall motion abnormalities. Global longitudinal strain performed but not reported based on interpreter judgement due to suboptimal tracking. 3D left ventricular ejection fraction analysis performed but not reported based on interpreter judgement due to suboptimal quality. The left ventricular internal cavity size was normal in size. There is no left ventricular hypertrophy. Left ventricular diastolic parameters are consistent with Grade I diastolic dysfunction (impaired relaxation). Right Ventricle: The right ventricular size is mildly enlarged. No  increase in right ventricular wall thickness. Right ventricular systolic function is normal. Left Atrium: Left atrial size was normal in size. Right  Atrium: Right atrial size was normal in size. Pericardium: A small pericardial effusion is present. Mitral Valve: The mitral valve is normal in structure. No evidence of mitral valve regurgitation. Tricuspid Valve: The tricuspid valve is grossly normal. Tricuspid valve regurgitation is trivial. Aortic Valve: The aortic valve is tricuspid. Aortic valve regurgitation is not visualized. Aortic valve mean gradient measures 6.0 mmHg. Aortic valve peak gradient measures 10.5 mmHg. Aortic valve area, by VTI measures 2.89 cm. Pulmonic Valve: The pulmonic valve was not well visualized. Pulmonic valve regurgitation is not visualized. Aorta: The aortic root is normal in size and structure. IAS/Shunts: No atrial level shunt detected by color flow Doppler.  LEFT VENTRICLE PLAX 2D LVIDd:         4.80 cm  Diastology LVIDs:         2.60 cm  LV e' medial:   1.96 cm/s LV PW:         1.30 cm  LV E/e' medial: 38.7 LV IVS:        1.20 cm LVOT diam:     2.10 cm LV SV:         76 LV SV Index:   35 LVOT Area:     3.46 cm 3D Volume EF:                         3D EF:        52 %                         LV EDV:       96 ml                         LV ESV:       46 ml                         LV SV:        50 ml RIGHT VENTRICLE RV S prime:     14.00 cm/s TAPSE (M-mode): 5.7 cm LEFT ATRIUM             Index       RIGHT ATRIUM           Index LA diam:        3.30 cm 1.53 cm/m  RA Area:     21.00 cm LA Vol (A2C):   64.2 ml 29.80 ml/m RA Volume:   55.60 ml  25.81 ml/m LA Vol (A4C):   31.2 ml 14.48 ml/m LA Biplane Vol: 45.4 ml 21.07 ml/m  AORTIC VALVE                    PULMONIC VALVE AV Area (Vmax):    2.39 cm     PV Vmax:       0.73 m/s AV Area (Vmean):   2.78 cm     PV Peak grad:  2.1 mmHg AV Area (VTI):     2.89 cm AV Vmax:           162.00 cm/s AV Vmean:          108.000 cm/s AV VTI:             0.261 m AV Peak Grad:      10.5 mmHg AV Mean Grad:      6.0 mmHg LVOT Vmax:  112.00 cm/s LVOT Vmean:        86.800 cm/s LVOT VTI:          0.218 m LVOT/AV VTI ratio: 0.84  AORTA Ao Root diam: 3.30 cm MITRAL VALVE                TRICUSPID VALVE MV Area (PHT): 3.95 cm     TR Peak grad:   30.0 mmHg MV Decel Time: 192 msec     TR Vmax:        274.00 cm/s MV E velocity: 75.80 cm/s MV A velocity: 119.00 cm/s  SHUNTS MV E/A ratio:  0.64         Systemic VTI:  0.22 m                             Systemic Diam: 2.10 cm Kate Sable MD Electronically signed by Kate Sable MD Signature Date/Time: 06/08/2021/3:10:53 PM    Final    [place] CORE BIOPSY (LYMPH NODES)  Result Date: 05/20/2021 INDICATION: Possible lung carcinoma by prior imaging with vague liver lesions and also enlarged left supraclavicular lymph node by CT. Due to oxygen requirement and poor respiratory status, biopsy of the left supraclavicular lymph node is performed without sedation as tolerance a liver biopsy currently with sedation would be borderline and potentially high risk. EXAM: ULTRASOUND GUIDED CORE BIOPSY OF LEFT SUPRACLAVICULAR LYMPH NODE MEDICATIONS: None. ANESTHESIA/SEDATION: None PROCEDURE: The procedure, risks, benefits, and alternatives were explained to the patient. Questions regarding the procedure were encouraged and answered. The patient understands and consents to the procedure. A time-out was performed prior to initiating the procedure. The left neck was prepped with chlorhexidine in a sterile fashion, and a sterile drape was applied covering the operative field. A sterile gown and sterile gloves were used for the procedure. Local anesthesia was provided with 1% Lidocaine. After localizing an enlarged left supraclavicular lymph node, an 18 gauge core biopsy device was utilized in obtaining 4 separate core biopsy samples. Material was submitted in formalin. Additional ultrasound was performed. COMPLICATIONS: None  immediate. FINDINGS: Several left supraclavicular and lower cervical lymph nodes are identified. The largest measures approximately 1.7 x 1.3 x 1.4 cm by ultrasound. Solid core biopsy samples were obtained from this lymph node. IMPRESSION: Ultrasound-guided core biopsy performed enlarged left supraclavicular lymph node. Electronically Signed   By: Aletta Edouard M.D.   On: 05/20/2021 11:02   IR IMAGING GUIDED PORT INSERTION  Result Date: 06/03/2021 INDICATION: Metastatic lung cancer.  Chemotherapy. EXAM: IMPLANTED PORT A CATH PLACEMENT WITH ULTRASOUND AND FLUOROSCOPIC GUIDANCE MEDICATIONS: None ANESTHESIA/SEDATION: Moderate (conscious) sedation was employed during this procedure. A total of Versed 0 mg and Fentanyl 25 mcg was administered intravenously. Moderate Sedation Time: 20 minutes. The patient's level of consciousness and vital signs were monitored continuously by radiology nursing throughout the procedure under my direct supervision. FLUOROSCOPY TIME:  0 minutes, 24 seconds (4.5 mGy) COMPLICATIONS: None immediate. PROCEDURE: The procedure, risks, benefits, and alternatives were explained to the patient. Questions regarding the procedure were encouraged and answered. The patient understands and consents to the procedure. The neck and chest were prepped with chlorhexidine in a sterile fashion, and a sterile drape was applied covering the operative field. Maximum barrier sterile technique with sterile gowns and gloves were used for the procedure. A timeout was performed prior to the initiation of the procedure. Local anesthesia was provided with 1% lidocaine with epinephrine. After creating a small venotomy incision,  a micropuncture kit was utilized to access the internal jugular vein under direct, real-time ultrasound guidance. Ultrasound image documentation was performed. The microwire was kinked to measure appropriate catheter length. A subcutaneous port pocket was then created along the upper chest wall  utilizing a combination of sharp and blunt dissection. The pocket was irrigated with sterile saline. A single lumen ISP power injectable port was chosen for placement. The 8 Fr catheter was tunneled from the port pocket site to the venotomy incision. The port was placed in the pocket. The external catheter was trimmed to appropriate length. At the venotomy, an 8 Fr peel-away sheath was placed over a guidewire under fluoroscopic guidance. The catheter was then placed through the sheath and the sheath was removed. Final catheter positioning was confirmed and documented with a fluoroscopic spot radiograph. The port was accessed with a Huber needle, aspirated and flushed with heparinized saline. The port pocket incision was closed with interrupted 3-0 Vicryl suture then Dermabond was applied, including at the venotomy incision. Dressings were placed. The patient tolerated the procedure well without immediate post procedural complication. IMPRESSION: Successful placement of a right internal jugular approach power injectable Port-A-Cath. The catheter is ready for immediate use. Michaelle Birks, MD [facility] Radiology Electronically Signed   By: Michaelle Birks M.D.   On: 06/03/2021 13:26    PERFORMANCE STATUS (ECOG) : 2 - Symptomatic, <50% confined to bed  Review of Systems Unless otherwise noted, a complete review of systems is negative.  Physical Exam General: NAD Pulmonary: Exertionally labored Extremities: no edema, no joint deformities Skin: no rashes Neurological: Weakness but otherwise nonfocal  IMPRESSION: Patient known to me from his recent hospitalization.  He is currently in the ER and says that his breathing is already improved.  He reports it is close to baseline, although his speech appears broken due to dyspnea.  I again discussed overall goals with patient.  He feels strongly that chemotherapy will improve his overall condition.  He verbalized  frustration that treatment has been delayed due to his hospitalizations.  I had a candid discussion regarding my concern that his advanced COPD might limit treatment options and increase his risk of recurrent hospitalization.  However, he remains committed to pursuing treatment.  I again discussed CODE STATUS.  Patient says that he would want resuscitative efforts if "there is a chance" but not of care were futile.  I explained in my opinion that he is at high risk of ventilator dependence given his advanced COPD/lung cancer and would unlikely to have meaningful improvement with CPR/defibrillation.  I encouraged him to consider these decisions and speak with his brother.  PLAN: -Continue current scope of treatment -Will plan to follow patient in the palliative care clinic after discharge from the hospital  Case and plan discussed with Dr. Grayland Ormond    Time Total: 60 minutes  Visit consisted of counseling and education dealing with the complex and emotionally intense issues of symptom management and palliative care in the setting of serious and potentially life-threatening illness.Greater than 50%  of this time was spent counseling and coordinating care related to the above assessment and plan.  Signed by: Altha Harm, PhD, NP-C

## 2021-06-14 NOTE — Progress Notes (Addendum)
[facility] PROGRESS NOTE   Derek Clarke GNF:[id number] DOB: 1956/01/02 DOA: 06/13/2021  PCP: Pcp, No  Brief History/Interval Summary:  65 y.o. 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    The patient was recently hospitalized at [facility] from 06/07/2021 06/09/2021 for acute on chronic hypoxic respiratory failure in the setting of acute COPD exacerbation.  During his previous hospitalization, he underwent CTA chest on 06/24/2021 which was notable for no evidence of acute pulmonary embolism, but showed persistent left hilar mass, with interval increase in consolidation of the left upper lobe, felt to represent either progression of malignancy versus postobstructive changes, while showing trace bilateral pleural effusions.  He also underwent echocardiogram on 06/08/2021 notable for LVEF 65 to 70%, no focal wall motion maladies, will showing grade 1 diastolic dysfunction, mildly enlarged right ventricle, and small pericardial effusion in the absence of any significant valvular pathology.   Patient was subsequently hospitalized for further management of his dyspnea.  Consultants: We will consult medical oncology  Procedures: None yet    Subjective/Interval History: Patient continues to have difficulty breathing.  Denies any chest pain.  Occasional cough which is dry.  No nausea vomiting.  Complains of swelling in the legs.   Assessment/Plan:  Acute on chronic respiratory failure with hypoxia/COPD exacerbation Symptoms thought to be multifactorial including COPD exacerbation along with his history of lung cancer.  Recent hospitalization for similar problems.   Patient has been started on nebulizer treatments.  He is also been started on steroids and is on Levaquin.  His oxygen requirements are about the same as baseline which is around 4 L/min.  Procalcitonin less than 0.1.  Lactic acid level 2.2.  Troponin mildly elevated likely of no clinical significance.  COVID-19 and influenza PCR's were negative.  Patient does have pedal edema.  Echocardiogram done in September showed normal systolic function with grade 1 diastolic dysfunction.  Could give him a dose of furosemide as well.  Looks like he did get a dose of IV Lasix in the emergency department.  We will order another dose today.  Stage IV adenocarcinoma left lung Followed by medical oncology.  Patient tells me that there is plan to initiate chemotherapy this Thursday.  Will involve medical oncology in patient's care.  He recently underwent a PET scan as well.  Dysphagia Patient having difficulty swallowing.  Recent CT scan showed wall thickening of the esophagus but no other abnormality was noted.  Will initiate PPI.  Currently patient is NPO.  Speech therapy consultation has been requested.  We will also follow-up on the results of the PET scan. May need to consider doing barium swallow once patient has been seen by speech therapy.  Generalized anxiety disorder Continue home medications.  Obesity Estimated body mass index is 32.56 kg/m as calculated from the following:   Height as of this encounter: 5\' 9"  (1.753 m).   Weight as of this encounter: 100 kg.   DVT Prophylaxis: Will initiate Lovenox Code Status: Full code for now Family Communication: No family at bedside.  Discussed with patient Disposition Plan: Lives by himself.  Hopefully return home in improved.  PT and OT evaluation.  Status is: Inpatient  Remains inpatient appropriate because:IV  treatments appropriate due to intensity of illness or inability to take PO and Inpatient level of care appropriate due to severity of  illness  Dispo: The patient is from: Home              Anticipated d/c is to: Home              Patient currently is not medically stable to d/c.   Difficult to place patient No      Medications: Scheduled:  aspirin EC  325 mg Oral Once   feeding supplement  237 mL Oral BID BM   ipratropium-albuterol  3 mL Nebulization Q6H   methylPREDNISolone (SOLU-MEDROL) injection  80 mg Intravenous Q12H   Continuous:  levofloxacin (LEVAQUIN) IV Stopped (06/13/21 2333)   LKT:[id number] **OR** acetaminophen, albuterol, LORazepam, ondansetron (ZOFRAN) IV  Antibiotics: Anti-infectives (From admission, onward)    Start     Dose/Rate Route Frequency Ordered Stop   06/13/21 2200  levofloxacin (LEVAQUIN) IVPB 750 mg        750 mg 100 mL/hr over 90 Minutes Intravenous Every 24 hours 06/13/21 2105 06/18/21 2159       Objective:  Vital Signs  Vitals:   06/13/21 2024 06/13/21 2357 06/14/21 0049 06/14/21 0545  BP:  103/76 107/77 111/79  Pulse:  (!) 104 95 (!) 101  Resp:  (!) 25 18 20   Temp:  97.8 F (36.6 C) 98 F (36.7 C) 98.2 F (36.8 C)  TempSrc:  Oral Oral Oral  SpO2: 98% 95% 93% 94%  Weight:      Height:        Intake/Output Summary (Last 24 hours) at 06/14/2021 0855 Last data filed at 06/13/2021 2333 Gross per 24 hour  Intake 135.55 ml  Output --  Net 135.55 ml   Filed Weights   06/13/21 1733  Weight: 100 kg    General appearance: Awake alert.  In no distress Resp: Very diminished air entry on the left side.  Few crackles in the right base.  No rhonchi.  Occasional wheezing. Cardio: S1-S2 is normal regular.  No S3-S4.  No rubs murmurs or bruit GI: Abdomen is soft.  Nontender nondistended.  Bowel sounds are present normal.  No masses organomegaly Extremities: Pedal edema bilateral lower extremities. Neurologic: Alert and oriented x3.  No focal neurological deficits.    Lab Results:  Data Reviewed: I have personally reviewed following labs and imaging  studies  CBC: Recent Labs  Lab 06/07/21 1050 06/08/21 0728 06/13/21 1750 06/14/21 0420  WBC 10.1 11.8* 15.9* 10.8*  NEUTROABS  --   --   --  10.2*  HGB 15.0 13.6 15.4 14.7  HCT 44.4 40.8 45.3 43.6  MCV 83.1 82.8 84.7 81.8  PLT 259 226 266 768    Basic Metabolic Panel: Recent Labs  Lab 06/07/21 1050 06/08/21 0728 06/13/21 1750 06/13/21 2347 06/14/21 0420  NA 135  --  135  --  134*  K 4.3  --  4.1  --  3.9  CL 92*  --  92*  --  92*  CO2 28  --  31  --  31  GLUCOSE 103*  --  106*  --  145*  BUN 18  --  19  --  21  CREATININE 0.70  --  0.81  --  0.65  CALCIUM 9.6  --  9.5  --  9.4  MG  --  2.4  --  2.2 2.2  PHOS  --   --   --   --  4.4    GFR: Estimated Creatinine Clearance: 108.7 mL/min (by C-G formula based on SCr of 0.65 mg/dL).  Liver Function Tests: Recent Labs  Lab 06/07/21 1050 06/14/21 0420  AST 25 23  ALT 19 26  ALKPHOS 109 111  BILITOT 1.1 0.9  PROT 7.2 6.7  ALBUMIN 3.3* 3.3*    Recent Labs  Lab 06/07/21 1050  LIPASE 32    Coagulation Profile: Recent Labs  Lab 06/07/21 1809  INR 1.0    CBG: Recent Labs  Lab 06/13/21 1347  GLUCAP 122*      Recent Results (from the past 240 hour(s))  Resp Panel by RT-PCR (Flu A&B, Covid) Nasopharyngeal Swab     Status: None   Collection Time: 06/07/21 11:21 AM   Specimen: Nasopharyngeal Swab; Nasopharyngeal(NP) swabs in vial transport medium  Result Value Ref Range Status   SARS Coronavirus 2 by RT PCR NEGATIVE NEGATIVE Final    Comment: (NOTE) SARS-CoV-2 target nucleic acids are NOT DETECTED.  The SARS-CoV-2 RNA is generally detectable in upper respiratory specimens during the acute phase of infection. The lowest concentration of SARS-CoV-2 viral copies this assay can detect is 138 copies/mL. A negative result does not preclude SARS-Cov-2 infection and should not be used as the sole basis for treatment or other patient management decisions. A negative result may occur with  improper  specimen collection/handling, submission of specimen other than nasopharyngeal swab, presence of viral mutation(s) within the areas targeted by this assay, and inadequate number of viral copies(<138 copies/mL). A negative result must be combined with clinical observations, patient history, and epidemiological information. The expected result is Negative.  Fact Sheet for Patients:  EntrepreneurPulse.com.au  Fact Sheet for Healthcare Providers:  IncredibleEmployment.be  This test is no t yet approved or cleared by the [place] FDA and  has been authorized for detection and/or diagnosis of SARS-CoV-2 by FDA under an Emergency Use Authorization (EUA). This EUA will remain  in effect (meaning this test can be used) for the duration of the COVID-19 declaration under Section 564(b)(1) of the Act, 21 U.S.C.section 360bbb-3(b)(1), unless the authorization is terminated  or revoked sooner.       Influenza A by PCR NEGATIVE NEGATIVE Final   Influenza B by PCR NEGATIVE NEGATIVE Final    Comment: (NOTE) The Xpert Xpress SARS-CoV-2/FLU/RSV plus assay is intended as an aid in the diagnosis of influenza from Nasopharyngeal swab specimens and should not be used as a sole basis for treatment. Nasal washings and aspirates are unacceptable for Xpert Xpress SARS-CoV-2/FLU/RSV testing.  Fact Sheet for Patients: EntrepreneurPulse.com.au  Fact Sheet for Healthcare Providers: IncredibleEmployment.be  This test is not yet approved or cleared by the [place] FDA and has been authorized for detection and/or diagnosis of SARS-CoV-2 by FDA under an Emergency Use Authorization (EUA). This EUA will remain in effect (meaning this test can be used) for the duration of the COVID-19 declaration under Section 564(b)(1) of the Act, 21 U.S.C. section 360bbb-3(b)(1), unless the authorization is terminated or revoked.  Performed at  [facility], [address] [place], [place] [address]   Blood culture (routine x 2)     Status: None   Collection Time: 06/07/21  1:01 PM   Specimen: BLOOD  Result Value Ref Range Status   Specimen Description BLOOD LEFT ANTECUBITAL  Final   Special Requests   Final    BOTTLES DRAWN AEROBIC AND ANAEROBIC Blood Culture results may not be optimal due to an inadequate volume of blood received  in culture bottles   Culture   Final    NO GROWTH 5 DAYS Performed at [facility], [address] [place], [place] [address]    Report Status 06/12/2021 FINAL  Final  Blood culture (routine x 2)     Status: None   Collection Time: 06/07/21  1:21 PM   Specimen: BLOOD  Result Value Ref Range Status   Specimen Description BLOOD BLOOD LEFT HAND  Final   Special Requests   Final    BOTTLES DRAWN AEROBIC ONLY Blood Culture results may not be optimal due to an inadequate volume of blood received in culture bottles   Culture   Final    NO GROWTH 5 DAYS Performed at [facility], [address], [place], [place] [address]    Report Status 06/12/2021 FINAL  Final  Resp Panel by RT-PCR (Flu A&B, Covid) Nasopharyngeal Swab     Status: None   Collection Time: 06/13/21  8:27 PM   Specimen: Nasopharyngeal Swab; Nasopharyngeal(NP) swabs in vial transport medium  Result Value Ref Range Status   SARS Coronavirus 2 by RT PCR NEGATIVE NEGATIVE Final    Comment: (NOTE) SARS-CoV-2 target nucleic acids are NOT DETECTED.  The SARS-CoV-2 RNA is generally detectable in upper respiratory specimens during the acute phase of infection. The lowest concentration of SARS-CoV-2 viral copies this assay can detect is 138 copies/mL. A negative result does not preclude SARS-Cov-2 infection and should not be used as the sole basis for treatment or other patient management decisions. A negative result may occur with  improper specimen collection/handling, submission of specimen other than  nasopharyngeal swab, presence of viral mutation(s) within the areas targeted by this assay, and inadequate number of viral copies(<138 copies/mL). A negative result must be combined with clinical observations, patient history, and epidemiological information. The expected result is Negative.  Fact Sheet for Patients:  EntrepreneurPulse.com.au  Fact Sheet for Healthcare Providers:  IncredibleEmployment.be  This test is no t yet approved or cleared by the [place] FDA and  has been authorized for detection and/or diagnosis of SARS-CoV-2 by FDA under an Emergency Use Authorization (EUA). This EUA will remain  in effect (meaning this test can be used) for the duration of the COVID-19 declaration under Section 564(b)(1) of the Act, 21 U.S.C.section 360bbb-3(b)(1), unless the authorization is terminated  or revoked sooner.       Influenza A by PCR NEGATIVE NEGATIVE Final   Influenza B by PCR NEGATIVE NEGATIVE Final    Comment: (NOTE) The Xpert Xpress SARS-CoV-2/FLU/RSV plus assay is intended as an aid in the diagnosis of influenza from Nasopharyngeal swab specimens and should not be used as a sole basis for treatment. Nasal washings and aspirates are unacceptable for Xpert Xpress SARS-CoV-2/FLU/RSV testing.  Fact Sheet for Patients: EntrepreneurPulse.com.au  Fact Sheet for Healthcare Providers: IncredibleEmployment.be  This test is not yet approved or cleared by the [place] FDA and has been authorized for detection and/or diagnosis of SARS-CoV-2 by FDA under an Emergency Use Authorization (EUA). This EUA will remain in effect (meaning this test can be used) for the duration of the COVID-19 declaration under Section 564(b)(1) of the Act, 21 U.S.C. section 360bbb-3(b)(1), unless the authorization is terminated or revoked.  Performed at [facility], [address] [place], [place]  [address]       Radiology Studies: [facility] Chest Cope 1 View  Result Date: 06/13/2021 CLINICAL DATA:  Shortness of breath.  Recent pneumonia. EXAM: PORTABLE CHEST 1 VIEW COMPARISON:  Radiograph and chest CT 06/07/2021. FINDINGS: Right chest port remains in place. Similar volume loss in the left hemithorax with diffusely increased opacity, opacity throughout the left upper lobe with seen on recent CT. Opacity at the right lung base corresponds to right middle lobe collapse. Background bronchial thickening. Trace pleural effusions on CT are not well demonstrated by radiograph. No pneumothorax or significant change from prior imaging. Heart size is grossly stable. IMPRESSION: 1. No significant change from prior imaging. 2. Volume loss in the left hemithorax with diffusely increased opacity throughout the left upper lung zone, upper lobe consolidation seen on recent CT. 3. Right lung base opacity corresponds to right middle lobe collapse. 4. Background bronchial thickening. Electronically Signed   By: Keith Rake M.D.   On: 06/13/2021 18:36       LOS: 1 day   [facility] Pager on [email or web address]  06/14/2021, 8:55 AM

## 2021-06-14 NOTE — Consult Note (Signed)
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  INTERVAL HISTORY: Patient is a 65 year old male who was initially scheduled to start treatment later this week for the above-stated malignancy.  In the past several days, his baseline shortness of breath became acutely worse upon presentation to the emergency room.  He continues to have significant shortness of breath, but feels improved since his arrival.  He has no neurologic complaints.  He denies any recent fevers.  He has a good appetite and denies weight loss.  He has no chest pain or hemoptysis.  He has no nausea, vomiting, constipation, or diarrhea.  He has no urinary complaints.  Patient otherwise feels well and offers no further specific complaints today.  REVIEW OF SYSTEMS:   Review of Systems  Constitutional:  Positive for malaise/fatigue. Negative for fever and weight loss.  Respiratory:  Positive for cough and shortness of breath. Negative for hemoptysis.   Cardiovascular: Negative.  Negative for chest pain and leg swelling.  Gastrointestinal: Negative.  Negative for nausea.  Genitourinary: Negative.  Negative for dysuria.  Musculoskeletal: Negative.  Negative for back pain.  Skin:  Negative for rash.  Neurological:  Positive for weakness. Negative for dizziness, focal weakness and headaches.  Psychiatric/Behavioral: Negative.  The patient is not nervous/anxious.    As per HPI. Otherwise, a complete review of systems is negative.  PAST MEDICAL HISTORY: Past Medical History:  Diagnosis Date   Adenocarcinoma ([place])    lung; stage 4 with mets to liver   Chronic respiratory failure with hypoxia (HCC)    baseline 4 L [place]   COPD (chronic obstructive pulmonary disease)  (HCC)    GAD (generalized anxiety disorder)     PAST SURGICAL HISTORY: Past Surgical History:  Procedure Laterality Date   IR IMAGING GUIDED PORT INSERTION  06/03/2021    FAMILY HISTORY: Family History  Problem Relation Age of Onset   Lung disease Mother    Dementia Father     ADVANCED DIRECTIVES (Y/N):  $Remov'@[id number]'[id number]$ @  HEALTH MAINTENANCE: Social History   Tobacco Use   Smoking status: Former    Packs/day: 1.00    Years: 30.00    Pack years: 30.00    Types: Cigarettes    Quit date: 03/26/2021    Years since quitting: 0.2   Smokeless tobacco: Never  Substance Use Topics   Alcohol use: Yes   Drug use: Yes    Types: Marijuana    Comment: quit approx. 5 years ago     Colonoscopy:  PAP:  Bone density:  Lipid panel:  No Known Allergies  Current Facility-Administered Medications  Medication Dose Route Frequency Provider Last Rate Last Admin   acetaminophen (TYLENOL) tablet 650 mg  650 mg Oral Q6H PRN Howerter, Justin B, DO       Or   acetaminophen (TYLENOL) suppository 650 mg  650 mg Rectal Q6H PRN Howerter, Justin B, DO       albuterol (PROVENTIL) (2.5 MG/3ML) 0.083% nebulizer solution 2.5 mg  2.5 mg Nebulization Q4H PRN Howerter, Justin B, DO   2.5 mg at 06/13/21 2203   aspirin EC tablet 325 mg  325 mg Oral Once Howerter, Justin B, DO       enoxaparin (LOVENOX) injection 40 mg  40 mg Subcutaneous  Q24H Bonnielee Haff, MD   40 mg at 06/14/21 5035   feeding supplement (NEPRO CARB STEADY) liquid 237 mL  237 mL Oral TID BM Bonnielee Haff, MD       ipratropium-albuterol (DUONEB) 0.5-2.5 (3) MG/3ML nebulizer solution 3 mL  3 mL Nebulization Q6H Howerter, Justin B, DO   3 mL at 06/14/21 1559   levofloxacin (LEVAQUIN) IVPB 750 mg  750 mg Intravenous Q24H Howerter, Justin B, DO   Stopped at 06/13/21 2333   LORazepam (ATIVAN) injection 0.5 mg  0.5 mg Intravenous Q4H PRN Howerter, Justin B, DO   0.5 mg at 06/13/21 2209   methylPREDNISolone sodium succinate (SOLU-MEDROL) 125 mg/2 mL  injection 80 mg  80 mg Intravenous Q12H Howerter, Justin B, DO   80 mg at 06/14/21 1559   ondansetron (ZOFRAN) injection 4 mg  4 mg Intravenous Q6H PRN Howerter, Justin B, DO       Current Outpatient Medications  Medication Sig Dispense Refill   albuterol (VENTOLIN HFA) 108 (90 Base) MCG/ACT inhaler Inhale 2 puffs into the lungs every 6 (six) hours as needed for wheezing or shortness of breath. 8 g 2   ALPRAZolam (XANAX) 0.5 MG tablet Take 1 tablet (0.5 mg total) by mouth 2 (two) times daily as needed for anxiety. 60 tablet 0   busPIRone (BUSPAR) 5 MG tablet Take 1 tablet (5 mg total) by mouth 3 (three) times daily. 90 tablet 1   chlorpheniramine (CHLOR-TRIMETON) 4 MG tablet Take 4 mg by mouth 2 (two) times daily as needed for allergies.     diphenhydrAMINE (BENADRYL) 25 MG tablet Take 25 mg by mouth every 6 (six) hours as needed.     fluticasone furoate-vilanterol (BREO ELLIPTA) 100-25 MCG/INH AEPB Inhale 1 puff into the lungs daily. 1 each 1   guaiFENesin (MUCINEX) 600 MG 12 hr tablet Take 600 mg by mouth 2 (two) times daily as needed for cough or to loosen phlegm.     ibuprofen (ADVIL) 400 MG tablet Take 200 mg by mouth every 4 (four) hours as needed for mild pain.     Multiple Vitamin (MULTIVITAMIN WITH MINERALS) TABS tablet Take 1 tablet by mouth daily. 90 tablet 1   feeding supplement (ENSURE ENLIVE / ENSURE PLUS) LIQD Take 237 mLs by mouth 2 (two) times daily between meals. 237 mL 12   lidocaine-prilocaine (EMLA) cream Apply to affected area once 30 g 3   ondansetron (ZOFRAN) 8 MG tablet Take 1 tablet (8 mg total) by mouth 2 (two) times daily as needed for refractory nausea / vomiting. 60 tablet 2   prochlorperazine (COMPAZINE) 10 MG tablet Take 1 tablet (10 mg total) by mouth every 6 (six) hours as needed (Nausea or vomiting). 60 tablet 2    OBJECTIVE: Vitals:   06/14/21 0049 06/14/21 0545  BP: 107/77 111/79  Pulse: 95 (!) 101  Resp: 18 20  Temp: 98 F (36.7 C) 98.2 F (36.8 C)   SpO2: 93% 94%     Body mass index is 32.56 kg/m.    ECOG FS:2 - Symptomatic, <50% confined to bed  General: Well-developed, well-nourished, mild respiratory distress. Eyes: Pink conjunctiva, anicteric sclera. HEENT: Normocephalic, moist mucous membranes. Lungs: No audible wheezing. Heart: Regular rate and rhythm. Abdomen: Soft, nontender, no obvious distention. Musculoskeletal: No edema, cyanosis, or clubbing. Neuro: Alert, answering all questions appropriately. Cranial nerves grossly intact. Skin: No rashes or petechiae noted. Psych: Normal affect.  LAB RESULTS:  Lab Results  Component Value Date   NA 134 (  L) 06/14/2021   K 3.9 06/14/2021   CL 92 (L) 06/14/2021   CO2 31 06/14/2021   GLUCOSE 145 (H) 06/14/2021   BUN 21 06/14/2021   CREATININE 0.65 06/14/2021   CALCIUM 9.4 06/14/2021   PROT 6.7 06/14/2021   ALBUMIN 3.3 (L) 06/14/2021   AST 23 06/14/2021   ALT 26 06/14/2021   ALKPHOS 111 06/14/2021   BILITOT 0.9 06/14/2021   GFRNONAA >60 06/14/2021    Lab Results  Component Value Date   WBC 10.8 (H) 06/14/2021   NEUTROABS 10.2 (H) 06/14/2021   HGB 14.7 06/14/2021   HCT 43.6 06/14/2021   MCV 81.8 06/14/2021   PLT 268 06/14/2021     STUDIES: CT Angio Chest PE W and/or Wo Contrast  Result Date: 06/07/2021 CLINICAL DATA:  Tachycardia, vomiting, metastatic lung cancer EXAM: CT ANGIOGRAPHY CHEST WITH CONTRAST TECHNIQUE: Multidetector CT imaging of the chest was performed using the standard protocol during bolus administration of intravenous contrast. Multiplanar CT image reconstructions and MIPs were obtained to evaluate the vascular anatomy. CONTRAST:  35mL OMNIPAQUE IOHEXOL 350 MG/ML SOLN COMPARISON:  06/08/2011, 05/15/2021, FINDINGS: Cardiovascular: This is a technically adequate evaluation of the pulmonary vasculature. No filling defects or pulmonary emboli. Stable left hilar soft tissue mass encasing the left upper and left lower pulmonary arteries. Stable trace  pericardial effusion. The heart is not enlarged. No evidence of thoracic aortic aneurysm or dissection. Mild atherosclerosis of the aorta and coronary vessels. Mediastinum/Nodes: There is segmental wall thickening of the mid to distal thoracic esophagus. Confluent soft tissue within the mediastinum and bilateral hila consistent with adenopathy, not grossly changed since prior study. An enlarged left supraclavicular lymph node on image 9/4 measures 13 mm in short axis, unchanged. Thyroid and trachea are unremarkable. Lungs/Pleura: There are trace bilateral pleural effusions. Since the prior exam, there is progressive near complete consolidation of the left upper lobe, which may reflect progressive disease and/or postobstructive change. There is diffuse bilateral bronchial wall thickening, greatest at the lung bases. Continued right middle lobe collapse with occlusion of the right middle lobe bronchus again noted. Upper Abdomen: There are stable bilateral adrenal nodules, measuring up to 2.2 cm on the right. Multiple enlarged lymph nodes are again seen within the central upper abdomen, largest measuring 1.7 cm just posterior to the gastric cardia. Multiple hypodensities are again seen within the liver consistent with metastatic disease, grossly unchanged since prior study. Musculoskeletal: Stable sclerotic focus within the left T10 transverse process and within the T9 vertebral body, compatible with bony metastases. No acute displaced fractures. Reconstructed images demonstrate no additional findings. Review of the MIP images confirms the above findings. IMPRESSION: 1. No evidence of pulmonary embolus. 2. Persistent left hilar mass surrounding the left hilar vessels, with increasing near total consolidation of the left upper lobe since prior study. This may be progression of malignancy versus postobstructive change. 3. Continued coalescing lymphadenopathy throughout the mediastinum and bilateral hila. 4. Bilateral  bronchial wall thickening, greatest at the lung bases. 5. Trace bilateral pleural effusions. 6. Segmental wall thickening of the mid to distal thoracic esophagus, which could be inflammatory or infectious. 7. Upper abdominal metastases with continued multiple liver lesions, bilateral adrenal nodularity, and upper abdominal adenopathy. Electronically Signed   By: Randa Ngo M.D.   On: 06/07/2021 15:12   MR BRAIN W WO CONTRAST  Result Date: 05/18/2021 CLINICAL DATA:  Lung cancer diagnosis.  Staging. EXAM: MRI HEAD WITHOUT AND WITH CONTRAST TECHNIQUE: Multiplanar, multiecho pulse sequences of the brain and  surrounding structures were obtained without and with intravenous contrast. CONTRAST:  40mL GADAVIST GADOBUTROL 1 MMOL/ML IV SOLN COMPARISON:  None. FINDINGS: Brain: Study suffers from motion degradation. Diffusion imaging does not show any acute or subacute infarction or other cause of restricted diffusion. No evidence of chronic small vessel disease or old cortical infarction. Very few punctate foci of T2 and FLAIR signal within the white matter. No sign of mass lesion, hemorrhage, hydrocephalus or extra-axial collection. No abnormal contrast enhancement occurs. Vascular: Major vessels at the base of the brain show flow. Skull and upper cervical spine: Negative Sinuses/Orbits: Clear/normal Other: None IMPRESSION: No evidence of regional metastatic disease. Sensitivity is slightly limited by motion degradation and utilization of fast scanning protocol. I do not see any suspicious findings. Electronically Signed   By: Nelson Chimes M.D.   On: 05/18/2021 13:44   CT ABDOMEN PELVIS W CONTRAST  Result Date: 05/15/2021 CLINICAL DATA:  Abnormal left hilar soft tissue noted on chest CTA. This was concerning for malignancy. Additional evaluation to assess for possible metastatic disease. EXAM: CT ABDOMEN AND PELVIS WITH CONTRAST TECHNIQUE: Multidetector CT imaging of the abdomen and pelvis was performed using the  standard protocol following bolus administration of intravenous contrast. CONTRAST:  66mL OMNIPAQUE IOHEXOL 350 MG/ML SOLN COMPARISON:  Current chest CTA. FINDINGS: Lower chest: Small right effusion. Left lung base subcentimeter nodules. Small patchy areas of ground-glass opacity at the left lung base. Pericardial effusion. No change from the earlier chest CTA. Hepatobiliary: Subtle liver lesions suspicious for metastatic disease. Largest lies in the right lobe, segment 6, 1.4 cm in size. Liver normal in size and overall attenuation. Mildly distended gallbladder. Increased attenuation material in the dependent gallbladder possibly small stones or sludge. No wall thickening or inflammation. No bile duct dilation. Pancreas: Unremarkable. No pancreatic ductal dilatation or surrounding inflammatory changes. Spleen: Normal in size without focal abnormality. Adrenals/Urinary Tract: Bilateral adrenal masses, right 2.1 x 1.4 cm, left 1.1 cm size. Symmetric renal enhancement and excretion. No renal masses, stones or hydronephrosis. Normal ureters. Bladder minimally distended. Wall appears thickened. No bladder mass. Stomach/Bowel: Normal stomach. Small bowel and colon are normal in caliber. No wall thickening. No inflammation. Normal appendix suggested. No evidence of appendicitis. Vascular/Lymphatic: Mild aortic atherosclerosis.  No aneurysm. Prominent lymph nodes above the celiac axis, largest 1.2 cm in short axis. Prominent but subcentimeter retroperitoneal lymph nodes. No other enlarged nodes. Reproductive: Significant prostate enlargement elevating the bladder base. Prostate measures 6.9 cm superior to inferior by 5.8 x 5.6 cm transversely. Other: No abdominal wall hernia or abnormality. No abdominopelvic ascites. Musculoskeletal: Small area of sclerosis in the right ilium, likely a bone island. More definitive bone island noted in the anterior left femoral head. Vague sclerotic lesion along the lower endplate of T9. No  osteolytic lesions. No fracture or acute finding. IMPRESSION: 1. Subtle low-density liver lesions consistent with metastatic disease. 2. Bilateral adrenal masses suspicious for metastatic disease. 3. Prominent lymph nodes above the celiac axis suspicious for metastatic disease. 4. Subtle areas of sclerosis, right ilium and lower endplate of T9, nonspecific, possibly metastatic disease. 5. No acute findings. Electronically Signed   By: Lajean Manes M.D.   On: 05/15/2021 17:56   MR LIVER W WO CONTRAST  Result Date: 05/18/2021 CLINICAL DATA:  Inpatient. Acute respiratory failure with infiltrative left upper lobe soft tissue and bilateral mediastinal adenopathy suspicious on chest CT for lung malignancy. Indeterminate liver and bilateral adrenal lesions on CT abdomen study. EXAM: MRI ABDOMEN WITHOUT AND WITH  CONTRAST TECHNIQUE: Multiplanar multisequence MR imaging of the abdomen was performed both before and after the administration of intravenous contrast. CONTRAST:  67mL GADAVIST GADOBUTROL 1 MMOL/ML IV SOLN COMPARISON:  05/15/2021 CT abdomen/pelvis and chest CT angiogram. FINDINGS: Substantially motion degraded exam, limiting assessment. Lower chest: Small right and trace left dependent pleural effusions. Geographic consolidation in the visualized lingula and right middle lobe. Hepatobiliary: Normal liver size and configuration. No hepatic steatosis. There are several (at least 5) mildly T2 hyperintense T1 hypointense hypoenhancing liver masses scattered throughout the liver with restricted diffusion, compatible with metastatic disease, largest 1.5 x 1.3 cm in the segment 6 right liver (series 4/image 20), 1.3 x 1.1 cm in the anterior segment 4 left liver (series 4/image 18) and 1.1 x 1.1 cm in the segment 8 right liver (series 4/image 9). Normal gallbladder with no cholelithiasis. No biliary ductal dilatation. Common bile duct diameter 4 mm. No choledocholithiasis. No biliary masses, strictures or beading.  Pancreas: No pancreatic mass or duct dilation.  No pancreas divisum. Spleen: Normal size. No mass. Adrenals/Urinary Tract: Enhancing right adrenal 2.5 x 1.7 cm nodule (series 3/image 18) demonstrates no significant loss of signal intensity on chemical shift imaging and is indeterminate. Similar enhancing left adrenal 1.5 x 1.3 cm nodule (series 3/image 23), too small to characterize for intralesional lipid on the motion degraded chemical shift images. No hydronephrosis. Scattered bilateral simple renal cysts, largest 1.6 cm in the anterior upper left kidney. T2 hypointense T1 hyperintense 1.1 cm renal cortical lesion in the lower right kidney (series 4/image 33) without compelling enhancement on the motion degraded postcontrast sequences, compatible with hemorrhagic/proteinaceous Bosniak category 2 renal cyst. No overtly suspicious renal masses. Stomach/Bowel: Normal non-distended stomach. Visualized small and large bowel is normal caliber, with no bowel wall thickening. Vascular/Lymphatic: Normal caliber abdominal aorta. Patent portal, splenic, hepatic and renal veins. A few mildly enlarged left para-aortic nodes, largest 1.2 cm short axis diameter (series 18/image 66). Enlarged 1.5 cm high left retroperitoneal node anterior to the left diaphragmatic crus (series 18/image 37). Other: No abdominal ascites or focal fluid collection. Musculoskeletal: Enhancing inferior T9 vertebral 2.8 cm and right T10 vertebral 1.1 cm bone lesions (series 17/image 19). Enhancing 2.1 cm medial right iliac bone lesion (series 17/image 19). IMPRESSION: 1. Limited motion degraded scan. 2. Several (at least 5) hypoenhancing liver masses scattered throughout the liver, with MRI features compatible with metastatic disease. Percutaneous biopsy of the 1.5 cm segment 6 right liver or 1.3 cm anterior segment 4 left liver lesions could be considered. 3. Indeterminate enhancing bilateral adrenal nodules, measuring 2.5 cm on the right and 1.5 cm  on the left. Metastatic disease is not excluded. 4. Left para-aortic and high left retroperitoneal lymphadenopathy suspicious for metastatic disease. 5. Enhancing T9 and T10 vertebral and medial right iliac bone lesions, suspicious for bone metastases. 6. Small right and trace left dependent pleural effusions. Geographic consolidation in the visualized lingula and right middle lobe, nonspecific, please see recent chest CT angiogram study for details. Electronically Signed   By: Ilona Sorrel M.D.   On: 05/18/2021 16:39   NM PET Image Initial (PI) Skull Base To Thigh  Result Date: 06/14/2021 CLINICAL DATA:  Initial treatment strategy for lung nodule. Lymphadenopathy with liver lesions. EXAM: NUCLEAR MEDICINE PET SKULL BASE TO THIGH TECHNIQUE: 12.0 mCi F-18 FDG was injected intravenously. Full-ring PET imaging was performed from the skull base to thigh after the radiotracer. CT data was obtained and used for attenuation correction and anatomic localization. Fasting  blood glucose: 122 mg/dl COMPARISON:  CTA Chest 06/07/2021 FINDINGS: Mediastinal blood pool activity: SUV max 0.8 Liver activity: SUV max NA NECK: 12 mm left cervical node on 41/0 is hypermetabolic with SUV max = 4.3. Small hypermetabolic lymph nodes evident right lower neck. Bilateral supraclavicular hypermetabolic lymph nodes including 11 mm left supraclavicular node on 60/3 with SUV max = 7.4. Markedly Markedly hypermetabolic left thyroid nodule measures 1.4 cm on image 60/3 with SUV max = 21.7. Incidental CT findings: none CHEST: Hypermetabolic mediastinal lymphadenopathy evident. Index subcarinal measures 18 mm short axis on 01/02/3 with SUV max = 7.0 hypermetabolic lymph nodes are seen in both hilar regions into the prevascular space. 16 mm prevascular node on 87/3 demonstrates SUV max = 7.0. Marked wall thickening noted in the mid esophagus, at approximately the level of the carina. Although somewhat difficult to assess given the adjacent  hypermetabolic lymphadenopathy, there does appear to be marked hypermetabolism in the wall of this abnormal appearing esophagus with SUV max = 7.7. Areas of collapse/consolidation in the left upper lobe, right middle lobe and infrahilar left lower lobe show substantial hypermetabolism. There is diffuse interstitial opacity in both lungs with innumerable tiny bilateral pulmonary nodules. Incidental CT findings: Trace pericardial effusion. Right Port-A-Cath tip is positioned in the distal SVC. Coronary artery calcification is evident. Mild atherosclerotic calcification is noted in the wall of the thoracic aorta. ABDOMEN/PELVIS: Multiple hypermetabolic liver lesions evident. 18 mm subcapsular lesion anterior left liver on 150/3 demonstrates SUV max = 7.7. Hypermetabolic gastrohepatic ligament lymph nodes measure up to 14 mm on 150/3 with SUV max = 6.6 12 mm left para-aortic node on 301/3 is hypermetabolic with SUV max = 4.4. 2.4 cm right adrenal nodule shows low attenuation, suggesting adenoma with associated hypermetabolism SUV max = 5.3. Incidental CT findings: Prostate gland is enlarged. Scattered diverticuli noted left colon without diverticulitis. There is mild atherosclerotic calcification of the abdominal aorta without aneurysm. SKELETON: Scattered hypermetabolic bone metastases evident. 12 mm posterior right iliac sclerotic lesion on 02/20 8/3 shows SUV max = 5.6. Sacral metastases demonstrate SUV max = 5.3 subtle 13 mm left femoral neck lesion demonstrates SUV max = 5.3. Incidental CT findings: none IMPRESSION: 1. Similar right middle and left upper lobe collapse with associated marked hypermetabolism. There is diffuse interstitial and micro nodularity in both lungs consistent with metastatic disease. 2. Hypermetabolic metastatic lymphadenopathy in the neck, mediastinum, hilar regions, gastrohepatic ligament, and retroperitoneum. 3. Marked hypermetabolism identified in the mid esophagus with associated  esophageal wall thickening. This may be related to secondary involvement although primary esophageal neoplasm not excluded. 4. Hypermetabolic liver and bone metastases. 5. Hypermetabolic right adrenal nodule. This is felt to be likely an adenoma although metastatic involvement is certainly possible. 6. Hypermetabolic left thyroid nodule. Recommend thyroid US and biopsy. (Ref: J Am Coll Radiol. 2015 Feb;12(2): 143-50). 7.  Aortic Atherosclerois (ICD10-170.0) Electronically Signed   By: Misty Stanley M.D.   On: 06/14/2021 12:27   DG Chest Port 1 View  Result Date: 06/13/2021 CLINICAL DATA:  Shortness of breath.  Recent pneumonia. EXAM: PORTABLE CHEST 1 VIEW COMPARISON:  Radiograph and chest CT 06/07/2021. FINDINGS: Right chest port remains in place. Similar volume loss in the left hemithorax with diffusely increased opacity, opacity throughout the left upper lobe with seen on recent CT. Opacity at the right lung base corresponds to right middle lobe collapse. Background bronchial thickening. Trace pleural effusions on CT are not well demonstrated by radiograph. No pneumothorax or significant change from prior  imaging. Heart size is grossly stable. IMPRESSION: 1. No significant change from prior imaging. 2. Volume loss in the left hemithorax with diffusely increased opacity throughout the left upper lung zone, upper lobe consolidation seen on recent CT. 3. Right lung base opacity corresponds to right middle lobe collapse. 4. Background bronchial thickening. Electronically Signed   By: Keith Rake M.D.   On: 06/13/2021 18:36   DG Chest Portable 1 View  Result Date: 06/07/2021 CLINICAL DATA:  Cough, shortness of breath. EXAM: PORTABLE CHEST 1 VIEW COMPARISON:  May 14, 2021. FINDINGS: Stable cardiomediastinal silhouette. Interval placement of right internal jugular Port-A-Cath with distal tip in expected position of the SVC. No pneumothorax is noted. Left upper lobe consolidation is noted concerning for  pneumonia or atelectasis. Probable central pulmonary vascular congestion is noted. Bony thorax is unremarkable. IMPRESSION: Left upper lobe opacity is noted concerning for pneumonia or atelectasis. Electronically Signed   By: Marijo Conception M.D.   On: 06/07/2021 12:13   ECHOCARDIOGRAM COMPLETE  Result Date: 06/08/2021    ECHOCARDIOGRAM REPORT   Patient Name:   PAGE LANCON Date of Exam: 06/08/2021 Medical Rec #:  [id number]      Height:       69.0 in Accession #:    [id number]     Weight:       220.7 lb Date of Birth:  20-Dec-1955      BSA:          2.154 m Patient Age:    83 years       BP:           130/81 mmHg Patient Gender: M              HR:           108 bpm. Exam Location:  [facility] Procedure: 2D Echo, Cardiac Doppler, Color Doppler and Strain Analysis Indications:    CHF-acute diastolic F41.42  History:        Patient has no prior history of Echocardiogram examinations.                 COPD.  Sonographer:    Sherrie Sport Referring Phys: [id number] Ivor Costa Diagnosing      Kate Sable MD Phys:  Sonographer Comments: Suboptimal apical window. Global longitudinal strain was attempted. IMPRESSIONS  1. Left ventricular ejection fraction, by estimation, is 65 to 70%. The left ventricle has normal function. The left ventricle has no regional wall motion abnormalities. Left ventricular diastolic parameters are consistent with Grade I diastolic dysfunction (impaired relaxation).  2. Right ventricular systolic function is normal. The right ventricular size is mildly enlarged.  3. A small pericardial effusion is present.  4. The mitral valve is normal in structure. No evidence of mitral valve regurgitation.  5. The aortic valve is tricuspid. Aortic valve regurgitation is not visualized. FINDINGS  Left Ventricle: Left ventricular ejection fraction, by estimation, is 65 to 70%. The left ventricle has normal function. The left ventricle has no regional wall motion abnormalities. Global longitudinal strain performed but  not reported based on interpreter judgement due to suboptimal tracking. 3D left ventricular ejection fraction analysis performed but not reported based on interpreter judgement due to suboptimal quality. The left ventricular internal cavity size was normal in size. There is no left ventricular hypertrophy. Left ventricular diastolic parameters are consistent with Grade I diastolic dysfunction (impaired relaxation). Right Ventricle: The right ventricular size is mildly enlarged. No increase in right ventricular wall thickness. Right ventricular  systolic function is normal. Left Atrium: Left atrial size was normal in size. Right Atrium: Right atrial size was normal in size. Pericardium: A small pericardial effusion is present. Mitral Valve: The mitral valve is normal in structure. No evidence of mitral valve regurgitation. Tricuspid Valve: The tricuspid valve is grossly normal. Tricuspid valve regurgitation is trivial. Aortic Valve: The aortic valve is tricuspid. Aortic valve regurgitation is not visualized. Aortic valve mean gradient measures 6.0 mmHg. Aortic valve peak gradient measures 10.5 mmHg. Aortic valve area, by VTI measures 2.89 cm. Pulmonic Valve: The pulmonic valve was not well visualized. Pulmonic valve regurgitation is not visualized. Aorta: The aortic root is normal in size and structure. IAS/Shunts: No atrial level shunt detected by color flow Doppler.  LEFT VENTRICLE PLAX 2D LVIDd:         4.80 cm  Diastology LVIDs:         2.60 cm  LV e' medial:   1.96 cm/s LV PW:         1.30 cm  LV E/e' medial: 38.7 LV IVS:        1.20 cm LVOT diam:     2.10 cm LV SV:         76 LV SV Index:   35 LVOT Area:     3.46 cm 3D Volume EF:                         3D EF:        52 %                         LV EDV:       96 ml                         LV ESV:       46 ml                         LV SV:        50 ml RIGHT VENTRICLE RV S prime:     14.00 cm/s TAPSE (M-mode): 5.7 cm LEFT ATRIUM             Index       RIGHT  ATRIUM           Index LA diam:        3.30 cm 1.53 cm/m  RA Area:     21.00 cm LA Vol (A2C):   64.2 ml 29.80 ml/m RA Volume:   55.60 ml  25.81 ml/m LA Vol (A4C):   31.2 ml 14.48 ml/m LA Biplane Vol: 45.4 ml 21.07 ml/m  AORTIC VALVE                    PULMONIC VALVE AV Area (Vmax):    2.39 cm     PV Vmax:       0.73 m/s AV Area (Vmean):   2.78 cm     PV Peak grad:  2.1 mmHg AV Area (VTI):     2.89 cm AV Vmax:           162.00 cm/s AV Vmean:          108.000 cm/s AV VTI:            0.261 m AV Peak Grad:      10.5 mmHg AV Mean Grad:  6.0 mmHg LVOT Vmax:         112.00 cm/s LVOT Vmean:        86.800 cm/s LVOT VTI:          0.218 m LVOT/AV VTI ratio: 0.84  AORTA Ao Root diam: 3.30 cm MITRAL VALVE                TRICUSPID VALVE MV Area (PHT): 3.95 cm     TR Peak grad:   30.0 mmHg MV Decel Time: 192 msec     TR Vmax:        274.00 cm/s MV E velocity: 75.80 cm/s MV A velocity: 119.00 cm/s  SHUNTS MV E/A ratio:  0.64         Systemic VTI:  0.22 m                             Systemic Diam: 2.10 cm Kate Sable MD Electronically signed by Kate Sable MD Signature Date/Time: 06/08/2021/3:10:53 PM    Final    [place] CORE BIOPSY (LYMPH NODES)  Result Date: 05/20/2021 INDICATION: Possible lung carcinoma by prior imaging with vague liver lesions and also enlarged left supraclavicular lymph node by CT. Due to oxygen requirement and poor respiratory status, biopsy of the left supraclavicular lymph node is performed without sedation as tolerance a liver biopsy currently with sedation would be borderline and potentially high risk. EXAM: ULTRASOUND GUIDED CORE BIOPSY OF LEFT SUPRACLAVICULAR LYMPH NODE MEDICATIONS: None. ANESTHESIA/SEDATION: None PROCEDURE: The procedure, risks, benefits, and alternatives were explained to the patient. Questions regarding the procedure were encouraged and answered. The patient understands and consents to the procedure. A time-out was performed prior to initiating the procedure.  The left neck was prepped with chlorhexidine in a sterile fashion, and a sterile drape was applied covering the operative field. A sterile gown and sterile gloves were used for the procedure. Local anesthesia was provided with 1% Lidocaine. After localizing an enlarged left supraclavicular lymph node, an 18 gauge core biopsy device was utilized in obtaining 4 separate core biopsy samples. Material was submitted in formalin. Additional ultrasound was performed. COMPLICATIONS: None immediate. FINDINGS: Several left supraclavicular and lower cervical lymph nodes are identified. The largest measures approximately 1.7 x 1.3 x 1.4 cm by ultrasound. Solid core biopsy samples were obtained from this lymph node. IMPRESSION: Ultrasound-guided core biopsy performed enlarged left supraclavicular lymph node. Electronically Signed   By: Aletta Edouard M.D.   On: 05/20/2021 11:02   IR IMAGING GUIDED PORT INSERTION  Result Date: 06/03/2021 INDICATION: Metastatic lung cancer.  Chemotherapy. EXAM: IMPLANTED PORT A CATH PLACEMENT WITH ULTRASOUND AND FLUOROSCOPIC GUIDANCE MEDICATIONS: None ANESTHESIA/SEDATION: Moderate (conscious) sedation was employed during this procedure. A total of Versed 0 mg and Fentanyl 25 mcg was administered intravenously. Moderate Sedation Time: 20 minutes. The patient's level of consciousness and vital signs were monitored continuously by radiology nursing throughout the procedure under my direct supervision. FLUOROSCOPY TIME:  0 minutes, 24 seconds (4.5 mGy) COMPLICATIONS: None immediate. PROCEDURE: The procedure, risks, benefits, and alternatives were explained to the patient. Questions regarding the procedure were encouraged and answered. The patient understands and consents to the procedure. The neck and chest were prepped with chlorhexidine in a sterile fashion, and a sterile drape was applied covering the operative field. Maximum barrier sterile technique with sterile gowns and gloves were used for  the procedure. A timeout was performed prior to the initiation of the procedure. Local anesthesia  was provided with 1% lidocaine with epinephrine. After creating a small venotomy incision, a micropuncture kit was utilized to access the internal jugular vein under direct, real-time ultrasound guidance. Ultrasound image documentation was performed. The microwire was kinked to measure appropriate catheter length. A subcutaneous port pocket was then created along the upper chest wall utilizing a combination of sharp and blunt dissection. The pocket was irrigated with sterile saline. A single lumen ISP power injectable port was chosen for placement. The 8 Fr catheter was tunneled from the port pocket site to the venotomy incision. The port was placed in the pocket. The external catheter was trimmed to appropriate length. At the venotomy, an 8 Fr peel-away sheath was placed over a guidewire under fluoroscopic guidance. The catheter was then placed through the sheath and the sheath was removed. Final catheter positioning was confirmed and documented with a fluoroscopic spot radiograph. The port was accessed with a Huber needle, aspirated and flushed with heparinized saline. The port pocket incision was closed with interrupted 3-0 Vicryl suture then Dermabond was applied, including at the venotomy incision. Dressings were placed. The patient tolerated the procedure well without immediate post procedural complication. IMPRESSION: Successful placement of a right internal jugular approach power injectable Port-A-Cath. The catheter is ready for immediate use. Michaelle Birks, MD [facility] Rad[facility] Electronically Signed   By: Michaelle Birks M.D.   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  Initial plan was to start chemotherapy with carboplatinum, Taxol, and Avastin later this week.  Depending on patient's improvement, will keep plan to initiate treatment on Thursday.  Appreciate palliative care input. Shortness of breath/COPD exacerbation: Patient requires approximately 4 L nasal cannula at baseline.  Continue steroids and current antibiotics.   Leukocytosis: Mild.  Appreciate consult, will follow.  Cancer Staging Adenocarcinoma of left lung [facility]) Staging form: Lung, AJCC 8th Edition - Clinical stage from 05/27/2021: Stage IVB (cTX, cN3, pM1c) - Signed by Lloyd Huger, MD on 05/27/2021   Lloyd Huger, MD   06/14/2021 4:37 PM

## 2021-06-15 DIAGNOSIS — C3490 Malignant neoplasm of unspecified part of unspecified bronchus or lung: Secondary | ICD-10-CM

## 2021-06-15 LAB — CBC
HCT: 44.9 % (ref 39.0–52.0)
Hemoglobin: 14.8 g/dL (ref 13.0–17.0)
MCH: 27.4 pg (ref 26.0–34.0)
MCHC: 33 g/dL (ref 30.0–36.0)
MCV: 83.1 fL (ref 80.0–100.0)
Platelets: 232 10*3/uL (ref 150–400)
RBC: 5.4 MIL/uL (ref 4.22–5.81)
RDW: 12.6 % (ref 11.5–15.5)
WBC: 16.2 10*3/uL — ABNORMAL HIGH (ref 4.0–10.5)
nRBC: 0 % (ref 0.0–0.2)

## 2021-06-15 LAB — BASIC METABOLIC PANEL
Anion gap: 10 (ref 5–15)
BUN: 25 mg/dL — ABNORMAL HIGH (ref 8–23)
CO2: 35 mmol/L — ABNORMAL HIGH (ref 22–32)
Calcium: 9.3 mg/dL (ref 8.9–10.3)
Chloride: 91 mmol/L — ABNORMAL LOW (ref 98–111)
Creatinine, Ser: 0.66 mg/dL (ref 0.61–1.24)
GFR, Estimated: >60 mL/min (ref >60)
Glucose, Bld: 127 mg/dL — ABNORMAL HIGH (ref 70–99)
Potassium: 3.9 mmol/L (ref 3.5–5.1)
Sodium: 136 mmol/L (ref 135–145)

## 2021-06-15 MED ORDER — CHLORHEXIDINE GLUCONATE CLOTH 2 % EX PADS
6.0000 | MEDICATED_PAD | Freq: Every day | CUTANEOUS | Status: DC
  Administered 2021-06-17 – 2021-06-25 (×8): 6 via TOPICAL

## 2021-06-15 NOTE — Progress Notes (Addendum)
PROGRESS NOTE    Derek Clarke  IRW:[id number] DOB: 08-Mar-1956 DOA: 06/13/2021 PCP: Pcp, No   Assessment & Plan:   Principal Problem:   Acute on chronic respiratory failure with hypoxia (HCC) Active Problems:   COPD with acute exacerbation (HCC)   SOB (shortness of breath)   Elevated troponin   Dysphagia   GAD (generalized anxiety disorder)  Acute on chronic hypoxic respiratory failure: likely multifactorial including COPD exacerbation, lung cancer & possible postobstructive pneumonia. Continue on supplemental and wean back to baseline as tolerated. On 4L [place] chronically at home. Continue on IV levaquin, bronchodilators, steroids and encourage incentive spirometry   Possible postobstructive pneumonia: continue on IV levaquin, steroids, & bronchodilators   Chronic diastolic CHF: appears compensated currently. Will monitor I/Os.   Adenocarcinoma of the lung: stage IV. Will start chemo tomorrow as per onco   Dysphagia: likely secondary to lung cancer. Continue on PPI. Continue on dysphagia as per speech but pt is not happy with this diet & may assume the risk of aspiration pneumonia have a heart healthy diet. Will go for [facility] tomorrow if pt agrees as per speech.    Generalized anxiety disorder: severity unknown. Ativan prn   Obesity: BMI 32.5. Complicates overall care & prognosis   DVT prophylaxis: lovenox  Code Status: full  Family Communication:  Disposition Plan: depends on PT/OT recs (not consulted yet)   Level of care: Progressive Cardiac  Status is: Inpatient  Remains inpatient appropriate because:Ongoing diagnostic testing needed not appropriate for outpatient work up, IV treatments appropriate due to intensity of illness or inability to take PO, and Inpatient level of care appropriate due to severity of illness  Dispo:  Patient From: Home  Planned Disposition: Home  Medically stable for discharge: No       Consultants:  onco  Procedures:   Antimicrobials:  levaquin    Subjective: Pt c/o difficulty swallowing intermittently   Objective: Vitals:   06/14/21 0545 06/14/21 1958 06/15/21 0246 06/15/21 0638  BP: 111/79 122/67 122/76 119/82  Pulse: (!) 101 (!) 108 86 84  Resp: 20 (!) 27 20 17   Temp: 98.2 F (36.8 C) 98.6 F (37 C) 98.4 F (36.9 C) 98.6 F (37 C)  TempSrc: Oral Oral Oral Oral  SpO2: 94% 94% 100% 99%  Weight:      Height:        Intake/Output Summary (Last 24 hours) at 06/15/2021 0834 Last data filed at 06/14/2021 1239 Gross per 24 hour  Intake --  Output 750 ml  Net -750 ml   Filed Weights   06/13/21 1733  Weight: 100 kg    Examination:  General exam: Appears calm and comfortable  Respiratory system: diminished breath sounds b/l Cardiovascular system: S1 & S2 +. No rubs, gallops or clicks Gastrointestinal system: Abdomen is nondistended, soft and nontender. Normal bowel sounds heard. Central nervous system: Alert and oriented. Moves all extremities  Psychiatry: Judgement and insight appear normal. Flat mood and affect     Data Reviewed: I have personally reviewed following labs and imaging studies  CBC: Recent Labs  Lab 06/13/21 1750 06/14/21 0420 06/15/21 0434  WBC 15.9* 10.8* 16.2*  NEUTROABS  --  10.2*  --   HGB 15.4 14.7 14.8  HCT 45.3 43.6 44.9  MCV 84.7 81.8 83.1  PLT 266 268 761   Basic Metabolic Panel: Recent Labs  Lab 06/13/21 1750 06/13/21 2347 06/14/21 0420 06/15/21 0434  NA 135  --  134* 136  K 4.1  --  3.9 3.9  CL 92*  --  92* 91*  CO2 31  --  31 35*  GLUCOSE 106*  --  145* 127*  BUN 19  --  21 25*  CREATININE 0.81  --  0.65 0.66  CALCIUM 9.5  --  9.4 9.3  MG  --  2.2 2.2  --   PHOS  --   --  4.4  --    GFR: Estimated Creatinine Clearance: 108.7 mL/min (by C-G formula based on SCr of 0.66 mg/dL). Liver Function Tests: Recent Labs  Lab 06/14/21 0420  AST 23  ALT 26  ALKPHOS 111  BILITOT 0.9  PROT 6.7  ALBUMIN 3.3*   No results for input(s): LIPASE, AMYLASE in  the last 168 hours. No results for input(s): AMMONIA in the last 168 hours. Coagulation Profile: No results for input(s): INR, PROTIME in the last 168 hours. Cardiac Enzymes: No results for input(s): CKTOTAL, CKMB, CKMBINDEX, TROPONINI in the last 168 hours. BNP (last 3 results) No results for input(s): PROBNP in the last 8760 hours. HbA1C: No results for input(s): HGBA1C in the last 72 hours. CBG: Recent Labs  Lab 06/13/21 1347  GLUCAP 122*   Lipid Profile: No results for input(s): CHOL, HDL, LDLCALC, TRIG, CHOLHDL, LDLDIRECT in the last 72 hours. Thyroid Function Tests: No results for input(s): TSH, T4TOTAL, FREET4, T3FREE, THYROIDAB in the last 72 hours. Anemia Panel: No results for input(s): VITAMINB12, FOLATE, FERRITIN, TIBC, IRON, RETICCTPCT in the last 72 hours. Sepsis Labs: Recent Labs  Lab 06/13/21 1750 06/13/21 2347  PROCALCITON  --  <0.10  LATICACIDVEN 1.7 2.2*    Recent Results (from the past 240 hour(s))  Resp Panel by RT-PCR (Flu A&B, Covid) Nasopharyngeal Swab     Status: None   Collection Time: 06/07/21 11:21 AM   Specimen: Nasopharyngeal Swab; Nasopharyngeal(NP) swabs in vial transport medium  Result Value Ref Range Status   SARS Coronavirus 2 by RT PCR NEGATIVE NEGATIVE Final    Comment: (NOTE) SARS-CoV-2 target nucleic acids are NOT DETECTED.  The SARS-CoV-2 RNA is generally detectable in upper respiratory specimens during the acute phase of infection. The lowest concentration of SARS-CoV-2 viral copies this assay can detect is 138 copies/mL. A negative result does not preclude SARS-Cov-2 infection and should not be used as the sole basis for treatment or other patient management decisions. A negative result may occur with  improper specimen collection/handling, submission of specimen other than nasopharyngeal swab, presence of viral mutation(s) within the areas targeted by this assay, and inadequate number of viral copies(<138 copies/mL). A  negative result must be combined with clinical observations, patient history, and epidemiological information. The expected result is Negative.  Fact Sheet for Patients:  EntrepreneurPulse.com.au  Fact Sheet for Healthcare Providers:  IncredibleEmployment.be  This test is no t yet approved or cleared by the [place] FDA and  has been authorized for detection and/or diagnosis of SARS-CoV-2 by FDA under an Emergency Use Authorization (EUA). This EUA will remain  in effect (meaning this test can be used) for the duration of the COVID-19 declaration under Section 564(b)(1) of the Act, 21 U.S.C.section 360bbb-3(b)(1), unless the authorization is terminated  or revoked sooner.       Influenza A by PCR NEGATIVE NEGATIVE Final   Influenza B by PCR NEGATIVE NEGATIVE Final    Comment: (NOTE) The Xpert Xpress SARS-CoV-2/FLU/RSV plus assay is intended as an aid in the diagnosis of influenza from Nasopharyngeal swab specimens and should not be used as a  sole basis for treatment. Nasal washings and aspirates are unacceptable for Xpert Xpress SARS-CoV-2/FLU/RSV testing.  Fact Sheet for Patients: EntrepreneurPulse.com.au  Fact Sheet for Healthcare Providers: IncredibleEmployment.be  This test is not yet approved or cleared by the [place] FDA and has been authorized for detection and/or diagnosis of SARS-CoV-2 by FDA under an Emergency Use Authorization (EUA). This EUA will remain in effect (meaning this test can be used) for the duration of the COVID-19 declaration under Section 564(b)(1) of the Act, 21 U.S.C. section 360bbb-3(b)(1), unless the authorization is terminated or revoked.  Performed at [facility], [address] [place], [place] [address]   Blood culture (routine x 2)     Status: None   Collection Time: 06/07/21  1:01 PM   Specimen: BLOOD  Result Value Ref Range Status    Specimen Description BLOOD LEFT ANTECUBITAL  Final   Special Requests   Final    BOTTLES DRAWN AEROBIC AND ANAEROBIC Blood Culture results may not be optimal due to an inadequate volume of blood received in culture bottles   Culture   Final    NO GROWTH 5 DAYS Performed at [facility], [address] [place], [place] [address]    Report Status 06/12/2021 FINAL  Final  Blood culture (routine x 2)     Status: None   Collection Time: 06/07/21  1:21 PM   Specimen: BLOOD  Result Value Ref Range Status   Specimen Description BLOOD BLOOD LEFT HAND  Final   Special Requests   Final    BOTTLES DRAWN AEROBIC ONLY Blood Culture results may not be optimal due to an inadequate volume of blood received in culture bottles   Culture   Final    NO GROWTH 5 DAYS Performed at [facility], [address] [place], [place] [address]    Report Status 06/12/2021 FINAL  Final  Resp Panel by RT-PCR (Flu A&B, Covid) Nasopharyngeal Swab     Status: None   Collection Time: 06/13/21  8:27 PM   Specimen: Nasopharyngeal Swab; Nasopharyngeal(NP) swabs in vial transport medium  Result Value Ref Range Status   SARS Coronavirus 2 by RT PCR NEGATIVE NEGATIVE Final    Comment: (NOTE) SARS-CoV-2 target nucleic acids are NOT DETECTED.  The SARS-CoV-2 RNA is generally detectable in upper respiratory specimens during the acute phase of infection. The lowest concentration of SARS-CoV-2 viral copies this assay can detect is 138 copies/mL. A negative result does not preclude SARS-Cov-2 infection and should not be used as the sole basis for treatment or other patient management decisions. A negative result may occur with  improper specimen collection/handling, submission of specimen other than nasopharyngeal swab, presence of viral mutation(s) within the areas targeted by this assay, and inadequate number of viral copies(<138 copies/mL). A negative result must be combined with clinical observations,  patient history, and epidemiological information. The expected result is Negative.  Fact Sheet for Patients:  EntrepreneurPulse.com.au  Fact Sheet for Healthcare Providers:  IncredibleEmployment.be  This test is no t yet approved or cleared by the [place] FDA and  has been authorized for detection and/or diagnosis of SARS-CoV-2 by FDA under an Emergency Use Authorization (EUA). This EUA will remain  in effect (meaning this test can be used) for the duration of the COVID-19 declaration under Section 564(b)(1) of the Act, 21 U.S.C.section 360bbb-3(b)(1), unless the authorization is terminated  or revoked sooner.       Influenza A by PCR NEGATIVE NEGATIVE Final   Influenza  B by PCR NEGATIVE NEGATIVE Final    Comment: (NOTE) The Xpert Xpress SARS-CoV-2/FLU/RSV plus assay is intended as an aid in the diagnosis of influenza from Nasopharyngeal swab specimens and should not be used as a sole basis for treatment. Nasal washings and aspirates are unacceptable for Xpert Xpress SARS-CoV-2/FLU/RSV testing.  Fact Sheet for Patients: EntrepreneurPulse.com.au  Fact Sheet for Healthcare Providers: IncredibleEmployment.be  This test is not yet approved or cleared by the [place] FDA and has been authorized for detection and/or diagnosis of SARS-CoV-2 by FDA under an Emergency Use Authorization (EUA). This EUA will remain in effect (meaning this test can be used) for the duration of the COVID-19 declaration under Section 564(b)(1) of the Act, 21 U.S.C. section 360bbb-3(b)(1), unless the authorization is terminated or revoked.  Performed at [facility], [address], [place], [place] [address]          Radiology Studies: NM PET Image Initial (PI) Skull Base To Thigh  Result Date: 06/14/2021 CLINICAL DATA:  Initial treatment strategy for lung nodule. Lymphadenopathy with liver lesions.  EXAM: NUCLEAR MEDICINE PET SKULL BASE TO THIGH TECHNIQUE: 12.0 mCi F-18 FDG was injected intravenously. Full-ring PET imaging was performed from the skull base to thigh after the radiotracer. CT data was obtained and used for attenuation correction and anatomic localization. Fasting blood glucose: 122 mg/dl COMPARISON:  CTA Chest 06/07/2021 FINDINGS: Mediastinal blood pool activity: SUV max 0.8 Liver activity: SUV max NA NECK: 12 mm left cervical node on 70/6 is hypermetabolic with SUV max = 4.3. Small hypermetabolic lymph nodes evident right lower neck. Bilateral supraclavicular hypermetabolic lymph nodes including 11 mm left supraclavicular node on 60/3 with SUV max = 7.4. Markedly Markedly hypermetabolic left thyroid nodule measures 1.4 cm on image 60/3 with SUV max = 21.7. Incidental CT findings: none CHEST: Hypermetabolic mediastinal lymphadenopathy evident. Index subcarinal measures 18 mm short axis on 01/02/3 with SUV max = 7.0 hypermetabolic lymph nodes are seen in both hilar regions into the prevascular space. 16 mm prevascular node on 87/3 demonstrates SUV max = 7.0. Marked wall thickening noted in the mid esophagus, at approximately the level of the carina. Although somewhat difficult to assess given the adjacent hypermetabolic lymphadenopathy, there does appear to be marked hypermetabolism in the wall of this abnormal appearing esophagus with SUV max = 7.7. Areas of collapse/consolidation in the left upper lobe, right middle lobe and infrahilar left lower lobe show substantial hypermetabolism. There is diffuse interstitial opacity in both lungs with innumerable tiny bilateral pulmonary nodules. Incidental CT findings: Trace pericardial effusion. Right Port-A-Cath tip is positioned in the distal SVC. Coronary artery calcification is evident. Mild atherosclerotic calcification is noted in the wall of the thoracic aorta. ABDOMEN/PELVIS: Multiple hypermetabolic liver lesions evident. 18 mm subcapsular lesion  anterior left liver on 150/3 demonstrates SUV max = 7.7. Hypermetabolic gastrohepatic ligament lymph nodes measure up to 14 mm on 150/3 with SUV max = 6.6 12 mm left para-aortic node on 237/6 is hypermetabolic with SUV max = 4.4. 2.4 cm right adrenal nodule shows low attenuation, suggesting adenoma with associated hypermetabolism SUV max = 5.3. Incidental CT findings: Prostate gland is enlarged. Scattered diverticuli noted left colon without diverticulitis. There is mild atherosclerotic calcification of the abdominal aorta without aneurysm. SKELETON: Scattered hypermetabolic bone metastases evident. 12 mm posterior right iliac sclerotic lesion on 02/20 8/3 shows SUV max = 5.6. Sacral metastases demonstrate SUV max = 5.3 subtle 13 mm left femoral neck lesion demonstrates SUV max = 5.3. Incidental  CT findings: none IMPRESSION: 1. Similar right middle and left upper lobe collapse with associated marked hypermetabolism. There is diffuse interstitial and micro nodularity in both lungs consistent with metastatic disease. 2. Hypermetabolic metastatic lymphadenopathy in the neck, mediastinum, hilar regions, gastrohepatic ligament, and retroperitoneum. 3. Marked hypermetabolism identified in the mid esophagus with associated esophageal wall thickening. This may be related to secondary involvement although primary esophageal neoplasm not excluded. 4. Hypermetabolic liver and bone metastases. 5. Hypermetabolic right adrenal nodule. This is felt to be likely an adenoma although metastatic involvement is certainly possible. 6. Hypermetabolic left thyroid nodule. Recommend thyroid US and biopsy. (Ref: J Am Coll Radiol. 2015 Feb;12(2): 143-50). 7.  Aortic Atherosclerois (ICD10-170.0) Electronically Signed   By: Misty Stanley M.D.   On: 06/14/2021 12:27   DG Chest Port 1 View  Result Date: 06/14/2021 CLINICAL DATA:  Dyspnea EXAM: PORTABLE CHEST 1 VIEW COMPARISON:  06/13/2021 FINDINGS: Cardiac shadow is stable. Right chest wall  port is again seen and stable. Volume loss in the left hemithorax is noted with diffuse increased airspace opacity stable from the previous day. Mild right basilar opacity is noted stable in appearance as well. No new focal abnormality is noted. IMPRESSION: Stable bilateral airspace opacities similar to that seen on recent CT and plain film examination. Electronically Signed   By: Inez Catalina M.D.   On: 06/14/2021 19:39   DG Chest Port 1 View  Result Date: 06/13/2021 CLINICAL DATA:  Shortness of breath.  Recent pneumonia. EXAM: PORTABLE CHEST 1 VIEW COMPARISON:  Radiograph and chest CT 06/07/2021. FINDINGS: Right chest port remains in place. Similar volume loss in the left hemithorax with diffusely increased opacity, opacity throughout the left upper lobe with seen on recent CT. Opacity at the right lung base corresponds to right middle lobe collapse. Background bronchial thickening. Trace pleural effusions on CT are not well demonstrated by radiograph. No pneumothorax or significant change from prior imaging. Heart size is grossly stable. IMPRESSION: 1. No significant change from prior imaging. 2. Volume loss in the left hemithorax with diffusely increased opacity throughout the left upper lung zone, upper lobe consolidation seen on recent CT. 3. Right lung base opacity corresponds to right middle lobe collapse. 4. Background bronchial thickening. Electronically Signed   By: Keith Rake M.D.   On: 06/13/2021 18:36        Scheduled Meds:  acetylcysteine  3 mL Nebulization Once   aspirin EC  325 mg Oral Once   enoxaparin (LOVENOX) injection  40 mg Subcutaneous Q24H   feeding supplement (NEPRO CARB STEADY)  237 mL Oral TID BM   guaiFENesin  600 mg Oral BID   ipratropium-albuterol  3 mL Nebulization Q6H   methylPREDNISolone (SOLU-MEDROL) injection  80 mg Intravenous Q12H   Continuous Infusions:  levofloxacin (LEVAQUIN) IV Stopped (06/14/21 2358)     LOS: 2 days    Time spent: 33 mins      Wyvonnia Dusky, MD [facility] Pager [phone]  If 7PM-7AM, please contact night-coverage 06/15/2021, 8:34 AM

## 2021-06-15 NOTE — Op Note (Signed)
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  Proc: Flexible laryngoscopy  Surg:  Elon Alas Riaz Onorato  Anes:  GOT  EBL: None  Comp: None  Findings: No mobility of the left vocal cord.  No lesions noted  Procedure: The patient seen in the emergency department.  He is sitting in a chair.  The nose is sprayed with 4% Xylocaine mixed with Afrin for vasoconstriction and numbing.  A flexible scope is then readied and passed through his right nostril since this is more open than his left.  It passes easily through the nose without difficulty and his nasopharynx is clear as well.  The hypopharynx shows a normal tongue base.  His vallecula and piriform sinuses are clear.  His epiglottis is normal size and shape and elevates well.  His right vocal cord moves well to the midline and opens widely but his left vocal cord does not move at all.  It is sitting in the paramedian position so he is got some opening and air leak when he tries to vocalize.  I do not see any lesions of the larynx or of the subglottic area.  He seems to be handling his secretions well as there is no secretions lying anywhere.  The scope was then removed.  The patient tolerated the procedure well.  There were no operative complications.  Dispo:   No postop care necessary  Plan: Explained to patient the findings.  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  We discussed augmentation of the left cord in the future if there is no evidence of return of function.  He has had a swallowing study and will remain on a limited diet with thickened liquids and pured foods  Huey Romans  06/15/2021 9:26 AM

## 2021-06-15 NOTE — Progress Notes (Signed)
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  INTERVAL HISTORY: Patient with improved breathing and cough. No fevers, chronic weakness and fatigue.  REVIEW OF SYSTEMS:   Review of Systems  Constitutional:  Positive for malaise/fatigue. Negative for fever and weight loss.  HENT:  Negative for sore throat.   Respiratory:  Positive for cough and shortness of breath. Negative for hemoptysis and stridor.   Cardiovascular: Negative.  Negative for chest pain and leg swelling.  Gastrointestinal: Negative.  Negative for abdominal pain and nausea.  Genitourinary: Negative.  Negative for dysuria.  Musculoskeletal: Negative.  Negative for back pain.  Skin: Negative.  Negative for rash.  Neurological:  Positive for weakness. Negative for dizziness, focal weakness and headaches.  Psychiatric/Behavioral: Negative.  The patient is not nervous/anxious.    As per HPI. Otherwise, a complete review of systems is negative.  PAST MEDICAL HISTORY: Past Medical History:  Diagnosis Date   Adenocarcinoma ([place])    lung; stage 4 with mets to liver   Chronic respiratory failure with hypoxia (HCC)    baseline 4 L [place]   COPD (chronic obstructive pulmonary disease) (HCC)    GAD (generalized anxiety disorder)     PAST SURGICAL HISTORY: Past Surgical History:  Procedure Laterality Date   IR IMAGING GUIDED PORT INSERTION  06/03/2021    FAMILY HISTORY: Family History  Problem Relation Age of Onset   Lung disease Mother    Dementia Father     ADVANCED DIRECTIVES (Y/N):  '@ADVDIR' @  HEALTH MAINTENANCE: Social History   Tobacco Use   Smoking status: Former    Packs/day: 1.00    Years: 30.00    Pack years: 30.00    Types: Cigarettes     Quit date: 03/26/2021    Years since quitting: 0.2   Smokeless tobacco: Never  Substance Use Topics   Alcohol use: Yes   Drug use: Yes    Types: Marijuana    Comment: quit approx. 5 years ago     Colonoscopy:  PAP:  Bone density:  Lipid panel:  No Known Allergies  Current Facility-Administered Medications  Medication Dose Route Frequency Provider Last Rate Last Admin   acetaminophen (TYLENOL) tablet 650 mg  650 mg Oral Q6H PRN Howerter, Justin B, DO       Or   acetaminophen (TYLENOL) suppository 650 mg  650 mg Rectal Q6H PRN Howerter, Justin B, DO       acetylcysteine (MUCOMYST) 20 % nebulizer / oral solution 3 mL  3 mL Nebulization Once Bonnielee Haff, MD       albuterol (PROVENTIL) (2.5 MG/3ML) 0.083% nebulizer solution 2.5 mg  2.5 mg Nebulization Q4H PRN Howerter, Justin B, DO   2.5 mg at 06/14/21 1729   aspirin EC tablet 325 mg  325 mg Oral Once Howerter, Justin B, DO       diphenhydrAMINE (BENADRYL) capsule 25 mg  25 mg Oral Q6H PRN Bonnielee Haff, MD   25 mg at 06/14/21 2020   enoxaparin (LOVENOX) injection 40 mg  40 mg Subcutaneous Q24H Bonnielee Haff, MD   40 mg at 06/15/21 0904   feeding supplement (NEPRO CARB STEADY) liquid 237 mL  237 mL Oral TID BM Bonnielee Haff, MD   237 mL at 06/14/21 2016  guaiFENesin (MUCINEX) 12 hr tablet 600 mg  600 mg Oral BID Bonnielee Haff, MD   600 mg at 06/15/21 0903   ipratropium-albuterol (DUONEB) 0.5-2.5 (3) MG/3ML nebulizer solution 3 mL  3 mL Nebulization Q6H Howerter, Justin B, DO   3 mL at 06/15/21 1356   levofloxacin (LEVAQUIN) IVPB 750 mg  750 mg Intravenous Q24H Howerter, Justin B, DO   Stopped at 06/14/21 2358   LORazepam (ATIVAN) injection 0.5 mg  0.5 mg Intravenous Q4H PRN Howerter, Justin B, DO   0.5 mg at 06/14/21 1730   methylPREDNISolone sodium succinate (SOLU-MEDROL) 125 mg/2 mL injection 80 mg  80 mg Intravenous Q12H Howerter, Justin B, DO   80 mg at 06/15/21 1208   ondansetron (ZOFRAN) injection 4 mg  4 mg  Intravenous Q6H PRN Howerter, Justin B, DO       Current Outpatient Medications  Medication Sig Dispense Refill   albuterol (VENTOLIN HFA) 108 (90 Base) MCG/ACT inhaler Inhale 2 puffs into the lungs every 6 (six) hours as needed for wheezing or shortness of breath. 8 g 2   ALPRAZolam (XANAX) 0.5 MG tablet Take 1 tablet (0.5 mg total) by mouth 2 (two) times daily as needed for anxiety. 60 tablet 0   busPIRone (BUSPAR) 5 MG tablet Take 1 tablet (5 mg total) by mouth 3 (three) times daily. 90 tablet 1   chlorpheniramine (CHLOR-TRIMETON) 4 MG tablet Take 4 mg by mouth 2 (two) times daily as needed for allergies.     diphenhydrAMINE (BENADRYL) 25 MG tablet Take 25 mg by mouth every 6 (six) hours as needed.     fluticasone furoate-vilanterol (BREO ELLIPTA) 100-25 MCG/INH AEPB Inhale 1 puff into the lungs daily. 1 each 1   guaiFENesin (MUCINEX) 600 MG 12 hr tablet Take 600 mg by mouth 2 (two) times daily as needed for cough or to loosen phlegm.     ibuprofen (ADVIL) 400 MG tablet Take 200 mg by mouth every 4 (four) hours as needed for mild pain.     Multiple Vitamin (MULTIVITAMIN WITH MINERALS) TABS tablet Take 1 tablet by mouth daily. 90 tablet 1   feeding supplement (ENSURE ENLIVE / ENSURE PLUS) LIQD Take 237 mLs by mouth 2 (two) times daily between meals. 237 mL 12   lidocaine-prilocaine (EMLA) cream Apply to affected area once 30 g 3   ondansetron (ZOFRAN) 8 MG tablet Take 1 tablet (8 mg total) by mouth 2 (two) times daily as needed for refractory nausea / vomiting. 60 tablet 2   prochlorperazine (COMPAZINE) 10 MG tablet Take 1 tablet (10 mg total) by mouth every 6 (six) hours as needed (Nausea or vomiting). 60 tablet 2    OBJECTIVE: Vitals:   06/15/21 1430 06/15/21 1530  BP: 119/76 124/64  Pulse: (!) 103 100  Resp:    Temp:    SpO2: 95% 96%     Body mass index is 32.56 kg/m.    ECOG FS:1 - Symptomatic but completely ambulatory  General: Well-developed, well-nourished, no acute  distress. Eyes: Pink conjunctiva, anicteric sclera. HEENT: Normocephalic, moist mucous membranes. Lungs: No audible wheezing or coughing. Heart: Regular rate and rhythm. Abdomen: Soft, nontender, no obvious distention. Musculoskeletal: No edema, cyanosis, or clubbing. Neuro: Alert, answering all questions appropriately. Cranial nerves grossly intact. Skin: No rashes or petechiae noted. Psych: Normal affect.  LAB RESULTS:  Lab Results  Component Value Date   NA 136 06/15/2021   K 3.9 06/15/2021   CL 91 (L) 06/15/2021   CO2 35 (H)  06/15/2021   GLUCOSE 127 (H) 06/15/2021   BUN 25 (H) 06/15/2021   CREATININE 0.66 06/15/2021   CALCIUM 9.3 06/15/2021   PROT 6.7 06/14/2021   ALBUMIN 3.3 (L) 06/14/2021   AST 23 06/14/2021   ALT 26 06/14/2021   ALKPHOS 111 06/14/2021   BILITOT 0.9 06/14/2021   GFRNONAA >60 06/15/2021    Lab Results  Component Value Date   WBC 16.2 (H) 06/15/2021   NEUTROABS 10.2 (H) 06/14/2021   HGB 14.8 06/15/2021   HCT 44.9 06/15/2021   MCV 83.1 06/15/2021   PLT 232 06/15/2021     STUDIES: CT Angio Chest PE W and/or Wo Contrast  Result Date: 06/07/2021 CLINICAL DATA:  Tachycardia, vomiting, metastatic lung cancer EXAM: CT ANGIOGRAPHY CHEST WITH CONTRAST TECHNIQUE: Multidetector CT imaging of the chest was performed using the standard protocol during bolus administration of intravenous contrast. Multiplanar CT image reconstructions and MIPs were obtained to evaluate the vascular anatomy. CONTRAST:  53m OMNIPAQUE IOHEXOL 350 MG/ML SOLN COMPARISON:  06/08/2011, 05/15/2021, FINDINGS: Cardiovascular: This is a technically adequate evaluation of the pulmonary vasculature. No filling defects or pulmonary emboli. Stable left hilar soft tissue mass encasing the left upper and left lower pulmonary arteries. Stable trace pericardial effusion. The heart is not enlarged. No evidence of thoracic aortic aneurysm or dissection. Mild atherosclerosis of the aorta and coronary  vessels. Mediastinum/Nodes: There is segmental wall thickening of the mid to distal thoracic esophagus. Confluent soft tissue within the mediastinum and bilateral hila consistent with adenopathy, not grossly changed since prior study. An enlarged left supraclavicular lymph node on image 9/4 measures 13 mm in short axis, unchanged. Thyroid and trachea are unremarkable. Lungs/Pleura: There are trace bilateral pleural effusions. Since the prior exam, there is progressive near complete consolidation of the left upper lobe, which may reflect progressive disease and/or postobstructive change. There is diffuse bilateral bronchial wall thickening, greatest at the lung bases. Continued right middle lobe collapse with occlusion of the right middle lobe bronchus again noted. Upper Abdomen: There are stable bilateral adrenal nodules, measuring up to 2.2 cm on the right. Multiple enlarged lymph nodes are again seen within the central upper abdomen, largest measuring 1.7 cm just posterior to the gastric cardia. Multiple hypodensities are again seen within the liver consistent with metastatic disease, grossly unchanged since prior study. Musculoskeletal: Stable sclerotic focus within the left T10 transverse process and within the T9 vertebral body, compatible with bony metastases. No acute displaced fractures. Reconstructed images demonstrate no additional findings. Review of the MIP images confirms the above findings. IMPRESSION: 1. No evidence of pulmonary embolus. 2. Persistent left hilar mass surrounding the left hilar vessels, with increasing near total consolidation of the left upper lobe since prior study. This may be progression of malignancy versus postobstructive change. 3. Continued coalescing lymphadenopathy throughout the mediastinum and bilateral hila. 4. Bilateral bronchial wall thickening, greatest at the lung bases. 5. Trace bilateral pleural effusions. 6. Segmental wall thickening of the mid to distal thoracic  esophagus, which could be inflammatory or infectious. 7. Upper abdominal metastases with continued multiple liver lesions, bilateral adrenal nodularity, and upper abdominal adenopathy. Electronically Signed   By: MRanda NgoM.D.   On: 06/07/2021 15:12   MR BRAIN W WO CONTRAST  Result Date: 05/18/2021 CLINICAL DATA:  Lung cancer diagnosis.  Staging. EXAM: MRI HEAD WITHOUT AND WITH CONTRAST TECHNIQUE: Multiplanar, multiecho pulse sequences of the brain and surrounding structures were obtained without and with intravenous contrast. CONTRAST:  129mGADAVIST GADOBUTROL 1 MMOL/ML  IV SOLN COMPARISON:  None. FINDINGS: Brain: Study suffers from motion degradation. Diffusion imaging does not show any acute or subacute infarction or other cause of restricted diffusion. No evidence of chronic small vessel disease or old cortical infarction. Very few punctate foci of T2 and FLAIR signal within the white matter. No sign of mass lesion, hemorrhage, hydrocephalus or extra-axial collection. No abnormal contrast enhancement occurs. Vascular: Major vessels at the base of the brain show flow. Skull and upper cervical spine: Negative Sinuses/Orbits: Clear/normal Other: None IMPRESSION: No evidence of regional metastatic disease. Sensitivity is slightly limited by motion degradation and utilization of fast scanning protocol. I do not see any suspicious findings. Electronically Signed   By: Nelson Chimes M.D.   On: 05/18/2021 13:44   MR LIVER W WO CONTRAST  Result Date: 05/18/2021 CLINICAL DATA:  Inpatient. Acute respiratory failure with infiltrative left upper lobe soft tissue and bilateral mediastinal adenopathy suspicious on chest CT for lung malignancy. Indeterminate liver and bilateral adrenal lesions on CT abdomen study. EXAM: MRI ABDOMEN WITHOUT AND WITH CONTRAST TECHNIQUE: Multiplanar multisequence MR imaging of the abdomen was performed both before and after the administration of intravenous contrast. CONTRAST:  42m  GADAVIST GADOBUTROL 1 MMOL/ML IV SOLN COMPARISON:  05/15/2021 CT abdomen/pelvis and chest CT angiogram. FINDINGS: Substantially motion degraded exam, limiting assessment. Lower chest: Small right and trace left dependent pleural effusions. Geographic consolidation in the visualized lingula and right middle lobe. Hepatobiliary: Normal liver size and configuration. No hepatic steatosis. There are several (at least 5) mildly T2 hyperintense T1 hypointense hypoenhancing liver masses scattered throughout the liver with restricted diffusion, compatible with metastatic disease, largest 1.5 x 1.3 cm in the segment 6 right liver (series 4/image 20), 1.3 x 1.1 cm in the anterior segment 4 left liver (series 4/image 18) and 1.1 x 1.1 cm in the segment 8 right liver (series 4/image 9). Normal gallbladder with no cholelithiasis. No biliary ductal dilatation. Common bile duct diameter 4 mm. No choledocholithiasis. No biliary masses, strictures or beading. Pancreas: No pancreatic mass or duct dilation.  No pancreas divisum. Spleen: Normal size. No mass. Adrenals/Urinary Tract: Enhancing right adrenal 2.5 x 1.7 cm nodule (series 3/image 18) demonstrates no significant loss of signal intensity on chemical shift imaging and is indeterminate. Similar enhancing left adrenal 1.5 x 1.3 cm nodule (series 3/image 23), too small to characterize for intralesional lipid on the motion degraded chemical shift images. No hydronephrosis. Scattered bilateral simple renal cysts, largest 1.6 cm in the anterior upper left kidney. T2 hypointense T1 hyperintense 1.1 cm renal cortical lesion in the lower right kidney (series 4/image 33) without compelling enhancement on the motion degraded postcontrast sequences, compatible with hemorrhagic/proteinaceous Bosniak category 2 renal cyst. No overtly suspicious renal masses. Stomach/Bowel: Normal non-distended stomach. Visualized small and large bowel is normal caliber, with no bowel wall thickening.  Vascular/Lymphatic: Normal caliber abdominal aorta. Patent portal, splenic, hepatic and renal veins. A few mildly enlarged left para-aortic nodes, largest 1.2 cm short axis diameter (series 18/image 66). Enlarged 1.5 cm high left retroperitoneal node anterior to the left diaphragmatic crus (series 18/image 37). Other: No abdominal ascites or focal fluid collection. Musculoskeletal: Enhancing inferior T9 vertebral 2.8 cm and right T10 vertebral 1.1 cm bone lesions (series 17/image 19). Enhancing 2.1 cm medial right iliac bone lesion (series 17/image 19). IMPRESSION: 1. Limited motion degraded scan. 2. Several (at least 5) hypoenhancing liver masses scattered throughout the liver, with MRI features compatible with metastatic disease. Percutaneous biopsy of the 1.5 cm  segment 6 right liver or 1.3 cm anterior segment 4 left liver lesions could be considered. 3. Indeterminate enhancing bilateral adrenal nodules, measuring 2.5 cm on the right and 1.5 cm on the left. Metastatic disease is not excluded. 4. Left para-aortic and high left retroperitoneal lymphadenopathy suspicious for metastatic disease. 5. Enhancing T9 and T10 vertebral and medial right iliac bone lesions, suspicious for bone metastases. 6. Small right and trace left dependent pleural effusions. Geographic consolidation in the visualized lingula and right middle lobe, nonspecific, please see recent chest CT angiogram study for details. Electronically Signed   By: Ilona Sorrel M.D.   On: 05/18/2021 16:39   NM PET Image Initial (PI) Skull Base To Thigh  Result Date: 06/14/2021 CLINICAL DATA:  Initial treatment strategy for lung nodule. Lymphadenopathy with liver lesions. EXAM: NUCLEAR MEDICINE PET SKULL BASE TO THIGH TECHNIQUE: 12.0 mCi F-18 FDG was injected intravenously. Full-ring PET imaging was performed from the skull base to thigh after the radiotracer. CT data was obtained and used for attenuation correction and anatomic localization. Fasting blood  glucose: 122 mg/dl COMPARISON:  CTA Chest 06/07/2021 FINDINGS: Mediastinal blood pool activity: SUV max 0.8 Liver activity: SUV max NA NECK: 12 mm left cervical node on 80/3 is hypermetabolic with SUV max = 4.3. Small hypermetabolic lymph nodes evident right lower neck. Bilateral supraclavicular hypermetabolic lymph nodes including 11 mm left supraclavicular node on 60/3 with SUV max = 7.4. Markedly Markedly hypermetabolic left thyroid nodule measures 1.4 cm on image 60/3 with SUV max = 21.7. Incidental CT findings: none CHEST: Hypermetabolic mediastinal lymphadenopathy evident. Index subcarinal measures 18 mm short axis on 01/02/3 with SUV max = 7.0 hypermetabolic lymph nodes are seen in both hilar regions into the prevascular space. 16 mm prevascular node on 87/3 demonstrates SUV max = 7.0. Marked wall thickening noted in the mid esophagus, at approximately the level of the carina. Although somewhat difficult to assess given the adjacent hypermetabolic lymphadenopathy, there does appear to be marked hypermetabolism in the wall of this abnormal appearing esophagus with SUV max = 7.7. Areas of collapse/consolidation in the left upper lobe, right middle lobe and infrahilar left lower lobe show substantial hypermetabolism. There is diffuse interstitial opacity in both lungs with innumerable tiny bilateral pulmonary nodules. Incidental CT findings: Trace pericardial effusion. Right Port-A-Cath tip is positioned in the distal SVC. Coronary artery calcification is evident. Mild atherosclerotic calcification is noted in the wall of the thoracic aorta. ABDOMEN/PELVIS: Multiple hypermetabolic liver lesions evident. 18 mm subcapsular lesion anterior left liver on 150/3 demonstrates SUV max = 7.7. Hypermetabolic gastrohepatic ligament lymph nodes measure up to 14 mm on 150/3 with SUV max = 6.6 12 mm left para-aortic node on 212/2 is hypermetabolic with SUV max = 4.4. 2.4 cm right adrenal nodule shows low attenuation,  suggesting adenoma with associated hypermetabolism SUV max = 5.3. Incidental CT findings: Prostate gland is enlarged. Scattered diverticuli noted left colon without diverticulitis. There is mild atherosclerotic calcification of the abdominal aorta without aneurysm. SKELETON: Scattered hypermetabolic bone metastases evident. 12 mm posterior right iliac sclerotic lesion on 02/20 8/3 shows SUV max = 5.6. Sacral metastases demonstrate SUV max = 5.3 subtle 13 mm left femoral neck lesion demonstrates SUV max = 5.3. Incidental CT findings: none IMPRESSION: 1. Similar right middle and left upper lobe collapse with associated marked hypermetabolism. There is diffuse interstitial and micro nodularity in both lungs consistent with metastatic disease. 2. Hypermetabolic metastatic lymphadenopathy in the neck, mediastinum, hilar regions, gastrohepatic ligament, and retroperitoneum. 3.  Marked hypermetabolism identified in the mid esophagus with associated esophageal wall thickening. This may be related to secondary involvement although primary esophageal neoplasm not excluded. 4. Hypermetabolic liver and bone metastases. 5. Hypermetabolic right adrenal nodule. This is felt to be likely an adenoma although metastatic involvement is certainly possible. 6. Hypermetabolic left thyroid nodule. Recommend thyroid US and biopsy. (Ref: J Am Coll Radiol. 2015 Feb;12(2): 143-50). 7.  Aortic Atherosclerois (ICD10-170.0) Electronically Signed   By: Misty Stanley M.D.   On: 06/14/2021 12:27   DG Chest Port 1 View  Result Date: 06/14/2021 CLINICAL DATA:  Dyspnea EXAM: PORTABLE CHEST 1 VIEW COMPARISON:  06/13/2021 FINDINGS: Cardiac shadow is stable. Right chest wall port is again seen and stable. Volume loss in the left hemithorax is noted with diffuse increased airspace opacity stable from the previous day. Mild right basilar opacity is noted stable in appearance as well. No new focal abnormality is noted. IMPRESSION: Stable bilateral  airspace opacities similar to that seen on recent CT and plain film examination. Electronically Signed   By: Inez Catalina M.D.   On: 06/14/2021 19:39   DG Chest Port 1 View  Result Date: 06/13/2021 CLINICAL DATA:  Shortness of breath.  Recent pneumonia. EXAM: PORTABLE CHEST 1 VIEW COMPARISON:  Radiograph and chest CT 06/07/2021. FINDINGS: Right chest port remains in place. Similar volume loss in the left hemithorax with diffusely increased opacity, opacity throughout the left upper lobe with seen on recent CT. Opacity at the right lung base corresponds to right middle lobe collapse. Background bronchial thickening. Trace pleural effusions on CT are not well demonstrated by radiograph. No pneumothorax or significant change from prior imaging. Heart size is grossly stable. IMPRESSION: 1. No significant change from prior imaging. 2. Volume loss in the left hemithorax with diffusely increased opacity throughout the left upper lung zone, upper lobe consolidation seen on recent CT. 3. Right lung base opacity corresponds to right middle lobe collapse. 4. Background bronchial thickening. Electronically Signed   By: Keith Rake M.D.   On: 06/13/2021 18:36   DG Chest Portable 1 View  Result Date: 06/07/2021 CLINICAL DATA:  Cough, shortness of breath. EXAM: PORTABLE CHEST 1 VIEW COMPARISON:  May 14, 2021. FINDINGS: Stable cardiomediastinal silhouette. Interval placement of right internal jugular Port-A-Cath with distal tip in expected position of the SVC. No pneumothorax is noted. Left upper lobe consolidation is noted concerning for pneumonia or atelectasis. Probable central pulmonary vascular congestion is noted. Bony thorax is unremarkable. IMPRESSION: Left upper lobe opacity is noted concerning for pneumonia or atelectasis. Electronically Signed   By: Marijo Conception M.D.   On: 06/07/2021 12:13   ECHOCARDIOGRAM COMPLETE  Result Date: 06/08/2021    ECHOCARDIOGRAM REPORT   Patient Name:   Derek Clarke  Date of Exam: 06/08/2021 Medical Rec #:  [id number]      Height:       69.0 in Accession #:    [id number]     Weight:       220.7 lb Date of Birth:  Feb 02, 1956      BSA:          2.154 m Patient Age:    64 years       BP:           130/81 mmHg Patient Gender: M              HR:           108 bpm. Exam Location:  [facility] Procedure: 2D  Echo, Cardiac Doppler, Color Doppler and Strain Analysis Indications:    CHF-acute diastolic O03.70  History:        Patient has no prior history of Echocardiogram examinations.                 COPD.  Sonographer:    Sherrie Sport Referring Phys: [id number] Ivor Costa Diagnosing      Kate Sable MD Phys:  Sonographer Comments: Suboptimal apical window. Global longitudinal strain was attempted. IMPRESSIONS  1. Left ventricular ejection fraction, by estimation, is 65 to 70%. The left ventricle has normal function. The left ventricle has no regional wall motion abnormalities. Left ventricular diastolic parameters are consistent with Grade I diastolic dysfunction (impaired relaxation).  2. Right ventricular systolic function is normal. The right ventricular size is mildly enlarged.  3. A small pericardial effusion is present.  4. The mitral valve is normal in structure. No evidence of mitral valve regurgitation.  5. The aortic valve is tricuspid. Aortic valve regurgitation is not visualized. FINDINGS  Left Ventricle: Left ventricular ejection fraction, by estimation, is 65 to 70%. The left ventricle has normal function. The left ventricle has no regional wall motion abnormalities. Global longitudinal strain performed but not reported based on interpreter judgement due to suboptimal tracking. 3D left ventricular ejection fraction analysis performed but not reported based on interpreter judgement due to suboptimal quality. The left ventricular internal cavity size was normal in size. There is no left ventricular hypertrophy. Left ventricular diastolic parameters are consistent with Grade I diastolic  dysfunction (impaired relaxation). Right Ventricle: The right ventricular size is mildly enlarged. No increase in right ventricular wall thickness. Right ventricular systolic function is normal. Left Atrium: Left atrial size was normal in size. Right Atrium: Right atrial size was normal in size. Pericardium: A small pericardial effusion is present. Mitral Valve: The mitral valve is normal in structure. No evidence of mitral valve regurgitation. Tricuspid Valve: The tricuspid valve is grossly normal. Tricuspid valve regurgitation is trivial. Aortic Valve: The aortic valve is tricuspid. Aortic valve regurgitation is not visualized. Aortic valve mean gradient measures 6.0 mmHg. Aortic valve peak gradient measures 10.5 mmHg. Aortic valve area, by VTI measures 2.89 cm. Pulmonic Valve: The pulmonic valve was not well visualized. Pulmonic valve regurgitation is not visualized. Aorta: The aortic root is normal in size and structure. IAS/Shunts: No atrial level shunt detected by color flow Doppler.  LEFT VENTRICLE PLAX 2D LVIDd:         4.80 cm  Diastology LVIDs:         2.60 cm  LV e' medial:   1.96 cm/s LV PW:         1.30 cm  LV E/e' medial: 38.7 LV IVS:        1.20 cm LVOT diam:     2.10 cm LV SV:         76 LV SV Index:   35 LVOT Area:     3.46 cm 3D Volume EF:                         3D EF:        52 %                         LV EDV:       96 ml  LV ESV:       46 ml                         LV SV:        50 ml RIGHT VENTRICLE RV S prime:     14.00 cm/s TAPSE (M-mode): 5.7 cm LEFT ATRIUM             Index       RIGHT ATRIUM           Index LA diam:        3.30 cm 1.53 cm/m  RA Area:     21.00 cm LA Vol (A2C):   64.2 ml 29.80 ml/m RA Volume:   55.60 ml  25.81 ml/m LA Vol (A4C):   31.2 ml 14.48 ml/m LA Biplane Vol: 45.4 ml 21.07 ml/m  AORTIC VALVE                    PULMONIC VALVE AV Area (Vmax):    2.39 cm     PV Vmax:       0.73 m/s AV Area (Vmean):   2.78 cm     PV Peak Clarke:  2.1 mmHg  AV Area (VTI):     2.89 cm AV Vmax:           162.00 cm/s AV Vmean:          108.000 cm/s AV VTI:            0.261 m AV Peak Clarke:      10.5 mmHg AV Mean Clarke:      6.0 mmHg LVOT Vmax:         112.00 cm/s LVOT Vmean:        86.800 cm/s LVOT VTI:          0.218 m LVOT/AV VTI ratio: 0.84  AORTA Ao Root diam: 3.30 cm MITRAL VALVE                TRICUSPID VALVE MV Area (PHT): 3.95 cm     TR Peak Clarke:   30.0 mmHg MV Decel Time: 192 msec     TR Vmax:        274.00 cm/s MV E velocity: 75.80 cm/s MV A velocity: 119.00 cm/s  SHUNTS MV E/A ratio:  0.64         Systemic VTI:  0.22 m                             Systemic Diam: 2.10 cm Kate Sable MD Electronically signed by Kate Sable MD Signature Date/Time: 06/08/2021/3:10:53 PM    Final    [place] CORE BIOPSY (LYMPH NODES)  Result Date: 05/20/2021 INDICATION: Possible lung carcinoma by prior imaging with vague liver lesions and also enlarged left supraclavicular lymph node by CT. Due to oxygen requirement and poor respiratory status, biopsy of the left supraclavicular lymph node is performed without sedation as tolerance a liver biopsy currently with sedation would be borderline and potentially high risk. EXAM: ULTRASOUND GUIDED CORE BIOPSY OF LEFT SUPRACLAVICULAR LYMPH NODE MEDICATIONS: None. ANESTHESIA/SEDATION: None PROCEDURE: The procedure, risks, benefits, and alternatives were explained to the patient. Questions regarding the procedure were encouraged and answered. The patient understands and consents to the procedure. A time-out was performed prior to initiating the procedure. The left neck was prepped with chlorhexidine in a sterile fashion, and a sterile drape was applied  covering the operative field. A sterile gown and sterile gloves were used for the procedure. Local anesthesia was provided with 1% Lidocaine. After localizing an enlarged left supraclavicular lymph node, an 18 gauge core biopsy device was utilized in obtaining 4 separate core biopsy  samples. Material was submitted in formalin. Additional ultrasound was performed. COMPLICATIONS: None immediate. FINDINGS: Several left supraclavicular and lower cervical lymph nodes are identified. The largest measures approximately 1.7 x 1.3 x 1.4 cm by ultrasound. Solid core biopsy samples were obtained from this lymph node. IMPRESSION: Ultrasound-guided core biopsy performed enlarged left supraclavicular lymph node. Electronically Signed   By: Aletta Edouard M.D.   On: 05/20/2021 11:02   IR IMAGING GUIDED PORT INSERTION  Result Date: 06/03/2021 INDICATION: Metastatic lung cancer.  Chemotherapy. EXAM: IMPLANTED PORT A CATH PLACEMENT WITH ULTRASOUND AND FLUOROSCOPIC GUIDANCE MEDICATIONS: None ANESTHESIA/SEDATION: Moderate (conscious) sedation was employed during this procedure. A total of Versed 0 mg and Fentanyl 25 mcg was administered intravenously. Moderate Sedation Time: 20 minutes. The patient's level of consciousness and vital signs were monitored continuously by radiology nursing throughout the procedure under my direct supervision. FLUOROSCOPY TIME:  0 minutes, 24 seconds (4.5 mGy) COMPLICATIONS: None immediate. PROCEDURE: The procedure, risks, benefits, and alternatives were explained to the patient. Questions regarding the procedure were encouraged and answered. The patient understands and consents to the procedure. The neck and chest were prepped with chlorhexidine in a sterile fashion, and a sterile drape was applied covering the operative field. Maximum barrier sterile technique with sterile gowns and gloves were used for the procedure. A timeout was performed prior to the initiation of the procedure. Local anesthesia was provided with 1% lidocaine with epinephrine. After creating a small venotomy incision, a micropuncture kit was utilized to access the internal jugular vein under direct, real-time ultrasound guidance. Ultrasound image documentation was performed. The microwire was kinked to  measure appropriate catheter length. A subcutaneous port pocket was then created along the upper chest wall utilizing a combination of sharp and blunt dissection. The pocket was irrigated with sterile saline. A single lumen ISP power injectable port was chosen for placement. The 8 Fr catheter was tunneled from the port pocket site to the venotomy incision. The port was placed in the pocket. The external catheter was trimmed to appropriate length. At the venotomy, an 8 Fr peel-away sheath was placed over a guidewire under fluoroscopic guidance. The catheter was then placed through the sheath and the sheath was removed. Final catheter positioning was confirmed and documented with a fluoroscopic spot radiograph. The port was accessed with a Huber needle, aspirated and flushed with heparinized saline. The port pocket incision was closed with interrupted 3-0 Vicryl suture then Dermabond was applied, including at the venotomy incision. Dressings were placed. The patient tolerated the procedure well without immediate post procedural complication. IMPRESSION: Successful placement of a right internal jugular approach power injectable Port-A-Cath. The catheter is ready for immediate use. Michaelle Birks, MD [facility] Electronically Signed   By: Michaelle Birks M.D.   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 Plan to start chemotherapy with carboplatinum, Taxol, and Avastin tomorrow.  Appreciate palliative care input. Shortness of breath/COPD exacerbation: Improving. Patient requires approximately 4 L nasal cannula at baseline.  Continue  steroids and current antibiotics.   Leukocytosis:Likely reactive.  Monitor.  Will follow.  Lloyd Huger, MD    06/15/2021 4:09 PM

## 2021-06-15 NOTE — ED Notes (Signed)
Gave pt [place] Ice per his request.

## 2021-06-15 NOTE — ED Notes (Signed)
Dietary called at this time to request feeding supplement for breakfast, and one to be given with lunch.

## 2021-06-15 NOTE — Consult Note (Signed)
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  He started having a little hoarseness about a month ago and this is progressed.  He was admitted to the hospital with a COPD exacerbation on 913.  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  His PET scan is positive for the mass plus metastatic disease in the lungs and liver.  He had a swallowing study done yesterday that showed significant difficulty with speech and shortness of breath when trying to talk.  He could only tolerate thickened liquids and some pured foods.  Anything liquids would give him significant cough.  ENT was consulted to evaluate his airway and get a look at his vocal cords to see if there is a laryngeal source for some of his hoarseness and dysphagia.  Allergies: No Known Allergies  ROS: Review of systems normal other than 12 systems except per HPI.  PMH:  Past Medical History:  Diagnosis Date   Adenocarcinoma ([place])    lung; stage 4 with mets to liver   Chronic respiratory failure with hypoxia (HCC)    baseline 4 L [place]   COPD (chronic obstructive pulmonary disease) (HCC)    GAD (generalized anxiety disorder)     FH:  Family History  Problem Relation Age of Onset   Lung disease Mother    Dementia Father     SH:  Social History   Socioeconomic History   Marital status: Single    Spouse name: Not on file   Number of children: Not on file   Years of education: Not on file   Highest education level: Not on file  Occupational History   Not on file  Tobacco Use   Smoking status: Former    Packs/day: 1.00    Years: 30.00    Pack years: 30.00    Types: Cigarettes    Quit date: 03/26/2021    Years since quitting: 0.2   Smokeless tobacco: Never   Substance and Sexual Activity   Alcohol use: Yes   Drug use: Yes    Types: Marijuana    Comment: quit approx. 5 years ago   Sexual activity: Not on file  Other Topics Concern   Not on file  Social History Narrative   Lives by himself. Brother Frederick "Wilson stay with pt. If needed    Social Determinants of Health   Financial Resource Strain: Not on file  Food Insecurity: Not on file  Transportation Needs: Not on file  Physical Activity: Not on file  Stress: Not on file  Social Connections: Not on file  Intimate Partner Violence: Not on file    PSH:  Past Surgical History:  Procedure Laterality Date   IR IMAGING GUIDED PORT INSERTION  06/03/2021    Physical  Exam: The patient is awake and alert in the emergency room.  His voice is very breathy and hoarse.  He can only say a word or 2 and then has to stop and take a breath again.  If he tries to talk more he gets short of breath.  He is on nasal cannula is currently his nose looks open anteriorly.  His oropharynx shows good set of teeth.  No oral lesions.  Good tongue mobility.  His palate  elevates midline and there is no redness of the posterior pharynx.  There is no significant tonsil tissue anywhere.  His neck feels full and firm anteriorly but there is no sign of any neck masses that I can feel.  Flexible laryngoscopy is done through his right nostril.  This is dictated in detail elsewhere.  This shows a normal right vocal cord and no lesions of the larynx or hypopharynx.  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  There is no specific treatment for this  currently.  He does need to remain on a limited diet with thickened liquids and pured foods.  He is at high risk for aspiration with any thin liquids.  This also impacts his ability to speak because he has too much air leak with vocalization.  It is possible to augment his left vocal cord with a [place] stent if there is no expected return of function of the recurrent laryngeal nerve.  The vocal cord paralysis does not affect his ability to breathe, but only makes him short of breath when he tries to vocalize more because of the added work of creating sound.  He is to start his cancer treatment this week and its possible if the tumor shrinks and responds better that he may get some return of function in the future, but I really think this is unlikely and he will more likely have paralysis of the left cord long-term.   Derek Clarke 06/15/2021 9:32 AM

## 2021-06-15 NOTE — ED Notes (Signed)
Messaged with MD and speech pathologist and notified them of pt frustration with nectar thick liquids and Nepro.  Pt states that he feels like they get stuck in his throat and do not go all the way down. Pt states that he would like to go back to thin liquids.Requested MD and Speech consult with pt.

## 2021-06-16 ENCOUNTER — Inpatient Hospital Stay: Payer: Non-veteran care

## 2021-06-16 ENCOUNTER — Encounter: Payer: Self-pay | Admitting: *Deleted

## 2021-06-16 ENCOUNTER — Inpatient Hospital Stay: Payer: Non-veteran care | Admitting: Oncology

## 2021-06-16 VITALS — BP 130/89 | HR 86 | Temp 97.3°F | Resp 18

## 2021-06-16 DIAGNOSIS — C3492 Malignant neoplasm of unspecified part of left bronchus or lung: Secondary | ICD-10-CM

## 2021-06-16 LAB — CBC
HCT: 43.6 % (ref 39.0–52.0)
Hemoglobin: 14.9 g/dL (ref 13.0–17.0)
MCH: 28.7 pg (ref 26.0–34.0)
MCHC: 34.2 g/dL (ref 30.0–36.0)
MCV: 84 fL (ref 80.0–100.0)
Platelets: 245 10*3/uL (ref 150–400)
RBC: 5.19 MIL/uL (ref 4.22–5.81)
RDW: 12.7 % (ref 11.5–15.5)
WBC: 15.7 10*3/uL — ABNORMAL HIGH (ref 4.0–10.5)
nRBC: 0 % (ref 0.0–0.2)

## 2021-06-16 LAB — URINALYSIS, DIPSTICK ONLY
Bilirubin Urine: NEGATIVE
Glucose, UA: NEGATIVE mg/dL
Hgb urine dipstick: NEGATIVE
Ketones, ur: NEGATIVE mg/dL
Leukocytes,Ua: NEGATIVE
Nitrite: NEGATIVE
Protein, ur: NEGATIVE mg/dL
Specific Gravity, Urine: 1.034 — ABNORMAL HIGH (ref 1.005–1.030)
pH: 5 (ref 5.0–8.0)

## 2021-06-16 LAB — BASIC METABOLIC PANEL
Anion gap: 10 (ref 5–15)
BUN: 23 mg/dL (ref 8–23)
CO2: 33 mmol/L — ABNORMAL HIGH (ref 22–32)
Calcium: 9.5 mg/dL (ref 8.9–10.3)
Chloride: 92 mmol/L — ABNORMAL LOW (ref 98–111)
Creatinine, Ser: 0.74 mg/dL (ref 0.61–1.24)
GFR, Estimated: >60 mL/min (ref >60)
Glucose, Bld: 118 mg/dL — ABNORMAL HIGH (ref 70–99)
Potassium: 3.6 mmol/L (ref 3.5–5.1)
Sodium: 135 mmol/L (ref 135–145)

## 2021-06-16 MED ORDER — SODIUM CHLORIDE 0.9% FLUSH
10.0000 mL | INTRAVENOUS | Status: DC | PRN
  Filled 2021-06-16: qty 10

## 2021-06-16 MED ORDER — DIPHENHYDRAMINE HCL 50 MG/ML IJ SOLN
25.0000 mg | Freq: Once | INTRAMUSCULAR | Status: AC
  Administered 2021-06-16: 25 mg via INTRAVENOUS
  Filled 2021-06-16: qty 1

## 2021-06-16 MED ORDER — SODIUM CHLORIDE 0.9 % IV SOLN
900.0000 mg | Freq: Once | INTRAVENOUS | Status: AC
  Administered 2021-06-16: 900 mg via INTRAVENOUS
  Filled 2021-06-16: qty 90

## 2021-06-16 MED ORDER — FAMOTIDINE 20 MG IN NS 100 ML IVPB
20.0000 mg | Freq: Once | INTRAVENOUS | Status: AC
  Administered 2021-06-16: 20 mg via INTRAVENOUS
  Filled 2021-06-16: qty 20

## 2021-06-16 MED ORDER — FOSAPREPITANT DIMEGLUMINE INJECTION 150 MG
150.0000 mg | Freq: Once | INTRAVENOUS | Status: AC
  Administered 2021-06-16: 150 mg via INTRAVENOUS
  Filled 2021-06-16: qty 150

## 2021-06-16 MED ORDER — HEPARIN SOD (PORK) LOCK FLUSH 100 UNIT/ML IV SOLN
INTRAVENOUS | Status: AC
  Administered 2021-06-16: 500 [IU]
  Filled 2021-06-16: qty 5

## 2021-06-16 MED ORDER — SODIUM CHLORIDE 0.9 % IV SOLN: 1400.0000 mg | Freq: Once | INTRAVENOUS | Status: DC

## 2021-06-16 MED ORDER — DEXAMETHASONE SODIUM PHOSPHATE 100 MG/10ML IJ SOLN
10.0000 mg | Freq: Once | INTRAMUSCULAR | Status: AC
  Administered 2021-06-16: 10 mg via INTRAVENOUS
  Filled 2021-06-16: qty 10

## 2021-06-16 MED ORDER — HEPARIN SOD (PORK) LOCK FLUSH 100 UNIT/ML IV SOLN
500.0000 [IU] | Freq: Once | INTRAVENOUS | Status: AC | PRN
  Filled 2021-06-16: qty 5

## 2021-06-16 MED ORDER — SODIUM CHLORIDE 0.9 % IV SOLN
Freq: Once | INTRAVENOUS | Status: AC
  Filled 2021-06-16: qty 250

## 2021-06-16 MED ORDER — PACLITAXEL CHEMO INJECTION 300 MG/50ML
432.0000 mg | Freq: Once | INTRAVENOUS | Status: AC
  Administered 2021-06-16: 432 mg via INTRAVENOUS
  Filled 2021-06-16: qty 72

## 2021-06-16 MED ORDER — PALONOSETRON HCL INJECTION 0.25 MG/5ML
0.2500 mg | Freq: Once | INTRAVENOUS | Status: AC
  Administered 2021-06-16: 0.25 mg via INTRAVENOUS
  Filled 2021-06-16: qty 5

## 2021-06-16 MED ORDER — GUAIFENESIN ER 600 MG PO TB12
600.0000 mg | ORAL_TABLET | Freq: Once | ORAL | Status: AC
  Administered 2021-06-16: 600 mg via ORAL
  Filled 2021-06-16: qty 1

## 2021-06-16 NOTE — Progress Notes (Signed)
Pt requesting breathing treatment. Pt missed afternoon treatment due to being off the floor for chemo. Called respiratory and they will be up shortly. Night nurse made aware.

## 2021-06-16 NOTE — Patient Instructions (Signed)
[facility]  Discharge Instructions: Thank you for choosing [facility] to provide your oncology and hematology care.  If you have a lab appointment with the [facility], please go directly to the [facility] and check in at the registration area.  Wear comfortable clothing and clothing appropriate for easy access to any Portacath or PICC line.   We strive to give you quality time with your provider. You may need to reschedule your appointment if you arrive late (15 or more minutes).  Arriving late affects you and other patients whose appointments are after yours.  Also, if you miss three or more appointments without notifying the office, you may be dismissed from the clinic at the provider's discretion.      For prescription refill requests, have your pharmacy contact our office and allow 72 hours for refills to be completed.    Today you received the following chemotherapy and/or immunotherapy agents Taxol and Carboplatin       To help prevent nausea and vomiting after your treatment, we encourage you to take your nausea medication as directed.  BELOW ARE SYMPTOMS THAT SHOULD BE REPORTED IMMEDIATELY: *FEVER GREATER THAN 100.4 F (38 C) OR HIGHER *CHILLS OR SWEATING *NAUSEA AND VOMITING THAT IS NOT CONTROLLED WITH YOUR NAUSEA MEDICATION *UNUSUAL SHORTNESS OF BREATH *UNUSUAL BRUISING OR BLEEDING *URINARY PROBLEMS (pain or burning when urinating, or frequent urination) *BOWEL PROBLEMS (unusual diarrhea, constipation, pain near the anus) TENDERNESS IN MOUTH AND THROAT WITH OR WITHOUT PRESENCE OF ULCERS (sore throat, sores in mouth, or a toothache) UNUSUAL RASH, SWELLING OR PAIN  UNUSUAL VAGINAL DISCHARGE OR ITCHING   Items with * indicate a potential emergency and should be followed up as soon as possible or go to the Emergency Department if any problems should occur.  Please show the CHEMOTHERAPY ALERT CARD or IMMUNOTHERAPY ALERT CARD  at check-in to the Emergency Department and triage nurse.  Should you have questions after your visit or need to cancel or reschedule your appointment, please contact [facility]  [phone] and follow the prompts.  Office hours are 8:00 a.m. to 4:30 p.m. Monday - Friday. Please note that voicemails left after 4:00 p.m. may not be returned until the following business day.  We are closed weekends and major holidays. You have access to a nurse at all times for urgent questions. Please call the main number to the clinic [phone] and follow the prompts.  For any non-urgent questions, you may also contact your provider using MyChart. We now offer e-Visits for anyone 28 and older to request care online for non-urgent symptoms. For details visit [email or web address].   Also download the MyChart app! Go to the app store, search "MyChart", open the app, select [facility], and log in with your MyChart username and password.  Due to Covid, a mask is required upon entering the hospital/clinic. If you do not have a mask, one will be given to you upon arrival. For doctor visits, patients may have 1 support person aged 84 or older with them. For treatment visits, patients cannot have anyone with them due to current Covid guidelines and our immunocompromised population.     Paclitaxel injection What is this medication? PACLITAXEL (PAK li TAX el) is a chemotherapy drug. It targets fast dividing cells, like cancer cells, and causes these cells to die. This medicine is used to treat ovarian cancer, breast cancer, lung cancer, Kaposi's sarcoma, and other cancers. This medicine may be  used for other purposes; ask your health care provider or pharmacist if you have questions. COMMON BRAND NAME(S): Onxol, Taxol What should I tell my care team before I take this medication? They need to know if you have any of these conditions: history of irregular heartbeat liver  disease low blood counts, like low white cell, platelet, or red cell counts lung or breathing disease, like asthma tingling of the fingers or toes, or other nerve disorder an unusual or allergic reaction to paclitaxel, alcohol, polyoxyethylated castor oil, other chemotherapy, other medicines, foods, dyes, or preservatives pregnant or trying to get pregnant breast-feeding How should I use this medication? This drug is given as an infusion into a vein. It is administered in a hospital or clinic by a specially trained health care professional. Talk to your pediatrician regarding the use of this medicine in children. Special care may be needed. Overdosage: If you think you have taken too much of this medicine contact a poison control center or emergency room at once. NOTE: This medicine is only for you. Do not share this medicine with others. What if I miss a dose? It is important not to miss your dose. Call your doctor or health care professional if you are unable to keep an appointment. What may interact with this medication? Do not take this medicine with any of the following medications: live virus vaccines This medicine may also interact with the following medications: antiviral medicines for hepatitis, HIV or AIDS certain antibiotics like erythromycin and clarithromycin certain medicines for fungal infections like ketoconazole and itraconazole certain medicines for seizures like carbamazepine, phenobarbital, phenytoin gemfibrozil nefazodone rifampin St. John's wort This list may not describe all possible interactions. Give your health care provider a list of all the medicines, herbs, non-prescription drugs, or dietary supplements you use. Also tell them if you smoke, drink alcohol, or use illegal drugs. Some items may interact with your medicine. What should I watch for while using this medication? Your condition will be monitored carefully while you are receiving this medicine. You  will need important blood work done while you are taking this medicine. This medicine can cause serious allergic reactions. To reduce your risk you will need to take other medicine(s) before treatment with this medicine. If you experience allergic reactions like skin rash, itching or hives, swelling of the face, lips, or tongue, tell your doctor or health care professional right away. In some cases, you may be given additional medicines to help with side effects. Follow all directions for their use. This drug may make you feel generally unwell. This is not uncommon, as chemotherapy can affect healthy cells as well as cancer cells. Report any side effects. Continue your course of treatment even though you feel ill unless your doctor tells you to stop. Call your doctor or health care professional for advice if you get a fever, chills or sore throat, or other symptoms of a cold or flu. Do not treat yourself. This drug decreases your body's ability to fight infections. Try to avoid being around people who are sick. This medicine may increase your risk to bruise or bleed. Call your doctor or health care professional if you notice any unusual bleeding. Be careful brushing and flossing your teeth or using a toothpick because you may get an infection or bleed more easily. If you have any dental work done, tell your dentist you are receiving this medicine. Avoid taking products that contain aspirin, acetaminophen, ibuprofen, naproxen, or ketoprofen unless instructed by your doctor.  These medicines may hide a fever. Do not become pregnant while taking this medicine. Women should inform their doctor if they wish to become pregnant or think they might be pregnant. There is a potential for serious side effects to an unborn child. Talk to your health care professional or pharmacist for more information. Do not breast-feed an infant while taking this medicine. Men are advised not to father a child while receiving this  medicine. This product may contain alcohol. Ask your pharmacist or healthcare provider if this medicine contains alcohol. Be sure to tell all healthcare providers you are taking this medicine. Certain medicines, like metronidazole and disulfiram, can cause an unpleasant reaction when taken with alcohol. The reaction includes flushing, headache, nausea, vomiting, sweating, and increased thirst. The reaction can last from 30 minutes to several hours. What side effects may I notice from receiving this medication? Side effects that you should report to your doctor or health care professional as soon as possible: allergic reactions like skin rash, itching or hives, swelling of the face, lips, or tongue breathing problems changes in vision fast, irregular heartbeat high or low blood pressure mouth sores pain, tingling, numbness in the hands or feet signs of decreased platelets or bleeding - bruising, pinpoint red spots on the skin, black, tarry stools, blood in the urine signs of decreased red blood cells - unusually weak or tired, feeling faint or lightheaded, falls signs of infection - fever or chills, cough, sore throat, pain or difficulty passing urine signs and symptoms of liver injury like dark yellow or brown urine; general ill feeling or flu-like symptoms; light-colored stools; loss of appetite; nausea; right upper belly pain; unusually weak or tired; yellowing of the eyes or skin swelling of the ankles, feet, hands unusually slow heartbeat Side effects that usually do not require medical attention (report to your doctor or health care professional if they continue or are bothersome): diarrhea hair loss loss of appetite muscle or joint pain nausea, vomiting pain, redness, or irritation at site where injected tiredness This list may not describe all possible side effects. Call your doctor for medical advice about side effects. You may report side effects to FDA at 1-800-FDA-1088. Where  should I keep my medication? This drug is given in a hospital or clinic and will not be stored at home. NOTE: This sheet is a summary. It may not cover all possible information. If you have questions about this medicine, talk to your doctor, pharmacist, or health care provider.  2022 Elsevier/Gold Standard (2019-08-13 13:37:23)     Carboplatin injection What is this medication? CARBOPLATIN (KAR boe pla tin) is a chemotherapy drug. It targets fast dividing cells, like cancer cells, and causes these cells to die. This medicine is used to treat ovarian cancer and many other cancers. This medicine may be used for other purposes; ask your health care provider or pharmacist if you have questions. COMMON BRAND NAME(S): Paraplatin What should I tell my care team before I take this medication? They need to know if you have any of these conditions: blood disorders hearing problems kidney disease recent or ongoing radiation therapy an unusual or allergic reaction to carboplatin, cisplatin, other chemotherapy, other medicines, foods, dyes, or preservatives pregnant or trying to get pregnant breast-feeding How should I use this medication? This drug is usually given as an infusion into a vein. It is administered in a hospital or clinic by a specially trained health care professional. Talk to your pediatrician regarding the use of this medicine  in children. Special care may be needed. Overdosage: If you think you have taken too much of this medicine contact a poison control center or emergency room at once. NOTE: This medicine is only for you. Do not share this medicine with others. What if I miss a dose? It is important not to miss a dose. Call your doctor or health care professional if you are unable to keep an appointment. What may interact with this medication? medicines for seizures medicines to increase blood counts like filgrastim, pegfilgrastim, sargramostim some antibiotics like amikacin,  gentamicin, neomycin, streptomycin, tobramycin vaccines Talk to your doctor or health care professional before taking any of these medicines: acetaminophen aspirin ibuprofen ketoprofen naproxen This list may not describe all possible interactions. Give your health care provider a list of all the medicines, herbs, non-prescription drugs, or dietary supplements you use. Also tell them if you smoke, drink alcohol, or use illegal drugs. Some items may interact with your medicine. What should I watch for while using this medication? Your condition will be monitored carefully while you are receiving this medicine. You will need important blood work done while you are taking this medicine. This drug may make you feel generally unwell. This is not uncommon, as chemotherapy can affect healthy cells as well as cancer cells. Report any side effects. Continue your course of treatment even though you feel ill unless your doctor tells you to stop. In some cases, you may be given additional medicines to help with side effects. Follow all directions for their use. Call your doctor or health care professional for advice if you get a fever, chills or sore throat, or other symptoms of a cold or flu. Do not treat yourself. This drug decreases your body's ability to fight infections. Try to avoid being around people who are sick. This medicine may increase your risk to bruise or bleed. Call your doctor or health care professional if you notice any unusual bleeding. Be careful brushing and flossing your teeth or using a toothpick because you may get an infection or bleed more easily. If you have any dental work done, tell your dentist you are receiving this medicine. Avoid taking products that contain aspirin, acetaminophen, ibuprofen, naproxen, or ketoprofen unless instructed by your doctor. These medicines may hide a fever. Do not become pregnant while taking this medicine. Women should inform their doctor if they wish  to become pregnant or think they might be pregnant. There is a potential for serious side effects to an unborn child. Talk to your health care professional or pharmacist for more information. Do not breast-feed an infant while taking this medicine. What side effects may I notice from receiving this medication? Side effects that you should report to your doctor or health care professional as soon as possible: allergic reactions like skin rash, itching or hives, swelling of the face, lips, or tongue signs of infection - fever or chills, cough, sore throat, pain or difficulty passing urine signs of decreased platelets or bleeding - bruising, pinpoint red spots on the skin, black, tarry stools, nosebleeds signs of decreased red blood cells - unusually weak or tired, fainting spells, lightheadedness breathing problems changes in hearing changes in vision chest pain high blood pressure low blood counts - This drug may decrease the number of white blood cells, red blood cells and platelets. You may be at increased risk for infections and bleeding. nausea and vomiting pain, swelling, redness or irritation at the injection site pain, tingling, numbness in the hands or  feet problems with balance, talking, walking trouble passing urine or change in the amount of urine Side effects that usually do not require medical attention (report to your doctor or health care professional if they continue or are bothersome): hair loss loss of appetite metallic taste in the mouth or changes in taste This list may not describe all possible side effects. Call your doctor for medical advice about side effects. You may report side effects to FDA at 1-800-FDA-1088. Where should I keep my medication? This drug is given in a hospital or clinic and will not be stored at home. NOTE: This sheet is a summary. It may not cover all possible information. If you have questions about this medicine, talk to your doctor, pharmacist,  or health care provider.  2022 Elsevier/Gold Standard (2007-12-17 14:38:05)

## 2021-06-16 NOTE — Progress Notes (Signed)
SLP Cancellation Note  Patient Details Name: Derek Clarke MRN: [id number] DOB: [birth year]   Cancelled treatment:       Reason Eval/Treat Not Completed:  (chart reviewed; consulted NSG/MD). Pt was scheduled for a MBSS today after speaking to him and w/ MD late yesterday PM re: needing objective swallowing assessment d/t concern for pharyngeal phase dysphagia and aspiration. MD agreeable; pt reluctant but agreeable yesterday afternoon.  However, this morning, pt left room for his Chemotherapy tx at the [facility] and will be out of the room for ~5 hours per NSG -- MBSS had been scheduled for Noon.  Today's MBSS is canceled and rescheduled for tomorrow. NSG and MD updated. ST services will f/u tomorrow for MBSS. Recommend continue current dysphagia diet w/ oral care; aspiration precautions.      Orinda Kenner, MS, CCC-SLP Speech Language Pathologist Rehab Services [phone] [facility] 06/16/2021, 11:43 AM

## 2021-06-16 NOTE — Progress Notes (Signed)
1154: Pt continues to report feeling at baseline. No changes or concerns at this time.   1211: Pt noted to have increased difficulty swallowing while eating (tomato soup).  Pt states that this is normal when he eats.  B/P 141/91. Dr. Candie Echevaria made aware. Per Dr. Grayland Ormond proceed with Taxol and monitor closely.  1230: Dr. Candie Echevaria at chairside, Pt noted to have increased difficulty speaking, swallowing and taking deep breathes. Pt reports that he does feel like it is harder to take a deep breath, but that this happens at times and is not new. Pt reports that he did not receive AM dose of Mucinex due to being transported to Cancer center prior to taking it. Pt reports that he can tell that the secretions are thickening and making it harder to breath/swallow. Per Dr. Grayland Ormond give 600 MG PO Guaifenesin 12 hr tablet. Continue with treatment and monitor.  Pharmacy made aware of MD orders.  Pt stable at this time.   1350: Pt appears at baseline in comparison to arriving to clinic. Pt reports symptoms have improved. Pt reports the continued feeling of something "in my throat", although it has improved since finishing lunch pt continues to state that it is baseline. VSS and no s/s of distress noted at this time.   1504: VSS stable, pt continues to reports feeling at baseline. NO s/s of distress noted.  1605: VSS, pt reports feeling at baseline. Pt states I still feel as if "I have something in my throat. It has felt worse in the past". Pt reports this is baseline and not new since starting treatment. Dr. Grayland Ormond aware. Per Dr. Grayland Ormond okay to d/c to inpatient.  Report given to Margaret Pyle RN  1625: Pt stable at discharge Via wheelchair. No s/s of distress noted. Pt denies any concerns.

## 2021-06-16 NOTE — Progress Notes (Signed)
PROGRESS NOTE    Derek Clarke  JKK:[id number] DOB: Feb 23, 1956 DOA: 06/13/2021 PCP: Pcp, No   Assessment & Plan:   Principal Problem:   Acute on chronic respiratory failure with hypoxia (HCC) Active Problems:   COPD with acute exacerbation (HCC)   SOB (shortness of breath)   Elevated troponin   Dysphagia   GAD (generalized anxiety disorder)  Acute on chronic hypoxic respiratory failure: likely multifactorial including COPD exacerbation, lung cancer & possible postobstructive pneumonia. Continue on supplemental and wean back to baseline as tolerated. On 4L [place] chronically at home.  Continue on IV abxs, steroids, bronchodilators and encourage incentive spirometry   Possible postobstructive pneumonia: continue on IV levaquin, steroids & bronchodilators  Chronic diastolic CHF: appears compensated currently. Will monitor I/Os.   Adenocarcinoma of the lung: stage IV. Starting chemo today as per onco   Dysphagia: likely secondary to lung cancer. Continue on PPI. MBSS was canceled today as pt went for chemo and MBSS was rescheduled for tomorrow.    Generalized anxiety disorder: severity unknown. Ativan prn    Obesity: BMI 32.5. Complicates overall care & prognosis   DVT prophylaxis: lovenox  Code Status: full  Family Communication:  Disposition Plan: depends on PT/OT recs (not consulted yet)   Level of care: Progressive Cardiac  Status is: Inpatient  Remains inpatient appropriate because:Ongoing diagnostic testing needed not appropriate for outpatient work up, IV treatments appropriate due to intensity of illness or inability to take PO, and Inpatient level of care appropriate due to severity of illness  Dispo:  Patient From: Home  Planned Disposition: Home  Medically stable for discharge: No       Consultants:  onco  Procedures:   Antimicrobials: levaquin    Subjective: Pt c/o malaise   Objective: Vitals:   06/15/21 2017 06/15/21 2345 06/16/21 0418 06/16/21  0545  BP: 123/80 102/72 99/72   Pulse: 89 78 93   Resp: 18 20 20    Temp: (!) 97.5 F (36.4 C) 97.8 F (36.6 C) 97.9 F (36.6 C)   TempSrc: Oral Oral Oral   SpO2: 97% 97% 96%   Weight:    94.2 kg  Height:        Intake/Output Summary (Last 24 hours) at 06/16/2021 0732 Last data filed at 06/15/2021 2131 Gross per 24 hour  Intake 240 ml  Output --  Net 240 ml   Filed Weights   06/13/21 1733 06/15/21 1652 06/16/21 0545  Weight: 100 kg 93.8 kg 94.2 kg    Examination:  General exam: Appears comfortable  Respiratory system: decreased breath sounds b/l  Cardiovascular system: S1/S2+. No gallops or rubs  Gastrointestinal system: Abd is soft, NT, ND & hypoactive bowel sounds  Central nervous system: Alert and oriented. Moves all extremities  Psychiatry: Judgement and insight appear normal. Flat mood and affect     Data Reviewed: I have personally reviewed following labs and imaging studies  CBC: Recent Labs  Lab 06/13/21 1750 06/14/21 0420 06/15/21 0434 06/16/21 0405  WBC 15.9* 10.8* 16.2* 15.7*  NEUTROABS  --  10.2*  --   --   HGB 15.4 14.7 14.8 14.9  HCT 45.3 43.6 44.9 43.6  MCV 84.7 81.8 83.1 84.0  PLT 266 268 232 716   Basic Metabolic Panel: Recent Labs  Lab 06/13/21 1750 06/13/21 2347 06/14/21 0420 06/15/21 0434 06/16/21 0405  NA 135  --  134* 136 135  K 4.1  --  3.9 3.9 3.6  CL 92*  --  92* 91*  92*  CO2 31  --  31 35* 33*  GLUCOSE 106*  --  145* 127* 118*  BUN 19  --  21 25* 23  CREATININE 0.81  --  0.65 0.66 0.74  CALCIUM 9.5  --  9.4 9.3 9.5  MG  --  2.2 2.2  --   --   PHOS  --   --  4.4  --   --    GFR: Estimated Creatinine Clearance: 105.7 mL/min (by C-G formula based on SCr of 0.74 mg/dL). Liver Function Tests: Recent Labs  Lab 06/14/21 0420  AST 23  ALT 26  ALKPHOS 111  BILITOT 0.9  PROT 6.7  ALBUMIN 3.3*   No results for input(s): LIPASE, AMYLASE in the last 168 hours. No results for input(s): AMMONIA in the last 168  hours. Coagulation Profile: No results for input(s): INR, PROTIME in the last 168 hours. Cardiac Enzymes: No results for input(s): CKTOTAL, CKMB, CKMBINDEX, TROPONINI in the last 168 hours. BNP (last 3 results) No results for input(s): PROBNP in the last 8760 hours. HbA1C: No results for input(s): HGBA1C in the last 72 hours. CBG: Recent Labs  Lab 06/13/21 1347  GLUCAP 122*   Lipid Profile: No results for input(s): CHOL, HDL, LDLCALC, TRIG, CHOLHDL, LDLDIRECT in the last 72 hours. Thyroid Function Tests: No results for input(s): TSH, T4TOTAL, FREET4, T3FREE, THYROIDAB in the last 72 hours. Anemia Panel: No results for input(s): VITAMINB12, FOLATE, FERRITIN, TIBC, IRON, RETICCTPCT in the last 72 hours. Sepsis Labs: Recent Labs  Lab 06/13/21 1750 06/13/21 2347  PROCALCITON  --  <0.10  LATICACIDVEN 1.7 2.2*    Recent Results (from the past 240 hour(s))  Resp Panel by RT-PCR (Flu A&B, Covid) Nasopharyngeal Swab     Status: None   Collection Time: 06/07/21 11:21 AM   Specimen: Nasopharyngeal Swab; Nasopharyngeal(NP) swabs in vial transport medium  Result Value Ref Range Status   SARS Coronavirus 2 by RT PCR NEGATIVE NEGATIVE Final    Comment: (NOTE) SARS-CoV-2 target nucleic acids are NOT DETECTED.  The SARS-CoV-2 RNA is generally detectable in upper respiratory specimens during the acute phase of infection. The lowest concentration of SARS-CoV-2 viral copies this assay can detect is 138 copies/mL. A negative result does not preclude SARS-Cov-2 infection and should not be used as the sole basis for treatment or other patient management decisions. A negative result may occur with  improper specimen collection/handling, submission of specimen other than nasopharyngeal swab, presence of viral mutation(s) within the areas targeted by this assay, and inadequate number of viral copies(<138 copies/mL). A negative result must be combined with clinical observations, patient  history, and epidemiological information. The expected result is Negative.  Fact Sheet for Patients:  EntrepreneurPulse.com.au  Fact Sheet for Healthcare Providers:  IncredibleEmployment.be  This test is no t yet approved or cleared by the [place] FDA and  has been authorized for detection and/or diagnosis of SARS-CoV-2 by FDA under an Emergency Use Authorization (EUA). This EUA will remain  in effect (meaning this test can be used) for the duration of the COVID-19 declaration under Section 564(b)(1) of the Act, 21 U.S.C.section 360bbb-3(b)(1), unless the authorization is terminated  or revoked sooner.       Influenza A by PCR NEGATIVE NEGATIVE Final   Influenza B by PCR NEGATIVE NEGATIVE Final    Comment: (NOTE) The Xpert Xpress SARS-CoV-2/FLU/RSV plus assay is intended as an aid in the diagnosis of influenza from Nasopharyngeal swab specimens and should not be used  as a sole basis for treatment. Nasal washings and aspirates are unacceptable for Xpert Xpress SARS-CoV-2/FLU/RSV testing.  Fact Sheet for Patients: EntrepreneurPulse.com.au  Fact Sheet for Healthcare Providers: IncredibleEmployment.be  This test is not yet approved or cleared by the [place] FDA and has been authorized for detection and/or diagnosis of SARS-CoV-2 by FDA under an Emergency Use Authorization (EUA). This EUA will remain in effect (meaning this test can be used) for the duration of the COVID-19 declaration under Section 564(b)(1) of the Act, 21 U.S.C. section 360bbb-3(b)(1), unless the authorization is terminated or revoked.  Performed at [facility], [address] [place], [place] [address]   Blood culture (routine x 2)     Status: None   Collection Time: 06/07/21  1:01 PM   Specimen: BLOOD  Result Value Ref Range Status   Specimen Description BLOOD LEFT ANTECUBITAL  Final   Special Requests    Final    BOTTLES DRAWN AEROBIC AND ANAEROBIC Blood Culture results may not be optimal due to an inadequate volume of blood received in culture bottles   Culture   Final    NO GROWTH 5 DAYS Performed at [facility], [address] [place], [place] [address]    Report Status 06/12/2021 FINAL  Final  Blood culture (routine x 2)     Status: None   Collection Time: 06/07/21  1:21 PM   Specimen: BLOOD  Result Value Ref Range Status   Specimen Description BLOOD BLOOD LEFT HAND  Final   Special Requests   Final    BOTTLES DRAWN AEROBIC ONLY Blood Culture results may not be optimal due to an inadequate volume of blood received in culture bottles   Culture   Final    NO GROWTH 5 DAYS Performed at [facility], [address] [place], [place] [address]    Report Status 06/12/2021 FINAL  Final  Resp Panel by RT-PCR (Flu A&B, Covid) Nasopharyngeal Swab     Status: None   Collection Time: 06/13/21  8:27 PM   Specimen: Nasopharyngeal Swab; Nasopharyngeal(NP) swabs in vial transport medium  Result Value Ref Range Status   SARS Coronavirus 2 by RT PCR NEGATIVE NEGATIVE Final    Comment: (NOTE) SARS-CoV-2 target nucleic acids are NOT DETECTED.  The SARS-CoV-2 RNA is generally detectable in upper respiratory specimens during the acute phase of infection. The lowest concentration of SARS-CoV-2 viral copies this assay can detect is 138 copies/mL. A negative result does not preclude SARS-Cov-2 infection and should not be used as the sole basis for treatment or other patient management decisions. A negative result may occur with  improper specimen collection/handling, submission of specimen other than nasopharyngeal swab, presence of viral mutation(s) within the areas targeted by this assay, and inadequate number of viral copies(<138 copies/mL). A negative result must be combined with clinical observations, patient history, and epidemiological information. The expected result is  Negative.  Fact Sheet for Patients:  EntrepreneurPulse.com.au  Fact Sheet for Healthcare Providers:  IncredibleEmployment.be  This test is no t yet approved or cleared by the [place] FDA and  has been authorized for detection and/or diagnosis of SARS-CoV-2 by FDA under an Emergency Use Authorization (EUA). This EUA will remain  in effect (meaning this test can be used) for the duration of the COVID-19 declaration under Section 564(b)(1) of the Act, 21 U.S.C.section 360bbb-3(b)(1), unless the authorization is terminated  or revoked sooner.       Influenza A by PCR NEGATIVE NEGATIVE Final  Influenza B by PCR NEGATIVE NEGATIVE Final    Comment: (NOTE) The Xpert Xpress SARS-CoV-2/FLU/RSV plus assay is intended as an aid in the diagnosis of influenza from Nasopharyngeal swab specimens and should not be used as a sole basis for treatment. Nasal washings and aspirates are unacceptable for Xpert Xpress SARS-CoV-2/FLU/RSV testing.  Fact Sheet for Patients: EntrepreneurPulse.com.au  Fact Sheet for Healthcare Providers: IncredibleEmployment.be  This test is not yet approved or cleared by the [place] FDA and has been authorized for detection and/or diagnosis of SARS-CoV-2 by FDA under an Emergency Use Authorization (EUA). This EUA will remain in effect (meaning this test can be used) for the duration of the COVID-19 declaration under Section 564(b)(1) of the Act, 21 U.S.C. section 360bbb-3(b)(1), unless the authorization is terminated or revoked.  Performed at [facility], [address] [place], [place] [address]          Radiology Studies: [employer] Chest [place] 1 View  Result Date: 06/14/2021 CLINICAL DATA:  Dyspnea EXAM: PORTABLE CHEST 1 VIEW COMPARISON:  06/13/2021 FINDINGS: Cardiac shadow is stable. Right chest wall port is again seen and stable. Volume loss in the left hemithorax is  noted with diffuse increased airspace opacity stable from the previous day. Mild right basilar opacity is noted stable in appearance as well. No new focal abnormality is noted. IMPRESSION: Stable bilateral airspace opacities similar to that seen on recent CT and plain film examination. Electronically Signed   By: Inez Catalina M.D.   On: 06/14/2021 19:39        Scheduled Meds:  acetylcysteine  3 mL Nebulization Once   aspirin EC  325 mg Oral Once   Chlorhexidine Gluconate Cloth  6 each Topical Daily   enoxaparin (LOVENOX) injection  40 mg Subcutaneous Q24H   feeding supplement (NEPRO CARB STEADY)  237 mL Oral TID BM   guaiFENesin  600 mg Oral BID   ipratropium-albuterol  3 mL Nebulization Q6H   methylPREDNISolone (SOLU-MEDROL) injection  80 mg Intravenous Q12H   Continuous Infusions:  levofloxacin (LEVAQUIN) IV 750 mg (06/15/21 2131)     LOS: 3 days    Time spent: 30 mins     Wyvonnia Dusky, MD [facility] Pager [phone]  If 7PM-7AM, please contact night-coverage 06/16/2021, 7:32 AM

## 2021-06-16 NOTE — Progress Notes (Signed)
Initial Nutrition Assessment  DOCUMENTATION CODES:   Not applicable  INTERVENTION:   Nepro Shake po TID, each supplement provides 425 kcal and 19 grams protein  Vital Cuisine TID, each supplement provides 520kcal and 22g of protein.   Magic cup TID with meals, each supplement provides 290 kcal and 9 grams of protein  MVI po daily   Recommend consideration of G-tube placement  If G-tube placed, recommend:  Osmolite 1.5- Give 6 cartons daily- Start with 1/2 carton 6 times daily and advance slowly as tolerated. Flush with 43ml of water before and after each feed.   Pro-Source 3ml TID via tube, provides 40kcal and 11g of protein per serving   Regimen provides 2250kcal/day, 122g/day protein and 1816ml/day free water   NUTRITION DIAGNOSIS:   Inadequate oral intake related to dysphagia as evidenced by NPO status.  GOAL:   Patient will meet greater than or equal to 90% of their needs  MONITOR:   Diet advancement, Labs, Weight trends, Skin, I & O's  REASON FOR ASSESSMENT:   Malnutrition Screening Tool    ASSESSMENT:   65 y.o. 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  Pt unavailable at time of RD visit as pt has been receiving chemotherapy. Spoke with pt's brother at bedside. Brother reports that initially, pt was eating fairly well and that he was enjoying being able to eat the foods that he liked but he reports that patient has now gotten to a point where he is aspirating and unable to get down most foods. Pt has mainly been living off of Ensure and Boost at home. Per chart, pt is down 26lbs(11%) over the past 1-2 months; this is severe weight  loss. It appears pt's UBW is ~246lbs. Pt seen by SLP and placed on a dysphagia 1/nectar thick diet; pt very unhappy about the pureed diet and the nectar thick supplements as he reports that they are too thick and gag him. Pt does not really like the Nepro; pt is getting Magic Cups and Vital Cuisine on his meal trays. Pt scheduled for MBSS tomorrow. 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 Spoke with MD who feels as though it could potentially take weeks to months for tumor shrinkage. It will be hard for patient to be able to meet his estimated needs via oral intake. At this point, would recommend G-tube placement and nutrition support to help pt meet his estimated needs. Pt is at high refeed risk. Will plan to follow up with patient to obtain history and exam and to speak with patient regarding G-tube.   Medications reviewed and include: acetylcysteine, aspirin, lovenox, solu-medrol  Labs reviewed: K 3.6 wnl P 4.4 wnl, Mg 2.2 wnl- 9/20 Wbc- 15.7(H)  NUTRITION - FOCUSED PHYSICAL EXAM: Unable to perform at this time   Diet Order:   Diet Order             DIET - DYS 1 Room service appropriate? Yes with Assist; Fluid consistency: Nectar Thick  Diet effective now  EDUCATION NEEDS:   Not appropriate for education at this time  Skin:  Skin Assessment: Reviewed RN Assessment  Last BM:  9/18  Height:   Ht Readings from Last 1 Encounters:  06/15/21 5\' 9"  (1.753 m)    Weight:   Wt Readings from Last 1 Encounters:  06/16/21 94.2 kg    Ideal Body Weight:  72.7 kg  BMI:  Body mass index is 30.66 kg/m.  Estimated Nutritional Needs:   Kcal:  2100-2400kcal/day  Protein:  105-120g/day  Fluid:  2.2-2.5L/day  Koleen Distance MS, RD, LDN Please refer to [facility] for RD and/or RD on-call/weekend/after hours pager

## 2021-06-16 NOTE — Progress Notes (Signed)
Per MD ok to proceed without waiting for UProtine results. Will obtain later. Ok to update dose based on current weight.

## 2021-06-16 NOTE — Progress Notes (Signed)
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  INTERVAL HISTORY: Patient evaluated in the cancer center while receiving his first infusion of carboplatinum and Taxol.  He continues to have significant shortness of breath, but reports it has improved.  He has chronic weakness.  He denies any pain.  REVIEW OF SYSTEMS:   Review of Systems  Constitutional:  Positive for malaise/fatigue. Negative for fever and weight loss.  HENT:  Negative for sore throat.   Respiratory:  Positive for cough and shortness of breath. Negative for hemoptysis and stridor.   Cardiovascular: Negative.  Negative for chest pain and leg swelling.  Gastrointestinal: Negative.  Negative for abdominal pain and nausea.  Genitourinary: Negative.  Negative for dysuria.  Musculoskeletal: Negative.  Negative for back pain.  Skin: Negative.  Negative for rash.  Neurological:  Positive for weakness. Negative for dizziness, focal weakness and headaches.  Psychiatric/Behavioral: Negative.  The patient is not nervous/anxious.    As per HPI. Otherwise, a complete review of systems is negative.  PAST MEDICAL HISTORY: Past Medical History:  Diagnosis Date   Adenocarcinoma ([place])    lung; stage 4 with mets to liver   Chronic respiratory failure with hypoxia (HCC)    baseline 4 L [place]   COPD (chronic obstructive pulmonary disease) (HCC)    GAD (generalized anxiety disorder)     PAST SURGICAL HISTORY: Past Surgical History:  Procedure Laterality Date   IR IMAGING GUIDED PORT INSERTION  06/03/2021    FAMILY HISTORY: Family History  Problem Relation Age of Onset   Lung disease Mother    Dementia Father     ADVANCED DIRECTIVES (Y/N):  '@ADVDIR' @  HEALTH  MAINTENANCE: Social History   Tobacco Use   Smoking status: Former    Packs/day: 1.00    Years: 30.00    Pack years: 30.00    Types: Cigarettes    Quit date: 03/26/2021    Years since quitting: 0.2   Smokeless tobacco: Never  Substance Use Topics   Alcohol use: Yes   Drug use: Yes    Types: Marijuana    Comment: quit approx. 5 years ago     Colonoscopy:  PAP:  Bone density:  Lipid panel:  No Known Allergies  Current Facility-Administered Medications  Medication Dose Route Frequency Provider Last Rate Last Admin   acetaminophen (TYLENOL) tablet 650 mg  650 mg Oral Q6H PRN Howerter, Justin B, DO       Or   acetaminophen (TYLENOL) suppository 650 mg  650 mg Rectal Q6H PRN Howerter, Justin B, DO       acetylcysteine (MUCOMYST) 20 % nebulizer / oral solution 3 mL  3 mL Nebulization Once Bonnielee Haff, MD       albuterol (PROVENTIL) (2.5 MG/3ML) 0.083% nebulizer solution 2.5 mg  2.5 mg Nebulization Q4H PRN Howerter, Justin B, DO   2.5 mg at 06/14/21 1729   aspirin EC tablet 325 mg  325 mg Oral Once Howerter, Justin B, DO       Chlorhexidine Gluconate Cloth 2 % PADS 6 each  6 each Topical Daily Wyvonnia Dusky, MD       diphenhydrAMINE (BENADRYL) capsule 25 mg  25 mg Oral Q6H PRN Bonnielee Haff, MD   25 mg at  06/15/21 2124   enoxaparin (LOVENOX) injection 40 mg  40 mg Subcutaneous Q24H Bonnielee Haff, MD   40 mg at 06/16/21 0859   feeding supplement (NEPRO CARB STEADY) liquid 237 mL  237 mL Oral TID BM Bonnielee Haff, MD   237 mL at 06/15/21 2124   guaiFENesin (MUCINEX) 12 hr tablet 600 mg  600 mg Oral BID Bonnielee Haff, MD   600 mg at 06/15/21 2124   ipratropium-albuterol (DUONEB) 0.5-2.5 (3) MG/3ML nebulizer solution 3 mL  3 mL Nebulization Q6H Howerter, Justin B, DO   3 mL at 06/16/21 0756   levofloxacin (LEVAQUIN) IVPB 750 mg  750 mg Intravenous Q24H Howerter, Justin B, DO 100 mL/hr at 06/15/21 2131 750 mg at 06/15/21 2131   LORazepam (ATIVAN) injection 0.5 mg  0.5 mg  Intravenous Q4H PRN Howerter, Justin B, DO   0.5 mg at 06/14/21 1730   methylPREDNISolone sodium succinate (SOLU-MEDROL) 125 mg/2 mL injection 80 mg  80 mg Intravenous Q12H Howerter, Justin B, DO   80 mg at 06/15/21 2327   ondansetron (ZOFRAN) injection 4 mg  4 mg Intravenous Q6H PRN Howerter, Justin B, DO        OBJECTIVE: Vitals:   06/16/21 0418 06/16/21 0844  BP: 99/72 109/76  Pulse: 93 100  Resp: 20 (!) 28  Temp: 97.9 F (36.6 C) 97.8 F (36.6 C)  SpO2: 96% 96%     Body mass index is 30.66 kg/m.    ECOG FS:2 - Symptomatic, <50% confined to bed  General: Ill-appearing, no acute distress. Eyes: Pink conjunctiva, anicteric sclera. HEENT: Normocephalic, moist mucous membranes. Lungs: No audible wheezing or coughing. Heart: Regular rate and rhythm. Abdomen: Soft, nontender, no obvious distention. Musculoskeletal: No edema, cyanosis, or clubbing. Neuro: Alert, answering all questions appropriately. Cranial nerves grossly intact. Skin: No rashes or petechiae noted. Psych: Normal affect.   LAB RESULTS:  Lab Results  Component Value Date   NA 135 06/16/2021   K 3.6 06/16/2021   CL 92 (L) 06/16/2021   CO2 33 (H) 06/16/2021   GLUCOSE 118 (H) 06/16/2021   BUN 23 06/16/2021   CREATININE 0.74 06/16/2021   CALCIUM 9.5 06/16/2021   PROT 6.7 06/14/2021   ALBUMIN 3.3 (L) 06/14/2021   AST 23 06/14/2021   ALT 26 06/14/2021   ALKPHOS 111 06/14/2021   BILITOT 0.9 06/14/2021   GFRNONAA >60 06/16/2021    Lab Results  Component Value Date   WBC 15.7 (H) 06/16/2021   NEUTROABS 10.2 (H) 06/14/2021   HGB 14.9 06/16/2021   HCT 43.6 06/16/2021   MCV 84.0 06/16/2021   PLT 245 06/16/2021     STUDIES: CT Angio Chest PE W and/or Wo Contrast  Result Date: 06/07/2021 CLINICAL DATA:  Tachycardia, vomiting, metastatic lung cancer EXAM: CT ANGIOGRAPHY CHEST WITH CONTRAST TECHNIQUE: Multidetector CT imaging of the chest was performed using the standard protocol during bolus  administration of intravenous contrast. Multiplanar CT image reconstructions and MIPs were obtained to evaluate the vascular anatomy. CONTRAST:  28m OMNIPAQUE IOHEXOL 350 MG/ML SOLN COMPARISON:  06/08/2011, 05/15/2021, FINDINGS: Cardiovascular: This is a technically adequate evaluation of the pulmonary vasculature. No filling defects or pulmonary emboli. Stable left hilar soft tissue mass encasing the left upper and left lower pulmonary arteries. Stable trace pericardial effusion. The heart is not enlarged. No evidence of thoracic aortic aneurysm or dissection. Mild atherosclerosis of the aorta and coronary vessels. Mediastinum/Nodes: There is segmental wall thickening of the mid to distal thoracic esophagus. Confluent soft tissue within  the mediastinum and bilateral hila consistent with adenopathy, not grossly changed since prior study. An enlarged left supraclavicular lymph node on image 9/4 measures 13 mm in short axis, unchanged. Thyroid and trachea are unremarkable. Lungs/Pleura: There are trace bilateral pleural effusions. Since the prior exam, there is progressive near complete consolidation of the left upper lobe, which may reflect progressive disease and/or postobstructive change. There is diffuse bilateral bronchial wall thickening, greatest at the lung bases. Continued right middle lobe collapse with occlusion of the right middle lobe bronchus again noted. Upper Abdomen: There are stable bilateral adrenal nodules, measuring up to 2.2 cm on the right. Multiple enlarged lymph nodes are again seen within the central upper abdomen, largest measuring 1.7 cm just posterior to the gastric cardia. Multiple hypodensities are again seen within the liver consistent with metastatic disease, grossly unchanged since prior study. Musculoskeletal: Stable sclerotic focus within the left T10 transverse process and within the T9 vertebral body, compatible with bony metastases. No acute displaced fractures. Reconstructed  images demonstrate no additional findings. Review of the MIP images confirms the above findings. IMPRESSION: 1. No evidence of pulmonary embolus. 2. Persistent left hilar mass surrounding the left hilar vessels, with increasing near total consolidation of the left upper lobe since prior study. This may be progression of malignancy versus postobstructive change. 3. Continued coalescing lymphadenopathy throughout the mediastinum and bilateral hila. 4. Bilateral bronchial wall thickening, greatest at the lung bases. 5. Trace bilateral pleural effusions. 6. Segmental wall thickening of the mid to distal thoracic esophagus, which could be inflammatory or infectious. 7. Upper abdominal metastases with continued multiple liver lesions, bilateral adrenal nodularity, and upper abdominal adenopathy. Electronically Signed   By: Randa Ngo M.D.   On: 06/07/2021 15:12   MR BRAIN W WO CONTRAST  Result Date: 05/18/2021 CLINICAL DATA:  Lung cancer diagnosis.  Staging. EXAM: MRI HEAD WITHOUT AND WITH CONTRAST TECHNIQUE: Multiplanar, multiecho pulse sequences of the brain and surrounding structures were obtained without and with intravenous contrast. CONTRAST:  71m GADAVIST GADOBUTROL 1 MMOL/ML IV SOLN COMPARISON:  None. FINDINGS: Brain: Study suffers from motion degradation. Diffusion imaging does not show any acute or subacute infarction or other cause of restricted diffusion. No evidence of chronic small vessel disease or old cortical infarction. Very few punctate foci of T2 and FLAIR signal within the white matter. No sign of mass lesion, hemorrhage, hydrocephalus or extra-axial collection. No abnormal contrast enhancement occurs. Vascular: Major vessels at the base of the brain show flow. Skull and upper cervical spine: Negative Sinuses/Orbits: Clear/normal Other: None IMPRESSION: No evidence of regional metastatic disease. Sensitivity is slightly limited by motion degradation and utilization of fast scanning protocol.  I do not see any suspicious findings. Electronically Signed   By: MNelson ChimesM.D.   On: 05/18/2021 13:44   MR LIVER W WO CONTRAST  Result Date: 05/18/2021 CLINICAL DATA:  Inpatient. Acute respiratory failure with infiltrative left upper lobe soft tissue and bilateral mediastinal adenopathy suspicious on chest CT for lung malignancy. Indeterminate liver and bilateral adrenal lesions on CT abdomen study. EXAM: MRI ABDOMEN WITHOUT AND WITH CONTRAST TECHNIQUE: Multiplanar multisequence MR imaging of the abdomen was performed both before and after the administration of intravenous contrast. CONTRAST:  1108mGADAVIST GADOBUTROL 1 MMOL/ML IV SOLN COMPARISON:  05/15/2021 CT abdomen/pelvis and chest CT angiogram. FINDINGS: Substantially motion degraded exam, limiting assessment. Lower chest: Small right and trace left dependent pleural effusions. Geographic consolidation in the visualized lingula and right middle lobe. Hepatobiliary: Normal liver size  and configuration. No hepatic steatosis. There are several (at least 5) mildly T2 hyperintense T1 hypointense hypoenhancing liver masses scattered throughout the liver with restricted diffusion, compatible with metastatic disease, largest 1.5 x 1.3 cm in the segment 6 right liver (series 4/image 20), 1.3 x 1.1 cm in the anterior segment 4 left liver (series 4/image 18) and 1.1 x 1.1 cm in the segment 8 right liver (series 4/image 9). Normal gallbladder with no cholelithiasis. No biliary ductal dilatation. Common bile duct diameter 4 mm. No choledocholithiasis. No biliary masses, strictures or beading. Pancreas: No pancreatic mass or duct dilation.  No pancreas divisum. Spleen: Normal size. No mass. Adrenals/Urinary Tract: Enhancing right adrenal 2.5 x 1.7 cm nodule (series 3/image 18) demonstrates no significant loss of signal intensity on chemical shift imaging and is indeterminate. Similar enhancing left adrenal 1.5 x 1.3 cm nodule (series 3/image 23), too small to  characterize for intralesional lipid on the motion degraded chemical shift images. No hydronephrosis. Scattered bilateral simple renal cysts, largest 1.6 cm in the anterior upper left kidney. T2 hypointense T1 hyperintense 1.1 cm renal cortical lesion in the lower right kidney (series 4/image 33) without compelling enhancement on the motion degraded postcontrast sequences, compatible with hemorrhagic/proteinaceous Bosniak category 2 renal cyst. No overtly suspicious renal masses. Stomach/Bowel: Normal non-distended stomach. Visualized small and large bowel is normal caliber, with no bowel wall thickening. Vascular/Lymphatic: Normal caliber abdominal aorta. Patent portal, splenic, hepatic and renal veins. A few mildly enlarged left para-aortic nodes, largest 1.2 cm short axis diameter (series 18/image 66). Enlarged 1.5 cm high left retroperitoneal node anterior to the left diaphragmatic crus (series 18/image 37). Other: No abdominal ascites or focal fluid collection. Musculoskeletal: Enhancing inferior T9 vertebral 2.8 cm and right T10 vertebral 1.1 cm bone lesions (series 17/image 19). Enhancing 2.1 cm medial right iliac bone lesion (series 17/image 19). IMPRESSION: 1. Limited motion degraded scan. 2. Several (at least 5) hypoenhancing liver masses scattered throughout the liver, with MRI features compatible with metastatic disease. Percutaneous biopsy of the 1.5 cm segment 6 right liver or 1.3 cm anterior segment 4 left liver lesions could be considered. 3. Indeterminate enhancing bilateral adrenal nodules, measuring 2.5 cm on the right and 1.5 cm on the left. Metastatic disease is not excluded. 4. Left para-aortic and high left retroperitoneal lymphadenopathy suspicious for metastatic disease. 5. Enhancing T9 and T10 vertebral and medial right iliac bone lesions, suspicious for bone metastases. 6. Small right and trace left dependent pleural effusions. Geographic consolidation in the visualized lingula and right  middle lobe, nonspecific, please see recent chest CT angiogram study for details. Electronically Signed   By: Ilona Sorrel M.D.   On: 05/18/2021 16:39   NM PET Image Initial (PI) Skull Base To Thigh  Result Date: 06/14/2021 CLINICAL DATA:  Initial treatment strategy for lung nodule. Lymphadenopathy with liver lesions. EXAM: NUCLEAR MEDICINE PET SKULL BASE TO THIGH TECHNIQUE: 12.0 mCi F-18 FDG was injected intravenously. Full-ring PET imaging was performed from the skull base to thigh after the radiotracer. CT data was obtained and used for attenuation correction and anatomic localization. Fasting blood glucose: 122 mg/dl COMPARISON:  CTA Chest 06/07/2021 FINDINGS: Mediastinal blood pool activity: SUV max 0.8 Liver activity: SUV max NA NECK: 12 mm left cervical node on 12/8 is hypermetabolic with SUV max = 4.3. Small hypermetabolic lymph nodes evident right lower neck. Bilateral supraclavicular hypermetabolic lymph nodes including 11 mm left supraclavicular node on 60/3 with SUV max = 7.4. Markedly Markedly hypermetabolic left thyroid nodule  measures 1.4 cm on image 60/3 with SUV max = 21.7. Incidental CT findings: none CHEST: Hypermetabolic mediastinal lymphadenopathy evident. Index subcarinal measures 18 mm short axis on 01/02/3 with SUV max = 7.0 hypermetabolic lymph nodes are seen in both hilar regions into the prevascular space. 16 mm prevascular node on 87/3 demonstrates SUV max = 7.0. Marked wall thickening noted in the mid esophagus, at approximately the level of the carina. Although somewhat difficult to assess given the adjacent hypermetabolic lymphadenopathy, there does appear to be marked hypermetabolism in the wall of this abnormal appearing esophagus with SUV max = 7.7. Areas of collapse/consolidation in the left upper lobe, right middle lobe and infrahilar left lower lobe show substantial hypermetabolism. There is diffuse interstitial opacity in both lungs with innumerable tiny bilateral pulmonary  nodules. Incidental CT findings: Trace pericardial effusion. Right Port-A-Cath tip is positioned in the distal SVC. Coronary artery calcification is evident. Mild atherosclerotic calcification is noted in the wall of the thoracic aorta. ABDOMEN/PELVIS: Multiple hypermetabolic liver lesions evident. 18 mm subcapsular lesion anterior left liver on 150/3 demonstrates SUV max = 7.7. Hypermetabolic gastrohepatic ligament lymph nodes measure up to 14 mm on 150/3 with SUV max = 6.6 12 mm left para-aortic node on 756/4 is hypermetabolic with SUV max = 4.4. 2.4 cm right adrenal nodule shows low attenuation, suggesting adenoma with associated hypermetabolism SUV max = 5.3. Incidental CT findings: Prostate gland is enlarged. Scattered diverticuli noted left colon without diverticulitis. There is mild atherosclerotic calcification of the abdominal aorta without aneurysm. SKELETON: Scattered hypermetabolic bone metastases evident. 12 mm posterior right iliac sclerotic lesion on 02/20 8/3 shows SUV max = 5.6. Sacral metastases demonstrate SUV max = 5.3 subtle 13 mm left femoral neck lesion demonstrates SUV max = 5.3. Incidental CT findings: none IMPRESSION: 1. Similar right middle and left upper lobe collapse with associated marked hypermetabolism. There is diffuse interstitial and micro nodularity in both lungs consistent with metastatic disease. 2. Hypermetabolic metastatic lymphadenopathy in the neck, mediastinum, hilar regions, gastrohepatic ligament, and retroperitoneum. 3. Marked hypermetabolism identified in the mid esophagus with associated esophageal wall thickening. This may be related to secondary involvement although primary esophageal neoplasm not excluded. 4. Hypermetabolic liver and bone metastases. 5. Hypermetabolic right adrenal nodule. This is felt to be likely an adenoma although metastatic involvement is certainly possible. 6. Hypermetabolic left thyroid nodule. Recommend thyroid US and biopsy. (Ref: J Am Coll  Radiol. 2015 Feb;12(2): 143-50). 7.  Aortic Atherosclerois (ICD10-170.0) Electronically Signed   By: Misty Stanley M.D.   On: 06/14/2021 12:27   DG Chest Port 1 View  Result Date: 06/14/2021 CLINICAL DATA:  Dyspnea EXAM: PORTABLE CHEST 1 VIEW COMPARISON:  06/13/2021 FINDINGS: Cardiac shadow is stable. Right chest wall port is again seen and stable. Volume loss in the left hemithorax is noted with diffuse increased airspace opacity stable from the previous day. Mild right basilar opacity is noted stable in appearance as well. No new focal abnormality is noted. IMPRESSION: Stable bilateral airspace opacities similar to that seen on recent CT and plain film examination. Electronically Signed   By: Inez Catalina M.D.   On: 06/14/2021 19:39   DG Chest Port 1 View  Result Date: 06/13/2021 CLINICAL DATA:  Shortness of breath.  Recent pneumonia. EXAM: PORTABLE CHEST 1 VIEW COMPARISON:  Radiograph and chest CT 06/07/2021. FINDINGS: Right chest port remains in place. Similar volume loss in the left hemithorax with diffusely increased opacity, opacity throughout the left upper lobe with seen on recent CT.  Opacity at the right lung base corresponds to right middle lobe collapse. Background bronchial thickening. Trace pleural effusions on CT are not well demonstrated by radiograph. No pneumothorax or significant change from prior imaging. Heart size is grossly stable. IMPRESSION: 1. No significant change from prior imaging. 2. Volume loss in the left hemithorax with diffusely increased opacity throughout the left upper lung zone, upper lobe consolidation seen on recent CT. 3. Right lung base opacity corresponds to right middle lobe collapse. 4. Background bronchial thickening. Electronically Signed   By: Keith Rake M.D.   On: 06/13/2021 18:36   DG Chest Portable 1 View  Result Date: 06/07/2021 CLINICAL DATA:  Cough, shortness of breath. EXAM: PORTABLE CHEST 1 VIEW COMPARISON:  May 14, 2021. FINDINGS: Stable  cardiomediastinal silhouette. Interval placement of right internal jugular Port-A-Cath with distal tip in expected position of the SVC. No pneumothorax is noted. Left upper lobe consolidation is noted concerning for pneumonia or atelectasis. Probable central pulmonary vascular congestion is noted. Bony thorax is unremarkable. IMPRESSION: Left upper lobe opacity is noted concerning for pneumonia or atelectasis. Electronically Signed   By: Marijo Conception M.D.   On: 06/07/2021 12:13   ECHOCARDIOGRAM COMPLETE  Result Date: 06/08/2021    ECHOCARDIOGRAM REPORT   Patient Name:   BIRDIE BEVERIDGE Date of Exam: 06/08/2021 Medical Rec #:  [id number]      Height:       69.0 in Accession #:    [id number]     Weight:       220.7 lb Date of Birth:  November 16, 1955      BSA:          2.154 m Patient Age:    89 years       BP:           130/81 mmHg Patient Gender: M              HR:           108 bpm. Exam Location:  [facility] Procedure: 2D Echo, Cardiac Doppler, Color Doppler and Strain Analysis Indications:    CHF-acute diastolic Q73.41  History:        Patient has no prior history of Echocardiogram examinations.                 COPD.  Sonographer:    Sherrie Sport Referring Phys: [id number] Ivor Costa Diagnosing      Kate Sable MD Phys:  Sonographer Comments: Suboptimal apical window. Global longitudinal strain was attempted. IMPRESSIONS  1. Left ventricular ejection fraction, by estimation, is 65 to 70%. The left ventricle has normal function. The left ventricle has no regional wall motion abnormalities. Left ventricular diastolic parameters are consistent with Grade I diastolic dysfunction (impaired relaxation).  2. Right ventricular systolic function is normal. The right ventricular size is mildly enlarged.  3. A small pericardial effusion is present.  4. The mitral valve is normal in structure. No evidence of mitral valve regurgitation.  5. The aortic valve is tricuspid. Aortic valve regurgitation is not visualized. FINDINGS  Left  Ventricle: Left ventricular ejection fraction, by estimation, is 65 to 70%. The left ventricle has normal function. The left ventricle has no regional wall motion abnormalities. Global longitudinal strain performed but not reported based on interpreter judgement due to suboptimal tracking. 3D left ventricular ejection fraction analysis performed but not reported based on interpreter judgement due to suboptimal quality. The left ventricular internal cavity size was normal in size. There is no left  ventricular hypertrophy. Left ventricular diastolic parameters are consistent with Grade I diastolic dysfunction (impaired relaxation). Right Ventricle: The right ventricular size is mildly enlarged. No increase in right ventricular wall thickness. Right ventricular systolic function is normal. Left Atrium: Left atrial size was normal in size. Right Atrium: Right atrial size was normal in size. Pericardium: A small pericardial effusion is present. Mitral Valve: The mitral valve is normal in structure. No evidence of mitral valve regurgitation. Tricuspid Valve: The tricuspid valve is grossly normal. Tricuspid valve regurgitation is trivial. Aortic Valve: The aortic valve is tricuspid. Aortic valve regurgitation is not visualized. Aortic valve mean gradient measures 6.0 mmHg. Aortic valve peak gradient measures 10.5 mmHg. Aortic valve area, by VTI measures 2.89 cm. Pulmonic Valve: The pulmonic valve was not well visualized. Pulmonic valve regurgitation is not visualized. Aorta: The aortic root is normal in size and structure. IAS/Shunts: No atrial level shunt detected by color flow Doppler.  LEFT VENTRICLE PLAX 2D LVIDd:         4.80 cm  Diastology LVIDs:         2.60 cm  LV e' medial:   1.96 cm/s LV PW:         1.30 cm  LV E/e' medial: 38.7 LV IVS:        1.20 cm LVOT diam:     2.10 cm LV SV:         76 LV SV Index:   35 LVOT Area:     3.46 cm 3D Volume EF:                         3D EF:        52 %                          LV EDV:       96 ml                         LV ESV:       46 ml                         LV SV:        50 ml RIGHT VENTRICLE RV S prime:     14.00 cm/s TAPSE (M-mode): 5.7 cm LEFT ATRIUM             Index       RIGHT ATRIUM           Index LA diam:        3.30 cm 1.53 cm/m  RA Area:     21.00 cm LA Vol (A2C):   64.2 ml 29.80 ml/m RA Volume:   55.60 ml  25.81 ml/m LA Vol (A4C):   31.2 ml 14.48 ml/m LA Biplane Vol: 45.4 ml 21.07 ml/m  AORTIC VALVE                    PULMONIC VALVE AV Area (Vmax):    2.39 cm     PV Vmax:       0.73 m/s AV Area (Vmean):   2.78 cm     PV Peak grad:  2.1 mmHg AV Area (VTI):     2.89 cm AV Vmax:           162.00 cm/s AV Vmean:  108.000 cm/s AV VTI:            0.261 m AV Peak Grad:      10.5 mmHg AV Mean Grad:      6.0 mmHg LVOT Vmax:         112.00 cm/s LVOT Vmean:        86.800 cm/s LVOT VTI:          0.218 m LVOT/AV VTI ratio: 0.84  AORTA Ao Root diam: 3.30 cm MITRAL VALVE                TRICUSPID VALVE MV Area (PHT): 3.95 cm     TR Peak grad:   30.0 mmHg MV Decel Time: 192 msec     TR Vmax:        274.00 cm/s MV E velocity: 75.80 cm/s MV A velocity: 119.00 cm/s  SHUNTS MV E/A ratio:  0.64         Systemic VTI:  0.22 m                             Systemic Diam: 2.10 cm Kate Sable MD Electronically signed by Kate Sable MD Signature Date/Time: 06/08/2021/3:10:53 PM    Final    [place] CORE BIOPSY (LYMPH NODES)  Result Date: 05/20/2021 INDICATION: Possible lung carcinoma by prior imaging with vague liver lesions and also enlarged left supraclavicular lymph node by CT. Due to oxygen requirement and poor respiratory status, biopsy of the left supraclavicular lymph node is performed without sedation as tolerance a liver biopsy currently with sedation would be borderline and potentially high risk. EXAM: ULTRASOUND GUIDED CORE BIOPSY OF LEFT SUPRACLAVICULAR LYMPH NODE MEDICATIONS: None. ANESTHESIA/SEDATION: None PROCEDURE: The procedure, risks, benefits, and  alternatives were explained to the patient. Questions regarding the procedure were encouraged and answered. The patient understands and consents to the procedure. A time-out was performed prior to initiating the procedure. The left neck was prepped with chlorhexidine in a sterile fashion, and a sterile drape was applied covering the operative field. A sterile gown and sterile gloves were used for the procedure. Local anesthesia was provided with 1% Lidocaine. After localizing an enlarged left supraclavicular lymph node, an 18 gauge core biopsy device was utilized in obtaining 4 separate core biopsy samples. Material was submitted in formalin. Additional ultrasound was performed. COMPLICATIONS: None immediate. FINDINGS: Several left supraclavicular and lower cervical lymph nodes are identified. The largest measures approximately 1.7 x 1.3 x 1.4 cm by ultrasound. Solid core biopsy samples were obtained from this lymph node. IMPRESSION: Ultrasound-guided core biopsy performed enlarged left supraclavicular lymph node. Electronically Signed   By: Aletta Edouard M.D.   On: 05/20/2021 11:02   IR IMAGING GUIDED PORT INSERTION  Result Date: 06/03/2021 INDICATION: Metastatic lung cancer.  Chemotherapy. EXAM: IMPLANTED PORT A CATH PLACEMENT WITH ULTRASOUND AND FLUOROSCOPIC GUIDANCE MEDICATIONS: None ANESTHESIA/SEDATION: Moderate (conscious) sedation was employed during this procedure. A total of Versed 0 mg and Fentanyl 25 mcg was administered intravenously. Moderate Sedation Time: 20 minutes. The patient's level of consciousness and vital signs were monitored continuously by radiology nursing throughout the procedure under my direct supervision. FLUOROSCOPY TIME:  0 minutes, 24 seconds (4.5 mGy) COMPLICATIONS: None immediate. PROCEDURE: The procedure, risks, benefits, and alternatives were explained to the patient. Questions regarding the procedure were encouraged and answered. The patient understands and consents to the  procedure. The neck and chest were prepped with chlorhexidine in a sterile fashion, and a  sterile drape was applied covering the operative field. Maximum barrier sterile technique with sterile gowns and gloves were used for the procedure. A timeout was performed prior to the initiation of the procedure. Local anesthesia was provided with 1% lidocaine with epinephrine. After creating a small venotomy incision, a micropuncture kit was utilized to access the internal jugular vein under direct, real-time ultrasound guidance. Ultrasound image documentation was performed. The microwire was kinked to measure appropriate catheter length. A subcutaneous port pocket was then created along the upper chest wall utilizing a combination of sharp and blunt dissection. The pocket was irrigated with sterile saline. A single lumen ISP power injectable port was chosen for placement. The 8 Fr catheter was tunneled from the port pocket site to the venotomy incision. The port was placed in the pocket. The external catheter was trimmed to appropriate length. At the venotomy, an 8 Fr peel-away sheath was placed over a guidewire under fluoroscopic guidance. The catheter was then placed through the sheath and the sheath was removed. Final catheter positioning was confirmed and documented with a fluoroscopic spot radiograph. The port was accessed with a Huber needle, aspirated and flushed with heparinized saline. The port pocket incision was closed with interrupted 3-0 Vicryl suture then Dermabond was applied, including at the venotomy incision. Dressings were placed. The patient tolerated the procedure well without immediate post procedural complication. IMPRESSION: Successful placement of a right internal jugular approach power injectable Port-A-Cath. The catheter is ready for immediate use. Michaelle Birks, MD [facility] Rad[facility] Electronically Signed   By: Michaelle Birks M.D.   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  Patient received his first dose of carboplatinum and Taxol today.  Return to the clinic tomorrow to receive Avastin.  Appreciate palliative care input. Shortness of breath/COPD exacerbation: Improving. Patient requires approximately 4 L nasal cannula at baseline.  Continue steroids and current antibiotics.   Leukocytosis:Likely reactive.  Monitor. Difficulty swallowing: Appreciate ENT and speech pathology input. Disposition: Will arrange follow-up in the cancer center for laboratory work and evaluation in 1 week.  Patient's next chemotherapy will occur 3 weeks from today.  Okay to discharge from an oncology standpoint, although his pulmonary status may be limiting.   Lloyd Huger, MD   06/16/2021 7:00 PM

## 2021-06-16 NOTE — Progress Notes (Signed)
Pt returned from chemo treatment at this time, alert and oriented x 4. Assisted pt with dinner order. No complaints of pain. Will continue to monitor.

## 2021-06-17 ENCOUNTER — Encounter: Payer: Self-pay | Admitting: Oncology

## 2021-06-17 ENCOUNTER — Inpatient Hospital Stay: Payer: Non-veteran care

## 2021-06-17 ENCOUNTER — Telehealth: Payer: Self-pay

## 2021-06-17 VITALS — BP 123/83 | HR 94 | Temp 96.6°F | Resp 20

## 2021-06-17 DIAGNOSIS — C3492 Malignant neoplasm of unspecified part of left bronchus or lung: Secondary | ICD-10-CM

## 2021-06-17 LAB — CBC
HCT: 42.6 % (ref 39.0–52.0)
Hemoglobin: 14.3 g/dL (ref 13.0–17.0)
MCH: 27.6 pg (ref 26.0–34.0)
MCHC: 33.6 g/dL (ref 30.0–36.0)
MCV: 82.2 fL (ref 80.0–100.0)
Platelets: 212 10*3/uL (ref 150–400)
RBC: 5.18 MIL/uL (ref 4.22–5.81)
RDW: 12.8 % (ref 11.5–15.5)
WBC: 11.6 10*3/uL — ABNORMAL HIGH (ref 4.0–10.5)
nRBC: 0 % (ref 0.0–0.2)

## 2021-06-17 LAB — BASIC METABOLIC PANEL
Anion gap: 10 (ref 5–15)
BUN: 19 mg/dL (ref 8–23)
CO2: 30 mmol/L (ref 22–32)
Calcium: 8.9 mg/dL (ref 8.9–10.3)
Chloride: 94 mmol/L — ABNORMAL LOW (ref 98–111)
Creatinine, Ser: 0.61 mg/dL (ref 0.61–1.24)
GFR, Estimated: >60 mL/min (ref >60)
Glucose, Bld: 117 mg/dL — ABNORMAL HIGH (ref 70–99)
Potassium: 3.4 mmol/L — ABNORMAL LOW (ref 3.5–5.1)
Sodium: 134 mmol/L — ABNORMAL LOW (ref 135–145)

## 2021-06-17 MED ORDER — DOCUSATE SODIUM 100 MG PO CAPS
200.0000 mg | ORAL_CAPSULE | Freq: Two times a day (BID) | ORAL | Status: DC
  Administered 2021-06-17 – 2021-06-30 (×24): 200 mg via ORAL
  Filled 2021-06-17 (×26): qty 2

## 2021-06-17 MED ORDER — SODIUM CHLORIDE 0.9 % IV SOLN
Freq: Once | INTRAVENOUS | Status: AC
  Filled 2021-06-17: qty 250

## 2021-06-17 MED ORDER — POTASSIUM CHLORIDE CRYS ER 20 MEQ PO TBCR
20.0000 meq | EXTENDED_RELEASE_TABLET | Freq: Once | ORAL | Status: AC
  Administered 2021-06-17: 20 meq via ORAL
  Filled 2021-06-17: qty 1

## 2021-06-17 MED ORDER — SODIUM CHLORIDE 0.9% FLUSH
10.0000 mL | INTRAVENOUS | Status: DC | PRN
  Administered 2021-06-17: 10 mL
  Filled 2021-06-17: qty 10

## 2021-06-17 MED ORDER — METHYLPREDNISOLONE SODIUM SUCC 125 MG IJ SOLR
60.0000 mg | Freq: Two times a day (BID) | INTRAMUSCULAR | Status: DC
  Administered 2021-06-17 – 2021-06-20 (×6): 60 mg via INTRAVENOUS
  Filled 2021-06-17 (×6): qty 2

## 2021-06-17 MED ORDER — HEPARIN SOD (PORK) LOCK FLUSH 100 UNIT/ML IV SOLN
INTRAVENOUS | Status: AC
  Administered 2021-06-17: 500 [IU]
  Filled 2021-06-17: qty 5

## 2021-06-17 MED ORDER — SODIUM CHLORIDE 0.9 % IV SOLN
15.0000 mg/kg | Freq: Once | INTRAVENOUS | Status: AC
  Administered 2021-06-17: 1400 mg via INTRAVENOUS
  Filled 2021-06-17: qty 48

## 2021-06-17 MED ORDER — HEPARIN SOD (PORK) LOCK FLUSH 100 UNIT/ML IV SOLN
500.0000 [IU] | Freq: Once | INTRAVENOUS | Status: AC | PRN
  Filled 2021-06-17: qty 5

## 2021-06-17 NOTE — Patient Instructions (Signed)
[facility] ONCOL[facility]   Discharge Instructions: Thank you for choosing [facility] to provide your oncology and hematology care.  If you have a lab appointment with the [facility], please go directly to the [facility] and check in at the registration area.  Wear comfortable clothing and clothing appropriate for easy access to any Portacath or PICC line.   We strive to give you quality time with your provider. You may need to reschedule your appointment if you arrive late (15 or more minutes).  Arriving late affects you and other patients whose appointments are after yours.  Also, if you miss three or more appointments without notifying the office, you may be dismissed from the clinic at the provider's discretion.      For prescription refill requests, have your pharmacy contact our office and allow 72 hours for refills to be completed.    Today you received the following chemotherapy and/or immunotherapy agents: Avastin.      To help prevent nausea and vomiting after your treatment, we encourage you to take your nausea medication as directed.  BELOW ARE SYMPTOMS THAT SHOULD BE REPORTED IMMEDIATELY: *FEVER GREATER THAN 100.4 F (38 C) OR HIGHER *CHILLS OR SWEATING *NAUSEA AND VOMITING THAT IS NOT CONTROLLED WITH YOUR NAUSEA MEDICATION *UNUSUAL SHORTNESS OF BREATH *UNUSUAL BRUISING OR BLEEDING *URINARY PROBLEMS (pain or burning when urinating, or frequent urination) *BOWEL PROBLEMS (unusual diarrhea, constipation, pain near the anus) TENDERNESS IN MOUTH AND THROAT WITH OR WITHOUT PRESENCE OF ULCERS (sore throat, sores in mouth, or a toothache) UNUSUAL RASH, SWELLING OR PAIN  UNUSUAL VAGINAL DISCHARGE OR ITCHING   Items with * indicate a potential emergency and should be followed up as soon as possible or go to the Emergency Department if any problems should occur.  Please show the CHEMOTHERAPY ALERT CARD or IMMUNOTHERAPY ALERT CARD at check-in  to the Emergency Department and triage nurse.  Should you have questions after your visit or need to cancel or reschedule your appointment, please contact [facility]  [phone] and follow the prompts.  Office hours are 8:00 a.m. to 4:30 p.m. Monday - Friday. Please note that voicemails left after 4:00 p.m. may not be returned until the following business day.  We are closed weekends and major holidays. You have access to a nurse at all times for urgent questions. Please call the main number to the clinic [phone] and follow the prompts.  For any non-urgent questions, you may also contact your provider using MyChart. We now offer e-Visits for anyone 55 and older to request care online for non-urgent symptoms. For details visit [email or web address].   Also download the MyChart app! Go to the app store, search "MyChart", open the app, select [facility], and log in with your MyChart username and password.  Due to Covid, a mask is required upon entering the hospital/clinic. If you do not have a mask, one will be given to you upon arrival. For doctor visits, patients may have 1 support person aged 80 or older with them. For treatment visits, patients cannot have anyone with them due to current Covid guidelines and our immunocompromised population.

## 2021-06-17 NOTE — Progress Notes (Addendum)
SLP Cancellation Note  Patient Details Name: Derek Clarke MRN: [id number] DOB: 1956-08-18   Cancelled treatment:       Reason Eval/Treat Not Completed: Patient at procedure or test/unavailable. Per report and Radiology Tech, pt is unable to come down for the MBSS d/t pt at Chemotherapy tx at the [facility]. Unsure if time of return to room. MBSS to be rescheduled. Recommend continue current diet until completion of MBSS.      Orinda Kenner, MS, CCC-SLP Speech Language Pathologist Rehab Services [phone] [facility] 06/17/2021, 4:23 PM

## 2021-06-17 NOTE — Progress Notes (Signed)
PROGRESS NOTE    Derek Clarke  KGM:[id number] DOB: 1955/11/02 DOA: 06/13/2021 PCP: Pcp, No   Assessment & Plan:   Principal Problem:   Acute on chronic respiratory failure with hypoxia (HCC) Active Problems:   COPD with acute exacerbation (HCC)   SOB (shortness of breath)   Elevated troponin   Dysphagia   GAD (generalized anxiety disorder)  Acute on chronic hypoxic respiratory failure: likely multifactorial including COPD exacerbation, lung cancer & possible postobstructive pneumonia. Continue on supplemental oxygen and wean back to baseline as tolerated. On 4L [place] chronically at home. Continue on IV abxs, steroids, bronchodilators. Encourage incentive spirometry    Possible postobstructive pneumonia: continue on IV levaquin, bronchodilators and start steroid taper   Chronic diastolic CHF: appears compensated currently. Will monitor I/Os.   Adenocarcinoma of the lung: stage IV. Started on chemo 9/22 as per onco   Dysphagia: likely secondary to lung cancer. Continue on PPI. MBSS today   Generalized anxiety disorder: severity unknown. Ativan prn    Obesity: BMI 32.5. Complicates overall care & prognosis   DVT prophylaxis: lovenox  Code Status: full  Family Communication:  Disposition Plan: depends on PT/OT recs   Level of care: Progressive Cardiac  Status is: Inpatient  Remains inpatient appropriate because:Ongoing diagnostic testing needed not appropriate for outpatient work up, IV treatments appropriate due to intensity of illness or inability to take PO, and Inpatient level of care appropriate due to severity of illness  Dispo:  Patient From: Home  Planned Disposition: Home  Medically stable for discharge: No       Consultants:  onco  Procedures:   Antimicrobials: levaquin    Subjective: Pt c/o malaise   Objective: Vitals:   06/16/21 2319 06/17/21 0150 06/17/21 0439 06/17/21 0500  BP: 122/87  114/79   Pulse: 89  93   Resp: 18  20   Temp: 97.8 F  (36.6 C)  97.7 F (36.5 C)   TempSrc:      SpO2: 97% 97% 100%   Weight:    94.9 kg  Height:        Intake/Output Summary (Last 24 hours) at 06/17/2021 0725 Last data filed at 06/16/2021 2000 Gross per 24 hour  Intake 1375.82 ml  Output --  Net 1375.82 ml   Filed Weights   06/15/21 1652 06/16/21 0545 06/17/21 0500  Weight: 93.8 kg 94.2 kg 94.9 kg    Examination:  General exam: Appears comfortable Respiratory system: diminished breath sounds b/l. No rales  Cardiovascular system: S1 &S2+. No gallops or rubs Gastrointestinal system: Abd is soft, NT, ND & normal bowel sounds  Central nervous system: Alert and oriented. Moves all extremities  Psychiatry: Judgement and insight appear normal. Flat mood and affect     Data Reviewed: I have personally reviewed following labs and imaging studies  CBC: Recent Labs  Lab 06/13/21 1750 06/14/21 0420 06/15/21 0434 06/16/21 0405 06/17/21 0420  WBC 15.9* 10.8* 16.2* 15.7* 11.6*  NEUTROABS  --  10.2*  --   --   --   HGB 15.4 14.7 14.8 14.9 14.3  HCT 45.3 43.6 44.9 43.6 42.6  MCV 84.7 81.8 83.1 84.0 82.2  PLT 266 268 232 245 644   Basic Metabolic Panel: Recent Labs  Lab 06/13/21 1750 06/13/21 2347 06/14/21 0420 06/15/21 0434 06/16/21 0405 06/17/21 0420  NA 135  --  134* 136 135 134*  K 4.1  --  3.9 3.9 3.6 3.4*  CL 92*  --  92* 91* 92* 94*  CO2 31  --  31 35* 33* 30  GLUCOSE 106*  --  145* 127* 118* 117*  BUN 19  --  21 25* 23 19  CREATININE 0.81  --  0.65 0.66 0.74 0.61  CALCIUM 9.5  --  9.4 9.3 9.5 8.9  MG  --  2.2 2.2  --   --   --   PHOS  --   --  4.4  --   --   --    GFR: Estimated Creatinine Clearance: 106.1 mL/min (by C-G formula based on SCr of 0.61 mg/dL). Liver Function Tests: Recent Labs  Lab 06/14/21 0420  AST 23  ALT 26  ALKPHOS 111  BILITOT 0.9  PROT 6.7  ALBUMIN 3.3*   No results for input(s): LIPASE, AMYLASE in the last 168 hours. No results for input(s): AMMONIA in the last 168  hours. Coagulation Profile: No results for input(s): INR, PROTIME in the last 168 hours. Cardiac Enzymes: No results for input(s): CKTOTAL, CKMB, CKMBINDEX, TROPONINI in the last 168 hours. BNP (last 3 results) No results for input(s): PROBNP in the last 8760 hours. HbA1C: No results for input(s): HGBA1C in the last 72 hours. CBG: Recent Labs  Lab 06/13/21 1347  GLUCAP 122*   Lipid Profile: No results for input(s): CHOL, HDL, LDLCALC, TRIG, CHOLHDL, LDLDIRECT in the last 72 hours. Thyroid Function Tests: No results for input(s): TSH, T4TOTAL, FREET4, T3FREE, THYROIDAB in the last 72 hours. Anemia Panel: No results for input(s): VITAMINB12, FOLATE, FERRITIN, TIBC, IRON, RETICCTPCT in the last 72 hours. Sepsis Labs: Recent Labs  Lab 06/13/21 1750 06/13/21 2347  PROCALCITON  --  <0.10  LATICACIDVEN 1.7 2.2*    Recent Results (from the past 240 hour(s))  Resp Panel by RT-PCR (Flu A&B, Covid) Nasopharyngeal Swab     Status: None   Collection Time: 06/07/21 11:21 AM   Specimen: Nasopharyngeal Swab; Nasopharyngeal(NP) swabs in vial transport medium  Result Value Ref Range Status   SARS Coronavirus 2 by RT PCR NEGATIVE NEGATIVE Final    Comment: (NOTE) SARS-CoV-2 target nucleic acids are NOT DETECTED.  The SARS-CoV-2 RNA is generally detectable in upper respiratory specimens during the acute phase of infection. The lowest concentration of SARS-CoV-2 viral copies this assay can detect is 138 copies/mL. A negative result does not preclude SARS-Cov-2 infection and should not be used as the sole basis for treatment or other patient management decisions. A negative result may occur with  improper specimen collection/handling, submission of specimen other than nasopharyngeal swab, presence of viral mutation(s) within the areas targeted by this assay, and inadequate number of viral copies(<138 copies/mL). A negative result must be combined with clinical observations, patient  history, and epidemiological information. The expected result is Negative.  Fact Sheet for Patients:  EntrepreneurPulse.com.au  Fact Sheet for Healthcare Providers:  IncredibleEmployment.be  This test is no t yet approved or cleared by the [place] FDA and  has been authorized for detection and/or diagnosis of SARS-CoV-2 by FDA under an Emergency Use Authorization (EUA). This EUA will remain  in effect (meaning this test can be used) for the duration of the COVID-19 declaration under Section 564(b)(1) of the Act, 21 U.S.C.section 360bbb-3(b)(1), unless the authorization is terminated  or revoked sooner.       Influenza A by PCR NEGATIVE NEGATIVE Final   Influenza B by PCR NEGATIVE NEGATIVE Final    Comment: (NOTE) The Xpert Xpress SARS-CoV-2/FLU/RSV plus assay is intended as an aid in the diagnosis of influenza  from Nasopharyngeal swab specimens and should not be used as a sole basis for treatment. Nasal washings and aspirates are unacceptable for Xpert Xpress SARS-CoV-2/FLU/RSV testing.  Fact Sheet for Patients: EntrepreneurPulse.com.au  Fact Sheet for Healthcare Providers: IncredibleEmployment.be  This test is not yet approved or cleared by the [place] FDA and has been authorized for detection and/or diagnosis of SARS-CoV-2 by FDA under an Emergency Use Authorization (EUA). This EUA will remain in effect (meaning this test can be used) for the duration of the COVID-19 declaration under Section 564(b)(1) of the Act, 21 U.S.C. section 360bbb-3(b)(1), unless the authorization is terminated or revoked.  Performed at [facility], [address] [place], [place] [address]   Blood culture (routine x 2)     Status: None   Collection Time: 06/07/21  1:01 PM   Specimen: BLOOD  Result Value Ref Range Status   Specimen Description BLOOD LEFT ANTECUBITAL  Final   Special Requests    Final    BOTTLES DRAWN AEROBIC AND ANAEROBIC Blood Culture results may not be optimal due to an inadequate volume of blood received in culture bottles   Culture   Final    NO GROWTH 5 DAYS Performed at [facility], [address] [place], [place] [address]    Report Status 06/12/2021 FINAL  Final  Blood culture (routine x 2)     Status: None   Collection Time: 06/07/21  1:21 PM   Specimen: BLOOD  Result Value Ref Range Status   Specimen Description BLOOD BLOOD LEFT HAND  Final   Special Requests   Final    BOTTLES DRAWN AEROBIC ONLY Blood Culture results may not be optimal due to an inadequate volume of blood received in culture bottles   Culture   Final    NO GROWTH 5 DAYS Performed at [facility], [address] [place], [place] [address]    Report Status 06/12/2021 FINAL  Final  Resp Panel by RT-PCR (Flu A&B, Covid) Nasopharyngeal Swab     Status: None   Collection Time: 06/13/21  8:27 PM   Specimen: Nasopharyngeal Swab; Nasopharyngeal(NP) swabs in vial transport medium  Result Value Ref Range Status   SARS Coronavirus 2 by RT PCR NEGATIVE NEGATIVE Final    Comment: (NOTE) SARS-CoV-2 target nucleic acids are NOT DETECTED.  The SARS-CoV-2 RNA is generally detectable in upper respiratory specimens during the acute phase of infection. The lowest concentration of SARS-CoV-2 viral copies this assay can detect is 138 copies/mL. A negative result does not preclude SARS-Cov-2 infection and should not be used as the sole basis for treatment or other patient management decisions. A negative result may occur with  improper specimen collection/handling, submission of specimen other than nasopharyngeal swab, presence of viral mutation(s) within the areas targeted by this assay, and inadequate number of viral copies(<138 copies/mL). A negative result must be combined with clinical observations, patient history, and epidemiological information. The expected result is  Negative.  Fact Sheet for Patients:  EntrepreneurPulse.com.au  Fact Sheet for Healthcare Providers:  IncredibleEmployment.be  This test is no t yet approved or cleared by the [place] FDA and  has been authorized for detection and/or diagnosis of SARS-CoV-2 by FDA under an Emergency Use Authorization (EUA). This EUA will remain  in effect (meaning this test can be used) for the duration of the COVID-19 declaration under Section 564(b)(1) of the Act, 21 U.S.C.section 360bbb-3(b)(1), unless the authorization is terminated  or revoked sooner.  Influenza A by PCR NEGATIVE NEGATIVE Final   Influenza B by PCR NEGATIVE NEGATIVE Final    Comment: (NOTE) The Xpert Xpress SARS-CoV-2/FLU/RSV plus assay is intended as an aid in the diagnosis of influenza from Nasopharyngeal swab specimens and should not be used as a sole basis for treatment. Nasal washings and aspirates are unacceptable for Xpert Xpress SARS-CoV-2/FLU/RSV testing.  Fact Sheet for Patients: EntrepreneurPulse.com.au  Fact Sheet for Healthcare Providers: IncredibleEmployment.be  This test is not yet approved or cleared by the [place] FDA and has been authorized for detection and/or diagnosis of SARS-CoV-2 by FDA under an Emergency Use Authorization (EUA). This EUA will remain in effect (meaning this test can be used) for the duration of the COVID-19 declaration under Section 564(b)(1) of the Act, 21 U.S.C. section 360bbb-3(b)(1), unless the authorization is terminated or revoked.  Performed at [facility], [address] [place], [place] [address]          Radiology Studies: No results found.      Scheduled Meds:  acetylcysteine  3 mL Nebulization Once   aspirin EC  325 mg Oral Once   Chlorhexidine Gluconate Cloth  6 each Topical Daily   enoxaparin (LOVENOX) injection  40 mg Subcutaneous Q24H   feeding  supplement (NEPRO CARB STEADY)  237 mL Oral TID BM   guaiFENesin  600 mg Oral BID   ipratropium-albuterol  3 mL Nebulization Q6H   methylPREDNISolone (SOLU-MEDROL) injection  80 mg Intravenous Q12H   Continuous Infusions:  levofloxacin (LEVAQUIN) IV 750 mg (06/16/21 2104)     LOS: 4 days    Time spent: 25 mins     Wyvonnia Dusky, MD [facility] Pager [phone]  If 7PM-7AM, please contact night-coverage 06/17/2021, 7:25 AM

## 2021-06-17 NOTE — Telephone Encounter (Signed)
T/C to patient for follow up after receiving first chemo yesterday.   Patient was in infusion getting another drug.  Unable to hear patient but Radonna Ricker, infusion nurse said patient could not tell any difference in how he felt from yesterday.   Patient is currently an inpatient.   Will contact patient later to check on him.

## 2021-06-17 NOTE — Progress Notes (Signed)
1425- Patient arrived to cancer center from inpatient unit, 2A, via wheelchair (transported by tele-tracking transport staff). Patient and vital signs stable. Patient is here to receive Avastin treatment today.  1600- Patient tolerated Avastin treatment well. Patient and vital signs stable. Verbal hand-off report called to inpatient unit, 2A, Raquel Sarna Lelon Frohlich) Randol Kern, RN. Patient transported back to inpatient unit, 2A, via wheelchair by tele-tracking transport staff.

## 2021-06-18 LAB — BASIC METABOLIC PANEL
Anion gap: 10 (ref 5–15)
BUN: 19 mg/dL (ref 8–23)
CO2: 32 mmol/L (ref 22–32)
Calcium: 9 mg/dL (ref 8.9–10.3)
Chloride: 94 mmol/L — ABNORMAL LOW (ref 98–111)
Creatinine, Ser: 0.59 mg/dL — ABNORMAL LOW (ref 0.61–1.24)
GFR, Estimated: >60 mL/min (ref >60)
Glucose, Bld: 116 mg/dL — ABNORMAL HIGH (ref 70–99)
Potassium: 3.9 mmol/L (ref 3.5–5.1)
Sodium: 136 mmol/L (ref 135–145)

## 2021-06-18 LAB — CBC
HCT: 41.9 % (ref 39.0–52.0)
Hemoglobin: 14.5 g/dL (ref 13.0–17.0)
MCH: 28.9 pg (ref 26.0–34.0)
MCHC: 34.6 g/dL (ref 30.0–36.0)
MCV: 83.5 fL (ref 80.0–100.0)
Platelets: 165 10*3/uL (ref 150–400)
RBC: 5.02 MIL/uL (ref 4.22–5.81)
RDW: 12.8 % (ref 11.5–15.5)
WBC: 10.4 10*3/uL (ref 4.0–10.5)
nRBC: 0 % (ref 0.0–0.2)

## 2021-06-18 NOTE — Evaluation (Signed)
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 Note: pt with two recent admissions with similar presentation.   Clinical Impression  Pt is a 65 year old M admitted to hospital on 06/13/21 for acute on chronic respiratory failure with hypoxia. At baseline, pt is grossly Ind with all ADL's, IADL's, and ambulation; notes increased difficulty performing ADL's and IADL's due to SOB. Pt presents with +4 pitting edema of BLE, labored breathing, decreased activity tolerance, and decreased cardiopulmonary tolerance to activity, resulting in impaired functional mobility. Pt grossly Ind for transfers and gait in room, but provided supervision for safety. Pt with labored breathing throughout session, despite SpO2 at 97%. Pt educated extensively regarding edema management including: BLE elevation and ankle pumps. Pt verbalized understanding, but stating he cannot recline in chair due to orthopnea. When PT offered to prop pt up with pillows, he declined but noted that he "might later." Deficits limit the pt's ability to safely and independently perform ADL's, transfer, and ambulate. Pt will benefit from acute skilled PT services to address cardiopulmonary deficits. At this time, PT recommends referral to cardiac rehab to address cardiopulmonary deficits. Pt will also benefit from [facility] aide to assist with IADL's due to pt's decreased activity tolerance.        Recommendations for follow up therapy are one component of a multi-disciplinary discharge  planning process, led by the attending physician.  Recommendations may be updated based on patient status, additional functional criteria and insurance authorization.  Follow Up Recommendations  (Cardiac Rehab; [place] aide)    Equipment Recommendations  None recommended by PT    Recommendations for Other Services       Precautions / Restrictions Precautions Precautions: Fall Precaution Comments: Metastatic disease Restrictions Weight Bearing Restrictions: No      Mobility  Bed Mobility               General bed mobility comments: deferred (pt sitting in recliner beginning/end of session)    Transfers Overall transfer level: Independent Equipment used: None Transfers: Sit to/from Stand Sit to Stand: Independent         General transfer comment: increased time d/t increased O2 need  Ambulation/Gait Ambulation/Gait assistance: Independent Gait Distance (Feet): 30 Feet Assistive device: None Gait Pattern/deviations: Step-through pattern Gait velocity: mildly decreased   General Gait Details: steady ambulation      Balance Overall balance assessment: Independent           Pertinent Vitals/Pain Pain Assessment: No/denies pain Pain Score: 4  Pain Location: abdomen (c/o some bouts of constipation) Pain Descriptors / Indicators: Aching Pain Intervention(s): Limited activity within patient's tolerance;Monitored during session    Home Living Family/patient expects to be discharged to:: Private residence Living Arrangements: Alone Available Help at Discharge: Family;Other (Comment) (brother in [place]) Type of Home: Mobile home Home Access: Stairs to enter Entrance Stairs-Rails: Right;Left;Can reach both Entrance Stairs-Number of Steps: 3 Home Layout: One level Home Equipment: None      Prior Function Level of Independence: Independent (reports declining mobility in recent weeks since cancer dx)  Comments: (+) driving     Hand  Dominance   Dominant Hand: Right    Extremity/Trunk Assessment   Upper Extremity Assessment Upper Extremity Assessment: Defer to OT evaluation    Lower Extremity Assessment Lower Extremity Assessment: Overall WFL for tasks assessed    Cervical / Trunk Assessment Cervical / Trunk Assessment: Normal  Communication   Communication: No difficulties  Cognition Arousal/Alertness: Awake/alert Behavior During Therapy: WFL for tasks assessed/performed Overall Cognitive Status: Within Functional Limits for tasks assessed             General Comments: patient short of breath requiring increased time to communicate/respond to question. While he is oriented, there is some lack of insight into dx or generally lacking health literacy.      General Comments General comments (skin integrity, edema, etc.): +4 pitting edema of BLE    Exercises Other Exercises Other Exercises: Participates in transfers and gait without AD, grossly Ind. Limited by decreased activity tolerance due to mets CA dx. Other Exercises: Educated: PT role/POC, DC recommendations, safety with mobility, edema management. Verbalized understanding.   Assessment/Plan    PT Assessment Patient needs continued PT services  PT Problem List Decreased activity tolerance;Decreased mobility;Cardiopulmonary status limiting activity       PT Treatment Interventions Gait training;Stair training;Functional mobility training;Therapeutic activities;Therapeutic exercise;Patient/family education;Balance training;Neuromuscular re-education    PT Goals (Current goals can be found in the Care Plan section)  Acute Rehab PT Goals Patient Stated Goal: "To improve breathing" PT Goal Formulation: With patient Time For Goal Achievement: 07/02/21 Potential to Achieve Goals: Fair    Frequency Min 2X/week   Barriers to discharge Decreased caregiver support         AM-PAC PT "6 Clicks" Mobility  Outcome Measure Help needed turning from  your back to your side while in a flat bed without using bedrails?: None Help needed moving from lying on your back to sitting on the side of a flat bed without using bedrails?: None Help needed moving to and from a bed to a chair (including a wheelchair)?: None Help needed standing up from a chair using your arms (e.g., wheelchair or bedside chair)?: None Help needed to walk in hospital room?: A Little Help needed climbing 3-5 steps with a railing? : A Little 6 Click Score: 22    End of Session Equipment Utilized During Treatment: Gait belt;Oxygen Activity Tolerance: Patient tolerated treatment well;Patient limited by fatigue Patient left: in chair;with call bell/phone within reach Nurse Communication: Mobility status;Precautions;Other (comment) PT Visit Diagnosis: Difficulty in walking, not elsewhere classified (R26.2)    Time: 6222-9798 PT Time Calculation (min) (ACUTE ONLY): 16 min   Charges:   PT Evaluation $PT Eval Low Complexity: 1 Low         Herminio Commons, PT, DPT 3:42 PM,06/18/21

## 2021-06-18 NOTE — Evaluation (Addendum)
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 Note: pt with two recent admissions with similar presentation.   Clinical Impression   Pt seen for OT evaluation this date in setting of re-admission d/t SOB. Pt with 2 other recent adm in which this author evaluated patient. Pt's ROM, strength, transfers and mobility assessed again in context of pt recently starting cancer treatments to ensure pt is at baseline function. While he is still severely limited d/t O2 needs, he appears to be at his functional baseline. He does not require any assistance to perform ADLs or ADL mobility. He is limited with completing functional distance and IADLs d/t SOB. OT completes re-education regarding ECS. Pt with moderate familiarity from previous evaluations. No further acute OT needs at this time. Will complete order.      Recommendations for follow up therapy are one component of a multi-disciplinary discharge planning process, led by the attending physician.  Recommendations may be updated based on patient status, additional functional criteria and insurance authorization.   Follow Up Recommendations  No OT follow up;Supervision/Assistance - 24 hour    Equipment Recommendations  Tub/shower seat;3 in 1 bedside commode    Recommendations for Other Services       Precautions / Restrictions Precautions Precautions: Fall Precaution Comments: Metastatic disease Restrictions Weight Bearing Restrictions: No      Mobility Bed  Mobility               General bed mobility comments: deferred (pt sitting in recliner beginning/end of session)    Transfers Overall transfer level: Independent Equipment used: None                  Balance Overall balance assessment: Independent                                         ADL either performed or assessed with clinical judgement   ADL Overall ADL's : Modified independent;At baseline                                       General ADL Comments: increased time d/t low tolerance 2/2 SOB     Vision Baseline Vision/History: 1 Wears glasses Wears Glasses: At all times Patient Visual Report: No change from baseline       Perception     Praxis      Pertinent Vitals/Pain Pain Assessment: 0-10 Pain Score: 4  Pain Location: abdomen (c/o some bouts of constipation) Pain Descriptors / Indicators: Aching Pain Intervention(s): Limited activity within patient's tolerance;Monitored during session     Hand Dominance Right   Extremity/Trunk Assessment Upper Extremity Assessment Upper Extremity Assessment: Overall WFL for tasks assessed   Lower Extremity Assessment Lower Extremity Assessment: Overall WFL for tasks assessed   Cervical / Trunk Assessment Cervical / Trunk Assessment: Normal   Communication Communication Communication: No difficulties  Cognition Arousal/Alertness: Awake/alert Behavior During Therapy: WFL for tasks assessed/performed Overall Cognitive Status: Within Functional Limits for tasks assessed                                 General Comments: patient short of breath requiring increased time to communicate/respond to question. While he is oriented, there is some lack of insight into dx or generally lacking health literacy.   General Comments       Exercises Other Exercises Other Exercises: OT f/u re: ECS. Pt with familiarity from previous evaluations.   Shoulder Instructions       Home Living Family/patient expects to be discharged to:: Private residence Living Arrangements: Alone Available Help at Discharge: Family;Other (Comment) (brother in [place]) Type of Home: Mobile home Home Access: Stairs to enter Technical brewer of Steps: 3 Entrance Stairs-Rails: Right;Left;Can reach both Home Layout: One level     Bathroom Shower/Tub: Teacher, early years/pre: Standard     Home Equipment: None          Prior Functioning/Environment Level of Independence: Independent (reports declining mobility in recent weeks since cancer dx)        Comments: (+) driving        OT Problem List: Cardiopulmonary status limiting activity      OT Treatment/Interventions: Self-care/ADL training    OT Goals(Current goals can be found in the care plan section) Acute Rehab OT Goals Patient Stated Goal: "To improve breathing" OT Goal Formulation: All assessment and education complete, DC therapy  OT Frequency:     Barriers to D/C:            Co-evaluation              AM-PAC OT "6 Clicks" Daily Activity     Outcome Measure Help from another person eating meals?: None Help from another person taking care of personal grooming?: None Help from another person toileting, which includes using toliet, bedpan, or urinal?: None Help from another person bathing (including washing, rinsing, drying)?: None Help from another person to put on and taking off regular upper body clothing?: None Help from another person to put on and taking off regular lower body clothing?: None 6 Click Score: 24   End of Session Equipment Utilized During Treatment: Oxygen  Activity Tolerance: Patient tolerated treatment well Patient left: in chair;with call bell/phone within reach  OT Visit Diagnosis: Muscle weakness (generalized) (M62.81)                Time: 3220-2542 OT Time Calculation (min): 14 min Charges:  OT General Charges $OT Visit: 1 Visit OT  Evaluation $OT Eval Low Complexity: 1 Low  Gerrianne Scale, MS, OTR/L ascom [phone] 06/18/21, 3:16 PM

## 2021-06-18 NOTE — Progress Notes (Signed)
PROGRESS NOTE    Derek Clarke  YKD:[id number] DOB: 1956/05/03 DOA: 06/13/2021 PCP: Pcp, No   Assessment & Plan:   Principal Problem:   Acute on chronic respiratory failure with hypoxia (HCC) Active Problems:   COPD with acute exacerbation (HCC)   SOB (shortness of breath)   Elevated troponin   Dysphagia   GAD (generalized anxiety disorder)  Acute on chronic hypoxic respiratory failure: likely multifactorial including COPD exacerbation, lung cancer & possible postobstructive pneumonia. Continue on supplemental oxygen and back to baseline of 4L [place] today. Completed abx course. Continue on steroids, bronchodilators. Encourage incentive spirometry    Possible postobstructive pneumonia: completed the abx course. Continue on steroid taper and bronchodilators   Chronic diastolic CHF: appears compensated currently. Will monitor I/Os.   Adenocarcinoma of the lung: stage IV. Next chemo will be in 3 weeks as per onco    Dysphagia: likely secondary to lung cancer. Continue on PPI. MBSS was not done yesterday as pt was receiving chemo and pt will have MBSS likely on 9/26   Generalized anxiety disorder: severity unknown. Ativan prn   Obesity: BMI 32.5. Complicates overall care & prognosis   DVT prophylaxis: lovenox  Code Status: full  Family Communication:  Disposition Plan: depends on PT/OT recs   Level of care: Progressive Cardiac  Status is: Inpatient  Remains inpatient appropriate because:Ongoing diagnostic testing needed not appropriate for outpatient work up, IV treatments appropriate due to intensity of illness or inability to take PO, and Inpatient level of care appropriate due to severity of illness  Dispo:  Patient From: Home  Planned Disposition: Home  Medically stable for discharge: No       Consultants:  onco  Procedures:   Antimicrobials:    Subjective: Pt c/o increased mucus  Objective: Vitals:   06/17/21 2053 06/17/21 2332 06/18/21 0122 06/18/21 0430   BP:  134/85  112/79  Pulse:  95  94  Resp:  18  18  Temp:  98.5 F (36.9 C)  98.1 F (36.7 C)  TempSrc:      SpO2: 94% 100% 100% 100%  Weight:      Height:        Intake/Output Summary (Last 24 hours) at 06/18/2021 0728 Last data filed at 06/18/2021 0400 Gross per 24 hour  Intake 562.27 ml  Output 500 ml  Net 62.27 ml   Filed Weights   06/15/21 1652 06/16/21 0545 06/17/21 0500  Weight: 93.8 kg 94.2 kg 94.9 kg    Examination:  General exam: appears calm but uncomfortable  Respiratory system: decreased breath sounds b/l Cardiovascular system: S1/S2+. No rubs or clicks  Gastrointestinal system: Abd is soft, NT, ND & normal bowel sounds  Central nervous system: Alert and oriented. Moves all extremities  Psychiatry: Judgement and insight appear normal. Flat mood and affect    Data Reviewed: I have personally reviewed following labs and imaging studies  CBC: Recent Labs  Lab 06/13/21 1750 06/14/21 0420 06/15/21 0434 06/16/21 0405 06/17/21 0420  WBC 15.9* 10.8* 16.2* 15.7* 11.6*  NEUTROABS  --  10.2*  --   --   --   HGB 15.4 14.7 14.8 14.9 14.3  HCT 45.3 43.6 44.9 43.6 42.6  MCV 84.7 81.8 83.1 84.0 82.2  PLT 266 268 232 245 397   Basic Metabolic Panel: Recent Labs  Lab 06/13/21 1750 06/13/21 2347 06/14/21 0420 06/15/21 0434 06/16/21 0405 06/17/21 0420  NA 135  --  134* 136 135 134*  K 4.1  --  3.9 3.9 3.6 3.4*  CL 92*  --  92* 91* 92* 94*  CO2 31  --  31 35* 33* 30  GLUCOSE 106*  --  145* 127* 118* 117*  BUN 19  --  21 25* 23 19  CREATININE 0.81  --  0.65 0.66 0.74 0.61  CALCIUM 9.5  --  9.4 9.3 9.5 8.9  MG  --  2.2 2.2  --   --   --   PHOS  --   --  4.4  --   --   --    GFR: Estimated Creatinine Clearance: 106.1 mL/min (by C-G formula based on SCr of 0.61 mg/dL). Liver Function Tests: Recent Labs  Lab 06/14/21 0420  AST 23  ALT 26  ALKPHOS 111  BILITOT 0.9  PROT 6.7  ALBUMIN 3.3*   No results for input(s): LIPASE, AMYLASE in the last 168  hours. No results for input(s): AMMONIA in the last 168 hours. Coagulation Profile: No results for input(s): INR, PROTIME in the last 168 hours. Cardiac Enzymes: No results for input(s): CKTOTAL, CKMB, CKMBINDEX, TROPONINI in the last 168 hours. BNP (last 3 results) No results for input(s): PROBNP in the last 8760 hours. HbA1C: No results for input(s): HGBA1C in the last 72 hours. CBG: Recent Labs  Lab 06/13/21 1347  GLUCAP 122*   Lipid Profile: No results for input(s): CHOL, HDL, LDLCALC, TRIG, CHOLHDL, LDLDIRECT in the last 72 hours. Thyroid Function Tests: No results for input(s): TSH, T4TOTAL, FREET4, T3FREE, THYROIDAB in the last 72 hours. Anemia Panel: No results for input(s): VITAMINB12, FOLATE, FERRITIN, TIBC, IRON, RETICCTPCT in the last 72 hours. Sepsis Labs: Recent Labs  Lab 06/13/21 1750 06/13/21 2347  PROCALCITON  --  <0.10  LATICACIDVEN 1.7 2.2*    Recent Results (from the past 240 hour(s))  Resp Panel by RT-PCR (Flu A&B, Covid) Nasopharyngeal Swab     Status: None   Collection Time: 06/13/21  8:27 PM   Specimen: Nasopharyngeal Swab; Nasopharyngeal(NP) swabs in vial transport medium  Result Value Ref Range Status   SARS Coronavirus 2 by RT PCR NEGATIVE NEGATIVE Final    Comment: (NOTE) SARS-CoV-2 target nucleic acids are NOT DETECTED.  The SARS-CoV-2 RNA is generally detectable in upper respiratory specimens during the acute phase of infection. The lowest concentration of SARS-CoV-2 viral copies this assay can detect is 138 copies/mL. A negative result does not preclude SARS-Cov-2 infection and should not be used as the sole basis for treatment or other patient management decisions. A negative result may occur with  improper specimen collection/handling, submission of specimen other than nasopharyngeal swab, presence of viral mutation(s) within the areas targeted by this assay, and inadequate number of viral copies(<138 copies/mL). A negative result must  be combined with clinical observations, patient history, and epidemiological information. The expected result is Negative.  Fact Sheet for Patients:  EntrepreneurPulse.com.au  Fact Sheet for Healthcare Providers:  IncredibleEmployment.be  This test is no t yet approved or cleared by the [place] FDA and  has been authorized for detection and/or diagnosis of SARS-CoV-2 by FDA under an Emergency Use Authorization (EUA). This EUA will remain  in effect (meaning this test can be used) for the duration of the COVID-19 declaration under Section 564(b)(1) of the Act, 21 U.S.C.section 360bbb-3(b)(1), unless the authorization is terminated  or revoked sooner.       Influenza A by PCR NEGATIVE NEGATIVE Final   Influenza B by PCR NEGATIVE NEGATIVE Final    Comment: (NOTE)  The Xpert Xpress SARS-CoV-2/FLU/RSV plus assay is intended as an aid in the diagnosis of influenza from Nasopharyngeal swab specimens and should not be used as a sole basis for treatment. Nasal washings and aspirates are unacceptable for Xpert Xpress SARS-CoV-2/FLU/RSV testing.  Fact Sheet for Patients: EntrepreneurPulse.com.au  Fact Sheet for Healthcare Providers: IncredibleEmployment.be  This test is not yet approved or cleared by the [place] FDA and has been authorized for detection and/or diagnosis of SARS-CoV-2 by FDA under an Emergency Use Authorization (EUA). This EUA will remain in effect (meaning this test can be used) for the duration of the COVID-19 declaration under Section 564(b)(1) of the Act, 21 U.S.C. section 360bbb-3(b)(1), unless the authorization is terminated or revoked.  Performed at [facility], [address] [place], [place] [address]          Radiology Studies: No results found.      Scheduled Meds:  acetylcysteine  3 mL Nebulization Once   aspirin EC  325 mg Oral Once    Chlorhexidine Gluconate Cloth  6 each Topical Daily   docusate sodium  200 mg Oral BID   enoxaparin (LOVENOX) injection  40 mg Subcutaneous Q24H   feeding supplement (NEPRO CARB STEADY)  237 mL Oral TID BM   guaiFENesin  600 mg Oral BID   ipratropium-albuterol  3 mL Nebulization Q6H   methylPREDNISolone (SOLU-MEDROL) injection  60 mg Intravenous Q12H   Continuous Infusions:     LOS: 5 days    Time spent: 20 mins     Wyvonnia Dusky, MD [facility] Pager [phone]  If 7PM-7AM, please contact night-coverage 06/18/2021, 7:28 AM

## 2021-06-19 LAB — CBC
HCT: 41.6 % (ref 39.0–52.0)
Hemoglobin: 14 g/dL (ref 13.0–17.0)
MCH: 28.3 pg (ref 26.0–34.0)
MCHC: 33.7 g/dL (ref 30.0–36.0)
MCV: 84 fL (ref 80.0–100.0)
Platelets: 159 10*3/uL (ref 150–400)
RBC: 4.95 MIL/uL (ref 4.22–5.81)
RDW: 12.5 % (ref 11.5–15.5)
WBC: 10.4 10*3/uL (ref 4.0–10.5)
nRBC: 0 % (ref 0.0–0.2)

## 2021-06-19 LAB — BASIC METABOLIC PANEL
Anion gap: 9 (ref 5–15)
BUN: 23 mg/dL (ref 8–23)
CO2: 35 mmol/L — ABNORMAL HIGH (ref 22–32)
Calcium: 9 mg/dL (ref 8.9–10.3)
Chloride: 94 mmol/L — ABNORMAL LOW (ref 98–111)
Creatinine, Ser: 0.64 mg/dL (ref 0.61–1.24)
GFR, Estimated: >60 mL/min (ref >60)
Glucose, Bld: 118 mg/dL — ABNORMAL HIGH (ref 70–99)
Potassium: 4.1 mmol/L (ref 3.5–5.1)
Sodium: 138 mmol/L (ref 135–145)

## 2021-06-19 NOTE — Progress Notes (Signed)
PROGRESS NOTE    Derek Clarke  GHW:[id number] DOB: 1956/01/29 DOA: 06/13/2021 PCP: Pcp, No   Assessment & Plan:   Principal Problem:   Acute on chronic respiratory failure with hypoxia (HCC) Active Problems:   COPD with acute exacerbation (HCC)   SOB (shortness of breath)   Elevated troponin   Dysphagia   GAD (generalized anxiety disorder)  Acute on chronic hypoxic respiratory failure: likely multifactorial including COPD exacerbation, lung cancer & possible postobstructive pneumonia.  Continue on supplemental oxygen & back to baseline of 4L [place]. Completed abx course. Continue on steroids, bronchodilators and encourage incentive spirometry   Possible postobstructive pneumonia: completed the abx course. Continue on steroid taper and bronchodilators   Chronic diastolic CHF: appears compensated currently. Will monitor I/Os.   Adenocarcinoma of the lung: stage IV. Next chemo will be in 3 weeks as per onco   Dysphagia: likely secondary to lung cancer. Continue on PPI. MBSS was not done last week as pt was receiving chemo and pt will have MBSS likely on 9/26   Generalized anxiety disorder: severity unknown. Ativan prn    Obesity: BMI 32.5. Complicates overall care & prognosis   DVT prophylaxis: lovenox  Code Status: full  Family Communication:  Disposition Plan: d/c back home   Level of care: Progressive Cardiac  Status is: Inpatient  Remains inpatient appropriate because:Ongoing diagnostic testing needed not appropriate for outpatient work up, IV treatments appropriate due to intensity of illness or inability to take PO, and Inpatient level of care appropriate due to severity of illness  Dispo:  Patient From: Home  Planned Disposition: Home  Medically stable for discharge: No       Consultants:  onco  Procedures:   Antimicrobials:    Subjective: Pt c/o intermittently having trouble swallowing   Objective: Vitals:   06/18/21 1950 06/18/21 2339 06/19/21 0257  06/19/21 0544  BP:  115/76 115/75   Pulse:  88 87   Resp:  16 19   Temp:  98.6 F (37 C) 98.7 F (37.1 C)   TempSrc:  Oral    SpO2: 97% 99% 97%   Weight:    93.8 kg  Height:       No intake or output data in the 24 hours ending 06/19/21 0719  Filed Weights   06/16/21 0545 06/17/21 0500 06/19/21 0544  Weight: 94.2 kg 94.9 kg 93.8 kg    Examination:  General exam: Appears uncomfortable  Respiratory system: diminished breath sounds b/l  Cardiovascular system: S1 & S2+. No rubs or clicks  Gastrointestinal system: Abd is soft, NT, ND, & hypoactive bowel sounds  Central nervous system: Alert and oriented. Moves all extremities  Psychiatry: Judgement and insight appear normal. Flat mood and affect    Data Reviewed: I have personally reviewed following labs and imaging studies  CBC: Recent Labs  Lab 06/14/21 0420 06/15/21 0434 06/16/21 0405 06/17/21 0420 06/18/21 0900 06/19/21 0416  WBC 10.8* 16.2* 15.7* 11.6* 10.4 10.4  NEUTROABS 10.2*  --   --   --   --   --   HGB 14.7 14.8 14.9 14.3 14.5 14.0  HCT 43.6 44.9 43.6 42.6 41.9 41.6  MCV 81.8 83.1 84.0 82.2 83.5 84.0  PLT 268 232 245 212 165 789   Basic Metabolic Panel: Recent Labs  Lab 06/13/21 2347 06/14/21 0420 06/15/21 0434 06/16/21 0405 06/17/21 0420 06/18/21 0900 06/19/21 0416  NA  --  134* 136 135 134* 136 138  K  --  3.9 3.9 3.6  3.4* 3.9 4.1  CL  --  92* 91* 92* 94* 94* 94*  CO2  --  31 35* 33* 30 32 35*  GLUCOSE  --  145* 127* 118* 117* 116* 118*  BUN  --  21 25* 23 19 19 23   CREATININE  --  0.65 0.66 0.74 0.61 0.59* 0.64  CALCIUM  --  9.4 9.3 9.5 8.9 9.0 9.0  MG 2.2 2.2  --   --   --   --   --   PHOS  --  4.4  --   --   --   --   --    GFR: Estimated Creatinine Clearance: 105.4 mL/min (by C-G formula based on SCr of 0.64 mg/dL). Liver Function Tests: Recent Labs  Lab 06/14/21 0420  AST 23  ALT 26  ALKPHOS 111  BILITOT 0.9  PROT 6.7  ALBUMIN 3.3*   No results for input(s): LIPASE,  AMYLASE in the last 168 hours. No results for input(s): AMMONIA in the last 168 hours. Coagulation Profile: No results for input(s): INR, PROTIME in the last 168 hours. Cardiac Enzymes: No results for input(s): CKTOTAL, CKMB, CKMBINDEX, TROPONINI in the last 168 hours. BNP (last 3 results) No results for input(s): PROBNP in the last 8760 hours. HbA1C: No results for input(s): HGBA1C in the last 72 hours. CBG: Recent Labs  Lab 06/13/21 1347  GLUCAP 122*   Lipid Profile: No results for input(s): CHOL, HDL, LDLCALC, TRIG, CHOLHDL, LDLDIRECT in the last 72 hours. Thyroid Function Tests: No results for input(s): TSH, T4TOTAL, FREET4, T3FREE, THYROIDAB in the last 72 hours. Anemia Panel: No results for input(s): VITAMINB12, FOLATE, FERRITIN, TIBC, IRON, RETICCTPCT in the last 72 hours. Sepsis Labs: Recent Labs  Lab 06/13/21 1750 06/13/21 2347  PROCALCITON  --  <0.10  LATICACIDVEN 1.7 2.2*    Recent Results (from the past 240 hour(s))  Resp Panel by RT-PCR (Flu A&B, Covid) Nasopharyngeal Swab     Status: None   Collection Time: 06/13/21  8:27 PM   Specimen: Nasopharyngeal Swab; Nasopharyngeal(NP) swabs in vial transport medium  Result Value Ref Range Status   SARS Coronavirus 2 by RT PCR NEGATIVE NEGATIVE Final    Comment: (NOTE) SARS-CoV-2 target nucleic acids are NOT DETECTED.  The SARS-CoV-2 RNA is generally detectable in upper respiratory specimens during the acute phase of infection. The lowest concentration of SARS-CoV-2 viral copies this assay can detect is 138 copies/mL. A negative result does not preclude SARS-Cov-2 infection and should not be used as the sole basis for treatment or other patient management decisions. A negative result may occur with  improper specimen collection/handling, submission of specimen other than nasopharyngeal swab, presence of viral mutation(s) within the areas targeted by this assay, and inadequate number of viral copies(<138  copies/mL). A negative result must be combined with clinical observations, patient history, and epidemiological information. The expected result is Negative.  Fact Sheet for Patients:  EntrepreneurPulse.com.au  Fact Sheet for Healthcare Providers:  IncredibleEmployment.be  This test is no t yet approved or cleared by the [place] FDA and  has been authorized for detection and/or diagnosis of SARS-CoV-2 by FDA under an Emergency Use Authorization (EUA). This EUA will remain  in effect (meaning this test can be used) for the duration of the COVID-19 declaration under Section 564(b)(1) of the Act, 21 U.S.C.section 360bbb-3(b)(1), unless the authorization is terminated  or revoked sooner.       Influenza A by PCR NEGATIVE NEGATIVE Final   Influenza  B by PCR NEGATIVE NEGATIVE Final    Comment: (NOTE) The Xpert Xpress SARS-CoV-2/FLU/RSV plus assay is intended as an aid in the diagnosis of influenza from Nasopharyngeal swab specimens and should not be used as a sole basis for treatment. Nasal washings and aspirates are unacceptable for Xpert Xpress SARS-CoV-2/FLU/RSV testing.  Fact Sheet for Patients: EntrepreneurPulse.com.au  Fact Sheet for Healthcare Providers: IncredibleEmployment.be  This test is not yet approved or cleared by the [place] FDA and has been authorized for detection and/or diagnosis of SARS-CoV-2 by FDA under an Emergency Use Authorization (EUA). This EUA will remain in effect (meaning this test can be used) for the duration of the COVID-19 declaration under Section 564(b)(1) of the Act, 21 U.S.C. section 360bbb-3(b)(1), unless the authorization is terminated or revoked.  Performed at [facility], [address] [place], [place] [address]          Radiology Studies: No results found.      Scheduled Meds:  acetylcysteine  3 mL Nebulization Once   aspirin  EC  325 mg Oral Once   Chlorhexidine Gluconate Cloth  6 each Topical Daily   docusate sodium  200 mg Oral BID   enoxaparin (LOVENOX) injection  40 mg Subcutaneous Q24H   feeding supplement (NEPRO CARB STEADY)  237 mL Oral TID BM   guaiFENesin  600 mg Oral BID   ipratropium-albuterol  3 mL Nebulization Q6H   methylPREDNISolone (SOLU-MEDROL) injection  60 mg Intravenous Q12H   Continuous Infusions:     LOS: 6 days    Time spent: 15 mins     Wyvonnia Dusky, MD [facility] Pager [phone]  If 7PM-7AM, please contact night-coverage 06/19/2021, 7:19 AM

## 2021-06-19 NOTE — Progress Notes (Addendum)
Encouraged patient to elevate legs due to swelling, but patient became irritated and said he would try to do it later.   By the end of shift, patient still had not elevated his legs.  Noted 2+ swelling BLE

## 2021-06-19 NOTE — TOC Initial Note (Addendum)
Transition of Care [facility]) - Initial/Assessment Note    Patient Details  Name: Derek Clarke MRN: [id number] Date of Birth: 1955-12-25  Transition of Care [facility]) CM/SW Contact:    Magnus Ivan, LCSW Phone Number: 06/19/2021, 9:52 AM  Clinical Narrative:                CSW spoke with patient. PT and OT recommend HH, tub bench, and 3in1. Patient stated he lives alone. He either drives himself, uses medical transportation, or his brother takes him to appointments. No PCP. Is followed by the [facility]. Patient declines a 3in1 at this time. He also declines HH, agreed to let staff know if he changes his mind. CSW explained that the hospital can't set up [facility] after discharge, but he should be able to ask the [facility] for assistance with this if he would like. Provided this CSW's direct # for any additional needs.    Expected Discharge Plan: Home/Self Care Barriers to Discharge: Continued Medical Work up   Patient Goals and CMS Choice Patient states their goals for this hospitalization and ongoing recovery are:: home with self care CMS Medicare.gov Compare Post Acute Care list provided to:: Patient Choice offered to / list presented to : Patient  Expected Discharge Plan and Services Expected Discharge Plan: Home/Self Care       Living arrangements for the past 2 months: Single Family Home                                      Prior Living Arrangements/Services Living arrangements for the past 2 months: Single Family Home Lives with:: Self Patient language and need for interpreter reviewed:: Yes Do you feel safe going back to the place where you live?: Yes      Need for Family Participation in Patient Care: Yes (Comment) Care giver support system in place?: Yes (comment)   Criminal Activity/Legal Involvement Pertinent to Current Situation/Hospitalization: No - Comment as needed  Activities of Daily Living Home Assistive Devices/Equipment: Oxygen ADL Screening  (condition at time of admission) Patient's cognitive ability adequate to safely complete daily activities?: No Is the patient deaf or have difficulty hearing?: No Does the patient have difficulty seeing, even when wearing glasses/contacts?: No Does the patient have difficulty concentrating, remembering, or making decisions?: No Patient able to express need for assistance with ADLs?: No Does the patient have difficulty dressing or bathing?: No Independently performs ADLs?: Yes (appropriate for developmental age) Does the patient have difficulty walking or climbing stairs?: No Weakness of Legs: None Weakness of Arms/Hands: None  Permission Sought/Granted                  Emotional Assessment       Orientation: : Oriented to Self, Oriented to Place, Oriented to  Time, Oriented to Situation Alcohol / Substance Use: Not Applicable Psych Involvement: No (comment)  Admission diagnosis:  Shortness of breath [R06.02] Dyspnea [R06.00] SOB (shortness of breath) [R06.02] Acute on chronic respiratory failure with hypoxia (HCC) [J96.21] Congestive heart failure, unspecified HF chronicity, unspecified heart failure type [facility]) [I50.9] Patient Active Problem List   Diagnosis Date Noted   SOB (shortness of breath) 06/13/2021   Elevated troponin 06/13/2021   Dysphagia 06/13/2021   GAD (generalized anxiety disorder) 06/13/2021   Palliative care encounter    Acute on chronic respiratory failure with hypoxia ([place]) 06/07/2021   Chronic respiratory failure with  hypoxia ([place]) 06/07/2021   COPD (chronic obstructive pulmonary disease) (HCC)    Severe sepsis (HCC)    Anxiety    COPD with acute exacerbation (HCC)    Adenocarcinoma of left lung ([place]) 05/26/2021   Hemoptysis 05/15/2021   Mass of left lung on CT 05/15/2021   Acute respiratory failure with hypoxia ([place]) 05/15/2021   Nicotine dependence 05/15/2021   PCP:  Pcp, No Pharmacy:   [id number] [place], [address] [place] [address] Phone: [phone] Fax: [phone]     Social Determinants of Health (SDOH) Interventions    Readmission Risk Interventions No flowsheet data found.

## 2021-06-20 ENCOUNTER — Inpatient Hospital Stay: Payer: Self-pay

## 2021-06-20 LAB — BASIC METABOLIC PANEL
Anion gap: 10 (ref 5–15)
BUN: 24 mg/dL — ABNORMAL HIGH (ref 8–23)
CO2: 32 mmol/L (ref 22–32)
Calcium: 8.9 mg/dL (ref 8.9–10.3)
Chloride: 92 mmol/L — ABNORMAL LOW (ref 98–111)
Creatinine, Ser: 0.61 mg/dL (ref 0.61–1.24)
GFR, Estimated: >60 mL/min (ref >60)
Glucose, Bld: 116 mg/dL — ABNORMAL HIGH (ref 70–99)
Potassium: 3.9 mmol/L (ref 3.5–5.1)
Sodium: 134 mmol/L — ABNORMAL LOW (ref 135–145)

## 2021-06-20 LAB — CBC
HCT: 43.3 % (ref 39.0–52.0)
Hemoglobin: 14.2 g/dL (ref 13.0–17.0)
MCH: 27.1 pg (ref 26.0–34.0)
MCHC: 32.8 g/dL (ref 30.0–36.0)
MCV: 82.6 fL (ref 80.0–100.0)
Platelets: 132 10*3/uL — ABNORMAL LOW (ref 150–400)
RBC: 5.24 MIL/uL (ref 4.22–5.81)
RDW: 12.6 % (ref 11.5–15.5)
WBC: 10.1 10*3/uL (ref 4.0–10.5)
nRBC: 0 % (ref 0.0–0.2)

## 2021-06-20 MED ORDER — LACTULOSE 10 GM/15ML PO SOLN
20.0000 g | Freq: Two times a day (BID) | ORAL | Status: DC
  Administered 2021-06-20 – 2021-07-02 (×5): 20 g via ORAL
  Filled 2021-06-20 (×12): qty 30

## 2021-06-20 MED ORDER — PANTOPRAZOLE SODIUM 40 MG IV SOLR
40.0000 mg | Freq: Every day | INTRAVENOUS | Status: DC
  Administered 2021-06-20 – 2021-07-02 (×12): 40 mg via INTRAVENOUS
  Filled 2021-06-20 (×12): qty 40

## 2021-06-20 MED ORDER — SODIUM CHLORIDE 0.9 % IV SOLN
12.5000 mg | Freq: Four times a day (QID) | INTRAVENOUS | Status: DC | PRN
  Filled 2021-06-20: qty 0.5

## 2021-06-20 MED ORDER — METHYLPREDNISOLONE SODIUM SUCC 40 MG IJ SOLR
40.0000 mg | Freq: Two times a day (BID) | INTRAMUSCULAR | Status: DC
  Administered 2021-06-20 – 2021-06-23 (×6): 40 mg via INTRAVENOUS
  Filled 2021-06-20 (×6): qty 1

## 2021-06-20 MED ORDER — FUROSEMIDE 10 MG/ML IJ SOLN
40.0000 mg | Freq: Every day | INTRAMUSCULAR | Status: DC
  Administered 2021-06-20 – 2021-06-24 (×5): 40 mg via INTRAVENOUS
  Filled 2021-06-20 (×5): qty 4

## 2021-06-20 MED ORDER — ENOXAPARIN SODIUM 60 MG/0.6ML IJ SOSY
0.5000 mg/kg | PREFILLED_SYRINGE | INTRAMUSCULAR | Status: DC
  Administered 2021-06-20 – 2021-06-22 (×3): 47.5 mg via SUBCUTANEOUS
  Filled 2021-06-20 (×3): qty 0.47

## 2021-06-20 MED ORDER — ALUM & MAG HYDROXIDE-SIMETH 200-200-20 MG/5ML PO SUSP
30.0000 mL | ORAL | Status: DC | PRN
  Administered 2021-06-20: 30 mL via ORAL
  Filled 2021-06-20: qty 30

## 2021-06-20 NOTE — Progress Notes (Signed)
Objective Swallowing Evaluation: Type of Study: MBS-Modified Barium Swallow Study   Patient Details  Name: Derek Clarke MRN: [id number] Date of Birth: [birth year]  Today's Date: 06/20/2021 Time: SLP Start Time (ACUTE ONLY): 69 -SLP Stop Time (ACUTE ONLY): 1345  SLP Time Calculation (min) (ACUTE ONLY): 25 min   Past Medical History:  Past Medical History:  Diagnosis Date   Adenocarcinoma ([place])    lung; stage 4 with mets to liver   Chronic respiratory failure with hypoxia (HCC)    baseline 4 L [place]   COPD (chronic obstructive pulmonary disease) (HCC)    GAD (generalized anxiety disorder)    Past Surgical History:  Past Surgical History:  Procedure Laterality Date   IR IMAGING GUIDED PORT INSERTION  06/03/2021   HPI: Pt is a 65 y.o. 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  Patient is now readmitted 06/13/2021 with same.  Patient was recently hospitalized at [facility] from 06/07/2021 06/09/2021 for acute on chronic hypoxic respiratory failure in the setting of acute COPD exacerbation.  During his previous hospitalization, he underwent CTA chest on 06/24/2021 which was notable for no evidence of acute pulmonary embolism, but showed persistent left hilar Mass, with interval increase in consolidation of the left upper lobe, felt to represent either progression of malignancy versus postobstructive changes.  Patient having difficulty swallowing.  Recent CT scan showed wall thickening of the Esophagus but no other abnormality was noted. PPI initiated by MD. 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   Subjective: upset, reports he is having a rough day    Assessment / Plan / Recommendation  CHL IP CLINICAL IMPRESSIONS 06/20/2021  Clinical Impression Patient presents  with moderate oropharyngeal dysphagia per this assessment which was somewhat limited by pt's willingness to participate. Oral deficits appear due to impaired sensation vs strength, or perhaps pt's anticipation of difficulty. There is rocking of the bolus during oral transfer, premature spillage to the valleculae with liquids with bolus hold.  Swallow initiation occurs as the bolus contacts the posterior surface of the epiglottis with liquids. Pharyngeal stage is noted for reduced hyolaryngeal excursion. Epiglottic deflection is partial; penetration/aspiration occurs during the swallow with thin liquids due to reduced laryngeal closure. With left vocal cord paralysis, pt is unable to protect the airway or generate protective cough when penetration/aspiration occurs. Pt had delayed or absent sensation of aspiration. Mild residue remains in the valleculae post-swallow; this appears attributable to reduction of flow through the pharyngoesophageal segment (reduced amplitude/duration of PE segment opening due to weak hyolaryngeal excursion). Pt able to clear residue with second swallow. Limited compensatory maneuvers were attempted; head turn left was marginally effective but did not prevent aspiration, and patient had difficulty performing this correctly. Due to pt participation unable to assess varying bolus sizes/additional maneuvers that may increase airway protection.  Heavier textures appear to facilitate improved closure/epiglottic deflection, however this is difficult to determine with limited trials/textures. Consistencies tested were thin liquids x4 teaspoons (3 with head turn), nectar x2 cup sips, puree x2 1/2 tsps. Pt required encouragement throughout assessment. Recommend continue dys. 1 (puree) and nectar thick liquids by cup, no straws, meds crushed. Due to efforts of oral intake, pt may need alternative means of nutrition/hydration. D/w MD via secure chat.  SLP Visit Diagnosis Dysphagia, oropharyngeal  phase (R13.12)  Impact on safety and function Moderate aspiration risk;Risk for inadequate nutrition/hydration      CHL IP TREATMENT RECOMMENDATION 06/20/2021  Treatment Recommendations Therapy as outlined in treatment plan below     Prognosis 06/20/2021  Prognosis for Safe Diet Advancement Guarded  Barriers to Reach Goals Severity of deficits;Motivation  Barriers/Prognosis Comment pulmonary decline    CHL IP DIET RECOMMENDATION 06/20/2021  SLP Diet Recommendations Dysphagia 1 (Puree) solids;Nectar thick liquid  Liquid Administration via Cup  Medication Administration Crushed with puree  Compensations Slow rate;Small sips/bites;Multiple dry swallows after each bite/sip  Postural Changes Seated upright at 90 degrees      CHL IP OTHER RECOMMENDATIONS 06/20/2021  Recommended Consults --  Oral Care Recommendations Oral care before and after PO  Other Recommendations Order thickener from pharmacy;Prohibited food (jello, ice cream, thin soups);Have oral suction available;Remove water pitcher      CHL IP FOLLOW UP RECOMMENDATIONS 06/20/2021  Follow up Recommendations Other (comment)      CHL IP FREQUENCY AND DURATION 06/20/2021  Speech Therapy Frequency (ACUTE ONLY) min 3x week  Treatment Duration 2 weeks           CHL IP ORAL PHASE 06/20/2021  Oral Phase Impaired  Oral - Pudding Teaspoon --  Oral - Pudding Cup --  Oral - Honey Teaspoon --  Oral - Honey Cup --  Oral - Nectar Teaspoon --  Oral - Nectar Cup --  Oral - Nectar Straw --  Oral - Thin Teaspoon Reduced posterior propulsion;Decreased bolus cohesion;Premature spillage;Delayed oral transit;Other (Comment)  Oral - Thin Cup --  Oral - Thin Straw --  Oral - Puree --  Oral - Mech Soft --  Oral - Regular --  Oral - Multi-Consistency --  Oral - Pill --  Oral Phase - Comment --    CHL IP PHARYNGEAL PHASE 06/20/2021  Pharyngeal Phase Impaired  Pharyngeal- Pudding Teaspoon --  Pharyngeal --  Pharyngeal- Pudding Cup --   Pharyngeal --  Pharyngeal- Honey Teaspoon --  Pharyngeal --  Pharyngeal- Honey Cup --  Pharyngeal --  Pharyngeal- Nectar Teaspoon --  Pharyngeal --  Pharyngeal- Nectar Cup Reduced airway/laryngeal closure;Reduced laryngeal elevation;Reduced anterior laryngeal mobility;Penetration/Aspiration during swallow;Pharyngeal residue - valleculae  Pharyngeal Material enters airway, remains ABOVE vocal cords then ejected out  Pharyngeal- Nectar Straw --  Pharyngeal --  Pharyngeal- Thin Teaspoon Penetration/Aspiration during swallow;Reduced airway/laryngeal closure;Reduced epiglottic inversion;Pharyngeal residue - valleculae;Delayed swallow initiation-pyriform sinuses  Pharyngeal Material enters airway, CONTACTS cords and not ejected out;Material enters airway, remains ABOVE vocal cords and not ejected out  Pharyngeal- Thin Cup --  Pharyngeal --  Pharyngeal- Thin Straw --  Pharyngeal --  Pharyngeal- Puree Delayed swallow initiation-vallecula;Reduced epiglottic inversion  Pharyngeal Material does not enter airway  Pharyngeal- Mechanical Soft --  Pharyngeal --  Pharyngeal- Regular --  Pharyngeal --  Pharyngeal- Multi-consistency --  Pharyngeal --  Pharyngeal- Pill --  Pharyngeal --  Pharyngeal Comment --     CHL IP CERVICAL ESOPHAGEAL PHASE 06/20/2021  Cervical Esophageal Phase Impaired  Pudding Teaspoon --  Pudding Cup --  Honey Teaspoon --  Honey Cup --  Nectar Teaspoon --  Nectar Cup --  Nectar Straw --  Thin Teaspoon --  Thin Cup --  Thin Straw --  Puree --  Mechanical Soft --  Regular --  Multi-consistency --  Pill --  Cervical Esophageal Comment --    Deneise Lever, MS, CCC-SLP Speech-Language Pathologist  Aliene Altes 06/20/2021, 6:38 PM

## 2021-06-20 NOTE — Progress Notes (Signed)
PHARMACIST - PHYSICIAN COMMUNICATION  CONCERNING:  Enoxaparin (Lovenox) for DVT Prophylaxis    RECOMMENDATION: Patient was prescribed enoxaparin 40mg  q24 hours for VTE prophylaxis.   Filed Weights   06/17/21 0500 06/19/21 0544 06/20/21 0533  Weight: 94.9 kg (209 lb 3.2 oz) 93.8 kg (206 lb 14.4 oz) 94 kg (207 lb 4.8 oz)    Body mass index is 30.61 kg/m.  Estimated Creatinine Clearance: 105.6 mL/min (by C-G formula based on SCr of 0.61 mg/dL).   Based on [place] patient is candidate for enoxaparin 0.5mg /kg TBW SQ every 24 hours based on BMI being >30.  DESCRIPTION: Pharmacy has adjusted enoxaparin dose per [facility] policy.  Patient is now receiving enoxaparin 47.5 mg every 24 hours    Benita Gutter 06/20/2021 8:13 AM

## 2021-06-20 NOTE — Progress Notes (Signed)
PROGRESS NOTE    Derek Clarke  SWH:[id number] DOB: November 22, 1955 DOA: 06/13/2021 PCP: Pcp, No   Assessment & Plan:   Principal Problem:   Acute on chronic respiratory failure with hypoxia (HCC) Active Problems:   COPD with acute exacerbation (HCC)   SOB (shortness of breath)   Elevated troponin   Dysphagia   GAD (generalized anxiety disorder)  Acute on chronic hypoxic respiratory failure: likely multifactorial including COPD exacerbation, lung cancer & possible postobstructive pneumonia.  Continue on supplemental oxygen & back to baseline of 4L [place]. Completed abx course. Continue on steroid taper, bronchodilators and encourage incentive spirometry & flutter valve   Possible postobstructive pneumonia: completed abx course. Continue on steroid taper & bronchodilators   Chronic diastolic CHF: not on any diuretics at home as per med rec. Will start lasix. Will continue to monitor I/Os.   Constipation: continue on colace & started on lactulose   Adenocarcinoma of the lung: stage IV. Last chemo 06/17/21. Next chemo will be in 3 weeks as per onco   Thrombocytopenia: likely secondary to recent chemo.   Dysphagia: likely secondary to lung cancer. Continue on PPI. MBSS will be done today and depending on the results, alternative foods of eating will be discussed    Generalized anxiety disorder: severity unknown. Ativan prn   Obesity: BMI 30.6. Complicates overall care & prognosis   DVT prophylaxis: lovenox  Code Status: full  Family Communication:  Disposition Plan: d/c back home   Level of care: Progressive Cardiac  Status is: Inpatient  Remains inpatient appropriate because:Ongoing diagnostic testing needed not appropriate for outpatient work up, IV treatments appropriate due to intensity of illness or inability to take PO, and Inpatient level of care appropriate due to severity of illness  Dispo:  Patient From: Home  Planned Disposition: Home  Medically stable for discharge:  No       Consultants:  onco  Procedures:   Antimicrobials:    Subjective: Pt c/o constipation   Objective: Vitals:   06/19/21 2353 06/20/21 0400 06/20/21 0533 06/20/21 0720  BP: 106/69 115/76  107/75  Pulse: 89 99  89  Resp: 18   17  Temp: 97.7 F (36.5 C) 97.9 F (36.6 C)  98.2 F (36.8 C)  TempSrc: Oral Oral    SpO2: 96% 96%  95%  Weight:   94 kg   Height:        Intake/Output Summary (Last 24 hours) at 06/20/2021 0730 Last data filed at 06/20/2021 6659 Gross per 24 hour  Intake 241 ml  Output --  Net 241 ml    Filed Weights   06/17/21 0500 06/19/21 0544 06/20/21 0533  Weight: 94.9 kg 93.8 kg 94 kg    Examination:  General exam: Appears calm but uncomfortable   Respiratory system: decreased breath sounds b/l Cardiovascular system: S1 & S2+. No rubs or clicks  Gastrointestinal system: Abd is soft, NT, ND & hypoactive bowel sounds Central nervous system: Alert and oriented. Moves all extremities  Psychiatry: Judgement and insight appear normal. Flat mood and affect     Data Reviewed: I have personally reviewed following labs and imaging studies  CBC: Recent Labs  Lab 06/14/21 0420 06/15/21 0434 06/16/21 0405 06/17/21 0420 06/18/21 0900 06/19/21 0416 06/20/21 0453  WBC 10.8*   < > 15.7* 11.6* 10.4 10.4 10.1  NEUTROABS 10.2*  --   --   --   --   --   --   HGB 14.7   < > 14.9 14.3  14.5 14.0 14.2  HCT 43.6   < > 43.6 42.6 41.9 41.6 43.3  MCV 81.8   < > 84.0 82.2 83.5 84.0 82.6  PLT 268   < > 245 212 165 159 132*   < > = values in this interval not displayed.   Basic Metabolic Panel: Recent Labs  Lab 06/13/21 2347 06/14/21 0420 06/15/21 0434 06/16/21 0405 06/17/21 0420 06/18/21 0900 06/19/21 0416 06/20/21 0453  NA  --  134*   < > 135 134* 136 138 134*  K  --  3.9   < > 3.6 3.4* 3.9 4.1 3.9  CL  --  92*   < > 92* 94* 94* 94* 92*  CO2  --  31   < > 33* 30 32 35* 32  GLUCOSE  --  145*   < > 118* 117* 116* 118* 116*  BUN  --  21   < >  23 19 19 23  24*  CREATININE  --  0.65   < > 0.74 0.61 0.59* 0.64 0.61  CALCIUM  --  9.4   < > 9.5 8.9 9.0 9.0 8.9  MG 2.2 2.2  --   --   --   --   --   --   PHOS  --  4.4  --   --   --   --   --   --    < > = values in this interval not displayed.   GFR: Estimated Creatinine Clearance: 105.6 mL/min (by C-G formula based on SCr of 0.61 mg/dL). Liver Function Tests: Recent Labs  Lab 06/14/21 0420  AST 23  ALT 26  ALKPHOS 111  BILITOT 0.9  PROT 6.7  ALBUMIN 3.3*   No results for input(s): LIPASE, AMYLASE in the last 168 hours. No results for input(s): AMMONIA in the last 168 hours. Coagulation Profile: No results for input(s): INR, PROTIME in the last 168 hours. Cardiac Enzymes: No results for input(s): CKTOTAL, CKMB, CKMBINDEX, TROPONINI in the last 168 hours. BNP (last 3 results) No results for input(s): PROBNP in the last 8760 hours. HbA1C: No results for input(s): HGBA1C in the last 72 hours. CBG: Recent Labs  Lab 06/13/21 1347  GLUCAP 122*   Lipid Profile: No results for input(s): CHOL, HDL, LDLCALC, TRIG, CHOLHDL, LDLDIRECT in the last 72 hours. Thyroid Function Tests: No results for input(s): TSH, T4TOTAL, FREET4, T3FREE, THYROIDAB in the last 72 hours. Anemia Panel: No results for input(s): VITAMINB12, FOLATE, FERRITIN, TIBC, IRON, RETICCTPCT in the last 72 hours. Sepsis Labs: Recent Labs  Lab 06/13/21 1750 06/13/21 2347  PROCALCITON  --  <0.10  LATICACIDVEN 1.7 2.2*    Recent Results (from the past 240 hour(s))  Resp Panel by RT-PCR (Flu A&B, Covid) Nasopharyngeal Swab     Status: None   Collection Time: 06/13/21  8:27 PM   Specimen: Nasopharyngeal Swab; Nasopharyngeal(NP) swabs in vial transport medium  Result Value Ref Range Status   SARS Coronavirus 2 by RT PCR NEGATIVE NEGATIVE Final    Comment: (NOTE) SARS-CoV-2 target nucleic acids are NOT DETECTED.  The SARS-CoV-2 RNA is generally detectable in upper respiratory specimens during the acute  phase of infection. The lowest concentration of SARS-CoV-2 viral copies this assay can detect is 138 copies/mL. A negative result does not preclude SARS-Cov-2 infection and should not be used as the sole basis for treatment or other patient management decisions. A negative result may occur with  improper specimen collection/handling, submission of specimen other  than nasopharyngeal swab, presence of viral mutation(s) within the areas targeted by this assay, and inadequate number of viral copies(<138 copies/mL). A negative result must be combined with clinical observations, patient history, and epidemiological information. The expected result is Negative.  Fact Sheet for Patients:  EntrepreneurPulse.com.au  Fact Sheet for Healthcare Providers:  IncredibleEmployment.be  This test is no t yet approved or cleared by the [place] FDA and  has been authorized for detection and/or diagnosis of SARS-CoV-2 by FDA under an Emergency Use Authorization (EUA). This EUA will remain  in effect (meaning this test can be used) for the duration of the COVID-19 declaration under Section 564(b)(1) of the Act, 21 U.S.C.section 360bbb-3(b)(1), unless the authorization is terminated  or revoked sooner.       Influenza A by PCR NEGATIVE NEGATIVE Final   Influenza B by PCR NEGATIVE NEGATIVE Final    Comment: (NOTE) The Xpert Xpress SARS-CoV-2/FLU/RSV plus assay is intended as an aid in the diagnosis of influenza from Nasopharyngeal swab specimens and should not be used as a sole basis for treatment. Nasal washings and aspirates are unacceptable for Xpert Xpress SARS-CoV-2/FLU/RSV testing.  Fact Sheet for Patients: EntrepreneurPulse.com.au  Fact Sheet for Healthcare Providers: IncredibleEmployment.be  This test is not yet approved or cleared by the [place] FDA and has been authorized for detection and/or diagnosis of  SARS-CoV-2 by FDA under an Emergency Use Authorization (EUA). This EUA will remain in effect (meaning this test can be used) for the duration of the COVID-19 declaration under Section 564(b)(1) of the Act, 21 U.S.C. section 360bbb-3(b)(1), unless the authorization is terminated or revoked.  Performed at [facility], [address] [place], [place] [address]          Radiology Studies: No results found.      Scheduled Meds:  acetylcysteine  3 mL Nebulization Once   aspirin EC  325 mg Oral Once   Chlorhexidine Gluconate Cloth  6 each Topical Daily   docusate sodium  200 mg Oral BID   enoxaparin (LOVENOX) injection  40 mg Subcutaneous Q24H   feeding supplement (NEPRO CARB STEADY)  237 mL Oral TID BM   guaiFENesin  600 mg Oral BID   ipratropium-albuterol  3 mL Nebulization Q6H   methylPREDNISolone (SOLU-MEDROL) injection  60 mg Intravenous Q12H   Continuous Infusions:  promethazine (PHENERGAN) injection (IM or IVPB)        LOS: 7 days    Time spent: 15 mins     Wyvonnia Dusky, MD [facility] Pager [phone]  If 7PM-7AM, please contact night-coverage 06/20/2021, 7:30 AM

## 2021-06-20 NOTE — Progress Notes (Signed)
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 Note: pt with two recent admissions with similar presentation.    PT Comments    Pt remained Ind with functional mobility but also with poor activity tolerance.  Pt ambulated 30 feet as self-selected maximum and reported the effort as "medium".  Pt's SpO2 and HR were both WNL on 4LO2/min pre and post amb. Pt will benefit from participating with cardiac rehab/lung works upon discharge for increased activity tolerance and decreased risk of rapid functional decline.    Recommendations for follow up therapy are one component of a multi-disciplinary discharge planning process, led by the attending physician.  Recommendations may be updated based on patient status, additional functional criteria and insurance authorization.  Follow Up Recommendations        Equipment Recommendations  None recommended by PT    Recommendations for Other Services       Precautions / Restrictions Precautions Precautions: Fall Precaution Comments: Metastatic disease Restrictions Weight Bearing Restrictions: No     Mobility  Bed Mobility               General bed mobility comments: NT, pt in recliner    Transfers Overall transfer level: Independent Equipment used: None Transfers: Sit to/from Stand Sit to Stand: Independent         General transfer comment: Good control and stability  Ambulation/Gait Ambulation/Gait assistance: Independent Gait Distance (Feet): 30  Feet Assistive device: None   Gait velocity: mildly decreased   General Gait Details: Slow cadence with short B step length but steady without LOB with SpO2 and HR WNL on 4LO2/min   Stairs             Wheelchair Mobility    Modified Rankin (Stroke Patients Only)       Balance Overall balance assessment: No apparent balance deficits (not formally assessed)                                          Cognition Arousal/Alertness: Awake/alert Behavior During Therapy: WFL for tasks assessed/performed Overall Cognitive Status: Within Functional Limits for tasks assessed                                        Exercises Other Exercises Other Exercises: Pt education provided on dyspnea spiral, physiological benefits of activity, and priniciples of activity progression including RPE goals    General Comments        Pertinent Vitals/Pain Pain Assessment: No/denies pain Faces Pain Scale: Hurts little more    Home Living                      Prior Function            PT Goals (current goals can now be found in the care plan section) Progress towards PT goals: Progressing toward  goals    Frequency    Min 2X/week      PT Plan Current plan remains appropriate    Co-evaluation              AM-PAC PT "6 Clicks" Mobility   Outcome Measure  Help needed turning from your back to your side while in a flat bed without using bedrails?: None Help needed moving from lying on your back to sitting on the side of a flat bed without using bedrails?: None   Help needed standing up from a chair using your arms (e.g., wheelchair or bedside chair)?: None Help needed to walk in hospital room?: None Help needed climbing 3-5 steps with a railing? : A Little 6 Click Score: 19    End of Session Equipment Utilized During Treatment: Oxygen Activity Tolerance: Patient tolerated treatment well Patient left: in chair;with call  bell/phone within reach Nurse Communication: Mobility status PT Visit Diagnosis: Difficulty in walking, not elsewhere classified (R26.2)     Time: 1855-0158 PT Time Calculation (min) (ACUTE ONLY): 11 min  Charges:  $Therapeutic Activity: 8-22 mins                     D. Scott Daira Hine PT, DPT 06/20/21, 2:07 PM

## 2021-06-20 NOTE — Progress Notes (Signed)
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  INTERVAL HISTORY: Patient received chemotherapy last Thursday and tolerating his treatments relatively well.  He continues to have significant shortness of breath and cough, but appears to be at his baseline.  He does not complain of any increased nausea or vomiting.  He has chronic weakness and fatigue.  He denies any pain.    REVIEW OF SYSTEMS:   Review of Systems  Constitutional:  Positive for malaise/fatigue. Negative for fever and weight loss.  HENT:  Negative for sore throat.   Respiratory:  Positive for cough and shortness of breath. Negative for hemoptysis and stridor.   Cardiovascular: Negative.  Negative for chest pain and leg swelling.  Gastrointestinal: Negative.  Negative for abdominal pain and nausea.  Genitourinary: Negative.  Negative for dysuria.  Musculoskeletal: Negative.  Negative for back pain.  Skin: Negative.  Negative for rash.  Neurological:  Positive for weakness. Negative for dizziness, focal weakness and headaches.  Psychiatric/Behavioral: Negative.  The patient is not nervous/anxious.    As per HPI. Otherwise, a complete review of systems is negative.  PAST MEDICAL HISTORY: Past Medical History:  Diagnosis Date   Adenocarcinoma (HCC)    lung; stage 4 with mets to liver   Chronic respiratory failure with hypoxia (HCC)    baseline 4 L    COPD (chronic obstructive pulmonary disease) (HCC)    GAD (generalized anxiety disorder)     PAST SURGICAL HISTORY: Past Surgical History:  Procedure Laterality Date   IR IMAGING GUIDED PORT INSERTION  06/03/2021    FAMILY HISTORY: Family History  Problem Relation Age of Onset   Lung disease Mother     Dementia Father     ADVANCED DIRECTIVES (Y/N):  @ADVDIR @  HEALTH MAINTENANCE: Social History   Tobacco Use   Smoking status: Former    Packs/day: 1.00    Years: 30.00    Pack years: 30.00    Types: Cigarettes    Quit date: 03/26/2021    Years since quitting: 0.2   Smokeless tobacco: Never  Substance Use Topics   Alcohol use: Yes   Drug use: Yes    Types: Marijuana    Comment: quit approx. 5 years ago     Colonoscopy:  PAP:  Bone density:  Lipid panel:  No Known Allergies  Current Facility-Administered Medications  Medication Dose Route Frequency Provider Last Rate Last Admin   acetaminophen (TYLENOL) tablet 650 mg  650 mg Oral Q6H PRN Howerter, Justin B, DO       Or   acetaminophen (TYLENOL) suppository 650 mg  650 mg Rectal Q6H PRN Howerter, Justin B, DO       albuterol (PROVENTIL) (2.5 MG/3ML) 0.083% nebulizer solution 2.5 mg  2.5 mg Nebulization Q4H PRN Howerter, Justin B, DO   2.5 mg at 06/17/21 1746   alum & mag hydroxide-simeth (MAALOX/MYLANTA) 200-200-20 MG/5ML suspension 30 mL  30 mL Oral Q4H PRN Mansy, Jan A, MD   30 mL at 06/20/21 0106   Chlorhexidine Gluconate Cloth 2 % PADS 6 each  6 each Topical Daily 06/22/21, MD   6 each at 06/20/21 1015   diphenhydrAMINE (BENADRYL) capsule 25 mg  25 mg Oral Q6H PRN 06/22/21, MD  25 mg at 06/19/21 2105   docusate sodium (COLACE) capsule 200 mg  200 mg Oral BID Wyvonnia Dusky, MD   200 mg at 06/20/21 1011   enoxaparin (LOVENOX) injection 47.5 mg  0.5 mg/kg Subcutaneous Q24H Benita Gutter, RPH   47.5 mg at 06/20/21 1010   feeding supplement (NEPRO CARB STEADY) liquid 237 mL  237 mL Oral TID BM Bonnielee Haff, MD 0 mL/hr at 06/18/21 1456 237 mL at 06/20/21 1259   furosemide (LASIX) injection 40 mg  40 mg Intravenous Daily Wyvonnia Dusky, MD   40 mg at 06/20/21 1350   guaiFENesin (MUCINEX) 12 hr tablet 600 mg  600 mg Oral BID Bonnielee Haff, MD   600 mg at 06/20/21 1011   ipratropium-albuterol  (DUONEB) 0.5-2.5 (3) MG/3ML nebulizer solution 3 mL  3 mL Nebulization Q6H Howerter, Justin B, DO   3 mL at 06/20/21 1356   lactulose (CHRONULAC) 10 GM/15ML solution 20 g  20 g Oral BID Wyvonnia Dusky, MD   20 g at 06/20/21 1011   LORazepam (ATIVAN) injection 0.5 mg  0.5 mg Intravenous Q4H PRN Howerter, Justin B, DO   0.5 mg at 06/18/21 2126   methylPREDNISolone sodium succinate (SOLU-MEDROL) 40 mg/mL injection 40 mg  40 mg Intravenous Q12H Wyvonnia Dusky, MD       ondansetron [facility]) injection 4 mg  4 mg Intravenous Q6H PRN Howerter, Justin B, DO   4 mg at 06/19/21 2321   pantoprazole (PROTONIX) injection 40 mg  40 mg Intravenous Daily Wyvonnia Dusky, MD   40 mg at 06/20/21 1258   promethazine (PHENERGAN) 12.5 mg in sodium chloride 0.9 % 50 mL IVPB  12.5 mg Intravenous Q6H PRN Mansy, Jan A, MD        OBJECTIVE: Vitals:   06/20/21 1356 06/20/21 1519  BP:  113/77  Pulse:  68  Resp:  17  Temp:  98.6 F (37 C)  SpO2: 97% 98%     Body mass index is 30.61 kg/m.    ECOG FS:2 - Symptomatic, <50% confined to bed  General: Ill-appearing, no acute distress. Eyes: Pink conjunctiva, anicteric sclera. HEENT: Normocephalic, moist mucous membranes. Lungs: No audible wheezing or coughing. Heart: Regular rate and rhythm. Abdomen: Soft, nontender, no obvious distention. Musculoskeletal: No edema, cyanosis, or clubbing. Neuro: Alert, answering all questions appropriately. Cranial nerves grossly intact. Skin: No rashes or petechiae noted. Psych: Normal affect.  LAB RESULTS:  Lab Results  Component Value Date   NA 134 (L) 06/20/2021   K 3.9 06/20/2021   CL 92 (L) 06/20/2021   CO2 32 06/20/2021   GLUCOSE 116 (H) 06/20/2021   BUN 24 (H) 06/20/2021   CREATININE 0.61 06/20/2021   CALCIUM 8.9 06/20/2021   PROT 6.7 06/14/2021   ALBUMIN 3.3 (L) 06/14/2021   AST 23 06/14/2021   ALT 26 06/14/2021   ALKPHOS 111 06/14/2021   BILITOT 0.9 06/14/2021   GFRNONAA >60 06/20/2021    Lab  Results  Component Value Date   WBC 10.1 06/20/2021   NEUTROABS 10.2 (H) 06/14/2021   HGB 14.2 06/20/2021   HCT 43.3 06/20/2021   MCV 82.6 06/20/2021   PLT 132 (L) 06/20/2021     STUDIES: CT Angio Chest PE W and/or Wo Contrast  Result Date: 06/07/2021 CLINICAL DATA:  Tachycardia, vomiting, metastatic lung cancer EXAM: CT ANGIOGRAPHY CHEST WITH CONTRAST TECHNIQUE: Multidetector CT imaging of the chest was performed using the standard protocol during bolus administration of intravenous contrast. Multiplanar  CT image reconstructions and MIPs were obtained to evaluate the vascular anatomy. CONTRAST:  49mL OMNIPAQUE IOHEXOL 350 MG/ML SOLN COMPARISON:  06/08/2011, 05/15/2021, FINDINGS: Cardiovascular: This is a technically adequate evaluation of the pulmonary vasculature. No filling defects or pulmonary emboli. Stable left hilar soft tissue mass encasing the left upper and left lower pulmonary arteries. Stable trace pericardial effusion. The heart is not enlarged. No evidence of thoracic aortic aneurysm or dissection. Mild atherosclerosis of the aorta and coronary vessels. Mediastinum/Nodes: There is segmental wall thickening of the mid to distal thoracic esophagus. Confluent soft tissue within the mediastinum and bilateral hila consistent with adenopathy, not grossly changed since prior study. An enlarged left supraclavicular lymph node on image 9/4 measures 13 mm in short axis, unchanged. Thyroid and trachea are unremarkable. Lungs/Pleura: There are trace bilateral pleural effusions. Since the prior exam, there is progressive near complete consolidation of the left upper lobe, which may reflect progressive disease and/or postobstructive change. There is diffuse bilateral bronchial wall thickening, greatest at the lung bases. Continued right middle lobe collapse with occlusion of the right middle lobe bronchus again noted. Upper Abdomen: There are stable bilateral adrenal nodules, measuring up to 2.2 cm on  the right. Multiple enlarged lymph nodes are again seen within the central upper abdomen, largest measuring 1.7 cm just posterior to the gastric cardia. Multiple hypodensities are again seen within the liver consistent with metastatic disease, grossly unchanged since prior study. Musculoskeletal: Stable sclerotic focus within the left T10 transverse process and within the T9 vertebral body, compatible with bony metastases. No acute displaced fractures. Reconstructed images demonstrate no additional findings. Review of the MIP images confirms the above findings. IMPRESSION: 1. No evidence of pulmonary embolus. 2. Persistent left hilar mass surrounding the left hilar vessels, with increasing near total consolidation of the left upper lobe since prior study. This may be progression of malignancy versus postobstructive change. 3. Continued coalescing lymphadenopathy throughout the mediastinum and bilateral hila. 4. Bilateral bronchial wall thickening, greatest at the lung bases. 5. Trace bilateral pleural effusions. 6. Segmental wall thickening of the mid to distal thoracic esophagus, which could be inflammatory or infectious. 7. Upper abdominal metastases with continued multiple liver lesions, bilateral adrenal nodularity, and upper abdominal adenopathy. Electronically Signed   By: Randa Ngo M.D.   On: 06/07/2021 15:12   NM PET Image Initial (PI) Skull Base To Thigh  Result Date: 06/14/2021 CLINICAL DATA:  Initial treatment strategy for lung nodule. Lymphadenopathy with liver lesions. EXAM: NUCLEAR MEDICINE PET SKULL BASE TO THIGH TECHNIQUE: 12.0 mCi F-18 FDG was injected intravenously. Full-ring PET imaging was performed from the skull base to thigh after the radiotracer. CT data was obtained and used for attenuation correction and anatomic localization. Fasting blood glucose: 122 mg/dl COMPARISON:  CTA Chest 06/07/2021 FINDINGS: Mediastinal blood pool activity: SUV max 0.8 Liver activity: SUV max NA NECK: 12  mm left cervical node on 82/5 is hypermetabolic with SUV max = 4.3. Small hypermetabolic lymph nodes evident right lower neck. Bilateral supraclavicular hypermetabolic lymph nodes including 11 mm left supraclavicular node on 60/3 with SUV max = 7.4. Markedly Markedly hypermetabolic left thyroid nodule measures 1.4 cm on image 60/3 with SUV max = 21.7. Incidental CT findings: none CHEST: Hypermetabolic mediastinal lymphadenopathy evident. Index subcarinal measures 18 mm short axis on 01/02/3 with SUV max = 7.0 hypermetabolic lymph nodes are seen in both hilar regions into the prevascular space. 16 mm prevascular node on 87/3 demonstrates SUV max = 7.0. Marked wall thickening noted  in the mid esophagus, at approximately the level of the carina. Although somewhat difficult to assess given the adjacent hypermetabolic lymphadenopathy, there does appear to be marked hypermetabolism in the wall of this abnormal appearing esophagus with SUV max = 7.7. Areas of collapse/consolidation in the left upper lobe, right middle lobe and infrahilar left lower lobe show substantial hypermetabolism. There is diffuse interstitial opacity in both lungs with innumerable tiny bilateral pulmonary nodules. Incidental CT findings: Trace pericardial effusion. Right Port-A-Cath tip is positioned in the distal SVC. Coronary artery calcification is evident. Mild atherosclerotic calcification is noted in the wall of the thoracic aorta. ABDOMEN/PELVIS: Multiple hypermetabolic liver lesions evident. 18 mm subcapsular lesion anterior left liver on 150/3 demonstrates SUV max = 7.7. Hypermetabolic gastrohepatic ligament lymph nodes measure up to 14 mm on 150/3 with SUV max = 6.6 12 mm left para-aortic node on 163/8 is hypermetabolic with SUV max = 4.4. 2.4 cm right adrenal nodule shows low attenuation, suggesting adenoma with associated hypermetabolism SUV max = 5.3. Incidental CT findings: Prostate gland is enlarged. Scattered diverticuli noted left  colon without diverticulitis. There is mild atherosclerotic calcification of the abdominal aorta without aneurysm. SKELETON: Scattered hypermetabolic bone metastases evident. 12 mm posterior right iliac sclerotic lesion on 02/20 8/3 shows SUV max = 5.6. Sacral metastases demonstrate SUV max = 5.3 subtle 13 mm left femoral neck lesion demonstrates SUV max = 5.3. Incidental CT findings: none IMPRESSION: 1. Similar right middle and left upper lobe collapse with associated marked hypermetabolism. There is diffuse interstitial and micro nodularity in both lungs consistent with metastatic disease. 2. Hypermetabolic metastatic lymphadenopathy in the neck, mediastinum, hilar regions, gastrohepatic ligament, and retroperitoneum. 3. Marked hypermetabolism identified in the mid esophagus with associated esophageal wall thickening. This may be related to secondary involvement although primary esophageal neoplasm not excluded. 4. Hypermetabolic liver and bone metastases. 5. Hypermetabolic right adrenal nodule. This is felt to be likely an adenoma although metastatic involvement is certainly possible. 6. Hypermetabolic left thyroid nodule. Recommend thyroid US and biopsy. (Ref: J Am Coll Radiol. 2015 Feb;12(2): 143-50). 7.  Aortic Atherosclerois (ICD10-170.0) Electronically Signed   By: Misty Stanley M.D.   On: 06/14/2021 12:27   DG Chest Port 1 View  Result Date: 06/14/2021 CLINICAL DATA:  Dyspnea EXAM: PORTABLE CHEST 1 VIEW COMPARISON:  06/13/2021 FINDINGS: Cardiac shadow is stable. Right chest wall port is again seen and stable. Volume loss in the left hemithorax is noted with diffuse increased airspace opacity stable from the previous day. Mild right basilar opacity is noted stable in appearance as well. No new focal abnormality is noted. IMPRESSION: Stable bilateral airspace opacities similar to that seen on recent CT and plain film examination. Electronically Signed   By: Inez Catalina M.D.   On: 06/14/2021 19:39   DG  Chest Port 1 View  Result Date: 06/13/2021 CLINICAL DATA:  Shortness of breath.  Recent pneumonia. EXAM: PORTABLE CHEST 1 VIEW COMPARISON:  Radiograph and chest CT 06/07/2021. FINDINGS: Right chest port remains in place. Similar volume loss in the left hemithorax with diffusely increased opacity, opacity throughout the left upper lobe with seen on recent CT. Opacity at the right lung base corresponds to right middle lobe collapse. Background bronchial thickening. Trace pleural effusions on CT are not well demonstrated by radiograph. No pneumothorax or significant change from prior imaging. Heart size is grossly stable. IMPRESSION: 1. No significant change from prior imaging. 2. Volume loss in the left hemithorax with diffusely increased opacity throughout the left upper  lung zone, upper lobe consolidation seen on recent CT. 3. Right lung base opacity corresponds to right middle lobe collapse. 4. Background bronchial thickening. Electronically Signed   By: Keith Rake M.D.   On: 06/13/2021 18:36   DG Chest Portable 1 View  Result Date: 06/07/2021 CLINICAL DATA:  Cough, shortness of breath. EXAM: PORTABLE CHEST 1 VIEW COMPARISON:  May 14, 2021. FINDINGS: Stable cardiomediastinal silhouette. Interval placement of right internal jugular Port-A-Cath with distal tip in expected position of the SVC. No pneumothorax is noted. Left upper lobe consolidation is noted concerning for pneumonia or atelectasis. Probable central pulmonary vascular congestion is noted. Bony thorax is unremarkable. IMPRESSION: Left upper lobe opacity is noted concerning for pneumonia or atelectasis. Electronically Signed   By: Marijo Conception M.D.   On: 06/07/2021 12:13   ECHOCARDIOGRAM COMPLETE  Result Date: 06/08/2021    ECHOCARDIOGRAM REPORT   Patient Name:   Derek Clarke Date of Exam: 06/08/2021 Medical Rec #:  [id number]      Height:       69.0 in Accession #:    [id number]     Weight:       220.7 lb Date of Birth:  1956/08/08       BSA:          2.154 m Patient Age:    84 years       BP:           130/81 mmHg Patient Gender: M              HR:           108 bpm. Exam Location:  [facility] Procedure: 2D Echo, Cardiac Doppler, Color Doppler and Strain Analysis Indications:    CHF-acute diastolic C37.62  History:        Patient has no prior history of Echocardiogram examinations.                 COPD.  Sonographer:    Sherrie Sport Referring Phys: [id number] Ivor Costa Diagnosing      Kate Sable MD Phys:  Sonographer Comments: Suboptimal apical window. Global longitudinal strain was attempted. IMPRESSIONS  1. Left ventricular ejection fraction, by estimation, is 65 to 70%. The left ventricle has normal function. The left ventricle has no regional wall motion abnormalities. Left ventricular diastolic parameters are consistent with Grade I diastolic dysfunction (impaired relaxation).  2. Right ventricular systolic function is normal. The right ventricular size is mildly enlarged.  3. A small pericardial effusion is present.  4. The mitral valve is normal in structure. No evidence of mitral valve regurgitation.  5. The aortic valve is tricuspid. Aortic valve regurgitation is not visualized. FINDINGS  Left Ventricle: Left ventricular ejection fraction, by estimation, is 65 to 70%. The left ventricle has normal function. The left ventricle has no regional wall motion abnormalities. Global longitudinal strain performed but not reported based on interpreter judgement due to suboptimal tracking. 3D left ventricular ejection fraction analysis performed but not reported based on interpreter judgement due to suboptimal quality. The left ventricular internal cavity size was normal in size. There is no left ventricular hypertrophy. Left ventricular diastolic parameters are consistent with Grade I diastolic dysfunction (impaired relaxation). Right Ventricle: The right ventricular size is mildly enlarged. No increase in right ventricular wall thickness. Right  ventricular systolic function is normal. Left Atrium: Left atrial size was normal in size. Right Atrium: Right atrial size was normal in size. Pericardium: A small pericardial effusion is present.  Mitral Valve: The mitral valve is normal in structure. No evidence of mitral valve regurgitation. Tricuspid Valve: The tricuspid valve is grossly normal. Tricuspid valve regurgitation is trivial. Aortic Valve: The aortic valve is tricuspid. Aortic valve regurgitation is not visualized. Aortic valve mean gradient measures 6.0 mmHg. Aortic valve peak gradient measures 10.5 mmHg. Aortic valve area, by VTI measures 2.89 cm. Pulmonic Valve: The pulmonic valve was not well visualized. Pulmonic valve regurgitation is not visualized. Aorta: The aortic root is normal in size and structure. IAS/Shunts: No atrial level shunt detected by color flow Doppler.  LEFT VENTRICLE PLAX 2D LVIDd:         4.80 cm  Diastology LVIDs:         2.60 cm  LV e' medial:   1.96 cm/s LV PW:         1.30 cm  LV E/e' medial: 38.7 LV IVS:        1.20 cm LVOT diam:     2.10 cm LV SV:         76 LV SV Index:   35 LVOT Area:     3.46 cm 3D Volume EF:                         3D EF:        52 %                         LV EDV:       96 ml                         LV ESV:       46 ml                         LV SV:        50 ml RIGHT VENTRICLE RV S prime:     14.00 cm/s TAPSE (M-mode): 5.7 cm LEFT ATRIUM             Index       RIGHT ATRIUM           Index LA diam:        3.30 cm 1.53 cm/m  RA Area:     21.00 cm LA Vol (A2C):   64.2 ml 29.80 ml/m RA Volume:   55.60 ml  25.81 ml/m LA Vol (A4C):   31.2 ml 14.48 ml/m LA Biplane Vol: 45.4 ml 21.07 ml/m  AORTIC VALVE                    PULMONIC VALVE AV Area (Vmax):    2.39 cm     PV Vmax:       0.73 m/s AV Area (Vmean):   2.78 cm     PV Peak grad:  2.1 mmHg AV Area (VTI):     2.89 cm AV Vmax:           162.00 cm/s AV Vmean:          108.000 cm/s AV VTI:            0.261 m AV Peak Grad:      10.5 mmHg AV Mean  Grad:      6.0 mmHg LVOT Vmax:         112.00 cm/s LVOT Vmean:        86.800 cm/s  LVOT VTI:          0.218 m LVOT/AV VTI ratio: 0.84  AORTA Ao Root diam: 3.30 cm MITRAL VALVE                TRICUSPID VALVE MV Area (PHT): 3.95 cm     TR Peak grad:   30.0 mmHg MV Decel Time: 192 msec     TR Vmax:        274.00 cm/s MV E velocity: 75.80 cm/s MV A velocity: 119.00 cm/s  SHUNTS MV E/A ratio:  0.64         Systemic VTI:  0.22 m                             Systemic Diam: 2.10 cm Kate Sable MD Electronically signed by Kate Sable MD Signature Date/Time: 06/08/2021/3:10:53 PM    Final    IR IMAGING GUIDED PORT INSERTION  Result Date: 06/03/2021 INDICATION: Metastatic lung cancer.  Chemotherapy. EXAM: IMPLANTED PORT A CATH PLACEMENT WITH ULTRASOUND AND FLUOROSCOPIC GUIDANCE MEDICATIONS: None ANESTHESIA/SEDATION: Moderate (conscious) sedation was employed during this procedure. A total of Versed 0 mg and Fentanyl 25 mcg was administered intravenously. Moderate Sedation Time: 20 minutes. The patient's level of consciousness and vital signs were monitored continuously by radiology nursing throughout the procedure under my direct supervision. FLUOROSCOPY TIME:  0 minutes, 24 seconds (4.5 mGy) COMPLICATIONS: None immediate. PROCEDURE: The procedure, risks, benefits, and alternatives were explained to the patient. Questions regarding the procedure were encouraged and answered. The patient understands and consents to the procedure. The neck and chest were prepped with chlorhexidine in a sterile fashion, and a sterile drape was applied covering the operative field. Maximum barrier sterile technique with sterile gowns and gloves were used for the procedure. A timeout was performed prior to the initiation of the procedure. Local anesthesia was provided with 1% lidocaine with epinephrine. After creating a small venotomy incision, a micropuncture kit was utilized to access the internal jugular vein under direct, real-time  ultrasound guidance. Ultrasound image documentation was performed. The microwire was kinked to measure appropriate catheter length. A subcutaneous port pocket was then created along the upper chest wall utilizing a combination of sharp and blunt dissection. The pocket was irrigated with sterile saline. A single lumen ISP power injectable port was chosen for placement. The 8 Fr catheter was tunneled from the port pocket site to the venotomy incision. The port was placed in the pocket. The external catheter was trimmed to appropriate length. At the venotomy, an 8 Fr peel-away sheath was placed over a guidewire under fluoroscopic guidance. The catheter was then placed through the sheath and the sheath was removed. Final catheter positioning was confirmed and documented with a fluoroscopic spot radiograph. The port was accessed with a Huber needle, aspirated and flushed with heparinized saline. The port pocket incision was closed with interrupted 3-0 Vicryl suture then Dermabond was applied, including at the venotomy incision. Dressings were placed. The patient tolerated the procedure well without immediate post procedural complication. IMPRESSION: Successful placement of a right internal jugular approach power injectable Port-A-Cath. The catheter is ready for immediate use. Michaelle Birks, MD [facility] Electronically Signed   By: Michaelle Birks M.D.   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  Patient received cycle 1 of carboplatinum and Taxol on June 16, 2021.  He received Avastin on June 17, 2021.  He appears to have tolerated his treatments well without significant side effects.  His next treatment will occur on July 07, 2021.  No  further intervention is needed from an oncology standpoint.  Appreciate palliative care input. Shortness of breath/COPD exacerbation: Chronic and unchanged.  Patient is at his baseline oxygen requirement of 4 L nasal cannula.  Continue steroids and current antibiotics.   Leukocytosis: Resolved. Difficulty swallowing: Appreciate ENT and speech pathology input. Thrombocytopenia: Mild, likely secondary to chemotherapy. Disposition: Okay to discharge from an oncology standpoint.  Patient has an appointment later this week for laboratory work and hospital follow-up.  His next chemotherapy is as above.  Will follow.  Lloyd Huger, MD   06/20/2021 4:19 PM

## 2021-06-21 ENCOUNTER — Encounter: Payer: Self-pay | Admitting: Oncology

## 2021-06-21 LAB — CBC
HCT: 43.5 % (ref 39.0–52.0)
Hemoglobin: 14.5 g/dL (ref 13.0–17.0)
MCH: 27.5 pg (ref 26.0–34.0)
MCHC: 33.3 g/dL (ref 30.0–36.0)
MCV: 82.5 fL (ref 80.0–100.0)
Platelets: 120 10*3/uL — ABNORMAL LOW (ref 150–400)
RBC: 5.27 MIL/uL (ref 4.22–5.81)
RDW: 12.4 % (ref 11.5–15.5)
WBC: 13.5 10*3/uL — ABNORMAL HIGH (ref 4.0–10.5)
nRBC: 0 % (ref 0.0–0.2)

## 2021-06-21 LAB — BASIC METABOLIC PANEL
Anion gap: 9 (ref 5–15)
BUN: 26 mg/dL — ABNORMAL HIGH (ref 8–23)
CO2: 35 mmol/L — ABNORMAL HIGH (ref 22–32)
Calcium: 8.8 mg/dL — ABNORMAL LOW (ref 8.9–10.3)
Chloride: 91 mmol/L — ABNORMAL LOW (ref 98–111)
Creatinine, Ser: 0.58 mg/dL — ABNORMAL LOW (ref 0.61–1.24)
GFR, Estimated: >60 mL/min (ref >60)
Glucose, Bld: 112 mg/dL — ABNORMAL HIGH (ref 70–99)
Potassium: 3.6 mmol/L (ref 3.5–5.1)
Sodium: 135 mmol/L (ref 135–145)

## 2021-06-21 NOTE — Progress Notes (Signed)
PROGRESS NOTE   HPI was taken from Derek Clarke: Derek Clarke is a 65 y.o. 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    The patient was recently hospitalized at [facility] from 06/07/2021 06/09/2021 for acute on chronic hypoxic respiratory failure in the setting of acute COPD exacerbation.  During his previous hospitalization, he underwent CTA chest on 06/24/2021 which was notable for no evidence of acute pulmonary embolism, but showed persistent left hilar mass, with interval increase in consolidation of the left upper lobe, felt to represent either progression of malignancy versus postobstructive changes, while showing trace bilateral pleural effusions.  He also underwent echocardiogram on 06/08/2021 notable for LVEF 65 to 70%, no focal wall motion maladies, will showing grade 1 diastolic dysfunction, mildly enlarged right ventricle, and small pericardial effusion in the absence of any significant valvular pathology.  The patient's shortness of breath improved over the course of his hospitalization with scheduled duo nebulizers and systemic corticosteroids, with the patient subsequently discharged to home on his baseline 4 L nasal cannula.  He was also discharged on a 3-day course of prednisone as well as a Z-Pak.  In terms of modifications to his home respiratory regimen that were made during this previous hospitalization, he was instructed to stop taking his home Pulmicort and initiate Breo Ellipta.   The patient presents back to [facility] ED today complaining of 1 to 2 days of progressive shortness of breath, after noting some initial continued improvement in his respiratory status  following discharge from the hospital on 06/09/2021.  He reports that he compliantly completed his 3-day course of prednisone 50 mg p.o. daily, and noted that his worsening shortness of breath started 1 to 2 days of completing the steroid course.  He also conveys that he has not yet picked up his new prescription for [facility].  He notes chronic baseline nonproductive cough in the absence of any hemoptysis.  Denies any associated wheezing.  Also denies any recent orthopnea, PND.  Reports increase in edema in the bilateral lower extremities over the last 3 to 4 days, but denies any associated calf tenderness or new lower extremity erythema.  Denies any associated chest pain, palpitations, diaphoresis, nausea, vomiting.  Denies any associated subjective fever, chills, rigors, or generalized myalgias.   In the setting of his stage IV adenocarcinoma of the left lung with liver metastasis, undergoing chemotherapy, the patient underwent PET scan earlier today, with associated read currently pending.  He confirms that he is a former smoker, having quit smoking in July 2022 after a prolonged smoking history.   Per chart review, notable prior labs include white blood cell count on previous day of admission, 06/07/2021, noted to be 10.1, with interval increase to 11.8 on 06/08/2021 following interval initiation of steroids.   Hospital course from Dr. Jimmye Norman 9/21-9/27/22: Pt presented w/ acute on chronic hypoxic respiratory failure secondary to COPD exacerbation, lung cancer & possible postobstructive pneumonia. Pt has completed his course of abxs but still on steroid taper, bronchodilators and incentive spirometry & flutter valve. Of note, pt has had poor po intake and is on dysphagia I as per speech and pt completed MBSS yesterday which confirmed the pt was aspirating. Speech & RD  recommend PEG tube placement. I discussed w/ pt in detail about the PEG tube and why the pt needs it. Pt is unsure of what he wants to do  and wants to think about it more today and then revisit the issue tomorrow.     Derek Clarke  JOI:[id number] DOB: 1955-10-31 DOA: 06/13/2021 PCP: Pcp, No   Assessment & Plan:   Principal Problem:   Acute on chronic respiratory failure with hypoxia (HCC) Active Problems:   COPD with acute exacerbation (HCC)   SOB (shortness of breath)   Elevated troponin   Dysphagia   GAD (generalized anxiety disorder)  Acute on chronic hypoxic respiratory failure: likely multifactorial including COPD exacerbation, lung cancer & possible postobstructive pneumonia.  Continue on supplemental oxygen & back to baseline of 4L [place]. Completed abx course. Continue on steroid taper, bronchodilators & encourage incentive spirometry & flutter valves   Possible postobstructive pneumonia: completed abx course. Continue on steroid taper & bronchodilators   Chronic diastolic CHF: not on any diuretics at home as per med rec. Will continue on lasix (started 9/26). Will continue to monitor I/Os  Constipation: continue on colace, lactulose   Adenocarcinoma of the lung: stage IV. Last chemo 06/17/21. Next chemo will be in 3 weeks as per onco. Etiology of pt's hoarseness as per ENT   Thrombocytopenia: likely secondary to recent chemo   Dysphagia: likely secondary to lung cancer. Continue on PPI. MBSS confirms aspiration. Discussed PEG tube placement in detail with pt today and pt wants to think it over and then discuss tomorrow    Generalized anxiety disorder: severity unknown. Ativan prn   Obesity: BMI 30.6. Complicates overall care & prognosis    DVT prophylaxis: lovenox  Code Status: full  Family Communication:  Disposition Plan: d/c back home   Level of care: Progressive Cardiac  Status is: Inpatient  Remains inpatient appropriate because:Ongoing diagnostic testing needed not appropriate for outpatient work up, IV treatments appropriate due to intensity of illness or inability to take PO, and Inpatient  level of care appropriate due to severity of illness  Dispo:  Patient From: Home  Planned Disposition: To be determined  Medically stable for discharge: No       Consultants:  onco  Procedures:   Antimicrobials:    Subjective: Pt c/o malaise   Objective: Vitals:   06/20/21 1948 06/20/21 2310 06/21/21 0358 06/21/21 0725  BP:  119/83 112/80 108/75  Pulse:  61 86 90  Resp:  18 19 18   Temp:  98.2 F (36.8 C) 98.2 F (36.8 C) 98.3 F (36.8 C)  TempSrc:    Oral  SpO2: 99% 100% 98% 100%  Weight:   91.2 kg   Height:        Intake/Output Summary (Last 24 hours) at 06/21/2021 0735 Last data filed at 06/20/2021 1837 Gross per 24 hour  Intake 840 ml  Output 650 ml  Net 190 ml    Filed Weights   06/19/21 0544 06/20/21 0533 06/21/21 0358  Weight: 93.8 kg 94 kg 91.2 kg    Examination:  General exam: Appears calm but uncomfortable   Respiratory system: diminished breath sounds b/l  Cardiovascular system: S1/S2+. No rubs or clicks  Gastrointestinal system: Abd is soft, NT, ND, & hypoactive bowel sounds  Central nervous system: Alert and oriented. Moves all extremities  Psychiatry: Judgement and insight appear normal. Flat mood and affect    Data Reviewed: I have personally reviewed following labs and imaging studies  CBC: Recent Labs  Lab 06/17/21 0420 06/18/21 0900 06/19/21 0416 06/20/21 0453 06/21/21 0320  WBC 11.6* 10.4 10.4 10.1 13.5*  HGB 14.3 14.5 14.0 14.2 14.5  HCT 42.6 41.9 41.6 43.3 43.5  MCV 82.2 83.5 84.0 82.6 82.5  PLT 212 165 159 132* 423*   Basic Metabolic Panel: Recent Labs  Lab 06/17/21 0420 06/18/21 0900 06/19/21 0416 06/20/21 0453 06/21/21 0320  NA 134* 136 138 134* 135  K 3.4* 3.9 4.1 3.9 3.6  CL 94* 94* 94* 92* 91*  CO2 30 32 35* 32 35*  GLUCOSE 117* 116* 118* 116* 112*  BUN 19 19 23  24* 26*  CREATININE 0.61 0.59* 0.64 0.61 0.58*  CALCIUM 8.9 9.0 9.0 8.9 8.8*   GFR: Estimated Creatinine Clearance: 104.1 mL/min (A) (by  C-G formula based on SCr of 0.58 mg/dL (L)). Liver Function Tests: No results for input(s): AST, ALT, ALKPHOS, BILITOT, PROT, ALBUMIN in the last 168 hours.  No results for input(s): LIPASE, AMYLASE in the last 168 hours. No results for input(s): AMMONIA in the last 168 hours. Coagulation Profile: No results for input(s): INR, PROTIME in the last 168 hours. Cardiac Enzymes: No results for input(s): CKTOTAL, CKMB, CKMBINDEX, TROPONINI in the last 168 hours. BNP (last 3 results) No results for input(s): PROBNP in the last 8760 hours. HbA1C: No results for input(s): HGBA1C in the last 72 hours. CBG: No results for input(s): GLUCAP in the last 168 hours.  Lipid Profile: No results for input(s): CHOL, HDL, LDLCALC, TRIG, CHOLHDL, LDLDIRECT in the last 72 hours. Thyroid Function Tests: No results for input(s): TSH, T4TOTAL, FREET4, T3FREE, THYROIDAB in the last 72 hours. Anemia Panel: No results for input(s): VITAMINB12, FOLATE, FERRITIN, TIBC, IRON, RETICCTPCT in the last 72 hours. Sepsis Labs: No results for input(s): PROCALCITON, LATICACIDVEN in the last 168 hours.   Recent Results (from the past 240 hour(s))  Resp Panel by RT-PCR (Flu A&B, Covid) Nasopharyngeal Swab     Status: None   Collection Time: 06/13/21  8:27 PM   Specimen: Nasopharyngeal Swab; Nasopharyngeal(NP) swabs in vial transport medium  Result Value Ref Range Status   SARS Coronavirus 2 by RT PCR NEGATIVE NEGATIVE Final    Comment: (NOTE) SARS-CoV-2 target nucleic acids are NOT DETECTED.  The SARS-CoV-2 RNA is generally detectable in upper respiratory specimens during the acute phase of infection. The lowest concentration of SARS-CoV-2 viral copies this assay can detect is 138 copies/mL. A negative result does not preclude SARS-Cov-2 infection and should not be used as the sole basis for treatment or other patient management decisions. A negative result may occur with  improper specimen collection/handling,  submission of specimen other than nasopharyngeal swab, presence of viral mutation(s) within the areas targeted by this assay, and inadequate number of viral copies(<138 copies/mL). A negative result must be combined with clinical observations, patient history, and epidemiological information. The expected result is Negative.  Fact Sheet for Patients:  EntrepreneurPulse.com.au  Fact Sheet for Healthcare Providers:  IncredibleEmployment.be  This test is no t yet approved or cleared by the [place] FDA and  has been authorized for detection and/or diagnosis of SARS-CoV-2 by FDA under an Emergency Use Authorization (EUA). This EUA will remain  in effect (meaning this test can be used) for the duration of the COVID-19 declaration under Section 564(b)(1) of the Act, 21 U.S.C.section 360bbb-3(b)(1), unless the authorization is terminated  or revoked sooner.       Influenza A by PCR NEGATIVE NEGATIVE Final   Influenza B by PCR NEGATIVE  NEGATIVE Final    Comment: (NOTE) The Xpert Xpress SARS-CoV-2/FLU/RSV plus assay is intended as an aid in the diagnosis of influenza from Nasopharyngeal swab specimens and should not be used as a sole basis for treatment. Nasal washings and aspirates are unacceptable for Xpert Xpress SARS-CoV-2/FLU/RSV testing.  Fact Sheet for Patients: EntrepreneurPulse.com.au  Fact Sheet for Healthcare Providers: IncredibleEmployment.be  This test is not yet approved or cleared by the [place] FDA and has been authorized for detection and/or diagnosis of SARS-CoV-2 by FDA under an Emergency Use Authorization (EUA). This EUA will remain in effect (meaning this test can be used) for the duration of the COVID-19 declaration under Section 564(b)(1) of the Act, 21 U.S.C. section 360bbb-3(b)(1), unless the authorization is terminated or revoked.  Performed at [facility], [address] [place], [place] [address]          Radiology Studies: No results found.      Scheduled Meds:  Chlorhexidine Gluconate Cloth  6 each Topical Daily   docusate sodium  200 mg Oral BID   enoxaparin (LOVENOX) injection  0.5 mg/kg Subcutaneous Q24H   feeding supplement (NEPRO CARB STEADY)  237 mL Oral TID BM   furosemide  40 mg Intravenous Daily   guaiFENesin  600 mg Oral BID   ipratropium-albuterol  3 mL Nebulization Q6H   lactulose  20 g Oral BID   methylPREDNISolone (SOLU-MEDROL) injection  40 mg Intravenous Q12H   pantoprazole (PROTONIX) IV  40 mg Intravenous Daily   Continuous Infusions:  promethazine (PHENERGAN) injection (IM or IVPB)        LOS: 8 days    Time spent: 30 mins     Wyvonnia Dusky, MD [facility] Pager [phone]  If 7PM-7AM, please contact night-coverage 06/21/2021, 7:35 AM

## 2021-06-22 DIAGNOSIS — I509 Heart failure, unspecified: Secondary | ICD-10-CM

## 2021-06-22 DIAGNOSIS — E440 Moderate protein-calorie malnutrition: Secondary | ICD-10-CM

## 2021-06-22 LAB — BASIC METABOLIC PANEL
Anion gap: 10 (ref 5–15)
BUN: 26 mg/dL — ABNORMAL HIGH (ref 8–23)
CO2: 34 mmol/L — ABNORMAL HIGH (ref 22–32)
Calcium: 8.7 mg/dL — ABNORMAL LOW (ref 8.9–10.3)
Chloride: 89 mmol/L — ABNORMAL LOW (ref 98–111)
Creatinine, Ser: 0.51 mg/dL — ABNORMAL LOW (ref 0.61–1.24)
GFR, Estimated: >60 mL/min (ref >60)
Glucose, Bld: 120 mg/dL — ABNORMAL HIGH (ref 70–99)
Potassium: 3.6 mmol/L (ref 3.5–5.1)
Sodium: 133 mmol/L — ABNORMAL LOW (ref 135–145)

## 2021-06-22 LAB — CBC
HCT: 43.3 % (ref 39.0–52.0)
Hemoglobin: 14.4 g/dL (ref 13.0–17.0)
MCH: 27.6 pg (ref 26.0–34.0)
MCHC: 33.3 g/dL (ref 30.0–36.0)
MCV: 83 fL (ref 80.0–100.0)
Platelets: 96 10*3/uL — ABNORMAL LOW (ref 150–400)
RBC: 5.22 MIL/uL (ref 4.22–5.81)
RDW: 12.4 % (ref 11.5–15.5)
WBC: 7.7 10*3/uL (ref 4.0–10.5)
nRBC: 0 % (ref 0.0–0.2)

## 2021-06-22 MED ORDER — ENOXAPARIN SODIUM 40 MG/0.4ML IJ SOSY: 40.0000 mg | PREFILLED_SYRINGE | INTRAMUSCULAR | Status: DC

## 2021-06-22 NOTE — Progress Notes (Signed)
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 Note: pt with two recent admissions with similar presentation.    PT Comments    Pt was pleasant and motivated to participate during the session. Pt was able to ambulate a max of 30 feet before requiring to return to sitting with mod SOB with SpO2 97-98% and HR in the low 100s on 4LO2/min.  Pt put forth good effort during below standing balance training and was able to self-correct instability caused by higher-level training.  Pt will benefit from continued PT services while in acute care to prevent loss of function.     Recommendations for follow up therapy are one component of a multi-disciplinary discharge planning process, led by the attending physician.  Recommendations may be updated based on patient status, additional functional criteria and insurance authorization.  Follow Up Recommendations        Equipment Recommendations  None recommended by PT    Recommendations for Other Services       Precautions / Restrictions Precautions Precautions: None Precaution Comments: Metastatic disease Restrictions Weight Bearing Restrictions: No     Mobility  Bed Mobility               General bed mobility comments: NT, pt in recliner    Transfers Overall transfer level: Independent Equipment used: None Transfers: Sit to/from Stand Sit to Stand: Independent         General transfer comment: Good control and stability  Ambulation/Gait Ambulation/Gait  assistance: Independent Gait Distance (Feet): 30 Feet Assistive device: None Gait Pattern/deviations: Step-through pattern Gait velocity: mildly decreased   General Gait Details: Slow cadence with short B step length but steady without LOB with SpO2 and HR WNL on 4LO2/min   Stairs             Wheelchair Mobility    Modified Rankin (Stroke Patients Only)       Balance                                            Cognition Arousal/Alertness: Awake/alert Behavior During Therapy: WFL for tasks assessed/performed Overall Cognitive Status: Within Functional Limits for tasks assessed                                        Exercises Other Exercises Other Exercises: Dynamic standing balance training without UE support with reaching activities outside BOS with feet apart, together, and semi-tandem Other Exercises: Static standing balance training without UE support with feet together and semi-tandem with combinations of head turns/head still and eyes open/closed    General Comments General comments (skin integrity, edema, etc.): Pt able to maintain tandem stance x 8 sec, SLS x 3-5 sec bilaterally      Pertinent Vitals/Pain Pain Assessment: No/denies pain    Home Living  Prior Function            PT Goals (current goals can now be found in the care plan section) Progress towards PT goals: Progressing toward goals    Frequency    Min 2X/week      PT Plan Current plan remains appropriate    Co-evaluation              AM-PAC PT "6 Clicks" Mobility   Outcome Measure  Help needed turning from your back to your side while in a flat bed without using bedrails?: None Help needed moving from lying on your back to sitting on the side of a flat bed without using bedrails?: None Help needed moving to and from a bed to a chair (including a wheelchair)?: None Help needed standing up from a chair using  your arms (e.g., wheelchair or bedside chair)?: None Help needed to walk in hospital room?: None Help needed climbing 3-5 steps with a railing? : None 6 Click Score: 24    End of Session Equipment Utilized During Treatment: Oxygen Activity Tolerance: Patient tolerated treatment well Patient left: in chair;with call bell/phone within reach Nurse Communication: Mobility status PT Visit Diagnosis: Difficulty in walking, not elsewhere classified (R26.2)     Time: 3762-8315 PT Time Calculation (min) (ACUTE ONLY): 13 min  Charges:  $Therapeutic Exercise: 8-22 mins                    D. Scott Paschal Blanton PT, DPT 06/22/21, 3:10 PM

## 2021-06-22 NOTE — Progress Notes (Addendum)
Mobility Specialist - Progress Note   06/22/21 1500  Mobility  Activity Refused mobility  Mobility performed by Mobility specialist    Pt politely declined mobility this date. States he just finished working with PT and would like to rest at this time. Will attempt another date. Family at bedside.    Kathee Delton Mobility Specialist 06/22/21, 3:02 PM

## 2021-06-22 NOTE — Progress Notes (Signed)
PROGRESS NOTE   Brief summary: Pt presented w/ acute on chronic hypoxic respiratory failure secondary to COPD exacerbation, lung cancer & possible postobstructive pneumonia. Pt has completed his course of abxs but still on steroid taper, bronchodilators and incentive spirometry & flutter valve. Of note, pt has had poor po intake and is on dysphagia I as per speech and pt completed MBSS yesterday which confirmed the pt was aspirating. Speech & RD recommend PEG tube placement. I discussed w/ pt in detail about the PEG tube and why the pt needs it.  He was agreeable to have this placed.  IR consulted.    Derek Clarke  ATF:[id number] DOB: December 23, 1955 DOA: 06/13/2021 PCP: Pcp, No   Assessment & Plan:   Principal Problem:   Acute on chronic respiratory failure with hypoxia (HCC) Active Problems:   COPD with acute exacerbation (HCC)   SOB (shortness of breath)   Elevated troponin   Dysphagia   GAD (generalized anxiety disorder)   Malnutrition of moderate degree  Acute on chronic hypoxic respiratory failure: likely multifactorial including COPD exacerbation, lung cancer & possible postobstructive pneumonia.  Continue on supplemental oxygen & back to baseline of 4L [place]. Completed abx course. Continue on steroid taper, bronchodilators & encourage incentive spirometry & flutter valves   Possible postobstructive pneumonia: completed abx course. Continue on steroid taper & bronchodilators   Chronic diastolic CHF: not on any diuretics at home as per med rec. Will continue on lasix (started 9/26). 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 Last chemo 06/17/21. Next chemo will be in 3 weeks as per onco. Etiology of pt's hoarseness as per ENT   Thrombocytopenia: likely secondary to recent chemo.  Hold further Lovenox  Dysphagia: likely secondary to lung cancer. Continue on PPI. MBSS confirms  aspiration. Discussed PEG tube placement in detail with pt today and he is agreeable for placement.  We will keep patient NPO after midnight.  IR consulted.   Generalized anxiety disorder: severity unknown. Ativan prn   Obesity: BMI 30.6. Complicates overall care & prognosis    DVT prophylaxis: SCDs Code Status: full  Family Communication:  Disposition Plan: d/c back home   Level of care: Progressive Cardiac  Status is: Inpatient  Remains inpatient appropriate because:Ongoing diagnostic testing needed not appropriate for outpatient work up, IV treatments appropriate due to intensity of illness or inability to take PO, and Inpatient level of care appropriate due to severity of illness  Dispo:  Patient From: Home  Planned Disposition: To be determined  Medically stable for discharge: No       Consultants:  onco  Procedures:   Antimicrobials:    Subjective: Patient is nervous about PEG tube placement, but does agree to have this placed tomorrow if possible.  He is anxious to discharge home.  Objective: Vitals:   06/22/21 1108 06/22/21 1113 06/22/21 1317 06/22/21 1521  BP: 123/81 95/62  106/70  Pulse: (!) 103 67  85  Resp: 18 18  18   Temp:  (!) 97.5 F (36.4 C)  98 F (36.7 C)  TempSrc:    Oral  SpO2: 95% 90% 96% 96%  Weight:      Height:        Intake/Output Summary (Last 24 hours) at 06/22/2021 1956 Last data filed at 06/22/2021 1050 Gross per 24 hour  Intake 120 ml  Output --  Net 120 ml  Filed Weights   06/19/21 0544 06/20/21 0533 06/21/21 0358  Weight: 93.8 kg 94 kg 91.2 kg    Examination:  General exam: Appears calm but uncomfortable   Respiratory system: diminished breath sounds b/l  Cardiovascular system: S1/S2+. No rubs or clicks  Gastrointestinal system: Abd is soft, NT, ND, & hypoactive bowel sounds  Central nervous system: Alert and oriented. Moves all extremities  Psychiatry: Judgement and insight appear normal. Flat mood and  affect    Data Reviewed: I have personally reviewed following labs and imaging studies  CBC: Recent Labs  Lab 06/18/21 0900 06/19/21 0416 06/20/21 0453 06/21/21 0320 06/22/21 0331  WBC 10.4 10.4 10.1 13.5* 7.7  HGB 14.5 14.0 14.2 14.5 14.4  HCT 41.9 41.6 43.3 43.5 43.3  MCV 83.5 84.0 82.6 82.5 83.0  PLT 165 159 132* 120* 96*   Basic Metabolic Panel: Recent Labs  Lab 06/18/21 0900 06/19/21 0416 06/20/21 0453 06/21/21 0320 06/22/21 0331  NA 136 138 134* 135 133*  K 3.9 4.1 3.9 3.6 3.6  CL 94* 94* 92* 91* 89*  CO2 32 35* 32 35* 34*  GLUCOSE 116* 118* 116* 112* 120*  BUN 19 23 24* 26* 26*  CREATININE 0.59* 0.64 0.61 0.58* 0.51*  CALCIUM 9.0 9.0 8.9 8.8* 8.7*   GFR: Estimated Creatinine Clearance: 104.1 mL/min (A) (by C-G formula based on SCr of 0.51 mg/dL (L)). Liver Function Tests: No results for input(s): AST, ALT, ALKPHOS, BILITOT, PROT, ALBUMIN in the last 168 hours.  No results for input(s): LIPASE, AMYLASE in the last 168 hours. No results for input(s): AMMONIA in the last 168 hours. Coagulation Profile: No results for input(s): INR, PROTIME in the last 168 hours. Cardiac Enzymes: No results for input(s): CKTOTAL, CKMB, CKMBINDEX, TROPONINI in the last 168 hours. BNP (last 3 results) No results for input(s): PROBNP in the last 8760 hours. HbA1C: No results for input(s): HGBA1C in the last 72 hours. CBG: No results for input(s): GLUCAP in the last 168 hours.  Lipid Profile: No results for input(s): CHOL, HDL, LDLCALC, TRIG, CHOLHDL, LDLDIRECT in the last 72 hours. Thyroid Function Tests: No results for input(s): TSH, T4TOTAL, FREET4, T3FREE, THYROIDAB in the last 72 hours. Anemia Panel: No results for input(s): VITAMINB12, FOLATE, FERRITIN, TIBC, IRON, RETICCTPCT in the last 72 hours. Sepsis Labs: No results for input(s): PROCALCITON, LATICACIDVEN in the last 168 hours.   Recent Results (from the past 240 hour(s))  Resp Panel by RT-PCR (Flu A&B,  Covid) Nasopharyngeal Swab     Status: None   Collection Time: 06/13/21  8:27 PM   Specimen: Nasopharyngeal Swab; Nasopharyngeal(NP) swabs in vial transport medium  Result Value Ref Range Status   SARS Coronavirus 2 by RT PCR NEGATIVE NEGATIVE Final    Comment: (NOTE) SARS-CoV-2 target nucleic acids are NOT DETECTED.  The SARS-CoV-2 RNA is generally detectable in upper respiratory specimens during the acute phase of infection. The lowest concentration of SARS-CoV-2 viral copies this assay can detect is 138 copies/mL. A negative result does not preclude SARS-Cov-2 infection and should not be used as the sole basis for treatment or other patient management decisions. A negative result may occur with  improper specimen collection/handling, submission of specimen other than nasopharyngeal swab, presence of viral mutation(s) within the areas targeted by this assay, and inadequate number of viral copies(<138 copies/mL). A negative result must be combined with clinical observations, patient history, and epidemiological information. The expected result is Negative.  Fact Sheet for Patients:  EntrepreneurPulse.com.au  Fact Sheet  for Healthcare Providers:  IncredibleEmployment.be  This test is no t yet approved or cleared by the [place] and  has been authorized for detection and/or diagnosis of SARS-CoV-2 by FDA under an Emergency Use Authorization (EUA). This EUA will remain  in effect (meaning this test can be used) for the duration of the COVID-19 declaration under Section 564(b)(1) of the Act, 21 U.S.C.section 360bbb-3(b)(1), unless the authorization is terminated  or revoked sooner.       Influenza A by PCR NEGATIVE NEGATIVE Final   Influenza B by PCR NEGATIVE NEGATIVE Final    Comment: (NOTE) The Xpert Xpress SARS-CoV-2/FLU/RSV plus assay is intended as an aid in the diagnosis of influenza from Nasopharyngeal swab specimens and should  not be used as a sole basis for treatment. Nasal washings and aspirates are unacceptable for Xpert Xpress SARS-CoV-2/FLU/RSV testing.  Fact Sheet for Patients: EntrepreneurPulse.com.au  Fact Sheet for Healthcare Providers: IncredibleEmployment.be  This test is not yet approved or cleared by the [place] FDA and has been authorized for detection and/or diagnosis of SARS-CoV-2 by FDA under an Emergency Use Authorization (EUA). This EUA will remain in effect (meaning this test can be used) for the duration of the COVID-19 declaration under Section 564(b)(1) of the Act, 21 U.S.C. section 360bbb-3(b)(1), unless the authorization is terminated or revoked.  Performed at [facility], [address], [place], [place] [address]          Radiology Studies: No results found.      Scheduled Meds:  Chlorhexidine Gluconate Cloth  6 each Topical Daily   docusate sodium  200 mg Oral BID   feeding supplement (NEPRO CARB STEADY)  237 mL Oral TID BM   furosemide  40 mg Intravenous Daily   guaiFENesin  600 mg Oral BID   ipratropium-albuterol  3 mL Nebulization Q6H   lactulose  20 g Oral BID   methylPREDNISolone (SOLU-MEDROL) injection  40 mg Intravenous Q12H   pantoprazole (PROTONIX) IV  40 mg Intravenous Daily   Continuous Infusions:      LOS: 9 days    Time spent: 30 mins     Kathie Dike, MD [facility] Pager [phone]  If 7PM-7AM, please contact night-coverage 06/22/2021, 7:56 PM

## 2021-06-22 NOTE — Progress Notes (Signed)
PHARMACIST - PHYSICIAN COMMUNICATION  CONCERNING:  Enoxaparin (Lovenox) for DVT Prophylaxis    RECOMMENDATION: Patient was prescribed enoxaparin 47.5mg  q24 hours for VTE prophylaxis.   Filed Weights   06/19/21 0544 06/20/21 0533 06/21/21 0358  Weight: 93.8 kg (206 lb 14.4 oz) 94 kg (207 lb 4.8 oz) 91.2 kg (201 lb)    Body mass index is 29.68 kg/m.  Estimated Creatinine Clearance: 104.1 mL/min (A) (by C-G formula based on SCr of 0.51 mg/dL (L)).   Based on [place] patient is candidate for enoxaparin 40mg  SQ every 24 hours based on BMI being <30 and CrCl >16ml/min.  DESCRIPTION: Pharmacy has adjusted enoxaparin dose per [facility] policy.  Patient is now receiving enoxaparin 40 mg every 24 hours    Zahria Ding O Lillia Lengel 06/22/2021 10:30 AM

## 2021-06-22 NOTE — Progress Notes (Signed)
Nutrition Follow-up  DOCUMENTATION CODES:  Non-severe (moderate) malnutrition in context of chronic illness  INTERVENTION:  Nepro Shake po TID, each supplement provides 425 kcal and 19 grams protein  Vital Cuisine TID, each supplement provides 520kcal and 22g of protein.   Magic cup TID with meals, each supplement provides 290 kcal and 9 grams of protein  MVI po daily   Recommend consideration of G-tube placement If G-tube placed, recommend: Osmolite 1.5- Give 6 cartons daily - Start with 1/2 carton 6 times daily and advance slowly as tolerated. Flush with 41m of water before and after each feed.  Pro-Source 457mTID via tube, provides 40kcal and 11g of protein per serving  Regimen provides 2250kcal/day, 122g/day protein and 180627may free water   NUTRITION DIAGNOSIS:  Moderate Malnutrition (in the context of chronic illness) related to dysphagia, inability to eat as evidenced by mild muscle depletion, mild fat depletion, percent weight loss.  GOAL:  Patient will meet greater than or equal to 90% of their needs  MONITOR:  Diet advancement, Labs, Weight trends, Skin, I & O's  REASON FOR ASSESSMENT:  Malnutrition Screening Tool    ASSESSMENT:  [age]o. male with medical history significant for stage IV adenocarcinoma of the left lung with metastasis to the liver, chronic hypoxic respiratory failure on 4 L continuous nasal cannula and COPD who is [facility]21 with acute on chronic hypoxic respiratory failure after presenting from home to [facility] complaining of shortness of breath.  Pt underwent MBS this week which showed aspiration. Pt currently on a DYS 1 with nectar thick liquids. PEG tube placement was discussed with pt by MD yesterday.  Pt in bedside chair at the time of assessment, family at bedside. Inquired if pt had any questions about PEG after discussion with MD yesterday. Pt reports that he is not interested at this point and  would like to try oral intake. Upon further discussion, pt had several misconceptions about the tube. Talked about how pt would still be able to consume the oral diet he is on now and that tube would be on his abdomen, not in his throat or nose. Encouraged pt to think of the tube as a part of his treatment to ensure that his nutrition needs were being met during chemo. Talked about the increased nutrition needs associated with cancer and treatment and the importance of maintaining lean muscle mass and maintaining weight. Pt seemed to be considering tube and interested in further discussion. Encouraged pt to continue to engage discussion with providers and to be proactive and realistic if nutrition needs could not be met orally.   Average Meal Intake: 9/25-9/28: 81% intake x 4 recorded meals  Nutritionally Relevant Medications: Scheduled Meds:  docusate sodium  200 mg Oral BID   feeding supplement (NEPRO CARB STEADY)  237 mL Oral TID BM   furosemide  40 mg Intravenous Daily   lactulose  20 g Oral BID   methylPREDNISolone (SOLU-MEDROL) injection  40 mg Intravenous Q12H   pantoprazole (PROTONIX) IV  40 mg Intravenous Daily   PRN Meds: alum & mag hydroxide-simeth, diphenhydrAMINE, ondansetron  Labs Reviewed: Na 133 / Chloride 89 BUN 26, creatinine .51 6UTRITION - FOCUSED PHYSICAL EXAM: Flowsheet Row Most Recent Value  Orbital Region No depletion  Upper Arm Region Mild depletion  Thoracic and Lumbar Region Mild depletion  Buccal Region No depletion  Temple Region Mild depletion  Clavicle Bone Region Mild depletion  Clavicle and Acromion Bone  Region Mild depletion  Scapular Bone Region Mild depletion  Dorsal Hand No depletion  Patellar Region No depletion  Anterior Thigh Region No depletion  Posterior Calf Region No depletion  Edema (RD Assessment) Mild  Hair Reviewed  Eyes Reviewed  Mouth Reviewed  Skin Reviewed  Nails Reviewed   Diet Order:   Diet Order             DIET -  DYS 1 Room service appropriate? Yes with Assist; Fluid consistency: Nectar Thick  Diet effective now                  EDUCATION NEEDS:  Education needs have been addressed  Skin:  Skin Assessment: Reviewed RN Assessment  Last BM:  9/26  Height:  Ht Readings from Last 1 Encounters:  06/15/21 '5\' 9"'  (1.753 m)   Weight:  Wt Readings from Last 1 Encounters:  06/21/21 91.2 kg   Ideal Body Weight:  72.7 kg  BMI:  Body mass index is 29.68 kg/m.  Estimated Nutritional Needs:  Kcal:  2100-2400kcal/day Protein:  105-120g/day Fluid:  2.2-2.5L/day  Ranell Patrick, RD, LDN Clinical Dietitian Pager on [place]

## 2021-06-23 ENCOUNTER — Encounter: Payer: Self-pay | Admitting: Oncology

## 2021-06-23 ENCOUNTER — Inpatient Hospital Stay: Payer: Self-pay

## 2021-06-23 ENCOUNTER — Inpatient Hospital Stay: Payer: Non-veteran care | Admitting: Oncology

## 2021-06-23 ENCOUNTER — Inpatient Hospital Stay: Payer: Non-veteran care

## 2021-06-23 MED ORDER — PREDNISONE 10 MG PO TABS: 5.0000 mg | ORAL_TABLET | Freq: Every day | ORAL | Status: DC

## 2021-06-23 MED ORDER — PREDNISONE 10 MG PO TABS: 10.0000 mg | ORAL_TABLET | Freq: Every day | ORAL | Status: DC

## 2021-06-23 MED ORDER — PREDNISONE 20 MG PO TABS
40.0000 mg | ORAL_TABLET | Freq: Every day | ORAL | Status: AC
  Administered 2021-06-24 – 2021-06-27 (×4): 40 mg via ORAL
  Filled 2021-06-23 (×4): qty 2

## 2021-06-23 MED ORDER — PREDNISONE 20 MG PO TABS
30.0000 mg | ORAL_TABLET | Freq: Every day | ORAL | Status: AC
  Administered 2021-06-29 – 2021-07-01 (×3): 30 mg via ORAL
  Filled 2021-06-23 (×4): qty 1

## 2021-06-23 MED ORDER — PREDNISONE 20 MG PO TABS
20.0000 mg | ORAL_TABLET | Freq: Every day | ORAL | Status: DC
  Administered 2021-07-02: 20 mg via ORAL
  Filled 2021-06-23: qty 1

## 2021-06-23 NOTE — Progress Notes (Signed)
  Request seen for IR placement of a gastrostomy tube due to patient aspirating.  He is s/p placement of a tunneled cath wit Port on 06/03/21 for chemotherapy for Stage IVb adenocarcinoma of the lung with liver metastasis. He did not do well during conscious sedation and was extremely agitated.   He is now admitted with COPD exacerbation. He is chronically on 4 L O2 via [place].  He is also on antibiotics and steroids.  After image review and chart review by myself and Dr. Kathlene Cote, Mr. Loughney remains very high risk for conscious sedation for gastrostomy tube placement.  Recommend placement of a Cortrack for now until there is some improvement in respiratory status. He may need pulmonary clearance prior to any sedation.  Quinterrius Errington S Abdikadir Fohl PA-C 06/23/2021 2:35 PM

## 2021-06-23 NOTE — Progress Notes (Signed)
PROGRESS NOTE   Brief summary: Pt presented w/ acute on chronic hypoxic respiratory failure secondary to COPD exacerbation, lung cancer & possible postobstructive pneumonia. Pt has completed his course of abxs but still on steroid taper, bronchodilators and incentive spirometry & flutter valve. Of note, pt has had poor po intake and is on dysphagia I as per speech and pt completed MBSS yesterday which confirmed the pt was aspirating. Speech & RD recommend PEG tube placement. I discussed w/ pt in detail about the PEG tube and why the pt needs it.  He was agreeable to have this placed.  IR consulted.    Derek Clarke  ULA:[id number] DOB: 1956-01-02 DOA: 06/13/2021 PCP: Pcp, No   Assessment & Plan:   Principal Problem:   Acute on chronic respiratory failure with hypoxia (HCC) Active Problems:   COPD with acute exacerbation (HCC)   SOB (shortness of breath)   Elevated troponin   Dysphagia   GAD (generalized anxiety disorder)   Malnutrition of moderate degree  Acute on chronic hypoxic respiratory failure: likely multifactorial including COPD exacerbation, lung cancer & possible postobstructive pneumonia.  Continue on supplemental oxygen & back to baseline of 4L [place]. Completed abx course. Continue on steroid taper, bronchodilators & encourage incentive spirometry & flutter valves   Possible postobstructive pneumonia: completed abx course. Continue on steroid taper & bronchodilators   Chronic diastolic CHF: not on any diuretics at home as per med rec. Will continue on lasix (started 9/26). 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 Last chemo 06/17/21. Next chemo will be in 3 weeks as per onco. Etiology of pt's hoarseness as per ENT   Thrombocytopenia: likely secondary to recent chemo.  Hold further Lovenox  Dysphagia: likely secondary to lung cancer. Continue on PPI. MBSS confirms  aspiration. Discussed PEG tube placement in detail with pt today and he is agreeable for placement.  Discussed with interventional radiology and there was concern that patient is very high risk for conscious sedation, since he did not do well during previous placement of chemotherapy port.  I did discuss the possibility of core track placement for now, but patient does not want to have this done.  We will discuss with pulmonology to see if his respiratory status can be optimized since he will likely need pulmonary clearance before any sedation   Generalized anxiety disorder: severity unknown. Ativan prn   Obesity: BMI 30.6. Complicates overall care & prognosis    DVT prophylaxis: SCDs Code Status: full  Family Communication:  Disposition Plan: d/c back home   Level of care: Progressive Cardiac  Status is: Inpatient  Remains inpatient appropriate because:Ongoing diagnostic testing needed not appropriate for outpatient work up, IV treatments appropriate due to intensity of illness or inability to take PO, and Inpatient level of care appropriate due to severity of illness  Dispo:  Patient From: Home  Planned Disposition: To be determined  Medically stable for discharge: No       Consultants:  onco  Procedures:   Antimicrobials:    Subjective: Discussed the possibility of placing core track, the patient does not wish to have this done.  Objective: Vitals:   06/23/21 1514 06/23/21 1525 06/23/21 2009 06/23/21 2028  BP:  93/70  133/87  Pulse:  91  78  Resp:  18  19  Temp:  97.7 F (36.5 C)  98.8 F (37.1 C)  TempSrc:  Oral  SpO2: 95% 96% 99% 98%  Weight:      Height:        Intake/Output Summary (Last 24 hours) at 06/23/2021 2030 Last data filed at 06/23/2021 1800 Gross per 24 hour  Intake 240 ml  Output --  Net 240 ml    Filed Weights   06/20/21 0533 06/21/21 0358 06/23/21 0510  Weight: 94 kg 91.2 kg 90.5 kg    Examination:  General exam: Appears calm but  uncomfortable   Respiratory system: diminished breath sounds b/l  Cardiovascular system: S1/S2+. No rubs or clicks  Gastrointestinal system: Abd is soft, NT, ND, & hypoactive bowel sounds  Central nervous system: Alert and oriented. Moves all extremities  Psychiatry: Judgement and insight appear normal. Flat mood and affect    Data Reviewed: I have personally reviewed following labs and imaging studies  CBC: Recent Labs  Lab 06/18/21 0900 06/19/21 0416 06/20/21 0453 06/21/21 0320 06/22/21 0331  WBC 10.4 10.4 10.1 13.5* 7.7  HGB 14.5 14.0 14.2 14.5 14.4  HCT 41.9 41.6 43.3 43.5 43.3  MCV 83.5 84.0 82.6 82.5 83.0  PLT 165 159 132* 120* 96*   Basic Metabolic Panel: Recent Labs  Lab 06/18/21 0900 06/19/21 0416 06/20/21 0453 06/21/21 0320 06/22/21 0331  NA 136 138 134* 135 133*  K 3.9 4.1 3.9 3.6 3.6  CL 94* 94* 92* 91* 89*  CO2 32 35* 32 35* 34*  GLUCOSE 116* 118* 116* 112* 120*  BUN 19 23 24* 26* 26*  CREATININE 0.59* 0.64 0.61 0.58* 0.51*  CALCIUM 9.0 9.0 8.9 8.8* 8.7*   GFR: Estimated Creatinine Clearance: 103.7 mL/min (A) (by C-G formula based on SCr of 0.51 mg/dL (L)). Liver Function Tests: No results for input(s): AST, ALT, ALKPHOS, BILITOT, PROT, ALBUMIN in the last 168 hours.  No results for input(s): LIPASE, AMYLASE in the last 168 hours. No results for input(s): AMMONIA in the last 168 hours. Coagulation Profile: No results for input(s): INR, PROTIME in the last 168 hours. Cardiac Enzymes: No results for input(s): CKTOTAL, CKMB, CKMBINDEX, TROPONINI in the last 168 hours. BNP (last 3 results) No results for input(s): PROBNP in the last 8760 hours. HbA1C: No results for input(s): HGBA1C in the last 72 hours. CBG: No results for input(s): GLUCAP in the last 168 hours.  Lipid Profile: No results for input(s): CHOL, HDL, LDLCALC, TRIG, CHOLHDL, LDLDIRECT in the last 72 hours. Thyroid Function Tests: No results for input(s): TSH, T4TOTAL, FREET4,  T3FREE, THYROIDAB in the last 72 hours. Anemia Panel: No results for input(s): VITAMINB12, FOLATE, FERRITIN, TIBC, IRON, RETICCTPCT in the last 72 hours. Sepsis Labs: No results for input(s): PROCALCITON, LATICACIDVEN in the last 168 hours.   No results found for this or any previous visit (from the past 240 hour(s)).        Radiology Studies: DG Chest 2 View  Result Date: 06/23/2021 CLINICAL DATA:  COPD exacerbation EXAM: CHEST - 2 VIEW COMPARISON:  06/14/2021 FINDINGS: Frontal and lateral views of the chest demonstrates stable right chest wall port. The cardiac silhouette is unremarkable. Dense left upper and right middle lobe consolidation again noted. Chronic vascular congestion. No effusion or pneumothorax. IMPRESSION: 1. Persistent left upper and right middle lobe consolidation consistent with postobstructive changes based on hilar masses seen on previous CT. 2. Chronic vascular congestion. Electronically Signed   By: Randa Ngo M.D.   On: 06/23/2021 20:19        Scheduled Meds:  Chlorhexidine Gluconate Cloth  6 each Topical Daily  docusate sodium  200 mg Oral BID   feeding supplement (NEPRO CARB STEADY)  237 mL Oral TID BM   furosemide  40 mg Intravenous Daily   guaiFENesin  600 mg Oral BID   ipratropium-albuterol  3 mL Nebulization Q6H   lactulose  20 g Oral BID   pantoprazole (PROTONIX) IV  40 mg Intravenous Daily   [START ON 06/24/2021] predniSONE  40 mg Oral Q breakfast   Followed by   Derrill Memo ON 06/28/2021] predniSONE  30 mg Oral Q breakfast   Followed by   Derrill Memo ON 07/02/2021] predniSONE  20 mg Oral Q breakfast   Followed by   Derrill Memo ON 07/06/2021] predniSONE  10 mg Oral Q breakfast   Followed by   Derrill Memo ON 07/10/2021] predniSONE  5 mg Oral Q breakfast   Continuous Infusions:      LOS: 10 days    Time spent: 30 mins     Kathie Dike, MD [facility] Pager [phone]  If 7PM-7AM, please contact night-coverage 06/23/2021, 8:30 PM

## 2021-06-24 ENCOUNTER — Telehealth: Payer: Self-pay | Admitting: Pharmacist

## 2021-06-24 DIAGNOSIS — I5032 Chronic diastolic (congestive) heart failure: Secondary | ICD-10-CM

## 2021-06-24 DIAGNOSIS — D696 Thrombocytopenia, unspecified: Secondary | ICD-10-CM

## 2021-06-24 LAB — BASIC METABOLIC PANEL
Anion gap: 8 (ref 5–15)
BUN: 27 mg/dL — ABNORMAL HIGH (ref 8–23)
CO2: 36 mmol/L — ABNORMAL HIGH (ref 22–32)
Calcium: 8.5 mg/dL — ABNORMAL LOW (ref 8.9–10.3)
Chloride: 88 mmol/L — ABNORMAL LOW (ref 98–111)
Creatinine, Ser: 0.48 mg/dL — ABNORMAL LOW (ref 0.61–1.24)
GFR, Estimated: >60 mL/min (ref >60)
Glucose, Bld: 100 mg/dL — ABNORMAL HIGH (ref 70–99)
Potassium: 2.8 mmol/L — ABNORMAL LOW (ref 3.5–5.1)
Sodium: 132 mmol/L — ABNORMAL LOW (ref 135–145)

## 2021-06-24 LAB — MAGNESIUM: Magnesium: 2.3 mg/dL (ref 1.7–2.4)

## 2021-06-24 LAB — CBC
HCT: 39.2 % (ref 39.0–52.0)
Hemoglobin: 13.8 g/dL (ref 13.0–17.0)
MCH: 28.8 pg (ref 26.0–34.0)
MCHC: 35.2 g/dL (ref 30.0–36.0)
MCV: 81.7 fL (ref 80.0–100.0)
Platelets: 96 10*3/uL — ABNORMAL LOW (ref 150–400)
RBC: 4.8 MIL/uL (ref 4.22–5.81)
RDW: 12.2 % (ref 11.5–15.5)
WBC: 4.3 10*3/uL (ref 4.0–10.5)
nRBC: 0 % (ref 0.0–0.2)

## 2021-06-24 MED ORDER — POTASSIUM CHLORIDE 10 MEQ/100ML IV SOLN
10.0000 meq | INTRAVENOUS | Status: AC
Start: 2021-06-24 — End: 2021-06-24
  Administered 2021-06-24: 10 meq via INTRAVENOUS
  Filled 2021-06-24 (×3): qty 100

## 2021-06-24 MED ORDER — POTASSIUM CHLORIDE 20 MEQ PO PACK
40.0000 meq | PACK | Freq: Two times a day (BID) | ORAL | Status: DC
  Administered 2021-06-24 – 2021-06-26 (×6): 40 meq via ORAL
  Filled 2021-06-24 (×6): qty 2

## 2021-06-24 MED ORDER — LORATADINE 10 MG PO TABS
10.0000 mg | ORAL_TABLET | Freq: Every day | ORAL | Status: DC
  Administered 2021-06-24 – 2021-07-02 (×9): 10 mg via ORAL
  Filled 2021-06-24 (×9): qty 1

## 2021-06-24 MED ORDER — LORATADINE 10 MG PO TABS: 10.0000 mg | ORAL_TABLET | Freq: Every day | ORAL | Status: DC

## 2021-06-24 MED ORDER — IPRATROPIUM-ALBUTEROL 0.5-2.5 (3) MG/3ML IN SOLN
3.0000 mL | RESPIRATORY_TRACT | Status: DC
  Administered 2021-06-24 – 2021-06-25 (×4): 3 mL via RESPIRATORY_TRACT
  Filled 2021-06-24 (×5): qty 3

## 2021-06-24 MED ORDER — BUDESONIDE 0.5 MG/2ML IN SUSP
0.5000 mg | Freq: Two times a day (BID) | RESPIRATORY_TRACT | Status: DC
  Administered 2021-06-24 – 2021-07-02 (×15): 0.5 mg via RESPIRATORY_TRACT
  Filled 2021-06-24 (×15): qty 2

## 2021-06-24 MED ORDER — FUROSEMIDE 40 MG PO TABS
40.0000 mg | ORAL_TABLET | Freq: Every day | ORAL | Status: DC
  Administered 2021-06-25 – 2021-07-02 (×8): 40 mg via ORAL
  Filled 2021-06-24 (×8): qty 1

## 2021-06-24 NOTE — Telephone Encounter (Signed)
Oral Oncology Pharmacist Encounter  Received new prescription for Tagrisso (osimertinib) for the treatment of metastatic NSCLC, planned duration until disease progression or unacceptable drug toxicity. 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 His treatment will now be changed to targeted therapy with osimertinib.  Prescription dose and frequency assessed. Patient is noted to have dysphagia, with the plan to have a PEG tube placed. Osimertinib would be able to be administered via PEG tube.   Current medication list in Epic reviewed, one DDIs with osimertinib identified: Ondansetron: increased QTc risk, Recent ECG from 9/19 showed a QTc of 440ms. Due to current QTc recommend only using prochlorperazine as need for nausea and avoiding ondansetron use  Evaluated chart and no patient barriers to medication adherence identified.   Prescription has been e-scribed to the [facility] for benefits analysis and approval.  Oral Oncology Clinic will continue to follow initial counseling and start date.   Darl Pikes, PharmD, BCPS, BCOP, CPP Hematology/Oncology Clinical Pharmacist Practitioner [facility] [phone]  06/24/2021 1:11 PM

## 2021-06-24 NOTE — Consult Note (Signed)
[facility] Consultation      Date: 06/24/2021,   MRN# [id number] Derek Clarke Jan 02, 1956      AdmissionWeight: 100 kg                 CurrentWeight: 89.4 kg Derek Clarke is a 65 y.o. old male seen in consultation for COPD assessment  at the request of Memon.     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 with signs of aspiration, patient now requires PEG tube  PCCM asked to evaluate for pulmonary assessment for optimization of lung disease   PAST MEDICAL HISTORY   Past Medical History:  Diagnosis Date   Adenocarcinoma ([place])    lung; stage 4 with mets to liver   Chronic respiratory failure with hypoxia (HCC)    baseline 4 L [place]   COPD (chronic obstructive pulmonary disease) (HCC)    GAD (generalized anxiety disorder)      SURGICAL HISTORY   Past Surgical History:  Procedure Laterality Date   IR IMAGING GUIDED PORT INSERTION  06/03/2021     FAMILY HISTORY   Family History  Problem Relation Age of Onset   Lung disease Mother    Dementia Father      SOCIAL HISTORY   Social History   Tobacco Use   Smoking status: Former    Packs/day: 1.00    Years: 30.00    Pack years: 30.00    Types: Cigarettes    Quit date: 03/26/2021    Years since quitting: 0.2   Smokeless tobacco: Never  Substance Use Topics   Alcohol use: Yes   Drug use: Yes    Types: Marijuana    Comment: quit approx. 5 years ago     MEDICATIONS    Home Medication:    Current Medication:  Current Facility-Administered Medications:    acetaminophen (TYLENOL) tablet 650 mg, 650 mg, Oral, Q6H PRN **OR** acetaminophen (TYLENOL) suppository 650 mg, 650 mg, Rectal, Q6H PRN, Howerter, Justin B, DO   albuterol (PROVENTIL) (2.5 MG/3ML) 0.083% nebulizer solution 2.5 mg, 2.5 mg, Nebulization, Q4H PRN, Howerter, Justin B,  DO, 2.5 mg at 06/17/21 1746   alum & mag hydroxide-simeth (MAALOX/MYLANTA) 200-200-20 MG/5ML suspension 30 mL, 30 mL, Oral, Q4H PRN, Mansy, Jan A, MD, 30 mL at 06/20/21 0106   Chlorhexidine Gluconate Cloth 2 % PADS 6 each, 6 each, Topical, Daily, Wyvonnia Dusky, MD, 6 each at 06/23/21 0945   diphenhydrAMINE (BENADRYL) capsule 25 mg, 25 mg, Oral, Q6H PRN, Bonnielee Haff, MD, 25 mg at 06/21/21 1035   docusate sodium (COLACE) capsule 200 mg, 200 mg, Oral, BID, Jimmye Norman, Jamiese M, MD, 200 mg at 06/24/21 1694   feeding supplement (NEPRO CARB STEADY) liquid 237 mL, 237 mL, Oral, TID BM, Bonnielee Haff, MD, Last Rate: 0 mL/hr at 06/18/21 1456, 237 mL at 06/24/21 0809   furosemide (LASIX) injection 40 mg, 40 mg, Intravenous, Daily, Wyvonnia Dusky, MD, 40 mg at 06/24/21 0807   guaiFENesin (MUCINEX) 12 hr tablet 600 mg, 600 mg, Oral, BID, Bonnielee Haff, MD, 600 mg at 06/24/21 5038   ipratropium-albuterol (DUONEB) 0.5-2.5 (3) MG/3ML nebulizer solution 3 mL, 3 mL, Nebulization, Q6H, Howerter, Justin B, DO, 3 mL at 06/24/21 0155   lactulose (CHRONULAC) 10 GM/15ML solution 20 g, 20 g, Oral, BID, Wyvonnia Dusky, MD,  20 g at 06/20/21 2213   LORazepam (ATIVAN) injection 0.5 mg, 0.5 mg, Intravenous, Q4H PRN, Howerter, Justin B, DO, 0.5 mg at 06/23/21 2111   ondansetron (ZOFRAN) injection 4 mg, 4 mg, Intravenous, Q6H PRN, Howerter, Justin B, DO, 4 mg at 06/19/21 2321   pantoprazole (PROTONIX) injection 40 mg, 40 mg, Intravenous, Daily, Wyvonnia Dusky, MD, 40 mg at 06/24/21 4166   potassium chloride (KLOR-CON) packet 40 mEq, 40 mEq, Oral, BID, Loletha Grayer, MD, 40 mEq at 06/24/21 0630   potassium chloride 10 mEq in 100 mL IVPB, 10 mEq, Intravenous, Q1 Hr x 3, Wieting, Richard, MD, Stopped at 06/24/21 0846   predniSONE (DELTASONE) tablet 40 mg, 40 mg, Oral, Q breakfast, 40 mg at 06/24/21 0807 **FOLLOWED BY** [START ON 06/28/2021] predniSONE (DELTASONE) tablet 30 mg, 30 mg, Oral, Q breakfast  **FOLLOWED BY** [START ON 07/02/2021] predniSONE (DELTASONE) tablet 20 mg, 20 mg, Oral, Q breakfast **FOLLOWED BY** [START ON 07/06/2021] predniSONE (DELTASONE) tablet 10 mg, 10 mg, Oral, Q breakfast **FOLLOWED BY** [START ON 07/10/2021] predniSONE (DELTASONE) tablet 5 mg, 5 mg, Oral, Q breakfast, Kathie Dike, MD    ALLERGIES   Patient has no known allergies.     REVIEW OF SYSTEMS    Review of Systems:  Gen:  Denies  fever, sweats, chills weigh loss  HEENT: Denies blurred vision, double vision, ear pain, eye pain, hearing loss, nose bleeds, sore throat Cardiac:  No dizziness, chest pain or heaviness, chest tightness,edema Resp:   +SOB Gi: Denies swallowing difficulty, stomach pain, nausea or vomiting, diarrhea, constipation, bowel incontinence Gu:  Denies bladder incontinence, burning urine Ext:   Denies Joint pain, stiffness or swelling Skin: Denies  skin rash, easy bruising or bleeding or hives Endoc:  Denies polyuria, polydipsia , polyphagia or weight change Psych:   Denies depression, insomnia or hallucinations   Other:  All other systems negative   VS: BP 116/77 (BP Location: Right Arm)   Pulse 85   Temp 98.1 F (36.7 C)   Resp 17   Ht _0  (1.753 m)   Wt 89.4 kg   SpO2 100%   BMI 29.11 kg/m      PHYSICAL EXAM  Physical Examination:   GENERAL:NAD, no fevers, chills, no weakness no fatigue Unable to phonate HEAD: Normocephalic, atraumatic.  EYES: Pupils equal, round, reactive to light. Extraocular muscles intact. No scleral icterus.  MOUTH: Moist mucosal membrane. Dentition intact. No abscess noted.  EAR, NOSE, THROAT: Clear without exudates. No external lesions.  NECK: Supple. No thyromegaly. No nodules. No JVD.  PULMONARY: Diffuse coarse rhonchi right sided +wheezes CARDIOVASCULAR: S1 and S2. Regular rate and rhythm. No murmurs, rubs, or gallops. No edema. Pedal pulses 2+ bilaterally.  GASTROINTESTINAL: Soft, nontender, nondistended. No masses.  Positive bowel sounds. No hepatosplenomegaly.  MUSCULOSKELETAL: No swelling, clubbing, or edema. Range of motion full in all extremities.  NEUROLOGIC: Cranial nerves II through XII are intact. No gross focal neurological deficits. Sensation intact. Reflexes intact.  SKIN: No ulceration, lesions, rashes, or cyanosis. Skin warm and dry. Turgor intact.  PSYCHIATRIC: Mood, affect within normal limits. The patient is awake, alert and oriented x 3. Insight, judgment intact.  ALL OTHER ROS ARE NEGATIVE     IMAGING    DG Chest 2 View  Result Date: 06/23/2021 CLINICAL DATA:  COPD exacerbation EXAM: CHEST - 2 VIEW COMPARISON:  06/14/2021 FINDINGS: Frontal and lateral views of the chest demonstrates stable right chest wall port. The cardiac silhouette is unremarkable. Dense left upper and  right middle lobe consolidation again noted. Chronic vascular congestion. No effusion or pneumothorax. IMPRESSION: 1. Persistent left upper and right middle lobe consolidation consistent with postobstructive changes based on hilar masses seen on previous CT. 2. Chronic vascular congestion. Electronically Signed   By: Randa Ngo M.D.   On: 06/23/2021 20:19   CT Angio Chest PE W and/or Wo Contrast  Result Date: 06/07/2021 CLINICAL DATA:  Tachycardia, vomiting, metastatic lung cancer EXAM: CT ANGIOGRAPHY CHEST WITH CONTRAST TECHNIQUE: Multidetector CT imaging of the chest was performed using the standard protocol during bolus administration of intravenous contrast. Multiplanar CT image reconstructions and MIPs were obtained to evaluate the vascular anatomy. CONTRAST:  38m OMNIPAQUE IOHEXOL 350 MG/ML SOLN COMPARISON:  06/08/2011, 05/15/2021, FINDINGS: Cardiovascular: This is a technically adequate evaluation of the pulmonary vasculature. No filling defects or pulmonary emboli. Stable left hilar soft tissue mass encasing the left upper and left lower pulmonary arteries. Stable trace pericardial effusion. The heart is not  enlarged. No evidence of thoracic aortic aneurysm or dissection. Mild atherosclerosis of the aorta and coronary vessels. Mediastinum/Nodes: There is segmental wall thickening of the mid to distal thoracic esophagus. Confluent soft tissue within the mediastinum and bilateral hila consistent with adenopathy, not grossly changed since prior study. An enlarged left supraclavicular lymph node on image 9/4 measures 13 mm in short axis, unchanged. Thyroid and trachea are unremarkable. Lungs/Pleura: There are trace bilateral pleural effusions. Since the prior exam, there is progressive near complete consolidation of the left upper lobe, which may reflect progressive disease and/or postobstructive change. There is diffuse bilateral bronchial wall thickening, greatest at the lung bases. Continued right middle lobe collapse with occlusion of the right middle lobe bronchus again noted. Upper Abdomen: There are stable bilateral adrenal nodules, measuring up to 2.2 cm on the right. Multiple enlarged lymph nodes are again seen within the central upper abdomen, largest measuring 1.7 cm just posterior to the gastric cardia. Multiple hypodensities are again seen within the liver consistent with metastatic disease, grossly unchanged since prior study. Musculoskeletal: Stable sclerotic focus within the left T10 transverse process and within the T9 vertebral body, compatible with bony metastases. No acute displaced fractures. Reconstructed images demonstrate no additional findings. Review of the MIP images confirms the above findings. IMPRESSION: 1. No evidence of pulmonary embolus. 2. Persistent left hilar mass surrounding the left hilar vessels, with increasing near total consolidation of the left upper lobe since prior study. This may be progression of malignancy versus postobstructive change. 3. Continued coalescing lymphadenopathy throughout the mediastinum and bilateral hila. 4. Bilateral bronchial wall thickening, greatest at the  lung bases. 5. Trace bilateral pleural effusions. 6. Segmental wall thickening of the mid to distal thoracic esophagus, which could be inflammatory or infectious. 7. Upper abdominal metastases with continued multiple liver lesions, bilateral adrenal nodularity, and upper abdominal adenopathy. Electronically Signed   By: MRanda NgoM.D.   On: 06/07/2021 15:12   NM PET Image Initial (PI) Skull Base To Thigh  Result Date: 06/14/2021 CLINICAL DATA:  Initial treatment strategy for lung nodule. Lymphadenopathy with liver lesions. EXAM: NUCLEAR MEDICINE PET SKULL BASE TO THIGH TECHNIQUE: 12.0 mCi F-18 FDG was injected intravenously. Full-ring PET imaging was performed from the skull base to thigh after the radiotracer. CT data was obtained and used for attenuation correction and anatomic localization. Fasting blood glucose: 122 mg/dl COMPARISON:  CTA Chest 06/07/2021 FINDINGS: Mediastinal blood pool activity: SUV max 0.8 Liver activity: SUV max NA NECK: 12 mm left cervical node on 37/3  is hypermetabolic with SUV max = 4.3. Small hypermetabolic lymph nodes evident right lower neck. Bilateral supraclavicular hypermetabolic lymph nodes including 11 mm left supraclavicular node on 60/3 with SUV max = 7.4. Markedly Markedly hypermetabolic left thyroid nodule measures 1.4 cm on image 60/3 with SUV max = 21.7. Incidental CT findings: none CHEST: Hypermetabolic mediastinal lymphadenopathy evident. Index subcarinal measures 18 mm short axis on 01/02/3 with SUV max = 7.0 hypermetabolic lymph nodes are seen in both hilar regions into the prevascular space. 16 mm prevascular node on 87/3 demonstrates SUV max = 7.0. Marked wall thickening noted in the mid esophagus, at approximately the level of the carina. Although somewhat difficult to assess given the adjacent hypermetabolic lymphadenopathy, there does appear to be marked hypermetabolism in the wall of this abnormal appearing esophagus with SUV max = 7.7. Areas of  collapse/consolidation in the left upper lobe, right middle lobe and infrahilar left lower lobe show substantial hypermetabolism. There is diffuse interstitial opacity in both lungs with innumerable tiny bilateral pulmonary nodules. Incidental CT findings: Trace pericardial effusion. Right Port-A-Cath tip is positioned in the distal SVC. Coronary artery calcification is evident. Mild atherosclerotic calcification is noted in the wall of the thoracic aorta. ABDOMEN/PELVIS: Multiple hypermetabolic liver lesions evident. 18 mm subcapsular lesion anterior left liver on 150/3 demonstrates SUV max = 7.7. Hypermetabolic gastrohepatic ligament lymph nodes measure up to 14 mm on 150/3 with SUV max = 6.6 12 mm left para-aortic node on 341/9 is hypermetabolic with SUV max = 4.4. 2.4 cm right adrenal nodule shows low attenuation, suggesting adenoma with associated hypermetabolism SUV max = 5.3. Incidental CT findings: Prostate gland is enlarged. Scattered diverticuli noted left colon without diverticulitis. There is mild atherosclerotic calcification of the abdominal aorta without aneurysm. SKELETON: Scattered hypermetabolic bone metastases evident. 12 mm posterior right iliac sclerotic lesion on 02/20 8/3 shows SUV max = 5.6. Sacral metastases demonstrate SUV max = 5.3 subtle 13 mm left femoral neck lesion demonstrates SUV max = 5.3. Incidental CT findings: none IMPRESSION: 1. Similar right middle and left upper lobe collapse with associated marked hypermetabolism. There is diffuse interstitial and micro nodularity in both lungs consistent with metastatic disease. 2. Hypermetabolic metastatic lymphadenopathy in the neck, mediastinum, hilar regions, gastrohepatic ligament, and retroperitoneum. 3. Marked hypermetabolism identified in the mid esophagus with associated esophageal wall thickening. This may be related to secondary involvement although primary esophageal neoplasm not excluded. 4. Hypermetabolic liver and bone  metastases. 5. Hypermetabolic right adrenal nodule. This is felt to be likely an adenoma although metastatic involvement is certainly possible. 6. Hypermetabolic left thyroid nodule. Recommend thyroid US and biopsy. (Ref: J Am Coll Radiol. 2015 Feb;12(2): 143-50). 7.  Aortic Atherosclerois (ICD10-170.0) Electronically Signed   By: Misty Stanley M.D.   On: 06/14/2021 12:27   DG Chest Port 1 View  Result Date: 06/14/2021 CLINICAL DATA:  Dyspnea EXAM: PORTABLE CHEST 1 VIEW COMPARISON:  06/13/2021 FINDINGS: Cardiac shadow is stable. Right chest wall port is again seen and stable. Volume loss in the left hemithorax is noted with diffuse increased airspace opacity stable from the previous day. Mild right basilar opacity is noted stable in appearance as well. No new focal abnormality is noted. IMPRESSION: Stable bilateral airspace opacities similar to that seen on recent CT and plain film examination. Electronically Signed   By: Inez Catalina M.D.   On: 06/14/2021 19:39   DG Chest Port 1 View  Result Date: 06/13/2021 CLINICAL DATA:  Shortness of breath.  Recent pneumonia. EXAM: PORTABLE  CHEST 1 VIEW COMPARISON:  Radiograph and chest CT 06/07/2021. FINDINGS: Right chest port remains in place. Similar volume loss in the left hemithorax with diffusely increased opacity, opacity throughout the left upper lobe with seen on recent CT. Opacity at the right lung base corresponds to right middle lobe collapse. Background bronchial thickening. Trace pleural effusions on CT are not well demonstrated by radiograph. No pneumothorax or significant change from prior imaging. Heart size is grossly stable. IMPRESSION: 1. No significant change from prior imaging. 2. Volume loss in the left hemithorax with diffusely increased opacity throughout the left upper lung zone, upper lobe consolidation seen on recent CT. 3. Right lung base opacity corresponds to right middle lobe collapse. 4. Background bronchial thickening. Electronically  Signed   By: Keith Rake M.D.   On: 06/13/2021 18:36   DG Chest Portable 1 View  Result Date: 06/07/2021 CLINICAL DATA:  Cough, shortness of breath. EXAM: PORTABLE CHEST 1 VIEW COMPARISON:  May 14, 2021. FINDINGS: Stable cardiomediastinal silhouette. Interval placement of right internal jugular Port-A-Cath with distal tip in expected position of the SVC. No pneumothorax is noted. Left upper lobe consolidation is noted concerning for pneumonia or atelectasis. Probable central pulmonary vascular congestion is noted. Bony thorax is unremarkable. IMPRESSION: Left upper lobe opacity is noted concerning for pneumonia or atelectasis. Electronically Signed   By: Marijo Conception M.D.   On: 06/07/2021 12:13   ECHOCARDIOGRAM COMPLETE  Result Date: 06/08/2021    ECHOCARDIOGRAM REPORT   Patient Name:   Derek Clarke Date of Exam: 06/08/2021 Medical Rec #:  [id number]      Height:       69.0 in Accession #:    [id number]     Weight:       220.7 lb Date of Birth:  [birth year]      BSA:          2.154 m Patient Age:    35 years       BP:           130/81 mmHg Patient Gender: M              HR:           108 bpm. Exam Location:  [facility] Procedure: 2D Echo, Cardiac Doppler, Color Doppler and Strain Analysis Indications:    CHF-acute diastolic K02.54  History:        Patient has no prior history of Echocardiogram examinations.                 COPD.  Sonographer:    Sherrie Sport Referring Phys: [id number] Ivor Costa Diagnosing      Kate Sable MD Phys:  Sonographer Comments: Suboptimal apical window. Global longitudinal strain was attempted. IMPRESSIONS  1. Left ventricular ejection fraction, by estimation, is 65 to 70%. The left ventricle has normal function. The left ventricle has no regional wall motion abnormalities. Left ventricular diastolic parameters are consistent with Grade I diastolic dysfunction (impaired relaxation).  2. Right ventricular systolic function is normal. The right ventricular size is mildly enlarged.   3. A small pericardial effusion is present.  4. The mitral valve is normal in structure. No evidence of mitral valve regurgitation.  5. The aortic valve is tricuspid. Aortic valve regurgitation is not visualized. FINDINGS  Left Ventricle: Left ventricular ejection fraction, by estimation, is 65 to 70%. The left ventricle has normal function. The left ventricle has no regional wall motion abnormalities. Global longitudinal strain performed but not reported based  on interpreter judgement due to suboptimal tracking. 3D left ventricular ejection fraction analysis performed but not reported based on interpreter judgement due to suboptimal quality. The left ventricular internal cavity size was normal in size. There is no left ventricular hypertrophy. Left ventricular diastolic parameters are consistent with Grade I diastolic dysfunction (impaired relaxation). Right Ventricle: The right ventricular size is mildly enlarged. No increase in right ventricular wall thickness. Right ventricular systolic function is normal. Left Atrium: Left atrial size was normal in size. Right Atrium: Right atrial size was normal in size. Pericardium: A small pericardial effusion is present. Mitral Valve: The mitral valve is normal in structure. No evidence of mitral valve regurgitation. Tricuspid Valve: The tricuspid valve is grossly normal. Tricuspid valve regurgitation is trivial. Aortic Valve: The aortic valve is tricuspid. Aortic valve regurgitation is not visualized. Aortic valve mean gradient measures 6.0 mmHg. Aortic valve peak gradient measures 10.5 mmHg. Aortic valve area, by VTI measures 2.89 cm. Pulmonic Valve: The pulmonic valve was not well visualized. Pulmonic valve regurgitation is not visualized. Aorta: The aortic root is normal in size and structure. IAS/Shunts: No atrial level shunt detected by color flow Doppler.  LEFT VENTRICLE PLAX 2D LVIDd:         4.80 cm  Diastology LVIDs:         2.60 cm  LV e' medial:   1.96 cm/s LV  PW:         1.30 cm  LV E/e' medial: 38.7 LV IVS:        1.20 cm LVOT diam:     2.10 cm LV SV:         76 LV SV Index:   35 LVOT Area:     3.46 cm 3D Volume EF:                         3D EF:        52 %                         LV EDV:       96 ml                         LV ESV:       46 ml                         LV SV:        50 ml RIGHT VENTRICLE RV S prime:     14.00 cm/s TAPSE (M-mode): 5.7 cm LEFT ATRIUM             Index       RIGHT ATRIUM           Index LA diam:        3.30 cm 1.53 cm/m  RA Area:     21.00 cm LA Vol (A2C):   64.2 ml 29.80 ml/m RA Volume:   55.60 ml  25.81 ml/m LA Vol (A4C):   31.2 ml 14.48 ml/m LA Biplane Vol: 45.4 ml 21.07 ml/m  AORTIC VALVE                    PULMONIC VALVE AV Area (Vmax):    2.39 cm     PV Vmax:       0.73 m/s AV Area (Vmean):   2.78 cm  PV Peak grad:  2.1 mmHg AV Area (VTI):     2.89 cm AV Vmax:           162.00 cm/s AV Vmean:          108.000 cm/s AV VTI:            0.261 m AV Peak Grad:      10.5 mmHg AV Mean Grad:      6.0 mmHg LVOT Vmax:         112.00 cm/s LVOT Vmean:        86.800 cm/s LVOT VTI:          0.218 m LVOT/AV VTI ratio: 0.84  AORTA Ao Root diam: 3.30 cm MITRAL VALVE                TRICUSPID VALVE MV Area (PHT): 3.95 cm     TR Peak grad:   30.0 mmHg MV Decel Time: 192 msec     TR Vmax:        274.00 cm/s MV E velocity: 75.80 cm/s MV A velocity: 119.00 cm/s  SHUNTS MV E/A ratio:  0.64         Systemic VTI:  0.22 m                             Systemic Diam: 2.10 cm Kate Sable MD Electronically signed by Kate Sable MD Signature Date/Time: 06/08/2021/3:10:53 PM    Final    IR IMAGING GUIDED PORT INSERTION  Result Date: 06/03/2021 INDICATION: Metastatic lung cancer.  Chemotherapy. EXAM: IMPLANTED PORT A CATH PLACEMENT WITH ULTRASOUND AND FLUOROSCOPIC GUIDANCE MEDICATIONS: None ANESTHESIA/SEDATION: Moderate (conscious) sedation was employed during this procedure. A total of Versed 0 mg and Fentanyl 25 mcg was administered  intravenously. Moderate Sedation Time: 20 minutes. The patient's level of consciousness and vital signs were monitored continuously by radiology nursing throughout the procedure under my direct supervision. FLUOROSCOPY TIME:  0 minutes, 24 seconds (4.5 mGy) COMPLICATIONS: None immediate. PROCEDURE: The procedure, risks, benefits, and alternatives were explained to the patient. Questions regarding the procedure were encouraged and answered. The patient understands and consents to the procedure. The neck and chest were prepped with chlorhexidine in a sterile fashion, and a sterile drape was applied covering the operative field. Maximum barrier sterile technique with sterile gowns and gloves were used for the procedure. A timeout was performed prior to the initiation of the procedure. Local anesthesia was provided with 1% lidocaine with epinephrine. After creating a small venotomy incision, a micropuncture kit was utilized to access the internal jugular vein under direct, real-time ultrasound guidance. Ultrasound image documentation was performed. The microwire was kinked to measure appropriate catheter length. A subcutaneous port pocket was then created along the upper chest wall utilizing a combination of sharp and blunt dissection. The pocket was irrigated with sterile saline. A single lumen ISP power injectable port was chosen for placement. The 8 Fr catheter was tunneled from the port pocket site to the venotomy incision. The port was placed in the pocket. The external catheter was trimmed to appropriate length. At the venotomy, an 8 Fr peel-away sheath was placed over a guidewire under fluoroscopic guidance. The catheter was then placed through the sheath and the sheath was removed. Final catheter positioning was confirmed and documented with a fluoroscopic spot radiograph. The port was accessed with a Huber needle, aspirated and flushed with heparinized saline. The port pocket incision was closed with  interrupted 3-0 Vicryl suture then Dermabond was applied, including at the venotomy incision. Dressings were placed. The patient tolerated the procedure well without immediate post procedural complication. IMPRESSION: Successful placement of a right internal jugular approach power injectable Port-A-Cath. The catheter is ready for immediate use. Michaelle Birks, MD [facility] Electronically Signed   By: Michaelle Birks M.D.   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   Patient has had poor po intake and is on dysphagia I as per speech and pt completed MBSS which confirmed the pt was aspirating. Speech & RD recommend PEG tube placement. Pulmonary was consulted to optimize pulmonary status.  AT this time, there is no further recommendations to maximize patients pulmonary status. Patient is moderate risk for operative and post operative complications  1.oxygen as needed 2.flutter valve and incentive spirometry as needed 3.no need for  ABX now 4.on pred now 5.on nebulized therapy   Patient are satisfied with Plan of action and management. All questions answered  Corrin Parker, M.D.  [facility]  Medical Director [place] Director [facility] Cardio-Pulmonary Department

## 2021-06-24 NOTE — TOC Progression Note (Addendum)
Transition of Care [facility]) - Progression Note    Patient Details  Name: Derek Clarke MRN: [id number] Date of Birth: 07-Jan-1956  Transition of Care [facility]) CM/SW [place], LCSW Phone Number: 06/24/2021, 11:55 AM  Clinical Narrative:   Spoke to patient to confirm he does not want HH. Patient says he does think he needs it now since he is maybe getting a feeding tube. Referral made for charity [facility] to [place] with [facility].    Expected Discharge Plan: Home/Self Care Barriers to Discharge: Continued Medical Work up  Expected Discharge Plan and Services Expected Discharge Plan: Home/Self Care       Living arrangements for the past 2 months: Single Family Home                                       Social Determinants of Health (SDOH) Interventions    Readmission Risk Interventions No flowsheet data found.

## 2021-06-24 NOTE — Progress Notes (Signed)
Patient ID: Derek Clarke, male   DOB: October 31, 1955, 65 y.o.   MRN: [id number] [facility] PROGRESS NOTE  Derek Clarke [id number]:[id number] DOB: [birth year] DOA: 06/13/2021 PCP: Pcp, No  HPI/Subjective: Patient agreeable to PEG feeding tube but interventional radiology wanted him medically tuned up prior to any procedure.  Patient states he is limited with the diet but trying to eat.  Talks with a hoarse voice.  Breathing is okay.  Some cough  Objective: Vitals:   06/24/21 0724 06/24/21 1250  BP: 116/77 (!) 111/59  Pulse: 85 98  Resp: 17 17  Temp: 98.1 F (36.7 C) 98.2 F (36.8 C)  SpO2: 100% 100%    Intake/Output Summary (Last 24 hours) at 06/24/2021 1454 Last data filed at 06/24/2021 1340 Gross per 24 hour  Intake 926.75 ml  Output --  Net 926.75 ml   Filed Weights   06/21/21 0358 06/23/21 0510 06/24/21 0400  Weight: 91.2 kg 90.5 kg 89.4 kg    ROS: Review of Systems  Respiratory:  Positive for cough and shortness of breath.   Cardiovascular:  Negative for chest pain.  Gastrointestinal:  Negative for abdominal pain, nausea and vomiting.  Exam: Physical Exam HENT:     Head: Normocephalic.     Mouth/Throat:     Pharynx: No oropharyngeal exudate.  Eyes:     General: Lids are normal.     Conjunctiva/sclera: Conjunctivae normal.  Cardiovascular:     Rate and Rhythm: Normal rate and regular rhythm.     Heart sounds: Normal heart sounds, S1 normal and S2 normal.  Pulmonary:     Breath sounds: Examination of the right-middle field reveals decreased breath sounds. Examination of the left-middle field reveals decreased breath sounds. Examination of the right-lower field reveals decreased breath sounds and wheezing. Examination of the left-lower field reveals decreased breath sounds and wheezing. Decreased breath sounds and wheezing present. No rhonchi or rales.  Abdominal:     Palpations: Abdomen is soft.     Tenderness: There is no abdominal tenderness.  Musculoskeletal:      Right lower leg: No swelling.     Left lower leg: No swelling.  Skin:    General: Skin is warm.     Findings: No rash.  Neurological:     Mental Status: He is alert and oriented to person, place, and time.      Scheduled Meds:  budesonide (PULMICORT) nebulizer solution  0.5 mg Nebulization BID   Chlorhexidine Gluconate Cloth  6 each Topical Daily   docusate sodium  200 mg Oral BID   feeding supplement (NEPRO CARB STEADY)  237 mL Oral TID BM   furosemide  40 mg Intravenous Daily   guaiFENesin  600 mg Oral BID   ipratropium-albuterol  3 mL Nebulization Q4H   lactulose  20 g Oral BID   pantoprazole (PROTONIX) IV  40 mg Intravenous Daily   potassium chloride  40 mEq Oral BID   predniSONE  40 mg Oral Q breakfast   Followed by   Derrill Memo ON 06/28/2021] predniSONE  30 mg Oral Q breakfast   Followed by   Derrill Memo ON 07/02/2021] predniSONE  20 mg Oral Q breakfast   Followed by   Derrill Memo ON 07/06/2021] predniSONE  10 mg Oral Q breakfast   Followed by   Derrill Memo ON 07/10/2021] predniSONE  5 mg Oral Q breakfast    Assessment/Plan:  Acute on chronic hypoxic respiratory failure.  Multifactorial including COPD exacerbation, lung cancer.  Appreciate pulmonary consultation.  Patient  chronically on 4 L of oxygen.  Patient on prednisone taper.  Continue nebulizer treatments. Postobstructive pneumonia.  Completed antibiotic course. Chronic diastolic congestive heart failure.  We will change Lasix to oral. Hypokalemia replace IV and oral potassium. 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  Follow-up with Dr. Grayland Ormond as outpatient. 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  Will speak with interventional radiology again on Sunday to see if we can place a PEG tube on Monday or Tuesday. Anxiety on Ativan as needed     Code Status:     Code Status Orders  (From admission, onward)           Start     Ordered   06/13/21 2019  Full code  Continuous         09 /19/22 2019           Code Status History     Date Active Date Inactive Code Status Order ID Comments User Context   06/07/2021 1821 06/09/2021 2141 Full Code [id number]  Ivor Costa, MD Inpatient   05/15/2021 0309 05/22/2021 2235 Full Code [id number]  Athena Masse, MD ED      Family Communication: Spoke with brother on the phone Disposition Plan: Status is: Inpatient  Dispo:  Patient From: Home  Planned Disposition: home  Medically stable for discharge: No   Time spent: 28 minutes  [place]

## 2021-06-24 NOTE — Progress Notes (Signed)
Pt refused IV potassium runs stating it was "too much for him" in the morning.  Attempted later in the morning and pt stated he wanted to eat lunch first.  Attempted again after lunch and pt stated he was still not ready but was getting close.  Education provided.  Pt verbalized understanding.  MD notified.  Will continue attempts and provide education.

## 2021-06-24 NOTE — Progress Notes (Signed)
SLP Cancellation Note  Patient Details Name: Derek Clarke MRN: [id number] DOB: 1956-06-06   Cancelled treatment:       Reason Eval/Treat Not Completed:  (chart reviewed). Patient agreeable to PEG feeding tube per MD, but interventional radiology(IR) wants patient medically improve b/f the procedure. Pt continues on his dysphagia diet; noted recent CXR: "Persistent left upper and right middle lobe consolidation consistent with postobstructive changes based on hilar masses seen on previous CT. 2. Chronic vascular congestion.". Pt is on 4L [place] O2 support. Recommend f/u for dysphagia tx at next venue of care. ST services will s/o at this time. Recommend contined aspiration precautions w/ oral diet as recommended per MBSS. MD updated.      Orinda Kenner, MS, CCC-SLP Speech Language Pathologist Rehab Services [phone] [facility] 06/24/2021, 4:35 PM

## 2021-06-25 LAB — BASIC METABOLIC PANEL
Anion gap: 8 (ref 5–15)
BUN: 22 mg/dL (ref 8–23)
CO2: 36 mmol/L — ABNORMAL HIGH (ref 22–32)
Calcium: 8.8 mg/dL — ABNORMAL LOW (ref 8.9–10.3)
Chloride: 89 mmol/L — ABNORMAL LOW (ref 98–111)
Creatinine, Ser: 0.5 mg/dL — ABNORMAL LOW (ref 0.61–1.24)
GFR, Estimated: >60 mL/min (ref >60)
Glucose, Bld: 99 mg/dL (ref 70–99)
Potassium: 3.1 mmol/L — ABNORMAL LOW (ref 3.5–5.1)
Sodium: 133 mmol/L — ABNORMAL LOW (ref 135–145)

## 2021-06-25 LAB — PHOSPHORUS: Phosphorus: 2.8 mg/dL (ref 2.5–4.6)

## 2021-06-25 MED ORDER — FLUTICASONE PROPIONATE 50 MCG/ACT NA SUSP
1.0000 | Freq: Every day | NASAL | Status: DC
  Administered 2021-06-25 – 2021-06-29 (×3): 1 via NASAL
  Filled 2021-06-25: qty 16

## 2021-06-25 MED ORDER — IPRATROPIUM-ALBUTEROL 0.5-2.5 (3) MG/3ML IN SOLN
3.0000 mL | Freq: Four times a day (QID) | RESPIRATORY_TRACT | Status: DC
  Administered 2021-06-25 – 2021-07-02 (×25): 3 mL via RESPIRATORY_TRACT
  Filled 2021-06-25 (×26): qty 3

## 2021-06-25 NOTE — Progress Notes (Signed)
Physical Therapy Treatment Patient Details Name: Derek Clarke MRN: [id number] DOB: [birth year] Today's Date: 06/25/2021   History of Present Illness Pt is a 65 y/o M with h/o stage IV adenocarcinoma of the left lung with metastasis to the liver, former smoker, chronic hypoxic respiratory failure on 4 L continuous nasal cannula and COPD who is admitted to [facility] on 06/13/2021 with acute on chronic hypoxic respiratory failure after presenting from home to [facility] ED complaining of shortness of breath. Note: pt with two recent admissions with similar presentation.    PT Comments    Pt was standing I'ly at sink brushing his teeth upon arriving. Was on 3 L o2 throughout session with sao2 > 94%. HR between 101bpm and 122 bpm. He was able to ambulate around room ~ 80 ft prior to fatigue and requesting seated rest. Pt is progressing and will continue to benefit from skilled acute PT to improve activity tolerance and strength. RN is aware of pt's abilities.    Recommendations for follow up therapy are one component of a multi-disciplinary discharge planning process, led by the attending physician.  Recommendations may be updated based on patient status, additional functional criteria and insurance authorization.  Follow Up Recommendations  No PT follow up     Equipment Recommendations  None recommended by PT       Precautions / Restrictions Precautions Precautions: None Precaution Comments: Metastatic disease Restrictions Weight Bearing Restrictions: No     Mobility  Bed Mobility      General bed mobility comments: not formally assessed this session    Transfers Overall transfer level: Independent Equipment used: None   Ambulation/Gait Ambulation/Gait assistance: Supervision Gait Distance (Feet): 80 Feet Assistive device: None Gait Pattern/deviations: Step-through pattern Gait velocity: mildly decreased   General Gait Details: pt ambulated on 3 L o2 with  sao2 > 94%. HR between 101bpm-122bpm. does endorse fatigue quickly but was very pleased with how he performed.     Balance Overall balance assessment: No apparent balance deficits (not formally assessed)         Cognition Arousal/Alertness: Awake/alert Behavior During Therapy: WFL for tasks assessed/performed;Flat affect Overall Cognitive Status: Within Functional Limits for tasks assessed        General Comments: pt is soft spoken and requires increased time to respond. extremely pleasant and cooperative             Pertinent Vitals/Pain Pain Assessment: No/denies pain Pain Score: 0-No pain     PT Goals (current goals can now be found in the care plan section) Acute Rehab PT Goals Patient Stated Goal: "To improve breathing" Progress towards PT goals: Progressing toward goals    Frequency    Min 2X/week      PT Plan Current plan remains appropriate       AM-PAC PT "6 Clicks" Mobility   Outcome Measure  Help needed turning from your back to your side while in a flat bed without using bedrails?: None Help needed moving from lying on your back to sitting on the side of a flat bed without using bedrails?: None Help needed moving to and from a bed to a chair (including a wheelchair)?: None Help needed standing up from a chair using your arms (e.g., wheelchair or bedside chair)?: None Help needed to walk in hospital room?: None Help needed climbing 3-5 steps with a railing? : None 6 Click Score: 24    End of Session Equipment Utilized During Treatment: Oxygen (3 L) Activity Tolerance:  Patient tolerated treatment well;Patient limited by fatigue Patient left: in chair;with call bell/phone within reach Nurse Communication: Mobility status PT Visit Diagnosis: Difficulty in walking, not elsewhere classified (R26.2)     Time: 8115-7262 PT Time Calculation (min) (ACUTE ONLY): 14 min  Charges:  $Gait Training: 8-22 mins                     Julaine Fusi  PTA 06/25/21, 5:27 PM

## 2021-06-25 NOTE — Plan of Care (Signed)
  Problem: Education: Goal: Knowledge of General Education information will improve Description: Including pain rating scale, medication(s)/side effects and non-pharmacologic comfort measures Outcome: Progressing   Problem: Clinical Measurements: Goal: Diagnostic test results will improve Outcome: Progressing Goal: Respiratory complications will improve Outcome: Progressing   Problem: Coping: Goal: Level of anxiety will decrease Outcome: Not Progressing  Still remains anxious at times

## 2021-06-25 NOTE — Progress Notes (Signed)
Patient ID: Derek Clarke, male   DOB: 1955/10/21, 65 y.o.   MRN: [id number] [facility] PROGRESS NOTE  [name] Derek Clarke DOB: Jan 27, 1956 DOA: 06/13/2021 PCP: Pcp, No  HPI/Subjective: Patient asking questions about PEG tube and feedings.  Breathing is okay.  Always has a little shortness of breath.  Talking with a little stronger voice today.  Initially admitted with acute on chronic hypoxic respiratory failure and pneumonia.  Objective: Vitals:   06/25/21 1108 06/25/21 1438  BP: 108/82 114/78  Pulse: 95 (!) 101  Resp: 19 17  Temp: 97.6 F (36.4 C) 97.8 F (36.6 C)  SpO2: 100% 98%    Intake/Output Summary (Last 24 hours) at 06/25/2021 1552 Last data filed at 06/25/2021 1400 Gross per 24 hour  Intake 1744 ml  Output 300 ml  Net 1444 ml   Filed Weights   06/23/21 0510 06/24/21 0400 06/25/21 0525  Weight: 90.5 kg 89.4 kg 89 kg    ROS: Review of Systems  Respiratory:  Positive for shortness of breath.   Cardiovascular:  Negative for chest pain.  Gastrointestinal:  Negative for abdominal pain, nausea and vomiting.  Exam: Physical Exam HENT:     Head: Normocephalic.     Mouth/Throat:     Pharynx: No oropharyngeal exudate.  Eyes:     General: Lids are normal.     Conjunctiva/sclera: Conjunctivae normal.     Pupils: Pupils are equal, round, and reactive to light.  Cardiovascular:     Rate and Rhythm: Normal rate and regular rhythm.     Heart sounds: Normal heart sounds, S1 normal and S2 normal.  Pulmonary:     Breath sounds: Examination of the right-lower field reveals decreased breath sounds. Examination of the left-lower field reveals decreased breath sounds. Decreased breath sounds present. No wheezing, rhonchi or rales.  Abdominal:     Palpations: Abdomen is soft.     Tenderness: There is no abdominal tenderness.  Musculoskeletal:     Right ankle: No swelling.     Left ankle: No swelling.  Skin:    General: Skin is warm.     Findings: No rash.   Neurological:     Mental Status: He is alert and oriented to person, place, and time.      Scheduled Meds:  budesonide (PULMICORT) nebulizer solution  0.5 mg Nebulization BID   Chlorhexidine Gluconate Cloth  6 each Topical Daily   docusate sodium  200 mg Oral BID   feeding supplement (NEPRO CARB STEADY)  237 mL Oral TID BM   fluticasone  1 spray Each Nare Daily   furosemide  40 mg Oral Daily   guaiFENesin  600 mg Oral BID   ipratropium-albuterol  3 mL Nebulization Q6H   lactulose  20 g Oral BID   loratadine  10 mg Oral Daily   pantoprazole (PROTONIX) IV  40 mg Intravenous Daily   potassium chloride  40 mEq Oral BID   predniSONE  40 mg Oral Q breakfast   Followed by   Derrill Memo ON 06/28/2021] predniSONE  30 mg Oral Q breakfast   Followed by   Derrill Memo ON 07/02/2021] predniSONE  20 mg Oral Q breakfast   Followed by   Derrill Memo ON 07/06/2021] predniSONE  10 mg Oral Q breakfast   Followed by   Derrill Memo ON 07/10/2021] predniSONE  5 mg Oral Q breakfast    Assessment/Plan:  Acute on chronic hypoxic respiratory failure.  Multifactorial including COPD exacerbation lung cancer.  Appreciate pulmonary consultation.  Patient  on prednisone taper.  Patient chronically on 4 L of oxygen.  Continue nebulizer treatments. Postobstructive pneumonia.  Completed antibiotic course. Chronic diastolic congestive heart failure.  Lasix changed to oral with hypokalemia. Hypokalemia.  Potassium 3.1 today.  Continue oral supplementation twice a day. Stage IV adenocarcinoma of the lung with metastases to the liver and bone.  Follow-up with Dr. Grayland Ormond as outpatient. 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  Will speak with interventional radiology again on Sunday to see if PEG tube can be placed on Monday or Tuesday. Anxiety.  On Ativan as needed Patient complains of some nosebleeds and dryness.  We will humidify oxygen and have Flonase nasal spray.        Code Status:     Code Status  Orders  (From admission, onward)           Start     Ordered   06/13/21 2019  Full code  Continuous        09 /19/22 2019           Code Status History     Date Active Date Inactive Code Status Order ID Comments User Context   06/07/2021 1821 06/09/2021 2141 Full Code [id number]  Ivor Costa, MD Inpatient   05/15/2021 0309 05/22/2021 2235 Full Code [id number]  Athena Masse, MD ED      Family Communication: Updated patient's brother yesterday Disposition Plan: Status is: Inpatient  Dispo:  Patient From: Home  Planned Disposition: Home after PEG placement and tolerating tube feeds.  Medically stable for discharge: No   Time spent: 26 minutes, case discussed with nursing staff  Loletha Grayer  [facility]

## 2021-06-25 NOTE — Progress Notes (Signed)
In to take pt's vs this am. Pt to bathroom. After coming out, this Rn asked about going to the bathroom, and the pt stated that he did but there was no urinal. Rn reminded the pt to use the urinal and to let the rn or cna know when it has been used so that we can measure it. Pt stated that he understood.Rn went and got a urinal and placed in the bathroom.

## 2021-06-26 DIAGNOSIS — E876 Hypokalemia: Secondary | ICD-10-CM

## 2021-06-26 DIAGNOSIS — E440 Moderate protein-calorie malnutrition: Secondary | ICD-10-CM

## 2021-06-26 NOTE — Progress Notes (Addendum)
IR was requested for image guided G tube placement.   Imaging reviewed by Dr. Denna Haggard, anatomy amendable for percutaneous G tube placement.  Per chart review, there was a concern regarding sedation due to patient's respiratory condition.  Pulmonary consult was recommended and patient was evaluated by Dr. Mortimer Fries on 9/30, no further recommendation to maximize pt pulmonary status.   IR sedation team needs support from anesthesia for sedation.  Will ask IR sedation team to contact anesthesia and determine the timing of G tube placement.    Attending provider Dr. Leslye Peer notified.   Please call IR for questions and concerns.   Derek Gang Tahmir Kleckner PA-C 06/26/2021 1:44 PM

## 2021-06-26 NOTE — Plan of Care (Signed)
  Problem: Education: Goal: Knowledge of General Education information will improve Description Including pain rating scale, medication(s)/side effects and non-pharmacologic comfort measures Outcome: Progressing   

## 2021-06-26 NOTE — Progress Notes (Signed)
Patient ID: Derek Clarke, male   DOB: [birth year], 65 y.o.   MRN: [id number] [facility] PROGRESS NOTE  Derek Clarke [id number] DOB: 12/13/1955 DOA: 06/13/2021 PCP: Pcp, No  HPI/Subjective: Patient states his breathing is okay.  Patient spends most of his time sitting up.  Patient sleeps in the recliner.  Admitted 12 days ago with acute on chronic hypoxic respiratory failure and postobstructive pneumonia.  Trying to coordinate PEG tube.  Objective: Vitals:   06/26/21 0816 06/26/21 1117  BP: 116/80 99/65  Pulse: 92 99  Resp: 17 18  Temp: 98.2 F (36.8 C) 98.2 F (36.8 C)  SpO2: 100% 99%    Intake/Output Summary (Last 24 hours) at 06/26/2021 1415 Last data filed at 06/26/2021 1402 Gross per 24 hour  Intake 1200 ml  Output --  Net 1200 ml    Filed Weights   06/24/21 0400 06/25/21 0525 06/26/21 0539  Weight: 89.4 kg 89 kg 90.6 kg    ROS: Review of Systems  Respiratory:  Positive for shortness of breath.   Cardiovascular:  Negative for chest pain.  Gastrointestinal:  Negative for abdominal pain, nausea and vomiting.  Exam: Physical Exam HENT:     Head: Normocephalic.     Mouth/Throat:     Pharynx: No oropharyngeal exudate.  Eyes:     General: Lids are normal.     Conjunctiva/sclera: Conjunctivae normal.  Cardiovascular:     Rate and Rhythm: Normal rate and regular rhythm.     Heart sounds: Normal heart sounds, S1 normal and S2 normal.  Pulmonary:     Breath sounds: Examination of the right-lower field reveals decreased breath sounds and rhonchi. Examination of the left-lower field reveals decreased breath sounds and rhonchi. Decreased breath sounds and rhonchi present. No wheezing or rales.  Abdominal:     Palpations: Abdomen is soft.     Tenderness: There is no abdominal tenderness.  Musculoskeletal:     Right lower leg: Swelling present.     Left lower leg: Swelling present.  Skin:    General: Skin is warm.     Findings: No rash.  Neurological:      Mental Status: He is alert and oriented to person, place, and time.      Scheduled Meds:  budesonide (PULMICORT) nebulizer solution  0.5 mg Nebulization BID   docusate sodium  200 mg Oral BID   feeding supplement (NEPRO CARB STEADY)  237 mL Oral TID BM   fluticasone  1 spray Each Nare Daily   furosemide  40 mg Oral Daily   guaiFENesin  600 mg Oral BID   ipratropium-albuterol  3 mL Nebulization Q6H   lactulose  20 g Oral BID   loratadine  10 mg Oral Daily   pantoprazole (PROTONIX) IV  40 mg Intravenous Daily   potassium chloride  40 mEq Oral BID   predniSONE  40 mg Oral Q breakfast   Followed by   Derrill Memo ON 06/28/2021] predniSONE  30 mg Oral Q breakfast   Followed by   Derrill Memo ON 07/02/2021] predniSONE  20 mg Oral Q breakfast   Followed by   Derrill Memo ON 07/06/2021] predniSONE  10 mg Oral Q breakfast   Followed by   Derrill Memo ON 07/10/2021] predniSONE  5 mg Oral Q breakfast    Assessment/Plan:  Dysphagia.  Spoke with interventional radiology team over the weekend to try to coordinate PEG tube.  I will have to speak with the team at [facility] tomorrow.  I did speak with anesthesia  Dr. Wynetta Emery today and IR will have to coordinate with them about the timing of a procedure.  This is the main reason why patient is still in the hospital. Acute on chronic hypoxic respiratory failure.  COPD exacerbation lung cancer and postobstructive pneumonia.  Continue nebulizer treatments.  Acute on chronic hypoxic respiratory failure.  Patient on prednisone taper.  Patient chronically on 4 L of oxygen.  Continue nebulizer treatments. Postobstructive pneumonia.  Completed antibiotics Stage IV at adenocarcinoma of the lung with metastases to liver and bone.  Follows up with Dr. Grayland Ormond oncology as outpatient.  Spoke with the brother that any treatments would be palliative in nature and not curative. Chronic diastolic congestive heart failure.  Continue oral Lasix. Hypokalemia continue replacement and check  electrolytes tomorrow. 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 Anxiety.  On Ativan as needed Nosebleeds and dryness in the nose.  Humidified oxygen and Flonase nasal spray Moderate malnutrition     Code Status:     Code Status Orders  (From admission, onward)           Start     Ordered   06/13/21 2019  Full code  Continuous        06/13/21 2019           Code Status History     Date Active Date Inactive Code Status Order ID Comments User Context   06/07/2021 1821 06/09/2021 2141 Full Code [id number]  Ivor Costa, MD Inpatient   05/15/2021 0309 05/22/2021 2235 Full Code [id number]  Athena Masse, MD ED      Family Communication: Spoke with patient's brother on the phone Disposition Plan: Status is: Inpatient  Dispo:  Patient From: Home  Planned Disposition: Home after PEG placement and tolerating tube feeds.  Medically stable for discharge: No   Time spent: 26 minutes, case discussed with interventional radiology on-call over the weekend and anesthesia on-call over the weekend.  We will have to speak with IR tomorrow.  [place]  [facility] [employer]

## 2021-06-27 ENCOUNTER — Encounter: Payer: Self-pay | Admitting: Internal Medicine

## 2021-06-27 DIAGNOSIS — F419 Anxiety disorder, unspecified: Secondary | ICD-10-CM

## 2021-06-27 LAB — BASIC METABOLIC PANEL
Anion gap: 6 (ref 5–15)
BUN: 20 mg/dL (ref 8–23)
CO2: 33 mmol/L — ABNORMAL HIGH (ref 22–32)
Calcium: 9.1 mg/dL (ref 8.9–10.3)
Chloride: 94 mmol/L — ABNORMAL LOW (ref 98–111)
Creatinine, Ser: 0.48 mg/dL — ABNORMAL LOW (ref 0.61–1.24)
GFR, Estimated: >60 mL/min (ref >60)
Glucose, Bld: 111 mg/dL — ABNORMAL HIGH (ref 70–99)
Potassium: 4.1 mmol/L (ref 3.5–5.1)
Sodium: 133 mmol/L — ABNORMAL LOW (ref 135–145)

## 2021-06-27 LAB — PROTIME-INR
INR: 0.9 (ref 0.8–1.2)
Prothrombin Time: 12 seconds (ref 11.4–15.2)

## 2021-06-27 LAB — CBC
HCT: 38 % — ABNORMAL LOW (ref 39.0–52.0)
Hemoglobin: 13.2 g/dL (ref 13.0–17.0)
MCH: 28.4 pg (ref 26.0–34.0)
MCHC: 34.7 g/dL (ref 30.0–36.0)
MCV: 81.7 fL (ref 80.0–100.0)
Platelets: 134 10*3/uL — ABNORMAL LOW (ref 150–400)
RBC: 4.65 MIL/uL (ref 4.22–5.81)
RDW: 12.3 % (ref 11.5–15.5)
WBC: 3.7 10*3/uL — ABNORMAL LOW (ref 4.0–10.5)
nRBC: 0 % (ref 0.0–0.2)

## 2021-06-27 MED ORDER — CEFAZOLIN SODIUM-DEXTROSE 2-4 GM/100ML-% IV SOLN
2.0000 g | INTRAVENOUS | Status: AC
  Filled 2021-06-27: qty 100

## 2021-06-27 MED ORDER — POTASSIUM CHLORIDE 20 MEQ PO PACK
20.0000 meq | PACK | Freq: Two times a day (BID) | ORAL | Status: DC
  Administered 2021-06-27 – 2021-07-02 (×9): 20 meq via ORAL
  Filled 2021-06-27 (×10): qty 1

## 2021-06-27 MED ORDER — ALPRAZOLAM 0.5 MG PO TABS
0.5000 mg | ORAL_TABLET | Freq: Four times a day (QID) | ORAL | Status: DC | PRN
  Administered 2021-06-27 – 2021-07-01 (×4): 0.5 mg via ORAL
  Filled 2021-06-27 (×4): qty 1

## 2021-06-27 NOTE — Progress Notes (Signed)
Patient ID: Derek Clarke, male   DOB: 11-11-1955, 65 y.o.   MRN: [id number] [facility] PROGRESS NOTE  Derek Clarke HQI:[id number] DOB: 11-25-1955 DOA: 06/13/2021 PCP: Pcp, No  HPI/Subjective: Patient seen during breakfast.  I spoke with the interventional radiologist and they were planning adding on his PEG tube for today but ended up being pushed off till tomorrow.  Patient always has some shortness of breath.  Patient would like to be a full code.  Initially admitted with acute on chronic hypoxic respiratory failure  Objective: Vitals:   06/27/21 1201 06/27/21 1632  BP: 137/90 114/82  Pulse: 94 94  Resp: 18 18  Temp: 97.9 F (36.6 C) 97.9 F (36.6 C)  SpO2: 98% 99%   No intake or output data in the 24 hours ending 06/27/21 1808 Filed Weights   06/25/21 0525 06/26/21 0539 06/27/21 0405  Weight: 89 kg 90.6 kg 91.6 kg    ROS: Review of Systems  Respiratory:  Positive for shortness of breath.   Cardiovascular:  Negative for chest pain.  Gastrointestinal:  Negative for abdominal pain, nausea and vomiting.  Exam: Physical Exam HENT:     Head: Normocephalic.     Mouth/Throat:     Pharynx: No oropharyngeal exudate.  Eyes:     General: Lids are normal.  Cardiovascular:     Rate and Rhythm: Normal rate and regular rhythm.     Heart sounds: Normal heart sounds, S1 normal and S2 normal.  Pulmonary:     Breath sounds: Examination of the right-lower field reveals decreased breath sounds. Examination of the left-lower field reveals decreased breath sounds. Decreased breath sounds present. No wheezing, rhonchi or rales.  Abdominal:     Palpations: Abdomen is soft.     Tenderness: There is no abdominal tenderness.  Musculoskeletal:     Right lower leg: Swelling present.     Left lower leg: Swelling present.  Skin:    General: Skin is warm.     Findings: No rash.  Neurological:     Mental Status: He is alert and oriented to person, place, and time.      Scheduled  Meds:  budesonide (PULMICORT) nebulizer solution  0.5 mg Nebulization BID   docusate sodium  200 mg Oral BID   feeding supplement (NEPRO CARB STEADY)  237 mL Oral TID BM   fluticasone  1 spray Each Nare Daily   furosemide  40 mg Oral Daily   guaiFENesin  600 mg Oral BID   ipratropium-albuterol  3 mL Nebulization Q6H   lactulose  20 g Oral BID   loratadine  10 mg Oral Daily   pantoprazole (PROTONIX) IV  40 mg Intravenous Daily   potassium chloride  20 mEq Oral BID   [START ON 06/28/2021] predniSONE  30 mg Oral Q breakfast   Followed by   Derrill Memo ON 07/02/2021] predniSONE  20 mg Oral Q breakfast   Followed by   Derrill Memo ON 07/06/2021] predniSONE  10 mg Oral Q breakfast   Followed by   Derrill Memo ON 07/10/2021] predniSONE  5 mg Oral Q breakfast    Assessment/Plan:  Dysphagia.  Case discussed with Dr. Pascal Lux interventional radiology for PEG tube today and he was trying to get things done today but will be on for tomorrow.  Acute on chronic hypoxic respiratory failure with COPD exacerbation, lung cancer and postobstructive pneumonia.  Continue nebulizer treatments.  Patient chronically on 4 L of oxygen.  On prednisone taper.  Finished antibiotics. Postobstructive pneumonia.  Completed antibiotics. 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  Patient would like to be a full code.  Follow with Dr. Grayland Ormond as outpatient. Chronic diastolic congestive heart failure.  Continue oral Lasix Hypokalemia.  Continue oral potassium supplementation but at a lower dose Thrombocytopenia Anxiety on as needed Xanax Moderate malnutrition    Code Status:     Code Status Orders  (From admission, onward)           Start     Ordered   06/13/21 2019  Full code  Continuous        06/13/21 2019           Code Status History     Date Active Date Inactive Code Status Order ID Comments User Context   06/07/2021 1821 06/09/2021 2141 Full Code [id number]  Ivor Costa, MD Inpatient    05/15/2021 0309 05/22/2021 2235 Full Code [id number]  Athena Masse, MD ED      Family Communication: The patient does not want me to speak to his brother anymore. Disposition Plan: Status is: Inpatient  Dispo:  Patient From: Home  Planned Disposition: Home after PEG placement and tolerating tube feeds.  Medically stable for discharge: No   Time spent: 28 minutes, case discussed with IR and nursing staff  [employer]  [facility]

## 2021-06-27 NOTE — H&P (Signed)
Chief Complaint: Patient was seen in consultation today for gastrostomy tube placement with anesthesia at the request of Dr. Pearletha Forge.   Referring Physician(s): Dr. Pearletha Forge  Supervising Physician: Sandi Mariscal  Patient Status: [facility] - In-pt  History of Present Illness: Derek Clarke is a 65 y.o. 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 Pt presented to ED on 06/13/21 c/o SOB. Pt was admitted for COPD exacerbation. Request was placed by Dr. Pearletha Forge to place a gastrostomy tube d/t dysphagia.  Pt anatomy was reviewed and approved for gastrotomy tube by Dr. Denna Haggard. He was consented on 06/23/21 for gastrostomy tube. It was found that pt had port placed on 06/03/21 and did not tolerate conscious sedation with extreme agitation during procedure. Pulmonary consult was recommended and patient was evaluated by Dr. Mortimer Fries on 9/30, no further recommendation to maximize pt pulmonary status. IR team now requesting support from anesthesia for sedation during G-tube placement. Pt was seen and consented for gastrostomy tube placement with anesthesia today.  Past Medical History:  Diagnosis Date   Adenocarcinoma ([place])    lung; stage 4 with mets to liver   Chronic respiratory failure with hypoxia (HCC)    baseline 4 L [place]   COPD (chronic obstructive pulmonary disease) (HCC)    GAD (generalized anxiety disorder)     Past Surgical History:  Procedure Laterality Date   IR IMAGING GUIDED PORT INSERTION  06/03/2021    Allergies: Patient has no known allergies.  Medications: Prior to Admission medications   Medication Sig Start Date End Date Taking? Authorizing Provider  albuterol (VENTOLIN HFA) 108 (90 Base) MCG/ACT inhaler Inhale 2 puffs into the lungs every 6 (six) hours as needed for wheezing or shortness of breath. 05/21/21  Yes Lorella Nimrod, MD  ALPRAZolam Duanne Moron) 0.5 MG tablet Take 1 tablet (0.5 mg total) by  mouth 2 (two) times daily as needed for anxiety. 06/10/21  Yes Lloyd Huger, MD  busPIRone (BUSPAR) 5 MG tablet Take 1 tablet (5 mg total) by mouth 3 (three) times daily. 06/09/21  Yes Mercy Riding, MD  chlorpheniramine (CHLOR-TRIMETON) 4 MG tablet Take 4 mg by mouth 2 (two) times daily as needed for allergies.   Yes [provider]  diphenhydrAMINE (BENADRYL) 25 MG tablet Take 25 mg by mouth every 6 (six) hours as needed.   Yes [provider]  fluticasone furoate-vilanterol (BREO ELLIPTA) 100-25 MCG/INH AEPB Inhale 1 puff into the lungs daily. 06/09/21  Yes Mercy Riding, MD  guaiFENesin (MUCINEX) 600 MG 12 hr tablet Take 600 mg by mouth 2 (two) times daily as needed for cough or to loosen phlegm.   Yes [provider]  ibuprofen (ADVIL) 400 MG tablet Take 200 mg by mouth every 4 (four) hours as needed for mild pain.   Yes [provider]  Multiple Vitamin (MULTIVITAMIN WITH MINERALS) TABS tablet Take 1 tablet by mouth daily. 05/22/21  Yes Lorella Nimrod, MD  feeding supplement (ENSURE ENLIVE / ENSURE PLUS) LIQD Take 237 mLs by mouth 2 (two) times daily between meals. 05/22/21   Lorella Nimrod, MD  prochlorperazine (COMPAZINE) 10 MG tablet Take 1 tablet (10 mg total) by mouth every 6 (six) hours as needed (Nausea or vomiting). 05/27/21   Lloyd Huger, MD     Family History  Problem Relation Age of Onset   Lung disease Mother    Dementia Father  Social History   Socioeconomic History   Marital status: Single    Spouse name: Not on file   Number of children: Not on file   Years of education: Not on file   Highest education level: Not on file  Occupational History   Not on file  Tobacco Use   Smoking status: Former    Packs/day: 1.00    Years: 30.00    Pack years: 30.00    Types: Cigarettes    Quit date: 03/26/2021    Years since quitting: 0.2   Smokeless tobacco: Never  Substance and Sexual Activity   Alcohol use: Yes   Drug use: Yes     Types: Marijuana    Comment: quit approx. 5 years ago   Sexual activity: Not on file  Other Topics Concern   Not on file  Social History Narrative   Lives by himself. Brother Frederick "[place] stay with pt. If needed    Social Determinants of Health   Financial Resource Strain: Not on file  Food Insecurity: Not on file  Transportation Needs: Not on file  Physical Activity: Not on file  Stress: Not on file  Social Connections: Not on file     Review of Systems: A 12 point ROS discussed and pertinent positives are indicated in the HPI above.  All other systems are negative.  Review of Systems  Constitutional:  Negative for chills, fatigue and fever.  HENT:  Positive for nosebleeds.   Respiratory:  Positive for cough, chest tightness and shortness of breath.   Cardiovascular:  Positive for leg swelling. Negative for chest pain.  Gastrointestinal:  Positive for blood in stool. Negative for abdominal pain, diarrhea, nausea and vomiting.  Genitourinary:  Negative for hematuria.  Neurological:  Negative for dizziness, weakness and headaches.   Vital Signs: BP (!) 121/92 (BP Location: Left Arm)   Pulse 100   Temp 97.9 F (36.6 C)   Resp 18   Ht _0  (1.753 m)   Wt 202 lb (91.6 kg)   SpO2 99%   BMI 29.83 kg/m   Physical Exam Vitals reviewed.  Constitutional:      Appearance: He is ill-appearing.  HENT:     Head: Normocephalic and atraumatic.     Mouth/Throat:     Mouth: Mucous membranes are dry.     Pharynx: Oropharynx is clear.  Cardiovascular:     Rate and Rhythm: Regular rhythm. Tachycardia present.     Heart sounds: No murmur heard. Pulmonary:     Effort: Pulmonary effort is normal. No respiratory distress.     Breath sounds: No stridor. Examination of the left-upper field reveals wheezing. Examination of the left-middle field reveals wheezing. Examination of the left-lower field reveals wheezing. Wheezing present. No rhonchi or rales.  Abdominal:      General: There is no distension.     Palpations: Abdomen is soft.     Tenderness: There is no abdominal tenderness. There is no guarding.  Musculoskeletal:     Right lower leg: Edema present.     Left lower leg: Edema present.  Skin:    General: Skin is warm and dry.  Neurological:     Mental Status: He is alert and oriented to person, place, and time.  Psychiatric:        Mood and Affect: Mood normal.        Behavior: Behavior normal.        Thought Content: Thought content normal.  Judgment: Judgment normal.    Imaging: DG Chest 2 View  Result Date: 06/23/2021 CLINICAL DATA:  COPD exacerbation EXAM: CHEST - 2 VIEW COMPARISON:  06/14/2021 FINDINGS: Frontal and lateral views of the chest demonstrates stable right chest wall port. The cardiac silhouette is unremarkable. Dense left upper and right middle lobe consolidation again noted. Chronic vascular congestion. No effusion or pneumothorax. IMPRESSION: 1. Persistent left upper and right middle lobe consolidation consistent with postobstructive changes based on hilar masses seen on previous CT. 2. Chronic vascular congestion. Electronically Signed   By: Randa Ngo M.D.   On: 06/23/2021 20:19   CT Angio Chest PE W and/or Wo Contrast  Result Date: 06/07/2021 CLINICAL DATA:  Tachycardia, vomiting, metastatic lung cancer EXAM: CT ANGIOGRAPHY CHEST WITH CONTRAST TECHNIQUE: Multidetector CT imaging of the chest was performed using the standard protocol during bolus administration of intravenous contrast. Multiplanar CT image reconstructions and MIPs were obtained to evaluate the vascular anatomy. CONTRAST:  57m OMNIPAQUE IOHEXOL 350 MG/ML SOLN COMPARISON:  06/08/2011, 05/15/2021, FINDINGS: Cardiovascular: This is a technically adequate evaluation of the pulmonary vasculature. No filling defects or pulmonary emboli. Stable left hilar soft tissue mass encasing the left upper and left lower pulmonary arteries. Stable trace pericardial  effusion. The heart is not enlarged. No evidence of thoracic aortic aneurysm or dissection. Mild atherosclerosis of the aorta and coronary vessels. Mediastinum/Nodes: There is segmental wall thickening of the mid to distal thoracic esophagus. Confluent soft tissue within the mediastinum and bilateral hila consistent with adenopathy, not grossly changed since prior study. An enlarged left supraclavicular lymph node on image 9/4 measures 13 mm in short axis, unchanged. Thyroid and trachea are unremarkable. Lungs/Pleura: There are trace bilateral pleural effusions. Since the prior exam, there is progressive near complete consolidation of the left upper lobe, which may reflect progressive disease and/or postobstructive change. There is diffuse bilateral bronchial wall thickening, greatest at the lung bases. Continued right middle lobe collapse with occlusion of the right middle lobe bronchus again noted. Upper Abdomen: There are stable bilateral adrenal nodules, measuring up to 2.2 cm on the right. Multiple enlarged lymph nodes are again seen within the central upper abdomen, largest measuring 1.7 cm just posterior to the gastric cardia. Multiple hypodensities are again seen within the liver consistent with metastatic disease, grossly unchanged since prior study. Musculoskeletal: Stable sclerotic focus within the left T10 transverse process and within the T9 vertebral body, compatible with bony metastases. No acute displaced fractures. Reconstructed images demonstrate no additional findings. Review of the MIP images confirms the above findings. IMPRESSION: 1. No evidence of pulmonary embolus. 2. Persistent left hilar mass surrounding the left hilar vessels, with increasing near total consolidation of the left upper lobe since prior study. This may be progression of malignancy versus postobstructive change. 3. Continued coalescing lymphadenopathy throughout the mediastinum and bilateral hila. 4. Bilateral bronchial wall  thickening, greatest at the lung bases. 5. Trace bilateral pleural effusions. 6. Segmental wall thickening of the mid to distal thoracic esophagus, which could be inflammatory or infectious. 7. Upper abdominal metastases with continued multiple liver lesions, bilateral adrenal nodularity, and upper abdominal adenopathy. Electronically Signed   By: MRanda NgoM.D.   On: 06/07/2021 15:12   NM PET Image Initial (PI) Skull Base To Thigh  Result Date: 06/14/2021 CLINICAL DATA:  Initial treatment strategy for lung nodule. Lymphadenopathy with liver lesions. EXAM: NUCLEAR MEDICINE PET SKULL BASE TO THIGH TECHNIQUE: 12.0 mCi F-18 FDG was injected intravenously. Full-ring PET imaging was performed from  the skull base to thigh after the radiotracer. CT data was obtained and used for attenuation correction and anatomic localization. Fasting blood glucose: 122 mg/dl COMPARISON:  CTA Chest 06/07/2021 FINDINGS: Mediastinal blood pool activity: SUV max 0.8 Liver activity: SUV max NA NECK: 12 mm left cervical node on 08/6 is hypermetabolic with SUV max = 4.3. Small hypermetabolic lymph nodes evident right lower neck. Bilateral supraclavicular hypermetabolic lymph nodes including 11 mm left supraclavicular node on 60/3 with SUV max = 7.4. Markedly Markedly hypermetabolic left thyroid nodule measures 1.4 cm on image 60/3 with SUV max = 21.7. Incidental CT findings: none CHEST: Hypermetabolic mediastinal lymphadenopathy evident. Index subcarinal measures 18 mm short axis on 01/02/3 with SUV max = 7.0 hypermetabolic lymph nodes are seen in both hilar regions into the prevascular space. 16 mm prevascular node on 87/3 demonstrates SUV max = 7.0. Marked wall thickening noted in the mid esophagus, at approximately the level of the carina. Although somewhat difficult to assess given the adjacent hypermetabolic lymphadenopathy, there does appear to be marked hypermetabolism in the wall of this abnormal appearing esophagus with SUV  max = 7.7. Areas of collapse/consolidation in the left upper lobe, right middle lobe and infrahilar left lower lobe show substantial hypermetabolism. There is diffuse interstitial opacity in both lungs with innumerable tiny bilateral pulmonary nodules. Incidental CT findings: Trace pericardial effusion. Right Port-A-Cath tip is positioned in the distal SVC. Coronary artery calcification is evident. Mild atherosclerotic calcification is noted in the wall of the thoracic aorta. ABDOMEN/PELVIS: Multiple hypermetabolic liver lesions evident. 18 mm subcapsular lesion anterior left liver on 150/3 demonstrates SUV max = 7.7. Hypermetabolic gastrohepatic ligament lymph nodes measure up to 14 mm on 150/3 with SUV max = 6.6 12 mm left para-aortic node on 761/9 is hypermetabolic with SUV max = 4.4. 2.4 cm right adrenal nodule shows low attenuation, suggesting adenoma with associated hypermetabolism SUV max = 5.3. Incidental CT findings: Prostate gland is enlarged. Scattered diverticuli noted left colon without diverticulitis. There is mild atherosclerotic calcification of the abdominal aorta without aneurysm. SKELETON: Scattered hypermetabolic bone metastases evident. 12 mm posterior right iliac sclerotic lesion on 02/20 8/3 shows SUV max = 5.6. Sacral metastases demonstrate SUV max = 5.3 subtle 13 mm left femoral neck lesion demonstrates SUV max = 5.3. Incidental CT findings: none IMPRESSION: 1. Similar right middle and left upper lobe collapse with associated marked hypermetabolism. There is diffuse interstitial and micro nodularity in both lungs consistent with metastatic disease. 2. Hypermetabolic metastatic lymphadenopathy in the neck, mediastinum, hilar regions, gastrohepatic ligament, and retroperitoneum. 3. Marked hypermetabolism identified in the mid esophagus with associated esophageal wall thickening. This may be related to secondary involvement although primary esophageal neoplasm not excluded. 4. Hypermetabolic  liver and bone metastases. 5. Hypermetabolic right adrenal nodule. This is felt to be likely an adenoma although metastatic involvement is certainly possible. 6. Hypermetabolic left thyroid nodule. Recommend thyroid US and biopsy. (Ref: J Am Coll Radiol. 2015 Feb;12(2): 143-50). 7.  Aortic Atherosclerois (ICD10-170.0) Electronically Signed   By: Misty Stanley M.D.   On: 06/14/2021 12:27   DG Chest Port 1 View  Result Date: 06/14/2021 CLINICAL DATA:  Dyspnea EXAM: PORTABLE CHEST 1 VIEW COMPARISON:  06/13/2021 FINDINGS: Cardiac shadow is stable. Right chest wall port is again seen and stable. Volume loss in the left hemithorax is noted with diffuse increased airspace opacity stable from the previous day. Mild right basilar opacity is noted stable in appearance as well. No new focal abnormality is noted. IMPRESSION:  Stable bilateral airspace opacities similar to that seen on recent CT and plain film examination. Electronically Signed   By: Inez Catalina M.D.   On: 06/14/2021 19:39   DG Chest Port 1 View  Result Date: 06/13/2021 CLINICAL DATA:  Shortness of breath.  Recent pneumonia. EXAM: PORTABLE CHEST 1 VIEW COMPARISON:  Radiograph and chest CT 06/07/2021. FINDINGS: Right chest port remains in place. Similar volume loss in the left hemithorax with diffusely increased opacity, opacity throughout the left upper lobe with seen on recent CT. Opacity at the right lung base corresponds to right middle lobe collapse. Background bronchial thickening. Trace pleural effusions on CT are not well demonstrated by radiograph. No pneumothorax or significant change from prior imaging. Heart size is grossly stable. IMPRESSION: 1. No significant change from prior imaging. 2. Volume loss in the left hemithorax with diffusely increased opacity throughout the left upper lung zone, upper lobe consolidation seen on recent CT. 3. Right lung base opacity corresponds to right middle lobe collapse. 4. Background bronchial thickening.  Electronically Signed   By: Keith Rake M.D.   On: 06/13/2021 18:36   DG Chest Portable 1 View  Result Date: 06/07/2021 CLINICAL DATA:  Cough, shortness of breath. EXAM: PORTABLE CHEST 1 VIEW COMPARISON:  May 14, 2021. FINDINGS: Stable cardiomediastinal silhouette. Interval placement of right internal jugular Port-A-Cath with distal tip in expected position of the SVC. No pneumothorax is noted. Left upper lobe consolidation is noted concerning for pneumonia or atelectasis. Probable central pulmonary vascular congestion is noted. Bony thorax is unremarkable. IMPRESSION: Left upper lobe opacity is noted concerning for pneumonia or atelectasis. Electronically Signed   By: Marijo Conception M.D.   On: 06/07/2021 12:13   ECHOCARDIOGRAM COMPLETE  Result Date: 06/08/2021    ECHOCARDIOGRAM REPORT   Patient Name:   Derek Clarke Date of Exam: 06/08/2021 Medical Rec #:  [id number]      Height:       69.0 in Accession #:    [id number]     Weight:       220.7 lb Date of Birth:  April 08, 1956      BSA:          2.154 m Patient Age:    34 years       BP:           130/81 mmHg Patient Gender: M              HR:           108 bpm. Exam Location:  [facility] Procedure: 2D Echo, Cardiac Doppler, Color Doppler and Strain Analysis Indications:    CHF-acute diastolic V79.44  History:        Patient has no prior history of Echocardiogram examinations.                 COPD.  Sonographer:    Sherrie Sport Referring Phys: [id number] Ivor Costa Diagnosing      Kate Sable MD Phys:  Sonographer Comments: Suboptimal apical window. Global longitudinal strain was attempted. IMPRESSIONS  1. Left ventricular ejection fraction, by estimation, is 65 to 70%. The left ventricle has normal function. The left ventricle has no regional wall motion abnormalities. Left ventricular diastolic parameters are consistent with Grade I diastolic dysfunction (impaired relaxation).  2. Right ventricular systolic function is normal. The right ventricular size is  mildly enlarged.  3. A small pericardial effusion is present.  4. The mitral valve is normal in structure. No evidence of mitral valve  regurgitation.  5. The aortic valve is tricuspid. Aortic valve regurgitation is not visualized. FINDINGS  Left Ventricle: Left ventricular ejection fraction, by estimation, is 65 to 70%. The left ventricle has normal function. The left ventricle has no regional wall motion abnormalities. Global longitudinal strain performed but not reported based on interpreter judgement due to suboptimal tracking. 3D left ventricular ejection fraction analysis performed but not reported based on interpreter judgement due to suboptimal quality. The left ventricular internal cavity size was normal in size. There is no left ventricular hypertrophy. Left ventricular diastolic parameters are consistent with Grade I diastolic dysfunction (impaired relaxation). Right Ventricle: The right ventricular size is mildly enlarged. No increase in right ventricular wall thickness. Right ventricular systolic function is normal. Left Atrium: Left atrial size was normal in size. Right Atrium: Right atrial size was normal in size. Pericardium: A small pericardial effusion is present. Mitral Valve: The mitral valve is normal in structure. No evidence of mitral valve regurgitation. Tricuspid Valve: The tricuspid valve is grossly normal. Tricuspid valve regurgitation is trivial. Aortic Valve: The aortic valve is tricuspid. Aortic valve regurgitation is not visualized. Aortic valve mean gradient measures 6.0 mmHg. Aortic valve peak gradient measures 10.5 mmHg. Aortic valve area, by VTI measures 2.89 cm. Pulmonic Valve: The pulmonic valve was not well visualized. Pulmonic valve regurgitation is not visualized. Aorta: The aortic root is normal in size and structure. IAS/Shunts: No atrial level shunt detected by color flow Doppler.  LEFT VENTRICLE PLAX 2D LVIDd:         4.80 cm  Diastology LVIDs:         2.60 cm  LV e'  medial:   1.96 cm/s LV PW:         1.30 cm  LV E/e' medial: 38.7 LV IVS:        1.20 cm LVOT diam:     2.10 cm LV SV:         76 LV SV Index:   35 LVOT Area:     3.46 cm 3D Volume EF:                         3D EF:        52 %                         LV EDV:       96 ml                         LV ESV:       46 ml                         LV SV:        50 ml RIGHT VENTRICLE RV S prime:     14.00 cm/s TAPSE (M-mode): 5.7 cm LEFT ATRIUM             Index       RIGHT ATRIUM           Index LA diam:        3.30 cm 1.53 cm/m  RA Area:     21.00 cm LA Vol (A2C):   64.2 ml 29.80 ml/m RA Volume:   55.60 ml  25.81 ml/m LA Vol (A4C):   31.2 ml 14.48 ml/m LA Biplane Vol: 45.4 ml 21.07 ml/m  AORTIC VALVE  PULMONIC VALVE AV Area (Vmax):    2.39 cm     PV Vmax:       0.73 m/s AV Area (Vmean):   2.78 cm     PV Peak grad:  2.1 mmHg AV Area (VTI):     2.89 cm AV Vmax:           162.00 cm/s AV Vmean:          108.000 cm/s AV VTI:            0.261 m AV Peak Grad:      10.5 mmHg AV Mean Grad:      6.0 mmHg LVOT Vmax:         112.00 cm/s LVOT Vmean:        86.800 cm/s LVOT VTI:          0.218 m LVOT/AV VTI ratio: 0.84  AORTA Ao Root diam: 3.30 cm MITRAL VALVE                TRICUSPID VALVE MV Area (PHT): 3.95 cm     TR Peak grad:   30.0 mmHg MV Decel Time: 192 msec     TR Vmax:        274.00 cm/s MV E velocity: 75.80 cm/s MV A velocity: 119.00 cm/s  SHUNTS MV E/A ratio:  0.64         Systemic VTI:  0.22 m                             Systemic Diam: 2.10 cm Kate Sable MD Electronically signed by Kate Sable MD Signature Date/Time: 06/08/2021/3:10:53 PM    Final    IR IMAGING GUIDED PORT INSERTION  Result Date: 06/03/2021 INDICATION: Metastatic lung cancer.  Chemotherapy. EXAM: IMPLANTED PORT A CATH PLACEMENT WITH ULTRASOUND AND FLUOROSCOPIC GUIDANCE MEDICATIONS: None ANESTHESIA/SEDATION: Moderate (conscious) sedation was employed during this procedure. A total of Versed 0 mg and Fentanyl 25 mcg  was administered intravenously. Moderate Sedation Time: 20 minutes. The patient's level of consciousness and vital signs were monitored continuously by radiology nursing throughout the procedure under my direct supervision. FLUOROSCOPY TIME:  0 minutes, 24 seconds (4.5 mGy) COMPLICATIONS: None immediate. PROCEDURE: The procedure, risks, benefits, and alternatives were explained to the patient. Questions regarding the procedure were encouraged and answered. The patient understands and consents to the procedure. The neck and chest were prepped with chlorhexidine in a sterile fashion, and a sterile drape was applied covering the operative field. Maximum barrier sterile technique with sterile gowns and gloves were used for the procedure. A timeout was performed prior to the initiation of the procedure. Local anesthesia was provided with 1% lidocaine with epinephrine. After creating a small venotomy incision, a micropuncture kit was utilized to access the internal jugular vein under direct, real-time ultrasound guidance. Ultrasound image documentation was performed. The microwire was kinked to measure appropriate catheter length. A subcutaneous port pocket was then created along the upper chest wall utilizing a combination of sharp and blunt dissection. The pocket was irrigated with sterile saline. A single lumen ISP power injectable port was chosen for placement. The 8 Fr catheter was tunneled from the port pocket site to the venotomy incision. The port was placed in the pocket. The external catheter was trimmed to appropriate length. At the venotomy, an 8 Fr peel-away sheath was placed over a guidewire under fluoroscopic guidance. The catheter was then placed through the sheath and the sheath  was removed. Final catheter positioning was confirmed and documented with a fluoroscopic spot radiograph. The port was accessed with a Huber needle, aspirated and flushed with heparinized saline. The port pocket incision was  closed with interrupted 3-0 Vicryl suture then Dermabond was applied, including at the venotomy incision. Dressings were placed. The patient tolerated the procedure well without immediate post procedural complication. IMPRESSION: Successful placement of a right internal jugular approach power injectable Port-A-Cath. The catheter is ready for immediate use. Michaelle Birks, MD [facility] Electronically Signed   By: Michaelle Birks M.D.   On: 06/03/2021 13:26    Labs:  CBC: Recent Labs    06/21/21 0320 06/22/21 0331 06/24/21 0555 06/27/21 0415  WBC 13.5* 7.7 4.3 3.7*  HGB 14.5 14.4 13.8 13.2  HCT 43.5 43.3 39.2 38.0*  PLT 120* 96* 96* 134*    COAGS: Recent Labs    05/19/21 0815 06/07/21 1809 06/27/21 0415  INR 1.0 1.0 0.9    BMP: Recent Labs    06/22/21 0331 06/24/21 0555 06/25/21 0646 06/27/21 0415  NA 133* 132* 133* 133*  K 3.6 2.8* 3.1* 4.1  CL 89* 88* 89* 94*  CO2 34* 36* 36* 33*  GLUCOSE 120* 100* 99 111*  BUN 26* 27* 22 20  CALCIUM 8.7* 8.5* 8.8* 9.1  CREATININE 0.51* 0.48* 0.50* 0.48*  GFRNONAA >60 >60 >60 >60    LIVER FUNCTION TESTS: Recent Labs    05/14/21 2306 06/07/21 1050 06/14/21 0420  BILITOT 1.1 1.1 0.9  AST _0 ALT 33 19 26  ALKPHOS 100 109 111  PROT 6.7 7.2 6.7  ALBUMIN 3.7 3.3* 3.3*    TUMOR MARKERS: No results for input(s): AFPTM, CEA, CA199, CHROMGRNA in the last 8760 hours.  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 Pt presented to ED on 06/13/21 c/o SOB. Pt was admitted for COPD exacerbation. Request was placed by Dr. Pearletha Forge to place a gastrostomy tube d/t dysphagia. Pt anatomy was reviewed and approved for gastrotomy tube by Dr. Denna Haggard. He was consented on 06/23/21 for gastrostomy tube. It was found that pt had port placed on 06/03/21 and did not tolerate conscious  sedation with extreme agitation during procedure. Pulmonary consult was recommended and patient was evaluated by Dr. Mortimer Fries on 9/30, no further recommendation to maximize pt pulmonary status. IR team now requesting support from anesthesia for sedation during G-tube placement.Pt was seen and consented for gastrostomy tube placement with anesthesia today.  Pt observed standing in from of sink and washing his face, walking around room with O2 in place via [place].   He is A&O, calm and pleasant.  Pt states he last drank Ensure and ate [place] ice around 0915. RN confirms. Pt agrees to NPO until procedure.   Pt is not on blood thinning medication.  Pt VSS  Labs 06/27/21: WBC 3.7 INR 0.9     Risks and benefits image guided gastrostomy tube placement with anesthesia was discussed with the patient including, but not limited to the need for a barium enema during the procedure, bleeding, infection, peritonitis and/or damage to adjacent structures.  All of the patient's questions were answered, patient is agreeable to proceed.  Consent signed and in chart.   Thank you for this interesting consult.  I greatly enjoyed meeting Derek Clarke and look forward to participating in their care.  A copy  of this report was sent to the requesting provider on this date.  Electronically Signed: Tyson Alias, NP 06/27/2021, 10:41 AM   I spent a total of 30 minutes in face to face in clinical consultation, greater than 50% of which was counseling/coordinating care for image guided gastrostomy tube placement with anesthesia.

## 2021-06-27 NOTE — TOC Progression Note (Signed)
Transition of Care [facility]) - Progression Note    Patient Details  Name: Derek Clarke MRN: [id number] Date of Birth: 07/23/1956  Transition of Care [facility]) CM/SW Contact  Beverly Sessions, RN Phone Number: 06/27/2021, 10:46 AM  Clinical Narrative:      Timing of gtube placement has not yet been determined    Expected Discharge Plan: Home/Self Care Barriers to Discharge: Continued Medical Work up  Expected Discharge Plan and Services Expected Discharge Plan: Home/Self Care       Living arrangements for the past 2 months: Single Family Home                                       Social Determinants of Health (SDOH) Interventions    Readmission Risk Interventions No flowsheet data found.

## 2021-06-27 NOTE — Progress Notes (Signed)
PT Cancellation Note  Patient Details Name: RMANI KELLOGG MRN: [id number] DOB: October 31, 1955   Cancelled Treatment:    Reason Eval/Treat Not Completed: Patient declined to participate with PT services this date secondary to hunger/fatigue related to being NPO for a procedure this date that was ultimately postponed.  Will attempt to see pt at a future date/time as medically appropriate.    Linus Salmons PT, DPT 06/27/21, 4:46 PM

## 2021-06-28 ENCOUNTER — Inpatient Hospital Stay: Payer: Self-pay | Admitting: Anesthesiology

## 2021-06-28 ENCOUNTER — Ambulatory Visit: Payer: Self-pay

## 2021-06-28 ENCOUNTER — Inpatient Hospital Stay: Payer: Self-pay

## 2021-06-28 ENCOUNTER — Inpatient Hospital Stay: Payer: Self-pay | Admitting: Radiology

## 2021-06-28 HISTORY — PX: IR GASTROSTOMY TUBE MOD SED: IMG625

## 2021-06-28 MED ORDER — LIDOCAINE HCL (PF) 1 % IJ SOLN
INTRAMUSCULAR | Status: DC | PRN
  Administered 2021-06-28: 1 mL via SUBCUTANEOUS

## 2021-06-28 MED ORDER — KETAMINE HCL 50 MG/5ML IJ SOSY
PREFILLED_SYRINGE | INTRAMUSCULAR | Status: AC
  Filled 2021-06-28: qty 5

## 2021-06-28 MED ORDER — SODIUM CHLORIDE FLUSH 0.9 % IV SOLN
INTRAVENOUS | Status: AC
  Filled 2021-06-28: qty 10

## 2021-06-28 MED ORDER — SIMETHICONE 40 MG/0.6ML PO SUSP
80.0000 mg | Freq: Once | ORAL | Status: AC
  Administered 2021-06-29: 80 mg
  Filled 2021-06-28: qty 1.2

## 2021-06-28 MED ORDER — GLUCAGON HCL RDNA (DIAGNOSTIC) 1 MG IJ SOLR
INTRAMUSCULAR | Status: DC | PRN
Start: 2021-06-28 — End: 2021-06-28
  Administered 2021-06-28: 1 mg via INTRAVENOUS

## 2021-06-28 MED ORDER — GLUCAGON HCL RDNA (DIAGNOSTIC) 1 MG IJ SOLR
INTRAMUSCULAR | Status: AC
  Filled 2021-06-28: qty 1

## 2021-06-28 MED ORDER — DEXMEDETOMIDINE HCL IN NACL 200 MCG/50ML IV SOLN
INTRAVENOUS | Status: DC | PRN
  Administered 2021-06-28 (×2): 10 ug via INTRAVENOUS

## 2021-06-28 MED ORDER — LIDOCAINE HCL 4 % EX SOLN
CUTANEOUS | Status: DC | PRN
  Administered 2021-06-28: 3 mL via TOPICAL

## 2021-06-28 MED ORDER — LIDOCAINE HCL (CARDIAC) PF 100 MG/5ML IV SOSY
PREFILLED_SYRINGE | INTRAVENOUS | Status: DC | PRN
  Administered 2021-06-28: 40 mg via INTRAVENOUS

## 2021-06-28 MED ORDER — ROPIVACAINE HCL 5 MG/ML IJ SOLN
INTRAMUSCULAR | Status: AC
  Filled 2021-06-28: qty 30

## 2021-06-28 MED ORDER — IOHEXOL 350 MG/ML SOLN: 25.0000 mL | Freq: Once | INTRAVENOUS | Status: DC | PRN

## 2021-06-28 MED ORDER — FENTANYL CITRATE PF 50 MCG/ML IJ SOSY: 50.0000 ug | PREFILLED_SYRINGE | Freq: Once | INTRAMUSCULAR | Status: DC

## 2021-06-28 MED ORDER — ROPIVACAINE HCL 5 MG/ML IJ SOLN
INTRAMUSCULAR | Status: DC | PRN
  Administered 2021-06-28: 20 mL via EPIDURAL

## 2021-06-28 MED ORDER — GLYCOPYRROLATE 0.2 MG/ML IJ SOLN
INTRAMUSCULAR | Status: DC | PRN
  Administered 2021-06-28: .2 mg via INTRAVENOUS

## 2021-06-28 MED ORDER — MORPHINE SULFATE (PF) 2 MG/ML IV SOLN
2.0000 mg | INTRAVENOUS | Status: DC | PRN
  Administered 2021-06-28 (×2): 2 mg via INTRAVENOUS
  Filled 2021-06-28 (×2): qty 1

## 2021-06-28 MED ORDER — PHENYLEPHRINE HCL (PRESSORS) 10 MG/ML IV SOLN
INTRAVENOUS | Status: DC | PRN
  Administered 2021-06-28: 100 ug via INTRAVENOUS

## 2021-06-28 MED ORDER — MIDAZOLAM HCL 2 MG/2ML IJ SOLN: 1.0000 mg | Freq: Once | INTRAMUSCULAR | Status: DC

## 2021-06-28 MED ORDER — MIDAZOLAM HCL 2 MG/2ML IJ SOLN
INTRAMUSCULAR | Status: AC
  Filled 2021-06-28: qty 2

## 2021-06-28 MED ORDER — FENTANYL CITRATE PF 50 MCG/ML IJ SOSY
PREFILLED_SYRINGE | INTRAMUSCULAR | Status: AC
  Filled 2021-06-28: qty 1

## 2021-06-28 MED ORDER — ONDANSETRON HCL 4 MG/2ML IJ SOLN
INTRAMUSCULAR | Status: AC
  Filled 2021-06-28: qty 2

## 2021-06-28 MED ORDER — OXYCODONE HCL 5 MG PO TABS
5.0000 mg | ORAL_TABLET | ORAL | Status: DC | PRN
  Administered 2021-06-29 – 2021-07-02 (×8): 5 mg via ORAL
  Filled 2021-06-28 (×8): qty 1

## 2021-06-28 MED ORDER — FENTANYL CITRATE (PF) 100 MCG/2ML IJ SOLN
INTRAMUSCULAR | Status: AC
  Filled 2021-06-28: qty 2

## 2021-06-28 MED ORDER — LIDOCAINE HCL 1 % IJ SOLN
INTRAMUSCULAR | Status: AC
  Administered 2021-06-28: 5 mL
  Filled 2021-06-28: qty 20

## 2021-06-28 MED ORDER — LIDOCAINE HCL (PF) 1 % IJ SOLN
INTRAMUSCULAR | Status: AC
  Filled 2021-06-28: qty 5

## 2021-06-28 MED ORDER — SODIUM CHLORIDE 0.9 % IV SOLN: INTRAVENOUS | Status: DC | PRN

## 2021-06-28 MED ORDER — DEXAMETHASONE SODIUM PHOSPHATE 10 MG/ML IJ SOLN
INTRAMUSCULAR | Status: AC
  Filled 2021-06-28: qty 1

## 2021-06-28 MED ORDER — MIDAZOLAM HCL 2 MG/2ML IJ SOLN
INTRAMUSCULAR | Status: DC | PRN
  Administered 2021-06-28: 2 mg via INTRAVENOUS

## 2021-06-28 MED ORDER — LIDOCAINE HCL 1 % IJ SOLN
INTRAMUSCULAR | Status: AC
  Administered 2021-06-28: 20 mL
  Filled 2021-06-28: qty 20

## 2021-06-28 MED ORDER — GLYCOPYRROLATE 0.2 MG/ML IJ SOLN
INTRAMUSCULAR | Status: AC
  Filled 2021-06-28: qty 1

## 2021-06-28 MED ORDER — DEXMEDETOMIDINE (PRECEDEX) IN NS 20 MCG/5ML (4 MCG/ML) IV SYRINGE
PREFILLED_SYRINGE | INTRAVENOUS | Status: AC
  Filled 2021-06-28: qty 5

## 2021-06-28 MED ORDER — OXYCODONE HCL 5 MG PO TABS
5.0000 mg | ORAL_TABLET | ORAL | Status: DC | PRN
Start: 2021-06-28 — End: 2021-06-28
  Administered 2021-06-28: 5 mg via ORAL
  Filled 2021-06-28: qty 1

## 2021-06-28 NOTE — Anesthesia Postprocedure Evaluation (Signed)
Anesthesia Post Note  Patient: Derek Clarke  Procedure(s) Performed: IR GASTROSTOMY TUBE MOD SED  Patient location: Specialty recovery. Cardiovascular suite. Anesthesia Type: MAC Level of consciousness: awake and alert Pain management: pain level controlled Vital Signs Assessment: post-procedure vital signs reviewed and stable Respiratory status: spontaneous breathing, nonlabored ventilation, respiratory function stable and patient connected to nasal cannula oxygen Cardiovascular status: stable and blood pressure returned to baseline Postop Assessment: no apparent nausea or vomiting Anesthetic complications: no   No notable events documented.   Last Vitals:  Vitals:   06/28/21 1130 06/28/21 1410  BP: 114/84 106/69  Pulse: 99   Resp: (!) 24 (!) 22  Temp:    SpO2: 98% 100%    Last Pain:  Vitals:   06/28/21 1410  TempSrc:   PainSc: [name]

## 2021-06-28 NOTE — Anesthesia Procedure Notes (Signed)
Anesthesia Regional Block: TAP block (subostal TAP)   Pre-Anesthetic Checklist: , timeout performed,  Correct Patient, Correct Site, Correct Laterality,  Correct Procedure, Correct Position, site marked,  Risks and benefits discussed,  Surgical consent,  Pre-op evaluation,  At surgeon's request and post-op pain management  Laterality: Left  Prep: chloraprep       Needles:  Injection technique: Single-shot  Needle Type: Stimiplex     Needle Length: 5cm  Needle Gauge: 21     Additional Needles:   Procedures:,,,, ultrasound used (permanent image in chart),,    Narrative:  Start time: 06/28/2021 11:20 AM End time: 06/28/2021 11:31 AM Injection made incrementally with aspirations every 20 mL.  Performed by: Personally  Anesthesiologist: Iran Ouch, MD  Additional Notes: Patient consented for risk and benefits of nerve block including but not limited to nerve damage, failed block, bleeding and infection.  Patient voiced understanding.  Functioning IV was confirmed and monitors were applied.  Timeout done prior to procedure and prior to any sedation being given to the patient.  Patient confirmed procedure site prior to any sedation given to the patient.  A 4mm 22ga Stimuplex needle was used. Sterile prep,hand hygiene and sterile gloves were used.  Minimal sedation used for procedure.  No paresthesia endorsed by patient during the procedure.  Negative aspiration and negative test dose prior to incremental administration of local anesthetic. The patient tolerated the procedure well with no immediate complications.

## 2021-06-28 NOTE — Procedures (Signed)
Interventional Radiology Procedure Note  Date of Procedure: 06/28/2021  Procedure: G tube placement   Findings:  1. Successful placement of a 20 Fr G tube using fluoroscopic guidance    Complications: No immediate complications noted.   Estimated Blood Loss: minimal  Follow-up and Recommendations: 1. G tube may be used immediately for meds, and for feeding in 24 hours    Albin Felling, MD  Vascular & Interventional Radiology  06/28/2021 2:37 PM

## 2021-06-28 NOTE — Anesthesia Preprocedure Evaluation (Addendum)
Anesthesia Evaluation  Patient identified by MRN, date of birth, ID band Patient awake    Reviewed: Allergy & Precautions, NPO status , Patient's Chart, lab work & pertinent test results  Airway Mallampati: II  TM Distance: >3 FB Neck ROM: Full    Dental  (+) Poor Dentition, Missing,    Pulmonary shortness of breath, pneumonia, resolved, COPD (recent exacrebation), former smoker,  Adenocarcinoma of lung, stage 4 on chronic oxygen supplementation  Acute on chronic respiratory failure with hypoxia  Chronically on 4 L of oxygen.  On prednisone taper.  Finished antibiotics          Cardiovascular Exercise Tolerance: Poor +CHF (Chronic diastolic CHF) and + DOE  + dysrhythmias (RBBB, PVC, Prolonged QTC )      Neuro/Psych PSYCHIATRIC DISORDERS Anxiety negative neurological ROS     GI/Hepatic Neg liver ROS, Dysphagia   Endo/Other  negative endocrine ROS  Renal/GU negative Renal ROS  negative genitourinary   Musculoskeletal negative musculoskeletal ROS (+)   Abdominal Normal abdominal exam  (+)   Peds negative pediatric ROS (+)  Hematology  (+) Blood dyscrasia (Thrombocytopenia), ,   Anesthesia Other Findings 4L [place]. Voice is diminished.  Metastatic adenocarcinoma of lung with left hilar mass with signs and symptoms of vocal cord paralysis with signs of aspiration.  Stage IV adenocarcinoma of the lung with metastases to liver and bone  Moderate Malnutrition  Pt had port placed on 06/03/21 and did not tolerate conscious sedation with extreme agitation during procedure  Reproductive/Obstetrics negative OB ROS                            Anesthesia Physical Anesthesia Plan  ASA: 4  Anesthesia Plan: MAC   Post-op Pain Management:    Induction: Intravenous  PONV Risk Score and Plan: 1 and Treatment may vary due to age or medical condition  Airway Management Planned: Nasal Cannula and  Natural Airway  Additional Equipment:   Intra-op Plan:   Post-operative Plan:   Informed Consent: I have reviewed the patients History and Physical, chart, labs and discussed the procedure including the risks, benefits and alternatives for the proposed anesthesia with the patient or authorized representative who has indicated his/her understanding and acceptance.     Dental advisory given  Plan Discussed with: CRNA, Anesthesiologist and Surgeon  Anesthesia Plan Comments: (Discussed TAP block. Discussed risk of sedation including aspiration. Discussed back-up general anesthesia with airway securement with ETT and need for possible post-op ventilation in ICU. Pt agrees. )        Anesthesia Quick Evaluation

## 2021-06-28 NOTE — Progress Notes (Addendum)
Initial Nutrition Assessment  DOCUMENTATION CODES:   Non-severe (moderate) malnutrition in context of chronic illness  INTERVENTION:   Once G-tube in place and ready to use, recommend:  Goal tube feed rate- Osmolite 1.5- Give 6 cartons daily- Start with 1/2 carton 6 times daily and advance slowly as tolerated. Flush with 59ml of water before and after each feed.   Pro-Source 37ml TID via tube, provides 40kcal and 11g of protein per serving   Regimen provides 2250kcal/day, 122g/day protein and 1851ml/day free water   Pt at mild-moderate refeed risk; recommend monitor potassium, magnesium and phosphorus labs daily until stable  NUTRITION DIAGNOSIS:   Moderate Malnutrition (in the context of chronic illness) related to dysphagia, inability to eat as evidenced by mild muscle depletion, mild fat depletion, percent weight loss.  GOAL:   Patient will meet greater than or equal to 90% of their needs -progressing   MONITOR:   PO intake, Labs, Weight trends, TF tolerance, Skin, I & O's  ASSESSMENT:   65 y.o. male with medical history significant for stage IV adenocarcinoma of the left lung with metastasis to the liver, chronic hypoxic respiratory failure on 4 L continuous nasal cannula and COPD who is admitted to [facility] on 06/13/2021 with acute on chronic hypoxic respiratory failure after presenting from home to [facility] ED complaining of shortness of breath.  Pt with fairly good appetite and oral intake in hospital; pt eating 50-100% of his pureed diet in hospital and is drinking the Nepro supplements. Pt NPO today for G-tube placement. Will plan to start tube feeds once tube is appropriate to use. Since pt is eating fairly well, will plan to meet 50% of his estimated needs with tube feeds. Recommend for patient to follow up with the Dietitian at the [facility] so tube feeds can be adjusted if patient's oral intake decreases.   Per chart, pt has remained fairly  weight stable since admit.   Medications reviewed and include: colace, lasix, lactulose, protonix, KCl, prednisone   Labs reviewed: Na 133(L), K 4.1 wnl, creat 0.40(L) P 2.8 wnl- 10/1 Wbc- 3.7(L)- 10/3  Diet Order:   Diet Order             Diet NPO time specified  Diet effective now                  EDUCATION NEEDS:   Education needs have been addressed  Skin:  Skin Assessment: Reviewed RN Assessment  Last BM:  10/2  Height:   Ht Readings from Last 1 Encounters:  06/15/21 5\' 9"  (1.753 m)    Weight:   Wt Readings from Last 1 Encounters:  06/27/21 91.6 kg    Ideal Body Weight:  72.7 kg  BMI:  Body mass index is 29.83 kg/m.  Estimated Nutritional Needs:   Kcal:  2100-2400kcal/day  Protein:  105-120g/day  Fluid:  2.2-2.5L/day  Koleen Distance MS, RD, LDN Please refer to [facility] for RD and/or RD on-call/weekend/after hours pager

## 2021-06-28 NOTE — TOC Progression Note (Signed)
Transition of Care [facility]) - Progression Note    Patient Details  Name: Derek Clarke MRN: [id number] Date of Birth: 06-04-1956  Transition of Care [facility]) CM/SW Contact  Beverly Sessions, RN Phone Number: 06/28/2021, 3:29 PM  Clinical Narrative:     G tube was placed this morning  Expected Discharge Plan: Home/Self Care Barriers to Discharge: Continued Medical Work up  Expected Discharge Plan and Services Expected Discharge Plan: Home/Self Care       Living arrangements for the past 2 months: Single Family Home                                       Social Determinants of Health (SDOH) Interventions    Readmission Risk Interventions No flowsheet data found.

## 2021-06-28 NOTE — Progress Notes (Signed)
Patient refused int suction to peg tube  Patient also has reg fluids at bed side from home

## 2021-06-28 NOTE — Progress Notes (Signed)
Pt. Arrived from IR lab, lying on side. Pt. Coughing up mod. Amts. Clear clear phlegm . Suction setup done for pt. Pt. Able to suction self. Called Dr. Denna Haggard : MD states pt.may have sips of H2O.

## 2021-06-28 NOTE — Progress Notes (Signed)
Patient ID: Derek Clarke, male   DOB: [birth year], 65 y.o.   MRN: [id number] [facility] PROGRESS NOTE  HOMMER CUNLIFFE GXQ:[id number] DOB: Dec 15, 1955 DOA: 06/13/2021 PCP: Pcp, No  HPI/Subjective: Patient seen this morning prior to PEG tube.  Patient always has a little shortness of breath.  Feels okay.  Objective: Vitals:   06/28/21 1546 06/28/21 1547  BP: (!) 95/55 104/63  Pulse: 86   Resp: 19   Temp: 98.5 F (36.9 C)   SpO2: 100%     Intake/Output Summary (Last 24 hours) at 06/28/2021 1616 Last data filed at 06/28/2021 1413 Gross per 24 hour  Intake 540 ml  Output 0 ml  Net 540 ml   Filed Weights   06/25/21 0525 06/26/21 0539 06/27/21 0405  Weight: 89 kg 90.6 kg 91.6 kg    ROS: Review of Systems  Respiratory:  Negative for shortness of breath.   Cardiovascular:  Negative for chest pain.  Gastrointestinal:  Negative for abdominal pain, nausea and vomiting.  Exam: Physical Exam HENT:     Head: Normocephalic.     Mouth/Throat:     Pharynx: No oropharyngeal exudate.  Eyes:     General: Lids are normal.     Conjunctiva/sclera: Conjunctivae normal.  Cardiovascular:     Rate and Rhythm: Normal rate and regular rhythm.     Heart sounds: Normal heart sounds, S1 normal and S2 normal.  Pulmonary:     Breath sounds: Normal breath sounds. No decreased breath sounds, wheezing, rhonchi or rales.  Abdominal:     Palpations: Abdomen is soft.     Tenderness: There is no abdominal tenderness.  Musculoskeletal:     Right lower leg: Swelling present.     Left lower leg: Swelling present.  Skin:    General: Skin is warm.     Findings: No rash.  Neurological:     Mental Status: He is alert and oriented to person, place, and time.      Scheduled Meds:  budesonide (PULMICORT) nebulizer solution  0.5 mg Nebulization BID   docusate sodium  200 mg Oral BID   fentaNYL       fentaNYL (SUBLIMAZE) injection  50 mcg Intravenous Once   fluticasone  1 spray Each Nare Daily    furosemide  40 mg Oral Daily   guaiFENesin  600 mg Oral BID   ipratropium-albuterol  3 mL Nebulization Q6H   lactulose  20 g Oral BID   loratadine  10 mg Oral Daily   midazolam       midazolam  1 mg Intravenous Once   pantoprazole (PROTONIX) IV  40 mg Intravenous Daily   potassium chloride  20 mEq Oral BID   predniSONE  30 mg Oral Q breakfast   Followed by   Derrill Memo ON 07/02/2021] predniSONE  20 mg Oral Q breakfast   Followed by   Derrill Memo ON 07/06/2021] predniSONE  10 mg Oral Q breakfast   Followed by   Derrill Memo ON 07/10/2021] predniSONE  5 mg Oral Q breakfast   sodium chloride flush       Continuous Infusions:   ceFAZolin (ANCEF) IV     Brief history.  65 year old man with past medical history of adenocarcinoma of the lung stage IV with mets to liver and bone.,  Chronic hypoxic respiratory failure on 4 L, COPD, anxiety.  He was admitted 14 days ago and treated for acute on chronic hypoxic respiratory failure, postobstructive pneumonia and COPD exacerbation.  The patient's also had dysphagia  and was having some aspiration and it was recommended for PEG tube placement.  Interventional radiology wanted the patient tuned up a little bit before doing a PEG placement.  Patient had PEG placed on 06/28/2021.  Assessment/Plan:  Dysphagia.  Patient eating pured diet with thickened liquids.  Patient had PEG tube placed today.  Likely can start tube feedings tomorrow. Acute on chronic hypoxic respiratory failure with COPD exacerbation, lung cancer and postobstructive pneumonia.  Continue nebulizer treatments.  The patient is chronically on 4 L oxygen.  Continue prednisone taper.  Patient completed antibiotic course while here. Postobstructive pneumonia.  Completed antibiotics Stage IV adenocarcinoma of the lung with metastases to the liver and bone.  Patient is a full code confirmed yesterday.  Follow-up with Dr. Grayland Ormond as outpatient. Chronic diastolic congestive heart failure on low-dose  Lasix Hypokalemia.  Continue supplementation.  Once tube feeds started hopefully can discontinue or decrease supplementation. Thrombocytopenia, likely from recent chemo.  Last platelet count 134. Moderate malnutrition.  Hopefully PEG tube and feeding will supplement. Anxiety on Xanax.     Code Status:     Code Status Orders  (From admission, onward)           Start     Ordered   06/13/21 2019  Full code  Continuous        06/13/21 2019           Code Status History     Date Active Date Inactive Code Status Order ID Comments User Context   06/07/2021 1821 06/09/2021 2141 Full Code [id number]  Ivor Costa, MD Inpatient   05/15/2021 0309 05/22/2021 2235 Full Code [id number]  Athena Masse, MD ED      Family Communication: The patient does not want his brother called anymore. Disposition Plan: Status is: Inpatient  Dispo:  Patient From: Home  Planned Disposition: Since PEG tube placed today, likely can start tube feedings tomorrow and if they go well can potentially be discharged on 06/30/2021.  Medically stable for discharge: No   Consultants: Oncology Pulmonary Interventional radiology  Procedures: PEG tube placed  Antibiotics: Completed  Time spent: 26 minutes  [place]

## 2021-06-28 NOTE — Progress Notes (Signed)
Patient here today for G tube placement per DR El-Abd, under general anesthesia. Being monitored  per CRNA for procedure.  Timeout pre procedure start prior and charted.

## 2021-06-28 NOTE — Transfer of Care (Signed)
Immediate Anesthesia Transfer of Care Note  Patient: Derek Clarke  Procedure(s) Performed: IR GASTROSTOMY TUBE MOD SED  Patient Location: Special Procedures  Anesthesia Type:MAC  Level of Consciousness: awake, alert , oriented and patient cooperative  Airway & Oxygen Therapy: Patient Spontanous Breathing  Post-op Assessment: Report given to RN and Post -op Vital signs reviewed and stable  Post vital signs: Reviewed and stable  Last Vitals: see EPIC Vitals Value Taken Time  BP    Temp    Pulse    Resp    SpO2      Last Pain:  Vitals:   06/28/21 1130  TempSrc:   PainSc: 1          Complications: No notable events documented.

## 2021-06-29 ENCOUNTER — Inpatient Hospital Stay: Payer: Self-pay

## 2021-06-29 DIAGNOSIS — E871 Hypo-osmolality and hyponatremia: Secondary | ICD-10-CM

## 2021-06-29 LAB — CBC
HCT: 41.6 % (ref 39.0–52.0)
Hemoglobin: 13.9 g/dL (ref 13.0–17.0)
MCH: 28 pg (ref 26.0–34.0)
MCHC: 33.4 g/dL (ref 30.0–36.0)
MCV: 83.7 fL (ref 80.0–100.0)
Platelets: 128 10*3/uL — ABNORMAL LOW (ref 150–400)
RBC: 4.97 MIL/uL (ref 4.22–5.81)
RDW: 13.2 % (ref 11.5–15.5)
WBC: 2.6 10*3/uL — ABNORMAL LOW (ref 4.0–10.5)
nRBC: 0 % (ref 0.0–0.2)

## 2021-06-29 LAB — BASIC METABOLIC PANEL
Anion gap: 8 (ref 5–15)
BUN: 19 mg/dL (ref 8–23)
CO2: 33 mmol/L — ABNORMAL HIGH (ref 22–32)
Calcium: 9.1 mg/dL (ref 8.9–10.3)
Chloride: 95 mmol/L — ABNORMAL LOW (ref 98–111)
Creatinine, Ser: 0.53 mg/dL — ABNORMAL LOW (ref 0.61–1.24)
GFR, Estimated: >60 mL/min (ref >60)
Glucose, Bld: 91 mg/dL (ref 70–99)
Potassium: 4.2 mmol/L (ref 3.5–5.1)
Sodium: 136 mmol/L (ref 135–145)

## 2021-06-29 LAB — PHOSPHORUS: Phosphorus: 4.1 mg/dL (ref 2.5–4.6)

## 2021-06-29 LAB — MAGNESIUM: Magnesium: 2.2 mg/dL (ref 1.7–2.4)

## 2021-06-29 MED ORDER — SODIUM CHLORIDE 0.9 % IV BOLUS
1000.0000 mL | Freq: Once | INTRAVENOUS | Status: AC
  Administered 2021-06-29: 1000 mL via INTRAVENOUS

## 2021-06-29 MED ORDER — SODIUM CHLORIDE 0.9% FLUSH
10.0000 mL | Freq: Two times a day (BID) | INTRAVENOUS | Status: DC
Start: 2021-06-30 — End: 2021-07-02
  Administered 2021-06-30 – 2021-07-02 (×6): 10 mL

## 2021-06-29 MED ORDER — SODIUM CHLORIDE 0.9% FLUSH: 10.0000 mL | INTRAVENOUS | Status: DC | PRN

## 2021-06-29 MED ORDER — CHLORHEXIDINE GLUCONATE CLOTH 2 % EX PADS
6.0000 | MEDICATED_PAD | Freq: Every day | CUTANEOUS | Status: DC
  Administered 2021-06-30 – 2021-07-02 (×3): 6 via TOPICAL

## 2021-06-29 MED ORDER — LACTATED RINGERS IV SOLN: INTRAVENOUS | Status: AC

## 2021-06-29 MED ORDER — OXYCODONE-ACETAMINOPHEN 5-325 MG PO TABS
1.0000 | ORAL_TABLET | ORAL | Status: DC | PRN
  Administered 2021-06-29: 1
  Filled 2021-06-29: qty 1

## 2021-06-29 MED ORDER — LIDOCAINE 5 % EX PTCH
1.0000 | MEDICATED_PATCH | CUTANEOUS | Status: AC
Start: 2021-06-29 — End: 2021-06-29
  Administered 2021-06-29: 1 via TRANSDERMAL
  Filled 2021-06-29: qty 1

## 2021-06-29 NOTE — Progress Notes (Signed)
Patient refused abd binder also not compliant with npo still complain 7/10 pain at [place] site

## 2021-06-29 NOTE — Progress Notes (Signed)
2334 - paged hospitalist Judd Gaudier back with KUB results which revealed some trapped air. New orders for simethicone received. Discussed various pain management options and agreed that morphine should be a last resort - EMLA cream and Lidocaine patch were both discussed. Lidocaine patch ordered.  0025 - Pt appeared to be asleep when entering the room, however was quite pale, grimacing and guarding and c/o 10/10 abdominal pain. BP was discovered to be 81/49. Paged hospitalists again to report this new development. New orders received for NS bolus. Simethicone was given.  0035 - pt agreed to try low int suction. A large amount of brown opaque liquid immediately was returned into the suction cannister. Pt had self removed the PEG dressing, so a new one was placed and lidocaine patch was applied to the abdomen. NS bolus hanging. Pt reports the pain has subsided to occasional sharp cramping although it does remain a 10/10. Pt is no longer grimacing or guarding the site, however. BP 91/65.

## 2021-06-29 NOTE — Progress Notes (Signed)
PT Cancellation Note  Patient Details Name: ELIHUE EBERT MRN: [id number] DOB: [birth year]   Cancelled Treatment:    Reason Eval/Treat Not Completed: Patient declined to participate with PT services this date secondary to soreness/pain related to recent G-tube placement. Will attempt to see pt at a future date/time as medically appropriate.      Linus Salmons PT, DPT 06/29/21, 11:09 AM

## 2021-06-29 NOTE — Progress Notes (Signed)
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  He was admitted 14 days ago and treated for acute on chronic hypoxic respiratory failure, postobstructive pneumonia and COPD exacerbation.  The patient's also had dysphagia and was having aspiration and it was recommended for PEG tube placement.  PEG tube placed on 10/4.   Assessment & Plan:   Principal Problem:   Acute on chronic respiratory failure with hypoxia (HCC) Active Problems:   Adenocarcinoma of lung, stage 4 (HCC)   COPD exacerbation (HCC)   SOB (shortness of breath)   Elevated troponin   Dysphagia   GAD (generalized anxiety disorder)   Moderate malnutrition (HCC)   Chronic diastolic CHF (congestive heart failure) (HCC)   Thrombocytopenia (HCC)   Hypokalemia  Dysphagia. It appears to me that patient could not clear his airway secretion.  He has a constant aspiration. I spoke with nurse, patient still drinking water and liquid despite he is ordered strict n.p.o.  This significant increase his risk for aspiration.  Based on my assessment, patient will have recurrent aspiration pneumonia.  I will obtain palliative care's consult. I also advised patient that he should strictly adhere to NPO. Patient had a PEG tube placed yesterday, x-ray showed some free air underneath the liver, this could be secondary to the procedure.  Patient has no abdominal pain, no evidence of acute abdomen.  I will start tube feeding at the lower dose as scheduled.  Repeat x-ray in the evening time.  Acute on chronic hypoxemic respite failure. COPD exacerbation. Lung cancer. Postobstructive pneumonia. Patient appears to back to baseline on 4 L oxygen.  Patient is still on prednisone taper.  Antibiotics has  completed.  Stage IV adenocarcinoma of the lung with metastasis to liver and bone. Thrombocytopenia secondary to chemo. Leukopenia secondary to chemotherapy. Followed by oncology, patient request seeing Dr. Grayland Ormond.  I will order consult.  Chronic diastolic congestive heart failure. No evidence of exacerbation.    DVT prophylaxis: SCDs Code Status: full Family Communication:  Disposition Plan:    Status is: Inpatient  Remains inpatient appropriate because:Inpatient level of care appropriate due to severity of illness  Dispo:  Patient From: Home  Planned Disposition: Home  Medically stable for discharge:            I/O last 3 completed shifts: In: 300 [I.V.:300] Out: 0  No intake/output data recorded.     Consultants:  Oncology  Procedures: PEG  Antimicrobials: None  Subjective: Patient is already strict n.p.o., but still drinking fluids. He could not clear his airway, he required frequent suctioning of his upper airway. Cough, not able to cough up mucus. Short of breath with exertion. No fever or chills. No abdominal pain or nausea vomiting. No dysuria hematuria.  Objective: Vitals:   06/29/21 0442 06/29/21 0542 06/29/21 0728 06/29/21 1201  BP: 113/77  101/61 115/74  Pulse: 99  71 96  Resp: 20  19 18   Temp: 97.8 F (36.6 C)  98.4 F (36.9 C) 98.3 F (36.8 C)  TempSrc: Oral     SpO2: 100% 99% 98% 90%  Weight:      Height:        Intake/Output Summary (Last 24 hours) at 06/29/2021 1329 Last data filed at 06/28/2021 1413  Gross per 24 hour  Intake 300 ml  Output --  Net 300 ml   Filed Weights   06/25/21 0525 06/26/21 0539 06/27/21 0405  Weight: 89 kg 90.6 kg 91.6 kg    Examination:  General exam: Appears calm and comfortable, appear malnourished Respiratory system: Diffuse wheezes. Respiratory effort normal. Cardiovascular system: S1 & S2 heard, RRR. No JVD, murmurs, rubs, gallops or clicks. No pedal edema. Gastrointestinal system:  Abdomen is nondistended, soft and nontender. No organomegaly or masses felt. Normal bowel sounds heard. Central nervous system: Alert and oriented. No focal neurological deficits. Extremities: Symmetric 5 x 5 power. Skin: No rashes, lesions or ulcers Psychiatry: Mood & affect appropriate.     Data Reviewed: I have personally reviewed following labs and imaging studies  CBC: Recent Labs  Lab 06/24/21 0555 06/27/21 0415 06/29/21 0601  WBC 4.3 3.7* 2.6*  HGB 13.8 13.2 13.9  HCT 39.2 38.0* 41.6  MCV 81.7 81.7 83.7  PLT 96* 134* 179*   Basic Metabolic Panel: Recent Labs  Lab 06/24/21 0555 06/25/21 0646 06/27/21 0415 06/29/21 0601  NA 132* 133* 133* 136  K 2.8* 3.1* 4.1 4.2  CL 88* 89* 94* 95*  CO2 36* 36* 33* 33*  GLUCOSE 100* 99 111* 91  BUN 27* 22 20 19   CREATININE 0.48* 0.50* 0.48* 0.53*  CALCIUM 8.5* 8.8* 9.1 9.1  MG 2.3  --   --  2.2  PHOS  --  2.8  --  4.1   GFR: Estimated Creatinine Clearance: 104.4 mL/min (A) (by C-G formula based on SCr of 0.53 mg/dL (L)). Liver Function Tests: No results for input(s): AST, ALT, ALKPHOS, BILITOT, PROT, ALBUMIN in the last 168 hours. No results for input(s): LIPASE, AMYLASE in the last 168 hours. No results for input(s): AMMONIA in the last 168 hours. Coagulation Profile: Recent Labs  Lab 06/27/21 0415  INR 0.9   Cardiac Enzymes: No results for input(s): CKTOTAL, CKMB, CKMBINDEX, TROPONINI in the last 168 hours. BNP (last 3 results) No results for input(s): PROBNP in the last 8760 hours. HbA1C: No results for input(s): HGBA1C in the last 72 hours. CBG: No results for input(s): GLUCAP in the last 168 hours. Lipid Profile: No results for input(s): CHOL, HDL, LDLCALC, TRIG, CHOLHDL, LDLDIRECT in the last 72 hours. Thyroid Function Tests: No results for input(s): TSH, T4TOTAL, FREET4, T3FREE, THYROIDAB in the last 72 hours. Anemia Panel: No results for input(s): VITAMINB12, FOLATE, FERRITIN, TIBC, IRON, RETICCTPCT in  the last 72 hours. Sepsis Labs: No results for input(s): PROCALCITON, LATICACIDVEN in the last 168 hours.  No results found for this or any previous visit (from the past 240 hour(s)).       Radiology Studies: DG Abd 1 View  Result Date: 06/28/2021 CLINICAL DATA:  Abdomen pain recent procedure EXAM: ABDOMEN - 1 VIEW COMPARISON:  06/28/2021, 06/23/2021 FINDINGS: Trace free air beneath the right diaphragm with small moderate volume free air beneath the left diaphragm. Patient is status post percutaneous gastrostomy. There is a nonobstructed gas pattern. IMPRESSION: Small moderate free air within the abdomen, potentially related to recent percutaneous gastrostomy tube placement. Consider serial radiographic follow-up to ensure resolution/decreasing free air. These results will be called to the ordering clinician or representative by the Radiologist Assistant, and communication documented in the PACS or [employer]. Electronically Signed   By: Donavan Foil M.D.   On: 06/28/2021 22:51   IR GASTROSTOMY TUBE MOD SED  Result Date: 06/28/2021 INDICATION: Cancer, dysphagia with malnutrition EXAM: Placement of  percutaneous gastrostomy tube using fluoroscopic guidance MEDICATIONS: Glucagon 1 mg IV ANESTHESIA/SEDATION: Sedation provided by the Department of Anesthesiology. Refer to separate anesthesia records. CONTRAST:  20 mL-administered into the gastric lumen. FLUOROSCOPY TIME:  Fluoroscopy Time: 2 minutes 30 seconds (35 mGy). COMPLICATIONS: None immediate. PROCEDURE: Informed written consent was obtained from the patient after a thorough discussion of the procedural risks, benefits and alternatives. All questions were addressed. Maximal Sterile Barrier Technique was utilized including caps, mask, sterile gowns, sterile gloves, sterile drape, hand hygiene and skin antiseptic. A timeout was performed prior to the initiation of the procedure. Review of pre-procedure CT scan demonstrates an adequate window  for percutaneous placement of a gastrostomy tube. The patient was placed supine on the exam table. An angled 5 French catheter was passed through the nares into the stomach by the anesthesia team. It was secured to the nose, and the stomach was insufflated with air. The abdomen was prepped and draped in the standard sterile fashion. After insufflating the stomach with air, puncture sites were selected and local analgesia was obtained with 1% lidocaine. Using fluoroscopic guidance, a gastropexy needle was advanced into the stomach and the T-bar suture was released. Entry into the stomach was confirmed with fluoroscopy, aspiration of air, and injection of contrast material. This was repeated with an additional gastropexy suture (for a total of 3 fasteners). At the center of these gastropexy sutures, a dermatotomy was performed. An 18 gauge needle was then passed into the stomach, and position within the gastric lumen again confirmed under fluoroscopy using aspiration of air and injection of contrast material. An Amplatz guidewire was passed through this needle and intraluminal placement was confirmed with fluoroscopy. The needle was removed, and over the guidewire, a 20 French balloon gastrostomy tube with a coaxial 10 mm balloon was advanced into the percutaneous tract. Dilation of the percutaneous tract was then performed using the balloon, followed by advancement of the gastrostomy tube into the gastric lumen. The retention balloon was then inflated with 19 mL of sterile water, and the tube was brought back to the gastric wall. The wire and balloon were removed. The external bumper was brought to the skin. Location of the gastrostomy tube within the stomach was then confirmed with injection of contrast material, opacifying the gastric lumen. The gastrostomy tube was flushed with sterile water, and secured to the skin using a dressing. The patient tolerated the procedure well without immediate complication, and was  transferred to recovery in stable condition. IMPRESSION: Successful placement of a 20 French percutaneous gastrostomy tube using fluoroscopic guidance. Electronically Signed   By: Albin Felling M.D.   On: 06/28/2021 14:42   [place] OR NERVE BLOCK-IMAGE ONLY [facility])  Result Date: 06/28/2021 There is no interpretation for this exam.  This order is for images obtained during a surgical procedure.  Please See "Surgeries" Tab for more information regarding the procedure.        Scheduled Meds:  budesonide (PULMICORT) nebulizer solution  0.5 mg Nebulization BID   docusate sodium  200 mg Oral BID   fentaNYL (SUBLIMAZE) injection  50 mcg Intravenous Once   fluticasone  1 spray Each Nare Daily   furosemide  40 mg Oral Daily   guaiFENesin  600 mg Oral BID   ipratropium-albuterol  3 mL Nebulization Q6H   lactulose  20 g Oral BID   loratadine  10 mg Oral Daily   midazolam  1 mg Intravenous Once   pantoprazole (PROTONIX) IV  40 mg Intravenous Daily  potassium chloride  20 mEq Oral BID   predniSONE  30 mg Oral Q breakfast   Followed by   Derrill Memo ON 07/02/2021] predniSONE  20 mg Oral Q breakfast   Followed by   Derrill Memo ON 07/06/2021] predniSONE  10 mg Oral Q breakfast   Followed by   Derrill Memo ON 07/10/2021] predniSONE  5 mg Oral Q breakfast   Continuous Infusions:   LOS: 16 days    Time spent: 32 minutes    Sharen Hones, MD [facility]   To contact the attending provider between 7A-7P or the covering provider during after hours 7P-7A, please log into the web site [email or web address] and access using universal [place] password for that web site. If you do not have the password, please call the hospital operator.  06/29/2021, 1:29 PM

## 2021-06-29 NOTE — Progress Notes (Signed)
Paged on call hospitalist rt pt c/o unrelieved pain at new PEG site despite receiving 2mg  morphine x2, oxycodone, and tylenol since arriving back from surgery.  Pt also continues to refuse to be connected to INT suction. Pt was educated on the purpose of suction in recovery from this procedure but states "I can't handle it - it's going to hurt too much." VSS, abdomen soft to touch with active/hypoactive sounds. Dressing for PEG is c/d/I, no fluids or blood noted on or around site. Pt also refusing night time medications due to "I don't think I can handle anything on my stomach right now." See new orders.

## 2021-06-29 NOTE — Progress Notes (Signed)
Notified night on call Sharion Settler of discussion with pt - pt says he will not take lactulose tonight but would be willing to try mirilax instead. Per report, was told that the pt TF initiation was delayed due to gas/stool in the abdomen that the Dr wanted the pt to pass prior to beginning feeds.  Also discussed this with dietician, who messaged me requesting an update around 2000. Relayed this to on call to notify of pts continued refusal of laxatives in light of need for BM to begin feeding and requested mirilax.  Was told "up to day team".  Also noted that the pt was charted by NT to have had a BM around [place] prior to shift change, however the pt denies having had a BM since several days before the PEG was placed.  Before today's BM was charted, last BM was charted in mid September.

## 2021-06-30 ENCOUNTER — Inpatient Hospital Stay: Payer: Self-pay | Admitting: Oncology

## 2021-06-30 ENCOUNTER — Inpatient Hospital Stay: Payer: Self-pay

## 2021-06-30 ENCOUNTER — Inpatient Hospital Stay: Payer: Self-pay | Admitting: Pharmacist

## 2021-06-30 LAB — CBC WITH DIFFERENTIAL/PLATELET
Abs Immature Granulocytes: 0.04 10*3/uL (ref 0.00–0.07)
Basophils Absolute: 0 10*3/uL (ref 0.0–0.1)
Basophils Relative: 0 %
Eosinophils Absolute: 0 10*3/uL (ref 0.0–0.5)
Eosinophils Relative: 0 %
HCT: 37.4 % — ABNORMAL LOW (ref 39.0–52.0)
Hemoglobin: 12.8 g/dL — ABNORMAL LOW (ref 13.0–17.0)
Immature Granulocytes: 1 %
Lymphocytes Relative: 21 %
Lymphs Abs: 0.6 10*3/uL — ABNORMAL LOW (ref 0.7–4.0)
MCH: 28.9 pg (ref 26.0–34.0)
MCHC: 34.2 g/dL (ref 30.0–36.0)
MCV: 84.4 fL (ref 80.0–100.0)
Monocytes Absolute: 0.6 10*3/uL (ref 0.1–1.0)
Monocytes Relative: 19 %
Neutro Abs: 1.7 10*3/uL (ref 1.7–7.7)
Neutrophils Relative %: 59 %
Platelets: 129 10*3/uL — ABNORMAL LOW (ref 150–400)
RBC: 4.43 MIL/uL (ref 4.22–5.81)
RDW: 13.2 % (ref 11.5–15.5)
WBC: 2.9 10*3/uL — ABNORMAL LOW (ref 4.0–10.5)
nRBC: 0 % (ref 0.0–0.2)

## 2021-06-30 LAB — BASIC METABOLIC PANEL
Anion gap: 7 (ref 5–15)
BUN: 17 mg/dL (ref 8–23)
CO2: 33 mmol/L — ABNORMAL HIGH (ref 22–32)
Calcium: 8.9 mg/dL (ref 8.9–10.3)
Chloride: 95 mmol/L — ABNORMAL LOW (ref 98–111)
Creatinine, Ser: 0.56 mg/dL — ABNORMAL LOW (ref 0.61–1.24)
GFR, Estimated: >60 mL/min (ref >60)
Glucose, Bld: 87 mg/dL (ref 70–99)
Potassium: 4.2 mmol/L (ref 3.5–5.1)
Sodium: 135 mmol/L (ref 135–145)

## 2021-06-30 LAB — MAGNESIUM: Magnesium: 2.2 mg/dL (ref 1.7–2.4)

## 2021-06-30 MED ORDER — FREE WATER
120.0000 mL | Freq: Every day | Status: DC
  Administered 2021-06-30 – 2021-07-02 (×14): 120 mL

## 2021-06-30 MED ORDER — OSMOLITE 1.5 CAL PO LIQD
120.0000 mL | Freq: Every day | ORAL | Status: AC
  Administered 2021-06-30 (×5): 120 mL

## 2021-06-30 MED ORDER — POLYETHYLENE GLYCOL 3350 17 G PO PACK
17.0000 g | PACK | Freq: Every day | ORAL | Status: DC
  Administered 2021-06-30: 17 g
  Filled 2021-06-30 (×2): qty 1

## 2021-06-30 MED ORDER — OSMOLITE 1.5 CAL PO LIQD
237.0000 mL | Freq: Every day | ORAL | Status: DC
  Administered 2021-07-01 – 2021-07-02 (×9): 237 mL

## 2021-06-30 MED ORDER — PROSOURCE TF PO LIQD
45.0000 mL | Freq: Three times a day (TID) | ORAL | Status: DC
  Administered 2021-06-30 – 2021-07-02 (×5): 45 mL
  Filled 2021-06-30 (×7): qty 45

## 2021-06-30 NOTE — Progress Notes (Signed)
PT Cancellation Note  Patient Details Name: Derek Clarke MRN: [id number] DOB: October 08, 1955   Cancelled Treatment:    Reason Eval/Treat Not Completed: Other (comment).  Pt sitting up in recliner upon PT arrival.  Pt declining PT session d/t not feeling up to it since PEG tube placement.  Therapist discussed importance of mobility and strengthening but pt still declining therapy participation.  Will re-attempt PT session at a later date/time.  Leitha Bleak, PT 06/30/21, 1:10 PM

## 2021-06-30 NOTE — Plan of Care (Signed)
  Problem: Clinical Measurements: Goal: Ability to maintain clinical measurements within normal limits will improve Outcome: Progressing   Problem: Clinical Measurements: Goal: Diagnostic test results will improve Outcome: Progressing   Problem: Clinical Measurements: Goal: Respiratory complications will improve Outcome: Progressing   Problem: Clinical Measurements: Goal: Cardiovascular complication will be avoided Outcome: Progressing

## 2021-06-30 NOTE — TOC Progression Note (Signed)
Transition of Care [facility]) - Progression Note    Patient Details  Name: Derek Clarke MRN: [id number] Date of Birth: [birth year]  Transition of Care [facility]) CM/SW Contact  Eileen Stanford, LCSW Phone Number: 06/30/2021, 2:39 PM  Clinical Narrative:   Per RN in progression rounds pt is independently ambulatory. CSW reached out to [facility] and per Thedore Mins they can provide pt with tube feeds if the hospital can send the pt stocked with 3 days worth of supplies and formula in order for DME agency to be able to have time to get supplies to pt's house. CSW messaged Dietary and MD. Pt tube feeds started today. Will see if pt can tolerate formula.     Expected Discharge Plan: Home/Self Care Barriers to Discharge: Continued Medical Work up  Expected Discharge Plan and Services Expected Discharge Plan: Home/Self Care       Living arrangements for the past 2 months: Single Family Home                                       Social Determinants of Health (SDOH) Interventions    Readmission Risk Interventions No flowsheet data found.

## 2021-06-30 NOTE — TOC Progression Note (Addendum)
Transition of Care [facility]) - Progression Note    Patient Details  Name: Derek Clarke MRN: [id number] Date of Birth: [birth year]  Transition [facility] Care [facility]) CM/SW Contact  Eileen Stanford, LCSW Phone Number: 06/30/2021, 9:10 AM  Clinical Narrative: SNF's will take PEG tubes however, pt has no insurance. This will be the barrier to placement. CSW will reach out to SNF's to see if anyone will take a LOG.    CSW has sent referral [facility]- will not take LOG [facility]- will not take LOG    Expected Discharge Plan: Home/Self Care Barriers to Discharge: Continued Medical Work up  Expected Discharge Plan and Services Expected Discharge Plan: Home/Self Care       Living arrangements for the past 2 months: Single Family Home                                       Social Determinants of Health (SDOH) Interventions    Readmission Risk Interventions No flowsheet data found.

## 2021-06-30 NOTE — Progress Notes (Signed)
PROGRESS NOTE    Derek Clarke  PTW:[id number] DOB: April 29, 1956 DOA: 06/13/2021 PCP: Pcp, No   Chief complaint.  Shortness of breath. 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  He was admitted 14 days ago and treated for acute on chronic hypoxic respiratory failure, postobstructive pneumonia and COPD exacerbation.  The patient's also had dysphagia and was having aspiration and it was recommended for PEG tube placement.  PEG tube placed on 10/4.   Assessment & Plan:   Principal Problem:   Acute on chronic respiratory failure with hypoxia (HCC) Active Problems:   Adenocarcinoma of lung, stage 4 (HCC)   COPD exacerbation (HCC)   SOB (shortness of breath)   Elevated troponin   Dysphagia   GAD (generalized anxiety disorder)   Moderate malnutrition (HCC)   Chronic diastolic CHF (congestive heart failure) (HCC)   Thrombocytopenia (HCC)   Hypokalemia   Hyponatremia  Dysphagia. Patient has G-tube placed yesterday, x-ray showed free air.  Discussed with radiology, there is no concern for small amount of free air.  We will monitor that over time. I will start tube feeding today.  Recheck BMP mag and Phos level tomorrow. I discussed with patient again that he needed strict adherence to n.p.o. status to avoid aspiration.  Acute on chronic hypoxemic respite failure. COPD exacerbation. Lung cancer. Postobstructive pneumonia. Continue steroid taper, oxygenation appears to baseline.   Stage IV adenocarcinoma of the lung with metastasis to liver and bone. Thrombocytopenia secondary to chemo. Leukopenia secondary to chemotherapy. Will be followed by oncology   Chronic diastolic congestive heart failure  Still no exacerbation.  Constipation. Patient only agreeable to take MiraLAX.    DVT prophylaxis: SCDs Code Status: full Family  Communication:  Disposition Plan:    Status is: Inpatient  Remains inpatient appropriate because:Unsafe d/c plan  Dispo:  Patient From: Home  Planned Disposition: SNF  Medically stable for discharge:            I/O last 3 completed shifts: In: -  Out: 2600 [Urine:2600] No intake/output data recorded.     Consultants:  None  Procedures: PEG  Antimicrobials: None  Subjective: Patient feels better today, less wheezing.  Short of breath seem to be better.  He is on 4 L oxygen which is baseline. He is constipated, refused lactulose, agreeable to take MiraLAX. No nausea vomiting or abdominal pain No dysuria hematuria pain No fever or chills.  Objective: Vitals:   06/29/21 2352 06/30/21 0415 06/30/21 0415 06/30/21 0825  BP: (!) 128/91 117/79 117/79 116/79  Pulse: 100 97 96 91  Resp: 20 20 20 18   Temp: 98.1 F (36.7 C) 98.3 F (36.8 C) 98.3 F (36.8 C)   TempSrc:      SpO2: 98% 99% 99% 99%  Weight:  91 kg    Height:        Intake/Output Summary (Last 24 hours) at 06/30/2021 0953 Last data filed at 06/30/2021 0522 Gross per 24 hour  Intake --  Output 2600 ml  Net -2600 ml   Filed Weights   06/26/21 0539 06/27/21 0405 06/30/21 0415  Weight: 90.6 kg 91.6 kg 91 kg    Examination:  General exam: Appears calm and comfortable  Respiratory system: Decreased breathing sounds without crackles or wheezes. Respiratory effort normal. Cardiovascular system: S1 & S2 heard, RRR. No JVD, murmurs, rubs, gallops or clicks.  No pedal edema. Gastrointestinal system: Abdomen is nondistended, soft and nontender. No organomegaly or masses felt. Normal bowel sounds heard. Central nervous system: Alert and oriented x3. No focal neurological deficits. Extremities: Symmetric 5 x 5 power. Skin: No rashes, lesions or ulcers Psychiatry: Judgement and insight appear normal. Mood & affect appropriate.     Data Reviewed: I have personally reviewed following labs and imaging  studies  CBC: Recent Labs  Lab 06/24/21 0555 06/27/21 0415 06/29/21 0601 06/30/21 0540  WBC 4.3 3.7* 2.6* 2.9*  NEUTROABS  --   --   --  1.7  HGB 13.8 13.2 13.9 12.8*  HCT 39.2 38.0* 41.6 37.4*  MCV 81.7 81.7 83.7 84.4  PLT 96* 134* 128* 829*   Basic Metabolic Panel: Recent Labs  Lab 06/24/21 0555 06/25/21 0646 06/27/21 0415 06/29/21 0601 06/30/21 0540  NA 132* 133* 133* 136 135  K 2.8* 3.1* 4.1 4.2 4.2  CL 88* 89* 94* 95* 95*  CO2 36* 36* 33* 33* 33*  GLUCOSE 100* 99 111* 91 87  BUN 27* 22 20 19 17   CREATININE 0.48* 0.50* 0.48* 0.53* 0.56*  CALCIUM 8.5* 8.8* 9.1 9.1 8.9  MG 2.3  --   --  2.2 2.2  PHOS  --  2.8  --  4.1  --    GFR: Estimated Creatinine Clearance: 104 mL/min (A) (by C-G formula based on SCr of 0.56 mg/dL (L)). Liver Function Tests: No results for input(s): AST, ALT, ALKPHOS, BILITOT, PROT, ALBUMIN in the last 168 hours. No results for input(s): LIPASE, AMYLASE in the last 168 hours. No results for input(s): AMMONIA in the last 168 hours. Coagulation Profile: Recent Labs  Lab 06/27/21 0415  INR 0.9   Cardiac Enzymes: No results for input(s): CKTOTAL, CKMB, CKMBINDEX, TROPONINI in the last 168 hours. BNP (last 3 results) No results for input(s): PROBNP in the last 8760 hours. HbA1C: No results for input(s): HGBA1C in the last 72 hours. CBG: No results for input(s): GLUCAP in the last 168 hours. Lipid Profile: No results for input(s): CHOL, HDL, LDLCALC, TRIG, CHOLHDL, LDLDIRECT in the last 72 hours. Thyroid Function Tests: No results for input(s): TSH, T4TOTAL, FREET4, T3FREE, THYROIDAB in the last 72 hours. Anemia Panel: No results for input(s): VITAMINB12, FOLATE, FERRITIN, TIBC, IRON, RETICCTPCT in the last 72 hours. Sepsis Labs: No results for input(s): PROCALCITON, LATICACIDVEN in the last 168 hours.  No results found for this or any previous visit (from the past 240 hour(s)).       Radiology Studies: DG Abd 1 View  Result  Date: 06/29/2021 CLINICAL DATA:  Gastrostomy tube complication EXAM: ABDOMEN - 1 VIEW COMPARISON:  06/28/2021 FINDINGS: Lung bases clear. Free air again identified under the LEFT hemidiaphragm. Gastrostomy tube LEFT upper quadrant. Stool in colon with small amount of contrast in small bowel loop mid abdomens in LEFT abdomen. Few scattered sclerotic foci in the pelvis and proximal LEFT femur, patient with known osseous metastases. IMPRESSION: Persistent free intraperitoneal air under the LEFT hemidiaphragm. Electronically Signed   By: Lavonia Dana M.D.   On: 06/29/2021 17:52   DG Abd 1 View  Result Date: 06/28/2021 CLINICAL DATA:  Abdomen pain recent procedure EXAM: ABDOMEN - 1 VIEW COMPARISON:  06/28/2021, 06/23/2021 FINDINGS: Trace free air beneath the right diaphragm with small moderate volume free air beneath the left diaphragm. Patient is status post percutaneous gastrostomy. There is a nonobstructed gas pattern. IMPRESSION: Small moderate free air within the abdomen, potentially related to recent percutaneous gastrostomy tube  placement. Consider serial radiographic follow-up to ensure resolution/decreasing free air. These results will be called to the ordering clinician or representative by the Radiologist Assistant, and communication documented in the PACS or [employer]. Electronically Signed   By: Donavan Foil M.D.   On: 06/28/2021 22:51   IR GASTROSTOMY TUBE MOD SED  Result Date: 06/28/2021 INDICATION: Cancer, dysphagia with malnutrition EXAM: Placement of percutaneous gastrostomy tube using fluoroscopic guidance MEDICATIONS: Glucagon 1 mg IV ANESTHESIA/SEDATION: Sedation provided by the Department of Anesthesiology. Refer to separate anesthesia records. CONTRAST:  20 mL-administered into the gastric lumen. FLUOROSCOPY TIME:  Fluoroscopy Time: 2 minutes 30 seconds (35 mGy). COMPLICATIONS: None immediate. PROCEDURE: Informed written consent was obtained from the patient after a thorough  discussion of the procedural risks, benefits and alternatives. All questions were addressed. Maximal Sterile Barrier Technique was utilized including caps, mask, sterile gowns, sterile gloves, sterile drape, hand hygiene and skin antiseptic. A timeout was performed prior to the initiation of the procedure. Review of pre-procedure CT scan demonstrates an adequate window for percutaneous placement of a gastrostomy tube. The patient was placed supine on the exam table. An angled 5 French catheter was passed through the nares into the stomach by the anesthesia team. It was secured to the nose, and the stomach was insufflated with air. The abdomen was prepped and draped in the standard sterile fashion. After insufflating the stomach with air, puncture sites were selected and local analgesia was obtained with 1% lidocaine. Using fluoroscopic guidance, a gastropexy needle was advanced into the stomach and the T-bar suture was released. Entry into the stomach was confirmed with fluoroscopy, aspiration of air, and injection of contrast material. This was repeated with an additional gastropexy suture (for a total of 3 fasteners). At the center of these gastropexy sutures, a dermatotomy was performed. An 18 gauge needle was then passed into the stomach, and position within the gastric lumen again confirmed under fluoroscopy using aspiration of air and injection of contrast material. An Amplatz guidewire was passed through this needle and intraluminal placement was confirmed with fluoroscopy. The needle was removed, and over the guidewire, a 20 French balloon gastrostomy tube with a coaxial 10 mm balloon was advanced into the percutaneous tract. Dilation of the percutaneous tract was then performed using the balloon, followed by advancement of the gastrostomy tube into the gastric lumen. The retention balloon was then inflated with 19 mL of sterile water, and the tube was brought back to the gastric wall. The wire and balloon  were removed. The external bumper was brought to the skin. Location of the gastrostomy tube within the stomach was then confirmed with injection of contrast material, opacifying the gastric lumen. The gastrostomy tube was flushed with sterile water, and secured to the skin using a dressing. The patient tolerated the procedure well without immediate complication, and was transferred to recovery in stable condition. IMPRESSION: Successful placement of a 20 French percutaneous gastrostomy tube using fluoroscopic guidance. Electronically Signed   By: Albin Felling M.D.   On: 06/28/2021 14:42   [place] OR NERVE BLOCK-IMAGE ONLY [facility])  Result Date: 06/28/2021 There is no interpretation for this exam.  This order is for images obtained during a surgical procedure.  Please See "Surgeries" Tab for more information regarding the procedure.        Scheduled Meds:  budesonide (PULMICORT) nebulizer solution  0.5 mg Nebulization BID   Chlorhexidine Gluconate Cloth  6 each Topical Daily   docusate sodium  200 mg Oral BID  feeding supplement (OSMOLITE 1.5 CAL)  120 mL Per Tube 6 X Daily   [START ON 07/01/2021] feeding supplement (OSMOLITE 1.5 CAL)  237 mL Per Tube 6 X Daily   feeding supplement (PROSource TF)  45 mL Per Tube TID   fentaNYL (SUBLIMAZE) injection  50 mcg Intravenous Once   fluticasone  1 spray Each Nare Daily   free water  120 mL Per Tube 6 X Daily   furosemide  40 mg Oral Daily   guaiFENesin  600 mg Oral BID   ipratropium-albuterol  3 mL Nebulization Q6H   lactulose  20 g Oral BID   loratadine  10 mg Oral Daily   midazolam  1 mg Intravenous Once   pantoprazole (PROTONIX) IV  40 mg Intravenous Daily   potassium chloride  20 mEq Oral BID   predniSONE  30 mg Oral Q breakfast   Followed by   Derrill Memo ON 07/02/2021] predniSONE  20 mg Oral Q breakfast   Followed by   Derrill Memo ON 07/06/2021] predniSONE  10 mg Oral Q breakfast   Followed by   Derrill Memo ON 07/10/2021] predniSONE  5 mg Oral Q breakfast    sodium chloride flush  10-40 mL Intracatheter Q12H   Continuous Infusions:   LOS: 17 days    Time spent: 27 minutes    Sharen Hones, MD [facility]   To contact the attending provider between 7A-7P or the covering provider during after hours 7P-7A, please log into the web site [email or web address] and access using universal [place] password for that web site. If you do not have the password, please call the hospital operator.  06/30/2021, 9:53 AM

## 2021-06-30 NOTE — NC FL2 (Signed)
[place] LEVEL OF CARE SCREENING TOOL     IDENTIFICATION  Patient Name: Derek Clarke Birthdate: July 09, 1956 Sex: male Admission Date (Current Location): 06/13/2021  [facility] and [place] Number:  Engineering geologist and Address:  [facility], [address], [place], [place] [address]      Provider Number: [id number]  Attending Physician Name and Address:  Sharen Hones, MD  Relative Name and Phone Number:       Current Level of Care: Hospital Recommended Level of Care: [place] Prior Approval Number:    Date Approved/Denied:   PASRR Number: [id number]  Discharge Plan: SNF    Current Diagnoses: Patient Active Problem List   Diagnosis Date Noted   Hyponatremia 06/29/2021   Hypokalemia    Chronic diastolic CHF (congestive heart failure) (HCC)    Thrombocytopenia (HCC)    Moderate malnutrition ([place]) 06/22/2021   SOB (shortness of breath) 06/13/2021   Elevated troponin 06/13/2021   Dysphagia 06/13/2021   GAD (generalized anxiety disorder) 06/13/2021   Palliative care encounter    Acute on chronic respiratory failure with hypoxia ([place]) 06/07/2021   Chronic respiratory failure with hypoxia ([place]) 06/07/2021   COPD (chronic obstructive pulmonary disease) (HCC)    Severe sepsis (HCC)    Anxiety    COPD exacerbation ([place])    Adenocarcinoma of left lung ([place]) 05/26/2021   Hemoptysis 05/15/2021   Adenocarcinoma of lung, stage 4 ([place]) 05/15/2021   Acute respiratory failure with hypoxia ([place]) 05/15/2021   Nicotine dependence 05/15/2021    Orientation RESPIRATION BLADDER Height & Weight     Self, Time, Situation, Place  O2 ([place] 4L) Continent Weight: 200 lb 9.6 oz (91 kg) Height:  5\' 9"  (175.3 cm)  BEHAVIORAL SYMPTOMS/MOOD NEUROLOGICAL BOWEL NUTRITION STATUS      Continent Feeding tube (PEG tube)  AMBULATORY STATUS COMMUNICATION OF NEEDS Skin   Extensive Assist Verbally Normal                       Personal  Care Assistance Level of Assistance  Dressing, Feeding, Bathing Bathing Assistance: Maximum assistance Feeding assistance: Maximum assistance Dressing Assistance: Maximum assistance     Functional Limitations Info  Sight, Hearing, Speech Sight Info: Adequate Hearing Info: Adequate Speech Info: Adequate    SPECIAL CARE FACTORS FREQUENCY  PT (By licensed PT), OT (By licensed OT)     PT Frequency: 5x OT Frequency: 5x            Contractures Contractures Info: Not present    Additional Factors Info  Code Status, Allergies Code Status Info: full code Allergies Info: no known allergies           Current Medications (06/30/2021):  This is the current hospital active medication list Current Facility-Administered Medications  Medication Dose Route Frequency Provider Last Rate Last Admin   acetaminophen (TYLENOL) tablet 650 mg  650 mg Oral Q6H PRN Howerter, Justin B, DO   650 mg at 06/28/21 1647   Or   acetaminophen (TYLENOL) suppository 650 mg  650 mg Rectal Q6H PRN Howerter, Justin B, DO       albuterol (PROVENTIL) (2.5 MG/3ML) 0.083% nebulizer solution 2.5 mg  2.5 mg Nebulization Q4H PRN Howerter, Justin B, DO   2.5 mg at 06/29/21 0541   ALPRAZolam Duanne Moron) tablet 0.5 mg  0.5 mg Oral QID PRN Loletha Grayer, MD   0.5 mg at 06/29/21 2216   alum & mag hydroxide-simeth (MAALOX/MYLANTA) 200-200-20 MG/5ML suspension  30 mL  30 mL Oral Q4H PRN Mansy, Jan A, MD   30 mL at 06/20/21 0106   budesonide (PULMICORT) nebulizer solution 0.5 mg  0.5 mg Nebulization BID Flora Lipps, MD   0.5 mg at 06/30/21 0820   Chlorhexidine Gluconate Cloth 2 % PADS 6 each  6 each Topical Daily Sharen Hones, MD       diphenhydrAMINE (BENADRYL) capsule 25 mg  25 mg Oral Q6H PRN Bonnielee Haff, MD   25 mg at 06/27/21 2307   docusate sodium (COLACE) capsule 200 mg  200 mg Oral BID Wyvonnia Dusky, MD   200 mg at 06/29/21 2231   fentaNYL (SUBLIMAZE) injection 50 mcg  50 mcg Intravenous Once [place] Ouch, MD       fluticasone [employer]) 50 MCG/ACT nasal spray 1 spray  1 spray Each Nare Daily Loletha Grayer, MD   1 spray at 06/29/21 0815   furosemide (LASIX) tablet 40 mg  40 mg Oral Daily Loletha Grayer, MD   40 mg at 06/29/21 1159   guaiFENesin (MUCINEX) 12 hr tablet 600 mg  600 mg Oral BID Bonnielee Haff, MD   600 mg at 06/29/21 2216   iohexol (OMNIPAQUE) 350 MG/ML injection 25 mL  25 mL Per Tube Once PRN El-Abd, Joesph Fillers, MD       ipratropium-albuterol (DUONEB) 0.5-2.5 (3) MG/3ML nebulizer solution 3 mL  3 mL Nebulization Q6H Wieting, Richard, MD   3 mL at 06/30/21 0820   lactulose ([place]) 10 GM/15ML solution 20 g  20 g Oral BID Wyvonnia Dusky, MD   20 g at 06/20/21 2213   loratadine (CLARITIN) tablet 10 mg  10 mg Oral Daily Loletha Grayer, MD   10 mg at 06/29/21 1202   midazolam (VERSED) injection 1 mg  1 mg Intravenous Once [place] Ouch, MD       ondansetron [facility]) injection 4 mg  4 mg Intravenous Q6H PRN Howerter, Justin B, DO   4 mg at 06/19/21 2321   oxyCODONE (Oxy IR/ROXICODONE) immediate release tablet 5 mg  5 mg Oral Q4H PRN Loletha Grayer, MD   5 mg at 06/29/21 1200   oxyCODONE-acetaminophen (PERCOCET/ROXICET) 5-325 MG per tablet 1 tablet  1 tablet Per Tube Q4H PRN Sharen Hones, MD   1 tablet at 06/29/21 2216   pantoprazole (PROTONIX) injection 40 mg  40 mg Intravenous Daily Wyvonnia Dusky, MD   40 mg at 06/29/21 1159   potassium chloride (KLOR-CON) packet 20 mEq  20 mEq Oral BID Loletha Grayer, MD   20 mEq at 06/29/21 2215   predniSONE (DELTASONE) tablet 30 mg  30 mg Oral Q breakfast Kathie Dike, MD   30 mg at 06/29/21 1202   Followed by   Derrill Memo ON 07/02/2021] predniSONE (DELTASONE) tablet 20 mg  20 mg Oral Q breakfast Kathie Dike, MD       Followed by   Derrill Memo ON 07/06/2021] predniSONE (DELTASONE) tablet 10 mg  10 mg Oral Q breakfast Kathie Dike, MD       Followed by   Derrill Memo ON 07/10/2021] predniSONE (DELTASONE) tablet 5 mg  5  mg Oral Q breakfast Memon, Jehanzeb, MD       sodium chloride flush (NS) 0.9 % injection 10-40 mL  10-40 mL Intracatheter Q12H Sharen Hones, MD   10 mL at 06/30/21 0229   sodium chloride flush (NS) 0.9 % injection 10-40 mL  10-40 mL Intracatheter PRN Sharen Hones, MD  Discharge Medications: Please see discharge summary for a list of discharge medications.  Relevant Imaging Results:  Relevant Lab Results:   Additional Information SSN:[id number]  Eileen Stanford, LCSW

## 2021-07-01 DIAGNOSIS — Z7189 Other specified counseling: Secondary | ICD-10-CM

## 2021-07-01 LAB — CBC WITH DIFFERENTIAL/PLATELET
Abs Immature Granulocytes: 0.06 10*3/uL (ref 0.00–0.07)
Basophils Absolute: 0 10*3/uL (ref 0.0–0.1)
Basophils Relative: 0 %
Eosinophils Absolute: 0 10*3/uL (ref 0.0–0.5)
Eosinophils Relative: 0 %
HCT: 39.2 % (ref 39.0–52.0)
Hemoglobin: 13.1 g/dL (ref 13.0–17.0)
Immature Granulocytes: 1 %
Lymphocytes Relative: 20 %
Lymphs Abs: 0.9 10*3/uL (ref 0.7–4.0)
MCH: 27.8 pg (ref 26.0–34.0)
MCHC: 33.4 g/dL (ref 30.0–36.0)
MCV: 83.1 fL (ref 80.0–100.0)
Monocytes Absolute: 0.6 10*3/uL (ref 0.1–1.0)
Monocytes Relative: 14 %
Neutro Abs: 2.9 10*3/uL (ref 1.7–7.7)
Neutrophils Relative %: 65 %
Platelets: 144 10*3/uL — ABNORMAL LOW (ref 150–400)
RBC: 4.72 MIL/uL (ref 4.22–5.81)
RDW: 13.2 % (ref 11.5–15.5)
WBC: 4.6 10*3/uL (ref 4.0–10.5)
nRBC: 0 % (ref 0.0–0.2)

## 2021-07-01 LAB — BASIC METABOLIC PANEL
Anion gap: 8 (ref 5–15)
BUN: 21 mg/dL (ref 8–23)
CO2: 33 mmol/L — ABNORMAL HIGH (ref 22–32)
Calcium: 9.3 mg/dL (ref 8.9–10.3)
Chloride: 93 mmol/L — ABNORMAL LOW (ref 98–111)
Creatinine, Ser: 0.51 mg/dL — ABNORMAL LOW (ref 0.61–1.24)
GFR, Estimated: >60 mL/min (ref >60)
Glucose, Bld: 107 mg/dL — ABNORMAL HIGH (ref 70–99)
Potassium: 3.8 mmol/L (ref 3.5–5.1)
Sodium: 134 mmol/L — ABNORMAL LOW (ref 135–145)

## 2021-07-01 LAB — MAGNESIUM: Magnesium: 2.1 mg/dL (ref 1.7–2.4)

## 2021-07-01 LAB — PHOSPHORUS: Phosphorus: 3.5 mg/dL (ref 2.5–4.6)

## 2021-07-01 MED ORDER — FUROSEMIDE 40 MG PO TABS: 40.0000 mg | ORAL_TABLET | Freq: Every day | ORAL | 0 refills | Status: AC

## 2021-07-01 MED ORDER — LACTULOSE 10 GM/15ML PO SOLN: 20.0000 g | Freq: Two times a day (BID) | ORAL | 0 refills | Status: DC | PRN

## 2021-07-01 MED ORDER — ALPRAZOLAM 0.5 MG PO TABS: 0.5000 mg | ORAL_TABLET | Freq: Two times a day (BID) | ORAL | 0 refills | Status: DC | PRN

## 2021-07-01 MED ORDER — GUAIFENESIN 100 MG/5ML PO SOLN: 10.0000 mL | ORAL | 0 refills | Status: AC | PRN

## 2021-07-01 MED ORDER — PROSOURCE TF PO LIQD: 45.0000 mL | Freq: Three times a day (TID) | ORAL | Status: AC

## 2021-07-01 MED ORDER — PREDNISONE 20 MG PO TABS: ORAL_TABLET | ORAL | 0 refills | Status: AC

## 2021-07-01 MED ORDER — DOCUSATE SODIUM 50 MG/5ML PO LIQD
200.0000 mg | Freq: Two times a day (BID) | ORAL | Status: DC
  Administered 2021-07-01 – 2021-07-02 (×2): 200 mg
  Filled 2021-07-01 (×4): qty 20

## 2021-07-01 MED ORDER — POLYETHYLENE GLYCOL 3350 17 G PO PACK: 17.0000 g | PACK | Freq: Every day | ORAL | 0 refills | Status: DC

## 2021-07-01 MED ORDER — ADULT MULTIVITAMIN W/MINERALS CH: 1.0000 | ORAL_TABLET | Freq: Every day | ORAL | 0 refills | Status: AC

## 2021-07-01 MED ORDER — OXYCODONE HCL 5 MG PO TABS: 5.0000 mg | ORAL_TABLET | ORAL | 0 refills | Status: DC | PRN

## 2021-07-01 MED ORDER — OSMOLITE 1.5 CAL PO LIQD: 237.0000 mL | Freq: Every day | ORAL | 0 refills | Status: AC

## 2021-07-01 MED ORDER — POTASSIUM CHLORIDE 20 MEQ PO PACK: 20.0000 meq | PACK | Freq: Every day | ORAL | 0 refills | Status: AC

## 2021-07-01 MED ORDER — FREE WATER: 120.0000 mL | Freq: Every day | Status: AC

## 2021-07-01 NOTE — Discharge Summary (Signed)
Physician Discharge Summary  Patient ID: Derek Clarke MRN: [id number] DOB/AGE: 05-04-1956 65 y.o.  Admit date: 06/13/2021 Discharge date: 07/01/2021  Admission Diagnoses:  Discharge Diagnoses:  Principal Problem:   Acute on chronic respiratory failure with hypoxia ([place]) Active Problems:   Adenocarcinoma of lung, stage 4 (HCC)   COPD exacerbation (HCC)   SOB (shortness of breath)   Elevated troponin   Dysphagia   GAD (generalized anxiety disorder)   Moderate malnutrition (HCC)   Chronic diastolic CHF (congestive heart failure) (HCC)   Thrombocytopenia (HCC)   Hypokalemia   Hyponatremia   Discharged Condition: fair  Hospital Course:  65 year old man with past medical history of adenocarcinoma of the lung stage IV with mets to liver and bone.,  Chronic hypoxic respiratory failure on 4 L, COPD, anxiety.  He was admitted 14 days ago and treated for acute on chronic hypoxic respiratory failure, postobstructive pneumonia and COPD exacerbation.  The patient's also had dysphagia and was having aspiration and it was recommended for PEG tube placement.  PEG tube placed on 10/4.  Dysphagia. Patient has G-tube placed, x-ray showed free air.  Discussed with radiology, there is no concern for small amount of free air.  Repeated KUB free air is decreasing in amount. Tube feeding was started yesterday, patient tolerating well.  Nursing has also taught patient how to feeding himself.  He is advised to continue strict n.p.o., tube feeding only. Social worker has set up feeding material, PT/OT eval the patient, patient does not need nursing home from their standpoint.  At this point, patient is medically stable to be discharged.   Acute on chronic hypoxemic respite failure. COPD exacerbation. Lung cancer. Postobstructive pneumonia. Continue steroid taper, oxygenation appears to baseline. Antibiotics are completed.   Stage IV adenocarcinoma of the lung with metastasis to liver and  bone. Thrombocytopenia secondary to chemo. Leukopenia secondary to chemotherapy. Patient will be continued to be followed by oncology as outpatient.  Chronic diastolic congestive heart failure  I have placed patient back on Lasix and potassium at time of discharge.  Prescription sent separately from this note   Constipation. Patient only agreeable to take MiraLAX.  Consults: None  Significant Diagnostic Studies:   Treatments: antibiotics, PEG tube  Discharge Exam: Blood pressure 110/68, pulse 97, temperature 98.2 F (36.8 C), temperature source Oral, resp. rate 20, height 5\' 9"  (1.753 m), weight 90.7 kg, SpO2 96 %. General appearance: alert and cooperative Resp: Decreased Breathing sounds without wheezes. Cardio: regular rate and rhythm, S1, S2 normal, no murmur, click, rub or gallop GI: soft, non-tender; bowel sounds normal; no masses,  no organomegaly Extremities: extremities normal, atraumatic, no cyanosis or edema  Disposition: Discharge disposition: 01-Home or Self Care       Discharge Instructions     Diet general   Complete by: As directed    Strictly NPO, tube feeding only   Increase activity slowly   Complete by: As directed       Allergies as of 07/01/2021   No Known Allergies      Medication List     STOP taking these medications    chlorpheniramine 4 MG tablet Commonly known as: CHLOR-TRIMETON   diphenhydrAMINE 25 MG tablet Commonly known as: BENADRYL   guaiFENesin 600 MG 12 hr tablet Commonly known as: MUCINEX Replaced by: guaiFENesin 100 MG/5ML Soln   ibuprofen 400 MG tablet Commonly known as: ADVIL   prochlorperazine 10 MG tablet Commonly known as: COMPAZINE       TAKE these  medications    albuterol 108 (90 Base) MCG/ACT inhaler Commonly known as: VENTOLIN HFA Inhale 2 puffs into the lungs every 6 (six) hours as needed for wheezing or shortness of breath.   ALPRAZolam 0.5 MG tablet Commonly known as: XANAX Place 1 tablet  (0.5 mg total) into feeding tube 2 (two) times daily as needed for anxiety. What changed: how to take this   busPIRone 5 MG tablet Commonly known as: BUSPAR Take 1 tablet (5 mg total) by mouth 3 (three) times daily.   feeding supplement (OSMOLITE 1.5 CAL) Liqd Place 237 mLs into feeding tube 6 (six) times daily. What changed:  how to take this when to take this   feeding supplement (PROSource TF) liquid Place 45 mLs into feeding tube 3 (three) times daily. What changed: You were already taking a medication with the same name, and this prescription was added. Make sure you understand how and when to take each.   fluticasone furoate-vilanterol 100-25 MCG/INH Aepb Commonly known as: Breo Ellipta Inhale 1 puff into the lungs daily.   free water Soln Place 120 mLs into feeding tube 6 (six) times daily.   guaiFENesin 100 MG/5ML Soln Commonly known as: ROBITUSSIN Place 10 mLs (200 mg total) into feeding tube every 4 (four) hours as needed for cough or to loosen phlegm. Replaces: guaiFENesin 600 MG 12 hr tablet   lactulose 10 GM/15ML solution Commonly known as: CHRONULAC Place 30 mLs (20 g total) into feeding tube 2 (two) times daily as needed for mild constipation.   multivitamin with minerals Tabs tablet Place 1 tablet into feeding tube daily. What changed: how to take this   oxyCODONE 5 MG immediate release tablet Commonly known as: Oxy IR/ROXICODONE Place 1 tablet (5 mg total) into feeding tube every 4 (four) hours as needed for moderate pain.   polyethylene glycol 17 g packet Commonly known as: MiraLax Place 17 g into feeding tube daily.   predniSONE 20 MG tablet Commonly known as: DELTASONE Place 1.5 tablets (30 mg total) into feeding tube daily with breakfast for 2 days, THEN 1 tablet (20 mg total) daily with breakfast for 3 days, THEN 0.5 tablets (10 mg total) daily with breakfast for 3 days. Start taking on: July 02, 2021               Durable Medical  Equipment  (From admission, onward)           Start     Ordered   06/19/21 2010  For home use only DME 3 n 1  Once        06/19/21 0721   06/19/21 0722  For home use only DME Shower stool  Once        06/19/21 0712            Follow-up Information     Flora Lipps, MD Follow up in 2 week(s).   Specialties: Pulmonary Disease, Cardiology Contact information: [address] [place] [address] [phone]                35 minutes Signed: Sharen Hones 07/01/2021, 10:27 AM

## 2021-07-01 NOTE — Progress Notes (Signed)
Could not give himself tube feeding reliably, will hold discharge. Consult palliative care.

## 2021-07-01 NOTE — Consult Note (Addendum)
Consultation Note Date: 07/01/2021   Patient Name: Derek Clarke  DOB: 27-Jan-1956  MRN: [id number]  Age / Sex: 65 y.o., male  PCP: Pcp, No Referring Physician: Sharen Hones, MD  Reason for Consultation: Establishing goals of care  HPI/Patient Profile: 65 year old man with past medical history of adenocarcinoma of the lung stage IV with mets to liver and bone.,  Chronic hypoxic respiratory failure on 4 L, COPD, anxiety.  He was admitted 14 days ago and treated for acute on chronic hypoxic respiratory failure, postobstructive pneumonia and COPD exacerbation.  The patient's also had dysphagia and was having aspiration and it was recommended for PEG tube placement.  PEG tube placed on 10/4.  Clinical Assessment and Goals of Care: Patient is sitting in bedside chair with brother at bedside. Patient is not married and does not have children. His brother would be his surrogate Media planner.    We discussed his diagnosis, prognosis, GOC, EOL wishes disposition and options.  Created space and opportunity for patient  to explore thoughts and feelings regarding current medical information.   A detailed discussion was had today regarding advanced directives.  Concepts specific to code status, artifical feeding and hydration, IV antibiotics and rehospitalization were discussed.  The difference between an aggressive medical intervention path and a comfort care path was discussed.  Values and goals of care important to patient and family were attempted to be elicited.  Discussed limitations of medical interventions to prolong quality of life in some situations and discussed the concept of human mortality.  He tells me he initially did not want a feeding tube as he thought he would be okay with oral intake, but is happy he received it. He tells me he is squeamish to use the feeding tube because of pain at the insertion  site. He states his pain was well controlled prior to PEG placement. He has numerous bottles of Gatorade present in room. He states he uses it to swish with, as he is aware he is not supposed to eat or drink, and understands this could lead to aspiration PNA.      Attempted to discuss [place]. He simply states "whatever I said previously is what I want". Brother speaks up. He tells me he is a [employer] manager. He advises patient he needs to hear the answers to the questions himself. Patient did not answer these questions. Questions asked of me from patient's brother and those were answered. Oncology in to speak with patient and family.   SUMMARY OF RECOMMENDATIONS   --Patient states he does not want his feeding tube touched because of pain at the insertion site. He states he has no issues with having or using the tube. --GOC broached and attempted to facilitate a conversation between he and his brother, with no clear answers from him today. Brother is aware of what information he needs for the future to help with decision making.  --Recommend follow up with Billey Chang NP at the cancer center for further [place].  Primary Diagnoses: Present on Admission:  Acute on chronic respiratory failure with hypoxia (HCC)  SOB (shortness of breath)  Elevated troponin  GAD (generalized anxiety disorder)   I have reviewed the medical record, interviewed the patient and family, and examined the patient. The following aspects are pertinent.  Past Medical History:  Diagnosis Date   Adenocarcinoma ([place])    lung; stage 4 with mets to liver   Chronic respiratory failure with hypoxia (HCC)    baseline 4 L [place]   COPD (chronic obstructive pulmonary disease) (HCC)    GAD (generalized anxiety disorder)    Social History   Socioeconomic History   Marital status: Single    Spouse name: Not on file   Number of children: Not on file   Years of education: Not on file   Highest education level: Not on file  Occupational  History   Not on file  Tobacco Use   Smoking status: Former    Packs/day: 1.00    Years: 30.00    Pack years: 30.00    Types: Cigarettes    Quit date: 03/26/2021    Years since quitting: 0.2   Smokeless tobacco: Never  Substance and Sexual Activity   Alcohol use: Yes   Drug use: Yes    Types: Marijuana    Comment: quit approx. 5 years ago   Sexual activity: Not on file  Other Topics Concern   Not on file  Social History Narrative   Lives by himself. Brother Frederick "[place] stay with pt. If needed    Social Determinants of Health   Financial Resource Strain: Not on file  Food Insecurity: Not on file  Transportation Needs: Not on file  Physical Activity: Not on file  Stress: Not on file  Social Connections: Not on file   Family History  Problem Relation Age of Onset   Lung disease Mother    Dementia Father    Scheduled Meds:  budesonide (PULMICORT) nebulizer solution  0.5 mg Nebulization BID   Chlorhexidine Gluconate Cloth  6 each Topical Daily   docusate sodium  200 mg Oral BID   feeding supplement (OSMOLITE 1.5 CAL)  237 mL Per Tube 6 X Daily   feeding supplement (PROSource TF)  45 mL Per Tube TID   fentaNYL (SUBLIMAZE) injection  50 mcg Intravenous Once   fluticasone  1 spray Each Nare Daily   free water  120 mL Per Tube 6 X Daily   furosemide  40 mg Oral Daily   guaiFENesin  600 mg Oral BID   ipratropium-albuterol  3 mL Nebulization Q6H   lactulose  20 g Oral BID   loratadine  10 mg Oral Daily   midazolam  1 mg Intravenous Once   pantoprazole (PROTONIX) IV  40 mg Intravenous Daily   polyethylene glycol  17 g Per Tube Daily   potassium chloride  20 mEq Oral BID   predniSONE  30 mg Oral Q breakfast   Followed by   Derrill Memo ON 07/02/2021] predniSONE  20 mg Oral Q breakfast   Followed by   Derrill Memo ON 07/06/2021] predniSONE  10 mg Oral Q breakfast   Followed by   Derrill Memo ON 07/10/2021] predniSONE  5 mg Oral Q breakfast   sodium chloride flush  10-40 mL  Intracatheter Q12H   Continuous Infusions: PRN Meds:.acetaminophen **OR** acetaminophen, albuterol, ALPRAZolam, alum & mag hydroxide-simeth, diphenhydrAMINE, iohexol, ondansetron (ZOFRAN) IV, oxyCODONE, oxyCODONE-acetaminophen, sodium chloride flush Medications Prior to Admission:  Prior to Admission medications  Medication Sig Start Date End Date Taking? Authorizing Provider  albuterol (VENTOLIN HFA) 108 (90 Base) MCG/ACT inhaler Inhale 2 puffs into the lungs every 6 (six) hours as needed for wheezing or shortness of breath. 05/21/21  Yes Lorella Nimrod, MD  busPIRone (BUSPAR) 5 MG tablet Take 1 tablet (5 mg total) by mouth 3 (three) times daily. 06/09/21  Yes Mercy Riding, MD  chlorpheniramine (CHLOR-TRIMETON) 4 MG tablet Take 4 mg by mouth 2 (two) times daily as needed for allergies.   Yes [provider]  diphenhydrAMINE (BENADRYL) 25 MG tablet Take 25 mg by mouth every 6 (six) hours as needed.   Yes [provider]  fluticasone furoate-vilanterol (BREO ELLIPTA) 100-25 MCG/INH AEPB Inhale 1 puff into the lungs daily. 06/09/21  Yes Mercy Riding, MD  guaiFENesin (MUCINEX) 600 MG 12 hr tablet Take 600 mg by mouth 2 (two) times daily as needed for cough or to loosen phlegm.   Yes [provider]  guaiFENesin (ROBITUSSIN) 100 MG/5ML SOLN Place 10 mLs (200 mg total) into feeding tube every 4 (four) hours as needed for cough or to loosen phlegm. 07/01/21  Yes Sharen Hones, MD  ibuprofen (ADVIL) 400 MG tablet Take 200 mg by mouth every 4 (four) hours as needed for mild pain.   Yes [provider]  polyethylene glycol (MIRALAX) 17 g packet Place 17 g into feeding tube daily. 07/01/21  Yes Sharen Hones, MD  ALPRAZolam Duanne Moron) 0.5 MG tablet Place 1 tablet (0.5 mg total) into feeding tube 2 (two) times daily as needed for anxiety. 07/01/21   Sharen Hones, MD  feeding supplement (ENSURE ENLIVE / ENSURE PLUS) LIQD Take 237 mLs by mouth 2 (two) times daily between meals. 05/22/21    Lorella Nimrod, MD  furosemide (LASIX) 40 MG tablet Place 1 tablet (40 mg total) into feeding tube daily. 07/01/21   Sharen Hones, MD  lactulose (CHRONULAC) 10 GM/15ML solution Place 30 mLs (20 g total) into feeding tube 2 (two) times daily as needed for mild constipation. 07/01/21   Sharen Hones, MD  Multiple Vitamin (MULTIVITAMIN WITH MINERALS) TABS tablet Place 1 tablet into feeding tube daily. 07/01/21   Sharen Hones, MD  Nutritional Supplements (FEEDING SUPPLEMENT, OSMOLITE 1.5 CAL,) LIQD Place 237 mLs into feeding tube 6 (six) times daily. 07/01/21   Sharen Hones, MD  Nutritional Supplements (FEEDING SUPPLEMENT, PROSOURCE TF,) liquid Place 45 mLs into feeding tube 3 (three) times daily. 07/01/21   Sharen Hones, MD  oxyCODONE (OXY IR/ROXICODONE) 5 MG immediate release tablet Place 1 tablet (5 mg total) into feeding tube every 4 (four) hours as needed for moderate pain. 07/01/21   Sharen Hones, MD  potassium chloride (KLOR-CON) 20 MEQ packet Place 20 mEq into feeding tube daily. 07/01/21 07/31/21  Sharen Hones, MD  predniSONE (DELTASONE) 20 MG tablet Place 1.5 tablets (30 mg total) into feeding tube daily with breakfast for 2 days, THEN 1 tablet (20 mg total) daily with breakfast for 3 days, THEN 0.5 tablets (10 mg total) daily with breakfast for 3 days. 07/02/21 07/10/21  Sharen Hones, MD  prochlorperazine (COMPAZINE) 10 MG tablet Take 1 tablet (10 mg total) by mouth every 6 (six) hours as needed (Nausea or vomiting). 05/27/21   Lloyd Huger, MD  Water For Irrigation, Sterile (FREE WATER) SOLN Place 120 mLs into feeding tube 6 (six) times daily. 07/01/21   Sharen Hones, MD   No Known Allergies Review of Systems  Gastrointestinal:  Positive for abdominal pain.  Physical Exam Pulmonary:     Effort: Pulmonary effort is normal.     Comments: Speaks in a whisper.  Neurological:     Mental Status: He is alert.    Vital Signs: BP 99/73 (BP Location: Right Arm)   Pulse 95   Temp 98 F (36.7 C)    Resp 18   Ht 5\' 9"  (1.753 m)   Wt 90.7 kg   SpO2 97%   BMI 29.53 kg/m  Pain Scale: 0-10   Pain Score: Asleep   SpO2: SpO2: 97 % O2 Device:SpO2: 97 % O2 Flow Rate: .O2 Flow Rate (L/min): 4 L/min  IO: Intake/output summary:  Intake/Output Summary (Last 24 hours) at 07/01/2021 1559 Last data filed at 07/01/2021 0900 Gross per 24 hour  Intake --  Output 400 ml  Net -400 ml    LBM: Last BM Date:  (pt not sure but states he thinks it was days prior to surgery) Baseline Weight: Weight: 100 kg Most recent weight: Weight: 90.7 kg       Time In: 3:20 Time Out: 4:10 Time Total: 50 min Greater than 50%  of this time was spent counseling and coordinating care related to the above assessment and plan.  Signed by: Asencion Gowda, NP   Please contact Palliative Medicine Team phone at [phone] for questions and concerns.  For individual provider: See Shea Evans

## 2021-07-01 NOTE — Progress Notes (Signed)
Physical Therapy Treatment Patient Details Name: Derek Clarke MRN: [id number] DOB: Nov 08, 1955 Today's Date: 07/01/2021   History of Present Illness Pt is a 65 y/o M with h/o stage IV adenocarcinoma of the left lung with metastasis to the liver, former smoker, chronic hypoxic respiratory failure on 4 L continuous nasal cannula and COPD who is admitted to [facility] on 06/13/2021 with acute on chronic hypoxic respiratory failure after presenting from home [facility] ED complaining of shortness of breath. Note: pt with two recent admissions with similar presentation.  S/p PEG tube placement 06/28/21.    PT Comments    Pt sitting in recliner upon PT arrival; agreeable to PT session.  During session pt independent with transfers and SBA ambulating 90 feet in room; pt able to manage O2 tubing safely.  O2 sats 94% or greater on 4 L O2 via nasal cannula during sessions activities.  Pt's HR 80 bpm at rest but increased to 110 bpm with activity; HR decreased back down close to previous resting rate with sitting rest.  Will continue to focus on strengthening, activity tolerance, and progressive mobility during hospitalization.    Recommendations for follow up therapy are one component of a multi-disciplinary discharge planning process, led by the attending physician.  Recommendations may be updated based on patient status, additional functional criteria and insurance authorization.  Follow Up Recommendations  No PT follow up     Equipment Recommendations  None recommended by PT    Recommendations for Other Services       Precautions / Restrictions Precautions Precautions: Other (comment) Precaution Comments: Metastatic disease; PEG tube Restrictions Weight Bearing Restrictions: No     Mobility  Bed Mobility               General bed mobility comments: Deferred (pt in recliner beginning/end of session)    Transfers Overall transfer level: Independent Equipment  used: None Transfers: Sit to/from Stand Sit to Stand: Independent         General transfer comment: steady safe transfers  Ambulation/Gait Ambulation/Gait assistance: Supervision Gait Distance (Feet): 90 Feet Assistive device: None   Gait velocity: mildly decreased   General Gait Details: steady ambulation   Stairs             Wheelchair Mobility    Modified Rankin (Stroke Patients Only)       Balance Overall balance assessment: No apparent balance deficits (not formally assessed)                                          Cognition Arousal/Alertness: Awake/alert Behavior During Therapy: WFL for tasks assessed/performed;Flat affect Overall Cognitive Status: Within Functional Limits for tasks assessed                                 General Comments: Soft spoken; increased time to respond; pleasant and cooperative      Exercises      General Comments  Pt agreeable to PT session.      Pertinent Vitals/Pain Pain Assessment: Faces Faces Pain Scale: Hurts a little bit Pain Location: PEG tube site Pain Descriptors / Indicators: Discomfort Pain Intervention(s): Limited activity within patient's tolerance;Monitored during session;Repositioned    Home Living  Prior Function            PT Goals (current goals can now be found in the care plan section) Acute Rehab PT Goals Patient Stated Goal: "To improve breathing" PT Goal Formulation: With patient Time For Goal Achievement: 07/02/21 Potential to Achieve Goals: Fair Progress towards PT goals: Progressing toward goals    Frequency    Min 2X/week      PT Plan Current plan remains appropriate    Co-evaluation              AM-PAC PT "6 Clicks" Mobility   Outcome Measure  Help needed turning from your back to your side while in a flat bed without using bedrails?: None Help needed moving from lying on your back to sitting on the  side of a flat bed without using bedrails?: None Help needed moving to and from a bed to a chair (including a wheelchair)?: None Help needed standing up from a chair using your arms (e.g., wheelchair or bedside chair)?: None Help needed to walk in hospital room?: None Help needed climbing 3-5 steps with a railing? : None 6 Click Score: 24    End of Session Equipment Utilized During Treatment: Oxygen (4 L O2 via nasal cannula) Activity Tolerance: Patient tolerated treatment well Patient left: in chair;with call bell/phone within reach;with family/visitor present   PT Visit Diagnosis: Difficulty in walking, not elsewhere classified (R26.2)     Time: 8757-9728 PT Time Calculation (min) (ACUTE ONLY): 13 min  Charges:  $Therapeutic Exercise: 8-22 mins                     Leitha Bleak, PT 07/01/21, 5:22 PM

## 2021-07-01 NOTE — Progress Notes (Signed)
RN in room to administer meds and feeding. Pt asked if RN could come back at 1030am as he normally doesn't rake his meds this early. Will retry at 1030. Will continue to monitor.

## 2021-07-01 NOTE — Progress Notes (Signed)
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  INTERVAL HISTORY:   Patient continues to have significant shortness of breath and cough.  Had PEG tube placed earlier this week with continued tenderness, but otherwise tolerated the procedure well.  Given his significant aspiration, recommendation is to use PEG for 100% of nutrition.  REVIEW OF SYSTEMS:   Review of Systems  Constitutional:  Positive for malaise/fatigue. Negative for fever and weight loss.  HENT:  Negative for sore throat.   Respiratory:  Positive for cough and shortness of breath. Negative for hemoptysis and stridor.   Cardiovascular: Negative.  Negative for chest pain and leg swelling.  Gastrointestinal: Negative.  Negative for abdominal pain and nausea.  Genitourinary: Negative.  Negative for dysuria.  Musculoskeletal: Negative.  Negative for back pain.  Skin: Negative.  Negative for rash.  Neurological:  Positive for weakness. Negative for dizziness, focal weakness and headaches.  Psychiatric/Behavioral: Negative.  The patient is not nervous/anxious.    As per HPI. Otherwise, a complete review of systems is negative.  PAST MEDICAL HISTORY: Past Medical History:  Diagnosis Date   Adenocarcinoma ([place])    lung; stage 4 with mets to liver   Chronic respiratory failure with hypoxia (HCC)    baseline 4 L [place]   COPD (chronic obstructive pulmonary disease) (HCC)    GAD (generalized anxiety disorder)     PAST SURGICAL HISTORY: Past Surgical History:  Procedure Laterality Date   IR GASTROSTOMY TUBE MOD SED  06/28/2021   IR IMAGING GUIDED PORT INSERTION  06/03/2021    FAMILY HISTORY: Family History  Problem Relation Age of Onset   Lung disease Mother     Dementia Father     ADVANCED DIRECTIVES (Y/N):  _0 @  HEALTH MAINTENANCE: Social History   Tobacco Use   Smoking status: Former    Packs/day: 1.00    Years: 30.00    Pack years: 30.00    Types: Cigarettes    Quit date: 03/26/2021    Years since quitting: 0.2   Smokeless tobacco: Never  Substance Use Topics   Alcohol use: Yes   Drug use: Yes    Types: Marijuana    Comment: quit approx. 5 years ago     Colonoscopy:  PAP:  Bone density:  Lipid panel:  No Known Allergies  Current Facility-Administered Medications  Medication Dose Route Frequency Provider Last Rate Last Admin   acetaminophen (TYLENOL) tablet 650 mg  650 mg Oral Q6H PRN Howerter, Justin B, DO   650 mg at 06/28/21 1647   Or   acetaminophen (TYLENOL) suppository 650 mg  650 mg Rectal Q6H PRN Howerter, Justin B, DO       albuterol (PROVENTIL) (2.5 MG/3ML) 0.083% nebulizer solution 2.5 mg  2.5 mg Nebulization Q4H PRN Howerter, Justin B, DO   2.5 mg at 07/01/21 1800   ALPRAZolam (XANAX) tablet 0.5 mg  0.5 mg Oral QID PRN Loletha Grayer, MD   0.5 mg at 06/30/21 2212   alum & mag hydroxide-simeth (MAALOX/MYLANTA) 200-200-20 MG/5ML suspension 30 mL  30 mL Oral Q4H PRN Mansy, Jan A, MD   30 mL at 06/20/21 0106   budesonide (PULMICORT) nebulizer solution 0.5 mg  0.5 mg Nebulization BID Flora Lipps, MD   0.5 mg  at 07/01/21 1956   Chlorhexidine Gluconate Cloth 2 % PADS 6 each  6 each Topical Daily Sharen Hones, MD   6 each at 07/01/21 1040   diphenhydrAMINE (BENADRYL) capsule 25 mg  25 mg Oral Q6H PRN Bonnielee Haff, MD   25 mg at 06/27/21 2307   docusate sodium (COLACE) capsule 200 mg  200 mg Oral BID Wyvonnia Dusky, MD   200 mg at 06/30/21 2212   feeding supplement (OSMOLITE 1.5 CAL) liquid 237 mL  237 mL Per Tube 6 X Daily Sharen Hones, MD   237 mL at 07/01/21 1759   feeding supplement (PROSource TF) liquid 45 mL  45 mL Per Tube TID Sharen Hones, MD   45 mL at 07/01/21 1541   fentaNYL (SUBLIMAZE) injection  50 mcg  50 mcg Intravenous Once [place] Ouch, MD       fluticasone [facility]) 50 MCG/ACT nasal spray 1 spray  1 spray Each Nare Daily Loletha Grayer, MD   1 spray at 06/29/21 0815   free water 120 mL  120 mL Per Tube 6 X Daily Sharen Hones, MD   120 mL at 07/01/21 1800   furosemide (LASIX) tablet 40 mg  40 mg Oral Daily Loletha Grayer, MD   40 mg at 07/01/21 1042   guaiFENesin (MUCINEX) 12 hr tablet 600 mg  600 mg Oral BID Bonnielee Haff, MD   600 mg at 07/01/21 1042   iohexol (OMNIPAQUE) 350 MG/ML injection 25 mL  25 mL Per Tube Once PRN El-Abd, Joesph Fillers, MD       ipratropium-albuterol (DUONEB) 0.5-2.5 (3) MG/3ML nebulizer solution 3 mL  3 mL Nebulization Q6H Wieting, Richard, MD   3 mL at 07/01/21 1956   lactulose (CHRONULAC) 10 GM/15ML solution 20 g  20 g Oral BID Wyvonnia Dusky, MD   20 g at 07/01/21 1042   loratadine (CLARITIN) tablet 10 mg  10 mg Oral Daily Loletha Grayer, MD   10 mg at 07/01/21 1042   midazolam (VERSED) injection 1 mg  1 mg Intravenous Once [place] Ouch, MD       ondansetron [facility]) injection 4 mg  4 mg Intravenous Q6H PRN Howerter, Justin B, DO   4 mg at 06/19/21 2321   oxyCODONE (Oxy IR/ROXICODONE) immediate release tablet 5 mg  5 mg Oral Q4H PRN Loletha Grayer, MD   5 mg at 07/01/21 1049   oxyCODONE-acetaminophen (PERCOCET/ROXICET) 5-325 MG per tablet 1 tablet  1 tablet Per Tube Q4H PRN Sharen Hones, MD   1 tablet at 06/29/21 2216   pantoprazole (PROTONIX) injection 40 mg  40 mg Intravenous Daily Wyvonnia Dusky, MD   40 mg at 07/01/21 1042   polyethylene glycol (MIRALAX / GLYCOLAX) packet 17 g  17 g Per Tube Daily Sharen Hones, MD   17 g at 06/30/21 1838   potassium chloride (KLOR-CON) packet 20 mEq  20 mEq Oral BID Loletha Grayer, MD   20 mEq at 07/01/21 1042   predniSONE (DELTASONE) tablet 30 mg  30 mg Oral Q breakfast Kathie Dike, MD   30 mg at 07/01/21 1049   Followed by   Derrill Memo ON 07/02/2021] predniSONE (DELTASONE) tablet  20 mg  20 mg Oral Q breakfast Kathie Dike, MD       Followed by   Derrill Memo ON 07/06/2021] predniSONE (DELTASONE) tablet 10 mg  10 mg Oral Q breakfast Kathie Dike, MD       Followed by   Derrill Memo ON 07/10/2021] predniSONE (DELTASONE)  tablet 5 mg  5 mg Oral Q breakfast Kathie Dike, MD       sodium chloride flush (NS) 0.9 % injection 10-40 mL  10-40 mL Intracatheter Q12H Sharen Hones, MD   10 mL at 07/01/21 1043   sodium chloride flush (NS) 0.9 % injection 10-40 mL  10-40 mL Intracatheter PRN Sharen Hones, MD        OBJECTIVE: Vitals:   07/01/21 1700 07/01/21 1938  BP: 104/76 107/71  Pulse: 96 (!) 105  Resp: 18 18  Temp: 98.1 F (36.7 C) 98.4 F (36.9 C)  SpO2: 97% 99%     Body mass index is 29.53 kg/m.    ECOG FS:2 - Symptomatic, <50% confined to bed  General: Ill-appearing, no acute distress. Eyes: Pink conjunctiva, anicteric sclera. HEENT: Normocephalic, moist mucous membranes. Lungs: No audible wheezing or coughing. Heart: Regular rate and rhythm. Abdomen: PEG tube in place.   Musculoskeletal: No edema, cyanosis, or clubbing. Neuro: Alert, answering all questions appropriately. Cranial nerves grossly intact. Skin: No rashes or petechiae noted. Psych: Normal affect.   LAB RESULTS:  Lab Results  Component Value Date   NA 134 (L) 07/01/2021   K 3.8 07/01/2021   CL 93 (L) 07/01/2021   CO2 33 (H) 07/01/2021   GLUCOSE 107 (H) 07/01/2021   BUN 21 07/01/2021   CREATININE 0.51 (L) 07/01/2021   CALCIUM 9.3 07/01/2021   PROT 6.7 06/14/2021   ALBUMIN 3.3 (L) 06/14/2021   AST 23 06/14/2021   ALT 26 06/14/2021   ALKPHOS 111 06/14/2021   BILITOT 0.9 06/14/2021   GFRNONAA >60 07/01/2021    Lab Results  Component Value Date   WBC 4.6 07/01/2021   NEUTROABS 2.9 07/01/2021   HGB 13.1 07/01/2021   HCT 39.2 07/01/2021   MCV 83.1 07/01/2021   PLT 144 (L) 07/01/2021     STUDIES: DG Chest 2 View  Result Date: 06/23/2021 CLINICAL DATA:  COPD exacerbation EXAM:  CHEST - 2 VIEW COMPARISON:  06/14/2021 FINDINGS: Frontal and lateral views of the chest demonstrates stable right chest wall port. The cardiac silhouette is unremarkable. Dense left upper and right middle lobe consolidation again noted. Chronic vascular congestion. No effusion or pneumothorax. IMPRESSION: 1. Persistent left upper and right middle lobe consolidation consistent with postobstructive changes based on hilar masses seen on previous CT. 2. Chronic vascular congestion. Electronically Signed   By: Randa Ngo M.D.   On: 06/23/2021 20:19   DG Abd 1 View  Result Date: 06/30/2021 CLINICAL DATA:  History of adenocarcinoma EXAM: ABDOMEN - 1 VIEW COMPARISON:  X-ray abdomen dated June 29, 2021 FINDINGS: Free intraperitoneal air, decreased when compared with prior radiograph. Nonobstructive bowel gas pattern. Moderate stool burden in the right colon. Gastrostomy tube in place. New heterogeneous opacities of the left lower lung. IMPRESSION: Decreased free intraperitoneal air, likely postprocedural and related to gastrostomy tube placement. New heterogeneous opacities of the left lower lung, concerning for infection or aspiration. Recommend dedicated chest x-ray for better evaluation. Electronically Signed   By: Yetta Glassman M.D.   On: 06/30/2021 10:56   DG Abd 1 View  Result Date: 06/29/2021 CLINICAL DATA:  Gastrostomy tube complication EXAM: ABDOMEN - 1 VIEW COMPARISON:  06/28/2021 FINDINGS: Lung bases clear. Free air again identified under the LEFT hemidiaphragm. Gastrostomy tube LEFT upper quadrant. Stool in colon with small amount of contrast in small bowel loop mid abdomens in LEFT abdomen. Few scattered sclerotic foci in the pelvis and proximal LEFT femur, patient with known osseous  metastases. IMPRESSION: Persistent free intraperitoneal air under the LEFT hemidiaphragm. Electronically Signed   By: Lavonia Dana M.D.   On: 06/29/2021 17:52   DG Abd 1 View  Result Date: 06/28/2021 CLINICAL  DATA:  Abdomen pain recent procedure EXAM: ABDOMEN - 1 VIEW COMPARISON:  06/28/2021, 06/23/2021 FINDINGS: Trace free air beneath the right diaphragm with small moderate volume free air beneath the left diaphragm. Patient is status post percutaneous gastrostomy. There is a nonobstructed gas pattern. IMPRESSION: Small moderate free air within the abdomen, potentially related to recent percutaneous gastrostomy tube placement. Consider serial radiographic follow-up to ensure resolution/decreasing free air. These results will be called to the ordering clinician or representative by the Radiologist Assistant, and communication documented in the PACS or [employer]. Electronically Signed   By: Donavan Foil M.D.   On: 06/28/2021 22:51   CT Angio Chest PE W and/or Wo Contrast  Result Date: 06/07/2021 CLINICAL DATA:  Tachycardia, vomiting, metastatic lung cancer EXAM: CT ANGIOGRAPHY CHEST WITH CONTRAST TECHNIQUE: Multidetector CT imaging of the chest was performed using the standard protocol during bolus administration of intravenous contrast. Multiplanar CT image reconstructions and MIPs were obtained to evaluate the vascular anatomy. CONTRAST:  72m OMNIPAQUE IOHEXOL 350 MG/ML SOLN COMPARISON:  06/08/2011, 05/15/2021, FINDINGS: Cardiovascular: This is a technically adequate evaluation of the pulmonary vasculature. No filling defects or pulmonary emboli. Stable left hilar soft tissue mass encasing the left upper and left lower pulmonary arteries. Stable trace pericardial effusion. The heart is not enlarged. No evidence of thoracic aortic aneurysm or dissection. Mild atherosclerosis of the aorta and coronary vessels. Mediastinum/Nodes: There is segmental wall thickening of the mid to distal thoracic esophagus. Confluent soft tissue within the mediastinum and bilateral hila consistent with adenopathy, not grossly changed since prior study. An enlarged left supraclavicular lymph node on image 9/4 measures 13 mm in  short axis, unchanged. Thyroid and trachea are unremarkable. Lungs/Pleura: There are trace bilateral pleural effusions. Since the prior exam, there is progressive near complete consolidation of the left upper lobe, which may reflect progressive disease and/or postobstructive change. There is diffuse bilateral bronchial wall thickening, greatest at the lung bases. Continued right middle lobe collapse with occlusion of the right middle lobe bronchus again noted. Upper Abdomen: There are stable bilateral adrenal nodules, measuring up to 2.2 cm on the right. Multiple enlarged lymph nodes are again seen within the central upper abdomen, largest measuring 1.7 cm just posterior to the gastric cardia. Multiple hypodensities are again seen within the liver consistent with metastatic disease, grossly unchanged since prior study. Musculoskeletal: Stable sclerotic focus within the left T10 transverse process and within the T9 vertebral body, compatible with bony metastases. No acute displaced fractures. Reconstructed images demonstrate no additional findings. Review of the MIP images confirms the above findings. IMPRESSION: 1. No evidence of pulmonary embolus. 2. Persistent left hilar mass surrounding the left hilar vessels, with increasing near total consolidation of the left upper lobe since prior study. This may be progression of malignancy versus postobstructive change. 3. Continued coalescing lymphadenopathy throughout the mediastinum and bilateral hila. 4. Bilateral bronchial wall thickening, greatest at the lung bases. 5. Trace bilateral pleural effusions. 6. Segmental wall thickening of the mid to distal thoracic esophagus, which could be inflammatory or infectious. 7. Upper abdominal metastases with continued multiple liver lesions, bilateral adrenal nodularity, and upper abdominal adenopathy. Electronically Signed   By: MRanda NgoM.D.   On: 06/07/2021 15:12   IR GASTROSTOMY TUBE MOD SED  Result Date:  06/28/2021 INDICATION: Cancer, dysphagia with malnutrition EXAM: Placement of percutaneous gastrostomy tube using fluoroscopic guidance MEDICATIONS: Glucagon 1 mg IV ANESTHESIA/SEDATION: Sedation provided by the Department of Anesthesiology. Refer to separate anesthesia records. CONTRAST:  20 mL-administered into the gastric lumen. FLUOROSCOPY TIME:  Fluoroscopy Time: 2 minutes 30 seconds (35 mGy). COMPLICATIONS: None immediate. PROCEDURE: Informed written consent was obtained from the patient after a thorough discussion of the procedural risks, benefits and alternatives. All questions were addressed. Maximal Sterile Barrier Technique was utilized including caps, mask, sterile gowns, sterile gloves, sterile drape, hand hygiene and skin antiseptic. A timeout was performed prior to the initiation of the procedure. Review of pre-procedure CT scan demonstrates an adequate window for percutaneous placement of a gastrostomy tube. The patient was placed supine on the exam table. An angled 5 French catheter was passed through the nares into the stomach by the anesthesia team. It was secured to the nose, and the stomach was insufflated with air. The abdomen was prepped and draped in the standard sterile fashion. After insufflating the stomach with air, puncture sites were selected and local analgesia was obtained with 1% lidocaine. Using fluoroscopic guidance, a gastropexy needle was advanced into the stomach and the T-bar suture was released. Entry into the stomach was confirmed with fluoroscopy, aspiration of air, and injection of contrast material. This was repeated with an additional gastropexy suture (for a total of 3 fasteners). At the center of these gastropexy sutures, a dermatotomy was performed. An 18 gauge needle was then passed into the stomach, and position within the gastric lumen again confirmed under fluoroscopy using aspiration of air and injection of contrast material. An Amplatz guidewire was passed  through this needle and intraluminal placement was confirmed with fluoroscopy. The needle was removed, and over the guidewire, a 20 French balloon gastrostomy tube with a coaxial 10 mm balloon was advanced into the percutaneous tract. Dilation of the percutaneous tract was then performed using the balloon, followed by advancement of the gastrostomy tube into the gastric lumen. The retention balloon was then inflated with 19 mL of sterile water, and the tube was brought back to the gastric wall. The wire and balloon were removed. The external bumper was brought to the skin. Location of the gastrostomy tube within the stomach was then confirmed with injection of contrast material, opacifying the gastric lumen. The gastrostomy tube was flushed with sterile water, and secured to the skin using a dressing. The patient tolerated the procedure well without immediate complication, and was transferred to recovery in stable condition. IMPRESSION: Successful placement of a 20 French percutaneous gastrostomy tube using fluoroscopic guidance. Electronically Signed   By: Albin Felling M.D.   On: 06/28/2021 14:42   NM PET Image Initial (PI) Skull Base To Thigh  Result Date: 06/14/2021 CLINICAL DATA:  Initial treatment strategy for lung nodule. Lymphadenopathy with liver lesions. EXAM: NUCLEAR MEDICINE PET SKULL BASE TO THIGH TECHNIQUE: 12.0 mCi F-18 FDG was injected intravenously. Full-ring PET imaging was performed from the skull base to thigh after the radiotracer. CT data was obtained and used for attenuation correction and anatomic localization. Fasting blood glucose: 122 mg/dl COMPARISON:  CTA Chest 06/07/2021 FINDINGS: Mediastinal blood pool activity: SUV max 0.8 Liver activity: SUV max NA NECK: 12 mm left cervical node on 78/4 is hypermetabolic with SUV max = 4.3. Small hypermetabolic lymph nodes evident right lower neck. Bilateral supraclavicular hypermetabolic lymph nodes including 11 mm left supraclavicular node on  60/3 with SUV max = 7.4. Markedly Markedly hypermetabolic left  thyroid nodule measures 1.4 cm on image 60/3 with SUV max = 21.7. Incidental CT findings: none CHEST: Hypermetabolic mediastinal lymphadenopathy evident. Index subcarinal measures 18 mm short axis on 01/02/3 with SUV max = 7.0 hypermetabolic lymph nodes are seen in both hilar regions into the prevascular space. 16 mm prevascular node on 87/3 demonstrates SUV max = 7.0. Marked wall thickening noted in the mid esophagus, at approximately the level of the carina. Although somewhat difficult to assess given the adjacent hypermetabolic lymphadenopathy, there does appear to be marked hypermetabolism in the wall of this abnormal appearing esophagus with SUV max = 7.7. Areas of collapse/consolidation in the left upper lobe, right middle lobe and infrahilar left lower lobe show substantial hypermetabolism. There is diffuse interstitial opacity in both lungs with innumerable tiny bilateral pulmonary nodules. Incidental CT findings: Trace pericardial effusion. Right Port-A-Cath tip is positioned in the distal SVC. Coronary artery calcification is evident. Mild atherosclerotic calcification is noted in the wall of the thoracic aorta. ABDOMEN/PELVIS: Multiple hypermetabolic liver lesions evident. 18 mm subcapsular lesion anterior left liver on 150/3 demonstrates SUV max = 7.7. Hypermetabolic gastrohepatic ligament lymph nodes measure up to 14 mm on 150/3 with SUV max = 6.6 12 mm left para-aortic node on 224/8 is hypermetabolic with SUV max = 4.4. 2.4 cm right adrenal nodule shows low attenuation, suggesting adenoma with associated hypermetabolism SUV max = 5.3. Incidental CT findings: Prostate gland is enlarged. Scattered diverticuli noted left colon without diverticulitis. There is mild atherosclerotic calcification of the abdominal aorta without aneurysm. SKELETON: Scattered hypermetabolic bone metastases evident. 12 mm posterior right iliac sclerotic lesion on  02/20 8/3 shows SUV max = 5.6. Sacral metastases demonstrate SUV max = 5.3 subtle 13 mm left femoral neck lesion demonstrates SUV max = 5.3. Incidental CT findings: none IMPRESSION: 1. Similar right middle and left upper lobe collapse with associated marked hypermetabolism. There is diffuse interstitial and micro nodularity in both lungs consistent with metastatic disease. 2. Hypermetabolic metastatic lymphadenopathy in the neck, mediastinum, hilar regions, gastrohepatic ligament, and retroperitoneum. 3. Marked hypermetabolism identified in the mid esophagus with associated esophageal wall thickening. This may be related to secondary involvement although primary esophageal neoplasm not excluded. 4. Hypermetabolic liver and bone metastases. 5. Hypermetabolic right adrenal nodule. This is felt to be likely an adenoma although metastatic involvement is certainly possible. 6. Hypermetabolic left thyroid nodule. Recommend thyroid US and biopsy. (Ref: J Am Coll Radiol. 2015 Feb;12(2): 143-50). 7.  Aortic Atherosclerois (ICD10-170.0) Electronically Signed   By: Misty Stanley M.D.   On: 06/14/2021 12:27   DG Chest Port 1 View  Result Date: 06/14/2021 CLINICAL DATA:  Dyspnea EXAM: PORTABLE CHEST 1 VIEW COMPARISON:  06/13/2021 FINDINGS: Cardiac shadow is stable. Right chest wall port is again seen and stable. Volume loss in the left hemithorax is noted with diffuse increased airspace opacity stable from the previous day. Mild right basilar opacity is noted stable in appearance as well. No new focal abnormality is noted. IMPRESSION: Stable bilateral airspace opacities similar to that seen on recent CT and plain film examination. Electronically Signed   By: Inez Catalina M.D.   On: 06/14/2021 19:39   DG Chest Port 1 View  Result Date: 06/13/2021 CLINICAL DATA:  Shortness of breath.  Recent pneumonia. EXAM: PORTABLE CHEST 1 VIEW COMPARISON:  Radiograph and chest CT 06/07/2021. FINDINGS: Right chest port remains in place.  Similar volume loss in the left hemithorax with diffusely increased opacity, opacity throughout the left upper lobe with seen on  recent CT. Opacity at the right lung base corresponds to right middle lobe collapse. Background bronchial thickening. Trace pleural effusions on CT are not well demonstrated by radiograph. No pneumothorax or significant change from prior imaging. Heart size is grossly stable. IMPRESSION: 1. No significant change from prior imaging. 2. Volume loss in the left hemithorax with diffusely increased opacity throughout the left upper lung zone, upper lobe consolidation seen on recent CT. 3. Right lung base opacity corresponds to right middle lobe collapse. 4. Background bronchial thickening. Electronically Signed   By: Keith Rake M.D.   On: 06/13/2021 18:36   DG Chest Portable 1 View  Result Date: 06/07/2021 CLINICAL DATA:  Cough, shortness of breath. EXAM: PORTABLE CHEST 1 VIEW COMPARISON:  May 14, 2021. FINDINGS: Stable cardiomediastinal silhouette. Interval placement of right internal jugular Port-A-Cath with distal tip in expected position of the SVC. No pneumothorax is noted. Left upper lobe consolidation is noted concerning for pneumonia or atelectasis. Probable central pulmonary vascular congestion is noted. Bony thorax is unremarkable. IMPRESSION: Left upper lobe opacity is noted concerning for pneumonia or atelectasis. Electronically Signed   By: Marijo Conception M.D.   On: 06/07/2021 12:13   [place] OR NERVE BLOCK-IMAGE ONLY [facility])  Result Date: 06/28/2021 There is no interpretation for this exam.  This order is for images obtained during a surgical procedure.  Please See "Surgeries" Tab for more information regarding the procedure.   ECHOCARDIOGRAM COMPLETE  Result Date: 06/08/2021    ECHOCARDIOGRAM REPORT   Patient Name:   BEECHER FURIO Date of Exam: 06/08/2021 Medical Rec #:  [id number]      Height:       69.0 in Accession #:    [id number]     Weight:       220.7 lb  Date of Birth:  1956/03/20      BSA:          2.154 m Patient Age:    65 years       BP:           130/81 mmHg Patient Gender: M              HR:           108 bpm. Exam Location:  [facility] Procedure: 2D Echo, Cardiac Doppler, Color Doppler and Strain Analysis Indications:    CHF-acute diastolic Q25.95  History:        Patient has no prior history of Echocardiogram examinations.                 COPD.  Sonographer:    Sherrie Sport Referring Phys: [id number] Ivor Costa Diagnosing      Kate Sable MD Phys:  Sonographer Comments: Suboptimal apical window. Global longitudinal strain was attempted. IMPRESSIONS  1. Left ventricular ejection fraction, by estimation, is 65 to 70%. The left ventricle has normal function. The left ventricle has no regional wall motion abnormalities. Left ventricular diastolic parameters are consistent with Grade I diastolic dysfunction (impaired relaxation).  2. Right ventricular systolic function is normal. The right ventricular size is mildly enlarged.  3. A small pericardial effusion is present.  4. The mitral valve is normal in structure. No evidence of mitral valve regurgitation.  5. The aortic valve is tricuspid. Aortic valve regurgitation is not visualized. FINDINGS  Left Ventricle: Left ventricular ejection fraction, by estimation, is 65 to 70%. The left ventricle has normal function. The left ventricle has no regional wall motion abnormalities. Global longitudinal strain performed but  not reported based on interpreter judgement due to suboptimal tracking. 3D left ventricular ejection fraction analysis performed but not reported based on interpreter judgement due to suboptimal quality. The left ventricular internal cavity size was normal in size. There is no left ventricular hypertrophy. Left ventricular diastolic parameters are consistent with Grade I diastolic dysfunction (impaired relaxation). Right Ventricle: The right ventricular size is mildly enlarged. No increase in right ventricular  wall thickness. Right ventricular systolic function is normal. Left Atrium: Left atrial size was normal in size. Right Atrium: Right atrial size was normal in size. Pericardium: A small pericardial effusion is present. Mitral Valve: The mitral valve is normal in structure. No evidence of mitral valve regurgitation. Tricuspid Valve: The tricuspid valve is grossly normal. Tricuspid valve regurgitation is trivial. Aortic Valve: The aortic valve is tricuspid. Aortic valve regurgitation is not visualized. Aortic valve mean gradient measures 6.0 mmHg. Aortic valve peak gradient measures 10.5 mmHg. Aortic valve area, by VTI measures 2.89 cm. Pulmonic Valve: The pulmonic valve was not well visualized. Pulmonic valve regurgitation is not visualized. Aorta: The aortic root is normal in size and structure. IAS/Shunts: No atrial level shunt detected by color flow Doppler.  LEFT VENTRICLE PLAX 2D LVIDd:         4.80 cm  Diastology LVIDs:         2.60 cm  LV e' medial:   1.96 cm/s LV PW:         1.30 cm  LV E/e' medial: 38.7 LV IVS:        1.20 cm LVOT diam:     2.10 cm LV SV:         76 LV SV Index:   35 LVOT Area:     3.46 cm 3D Volume EF:                         3D EF:        52 %                         LV EDV:       96 ml                         LV ESV:       46 ml                         LV SV:        50 ml RIGHT VENTRICLE RV S prime:     14.00 cm/s TAPSE (M-mode): 5.7 cm LEFT ATRIUM             Index       RIGHT ATRIUM           Index LA diam:        3.30 cm 1.53 cm/m  RA Area:     21.00 cm LA Vol (A2C):   64.2 ml 29.80 ml/m RA Volume:   55.60 ml  25.81 ml/m LA Vol (A4C):   31.2 ml 14.48 ml/m LA Biplane Vol: 45.4 ml 21.07 ml/m  AORTIC VALVE                    PULMONIC VALVE AV Area (Vmax):    2.39 cm     PV Vmax:       0.73 m/s AV Area (Vmean):   2.78 cm  PV Peak Clarke:  2.1 mmHg AV Area (VTI):     2.89 cm AV Vmax:           162.00 cm/s AV Vmean:          108.000 cm/s AV VTI:            0.261 m AV Peak Clarke:       10.5 mmHg AV Mean Clarke:      6.0 mmHg LVOT Vmax:         112.00 cm/s LVOT Vmean:        86.800 cm/s LVOT VTI:          0.218 m LVOT/AV VTI ratio: 0.84  AORTA Ao Root diam: 3.30 cm MITRAL VALVE                TRICUSPID VALVE MV Area (PHT): 3.95 cm     TR Peak Clarke:   30.0 mmHg MV Decel Time: 192 msec     TR Vmax:        274.00 cm/s MV E velocity: 75.80 cm/s MV A velocity: 119.00 cm/s  SHUNTS MV E/A ratio:  0.64         Systemic VTI:  0.22 m                             Systemic Diam: 2.10 cm Kate Sable MD Electronically signed by Kate Sable MD Signature Date/Time: 06/08/2021/3:10:53 PM    Final    IR IMAGING GUIDED PORT INSERTION  Result Date: 06/03/2021 INDICATION: Metastatic lung cancer.  Chemotherapy. EXAM: IMPLANTED PORT A CATH PLACEMENT WITH ULTRASOUND AND FLUOROSCOPIC GUIDANCE MEDICATIONS: None ANESTHESIA/SEDATION: Moderate (conscious) sedation was employed during this procedure. A total of Versed 0 mg and Fentanyl 25 mcg was administered intravenously. Moderate Sedation Time: 20 minutes. The patient's level of consciousness and vital signs were monitored continuously by radiology nursing throughout the procedure under my direct supervision. FLUOROSCOPY TIME:  0 minutes, 24 seconds (4.5 mGy) COMPLICATIONS: None immediate. PROCEDURE: The procedure, risks, benefits, and alternatives were explained to the patient. Questions regarding the procedure were encouraged and answered. The patient understands and consents to the procedure. The neck and chest were prepped with chlorhexidine in a sterile fashion, and a sterile drape was applied covering the operative field. Maximum barrier sterile technique with sterile gowns and gloves were used for the procedure. A timeout was performed prior to the initiation of the procedure. Local anesthesia was provided with 1% lidocaine with epinephrine. After creating a small venotomy incision, a micropuncture kit was utilized to access the internal jugular vein  under direct, real-time ultrasound guidance. Ultrasound image documentation was performed. The microwire was kinked to measure appropriate catheter length. A subcutaneous port pocket was then created along the upper chest wall utilizing a combination of sharp and blunt dissection. The pocket was irrigated with sterile saline. A single lumen ISP power injectable port was chosen for placement. The 8 Fr catheter was tunneled from the port pocket site to the venotomy incision. The port was placed in the pocket. The external catheter was trimmed to appropriate length. At the venotomy, an 8 Fr peel-away sheath was placed over a guidewire under fluoroscopic guidance. The catheter was then placed through the sheath and the sheath was removed. Final catheter positioning was confirmed and documented with a fluoroscopic spot radiograph. The port was accessed with a Huber needle, aspirated and flushed with heparinized saline. The port pocket incision was closed  with interrupted 3-0 Vicryl suture then Dermabond was applied, including at the venotomy incision. Dressings were placed. The patient tolerated the procedure well without immediate post procedural complication. IMPRESSION: Successful placement of a right internal jugular approach power injectable Port-A-Cath. The catheter is ready for immediate use. Michaelle Birks, MD [facility] Electronically Signed   By: Michaelle Birks M.D.   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  Patient received cycle 1 of carboplatinum and Taxol on June 16, 2021.  He received Avastin on June 17, 2021.  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  Patient will follow-up in the clinic on Thursday, July 07, 2021 with clinical pharmacy to initiate oral treatment.  No further intervention is needed from an oncology standpoint.  Appreciate palliative care input.   Aspiration/dysphagia: PEG tube in place.  By report recommendation is to use PEG tube for 100% of nutrition.  He appears to have tolerated his treatments well without significant side effects.  His next treatment will occur on July 07, 2021.  No further intervention is needed from an oncology standpoint.  Appreciate palliative care input. Shortness of breath/COPD exacerbation: Chronic and unchanged.  Patient is at his baseline oxygen requirement of 4 L nasal cannula.     Hypokalemia: Patient will likely require supplementation via PEG upon discharge. Disposition: Possible discharge in the next several days once confirmed patient can handle tube feeds on his own.   Will follow.  Lloyd Huger, MD   07/01/2021 8:00 PM

## 2021-07-02 MED ORDER — HEPARIN SOD (PORK) LOCK FLUSH 100 UNIT/ML IV SOLN
500.0000 [IU] | Freq: Once | INTRAVENOUS | Status: AC
  Administered 2021-07-02: 500 [IU] via INTRAVENOUS
  Filled 2021-07-02: qty 5

## 2021-07-02 NOTE — Progress Notes (Signed)
   07/01/21 2321  Assess: MEWS Score  Temp 98.5 F (36.9 C)  BP (!) 137/97  Pulse Rate (!) 114  Resp 20  Level of Consciousness Alert  SpO2 98 %  O2 Device Nasal Cannula  O2 Flow Rate (L/min) 4 L/min  Assess: MEWS Score  MEWS Temp 0  MEWS Systolic 0  MEWS Pulse 2  MEWS RR 0  MEWS LOC 0  MEWS Score 2  MEWS Score Color Yellow  Assess: if the MEWS score is Yellow or Red  Were vital signs taken at a resting state? Yes  Focused Assessment No change from prior assessment  Does the patient meet 2 or more of the SIRS criteria? No  Does the patient have a confirmed or suspected source of infection? No  MEWS guidelines implemented *See Row Information* No, previously yellow, continue vital signs every 4 hours  Treat  MEWS Interventions Administered scheduled meds/treatments  Pain Scale 0-10  Pain Score 0  Take Vital Signs  Increase Vital Sign Frequency  Yellow: Q 2hr X 2 then Q 4hr X 2, if remains yellow, continue Q 4hrs  Escalate  MEWS: Escalate Yellow: discuss with charge nurse/RN and consider discussing with provider and RRT  Notify: Charge Nurse/RN  Name of Charge Nurse/RN Notified Felicia RN  Date Charge Nurse/RN Notified 07/01/21  Time Charge Nurse/RN Notified 2330  Document  Patient Outcome Stabilized after interventions  Progress note created (see row info) Yes  Assess: SIRS CRITERIA  SIRS Temperature  0  SIRS Pulse 1  SIRS Respirations  0  SIRS WBC 0  SIRS Score Sum  1  Patient has had no change in status.  Will recheck vitals per protocol.

## 2021-07-02 NOTE — Progress Notes (Signed)
Discharge instructions and education presented to pt with teach back/pt verbalized understanding. Tele removed and chest port de-accessed. All supplies at bedside. Will transport off unit via wheelchair when ride arrives.

## 2021-07-02 NOTE — TOC Transition Note (Signed)
Transition of Care [facility]) - CM/SW Discharge Note   Patient Details  Name: Derek Clarke MRN: [id number] Date of Birth: 07/15/1956  [facility] CM/SW Contact:  Izola Price, RN Phone Number: 07/02/2021, 10:33 AM  Clinical Narrative:  07/02/21 G-tube placed 10/4. Patient is to be discharged today. Patient has transportation home. Pt needs PCP and information from [facility] sent to patient, also discussed over the phone resources and how to contact. Medicaid pending. Also discussed with patient Internet resources for exploring disability/starting a Medicare log in and other area resources.   Patient has difficulty speaking  (very softly) so also suggested having someone designated to speak with him over the phone or at appointments. Patient has a CSW at [facility] where patient is followed and recommended patient contact this SW post discharge for assistance with other post discharge issues.  [facility] will follow for tube feedings and 3 days worth delivered to room from hospital source for discharge until [facility] can get to patient. Patient also have chronic oxygen in place PTA with [facility]. There is a tank available for transportation home per Unit RN.  Communicated with Unit RN and Provider.  Simmie Davies RN CM     Final next level of care: Home/Self Care Barriers to Discharge: Barriers Resolved   Patient Goals and CMS Choice Patient states their goals for this hospitalization and ongoing recovery are:: home with self care CMS Medicare.gov Compare Post Acute Care list provided to:: Patient Choice offered to / list presented to : Patient  Discharge Placement                       Discharge Plan and Services                DME Arranged:  (Has DME oxygen PTA chronic at home via [employer] which will also provider tube feedings post discharge.) DME Agency: [facility] Date DME Agency Contacted: 07/01/21   Representative spoke with at DME Agency: Bethanne Ginger per prior CM  notes. HH Arranged: NA HH Agency: NA        Social Determinants of Health (SDOH) Interventions     Readmission Risk Interventions No flowsheet data found.

## 2021-07-02 NOTE — Discharge Summary (Signed)
Physician Discharge Summary  Patient ID: Derek Clarke MRN: [id number] DOB/AGE: 01/12/1956 65 y.o.  Admit date: 06/13/2021 Discharge date: 07/02/2021  Admission Diagnoses:  Discharge Diagnoses:  Principal Problem:   Acute on chronic respiratory failure with hypoxia ([place]) Active Problems:   Adenocarcinoma of lung, stage 4 (HCC)   COPD exacerbation (HCC)   SOB (shortness of breath)   Elevated troponin   Dysphagia   GAD (generalized anxiety disorder)   Moderate malnutrition (HCC)   Chronic diastolic CHF (congestive heart failure) (HCC)   Thrombocytopenia (HCC)   Hypokalemia   Hyponatremia   Discharged Condition: good  Hospital Course:   66 year old man with past medical history of adenocarcinoma of the lung stage IV with mets to liver and bone.,  Chronic hypoxic respiratory failure on 4 L, COPD, anxiety.  He was admitted 14 days ago and treated for acute on chronic hypoxic respiratory failure, postobstructive pneumonia and COPD exacerbation.  The patient's also had dysphagia and was having aspiration and it was recommended for PEG tube placement.  PEG tube placed on 10/4.   Dysphagia. Patient has G-tube placed, x-ray showed free air.  Discussed with radiology, there is no concern for small amount of free air.  Repeated KUB free air is decreasing in amount. Tube feeding was started yesterday, patient tolerating well.  Nursing has also taught patient how to feeding himself.  He is advised to continue strict n.p.o., tube feeding only. Social worker has set up feeding material, PT/OT eval the patient, patient does not need nursing home from their standpoint.  At this point, patient is medically stable to be discharged.   Acute on chronic hypoxemic respite failure. COPD exacerbation. Lung cancer. Postobstructive pneumonia. Continue steroid taper, oxygenation appears to baseline. Antibiotics are completed.   Stage IV adenocarcinoma of the lung with metastasis to liver and  bone. Thrombocytopenia secondary to chemo. Leukopenia secondary to chemotherapy. Patient will be continued to be followed by oncology as outpatient.  Chronic diastolic congestive heart failure  I have placed patient back on Lasix and potassium at time of discharge.  Patient has 1+ leg edema.     Constipation. Patient finally agreeable to lactulose, he had a small bowel movement this morning.  I have prescribed lactulose and MiraLAX as needed.  I also asked the nurse to give patient an enema before discharge.  Patient was not able to discharge, nurse was concerned about patient may not be reliably feeding himself.  We will monitor him yesterday, I spoke with the nurse again, he was able to feed himself the whole day yesterday without any confusion or problems.  At this point, he is medically stable to be discharged.  I have asked social worker to set up primary care physician as outpatient. Patient long-term prognosis is poor, he will need palliative care   Consults: None  Significant Diagnostic Studies:   Treatments: antibiotics, PEG tube  Discharge Exam: Blood pressure (!) 139/95, pulse 100, temperature (!) 97.4 F (36.3 C), resp. rate 18, height 5\' 9"  (1.753 m), weight 90.8 kg, SpO2 100 %. General appearance: alert and cooperative Resp: Decreased breathing sounds without wheezes or crackles. Cardio: regular rate and rhythm, S1, S2 normal, no murmur, click, rub or gallop GI: soft, non-tender; bowel sounds normal; no masses,  no organomegaly Extremities: extremities normal, atraumatic, no cyanosis or edema and 1+ leg edema  Disposition: Discharge disposition: 01-Home or Self Care       Discharge Instructions     Diet general   Complete by:  As directed    Strictly NPO, tube feeding only   Increase activity slowly   Complete by: As directed       Allergies as of 07/02/2021   No Known Allergies      Medication List     STOP taking these medications     chlorpheniramine 4 MG tablet Commonly known as: CHLOR-TRIMETON   diphenhydrAMINE 25 MG tablet Commonly known as: BENADRYL   guaiFENesin 600 MG 12 hr tablet Commonly known as: MUCINEX Replaced by: guaiFENesin 100 MG/5ML Soln   ibuprofen 400 MG tablet Commonly known as: ADVIL   prochlorperazine 10 MG tablet Commonly known as: COMPAZINE       TAKE these medications    albuterol 108 (90 Base) MCG/ACT inhaler Commonly known as: VENTOLIN HFA Inhale 2 puffs into the lungs every 6 (six) hours as needed for wheezing or shortness of breath.   ALPRAZolam 0.5 MG tablet Commonly known as: XANAX Place 1 tablet (0.5 mg total) into feeding tube 2 (two) times daily as needed for anxiety. What changed: how to take this   busPIRone 5 MG tablet Commonly known as: BUSPAR Take 1 tablet (5 mg total) by mouth 3 (three) times daily.   feeding supplement (OSMOLITE 1.5 CAL) Liqd Place 237 mLs into feeding tube 6 (six) times daily. What changed:  how to take this when to take this   feeding supplement (PROSource TF) liquid Place 45 mLs into feeding tube 3 (three) times daily. What changed: You were already taking a medication with the same name, and this prescription was added. Make sure you understand how and when to take each.   fluticasone furoate-vilanterol 100-25 MCG/INH Aepb Commonly known as: Breo Ellipta Inhale 1 puff into the lungs daily.   free water Soln Place 120 mLs into feeding tube 6 (six) times daily.   furosemide 40 MG tablet Commonly known as: LASIX Place 1 tablet (40 mg total) into feeding tube daily.   guaiFENesin 100 MG/5ML Soln Commonly known as: ROBITUSSIN Place 10 mLs (200 mg total) into feeding tube every 4 (four) hours as needed for cough or to loosen phlegm. Replaces: guaiFENesin 600 MG 12 hr tablet   lactulose 10 GM/15ML solution Commonly known as: CHRONULAC Place 30 mLs (20 g total) into feeding tube 2 (two) times daily as needed for mild  constipation.   multivitamin with minerals Tabs tablet Place 1 tablet into feeding tube daily. What changed: how to take this   oxyCODONE 5 MG immediate release tablet Commonly known as: Oxy IR/ROXICODONE Place 1 tablet (5 mg total) into feeding tube every 4 (four) hours as needed for moderate pain.   polyethylene glycol 17 g packet Commonly known as: MiraLax Place 17 g into feeding tube daily.   potassium chloride 20 MEQ packet Commonly known as: KLOR-CON Place 20 mEq into feeding tube daily.   predniSONE 20 MG tablet Commonly known as: DELTASONE Place 1.5 tablets (30 mg total) into feeding tube daily with breakfast for 2 days, THEN 1 tablet (20 mg total) daily with breakfast for 3 days, THEN 0.5 tablets (10 mg total) daily with breakfast for 3 days. Start taking on: July 02, 2021               Durable Medical Equipment  (From admission, onward)           Start     Ordered   06/19/21 5409  For home use only DME 3 n 1  Once  06/19/21 0721   06/19/21 0722  For home use only DME Shower stool  Once        06/19/21 5834            Follow-up Information     Tyler Pita, MD Follow up on 07/22/2021.   Specialty: Pulmonary Disease Why: @ 11:30am Contact information: [address] [id number] [phone]                 Signed: Sharen Hones 07/02/2021, 9:06 AM

## 2021-07-06 ENCOUNTER — Other Ambulatory Visit: Payer: Non-veteran care

## 2021-07-07 ENCOUNTER — Inpatient Hospital Stay: Payer: Self-pay

## 2021-07-07 ENCOUNTER — Other Ambulatory Visit: Payer: Self-pay

## 2021-07-07 ENCOUNTER — Encounter: Payer: Self-pay | Admitting: *Deleted

## 2021-07-07 ENCOUNTER — Inpatient Hospital Stay: Payer: Self-pay | Admitting: *Deleted

## 2021-07-07 ENCOUNTER — Inpatient Hospital Stay: Payer: Self-pay | Admitting: Pharmacist

## 2021-07-07 ENCOUNTER — Inpatient Hospital Stay (HOSPITAL_BASED_OUTPATIENT_CLINIC_OR_DEPARTMENT_OTHER): Payer: Self-pay | Admitting: Hospice and Palliative Medicine

## 2021-07-07 ENCOUNTER — Inpatient Hospital Stay: Payer: Self-pay | Attending: Oncology

## 2021-07-07 ENCOUNTER — Encounter: Payer: Self-pay | Admitting: Pharmacist

## 2021-07-07 VITALS — BP 93/62 | HR 111 | Temp 96.0°F | Resp 24 | Wt 195.9 lb

## 2021-07-07 DIAGNOSIS — C3492 Malignant neoplasm of unspecified part of left bronchus or lung: Secondary | ICD-10-CM

## 2021-07-07 DIAGNOSIS — I5030 Unspecified diastolic (congestive) heart failure: Secondary | ICD-10-CM

## 2021-07-07 DIAGNOSIS — C7951 Secondary malignant neoplasm of bone: Secondary | ICD-10-CM | POA: Insufficient documentation

## 2021-07-07 DIAGNOSIS — I959 Hypotension, unspecified: Secondary | ICD-10-CM

## 2021-07-07 DIAGNOSIS — C3490 Malignant neoplasm of unspecified part of unspecified bronchus or lung: Secondary | ICD-10-CM

## 2021-07-07 DIAGNOSIS — C787 Secondary malignant neoplasm of liver and intrahepatic bile duct: Secondary | ICD-10-CM | POA: Insufficient documentation

## 2021-07-07 DIAGNOSIS — E860 Dehydration: Secondary | ICD-10-CM | POA: Insufficient documentation

## 2021-07-07 LAB — CBC WITH DIFFERENTIAL/PLATELET
Abs Immature Granulocytes: 0.28 10*3/uL — ABNORMAL HIGH (ref 0.00–0.07)
Basophils Absolute: 0 10*3/uL (ref 0.0–0.1)
Basophils Relative: 0 %
Eosinophils Absolute: 0 10*3/uL (ref 0.0–0.5)
Eosinophils Relative: 0 %
HCT: 39.9 % (ref 39.0–52.0)
Hemoglobin: 13.4 g/dL (ref 13.0–17.0)
Immature Granulocytes: 4 %
Lymphocytes Relative: 16 %
Lymphs Abs: 1.3 10*3/uL (ref 0.7–4.0)
MCH: 27.9 pg (ref 26.0–34.0)
MCHC: 33.6 g/dL (ref 30.0–36.0)
MCV: 83.1 fL (ref 80.0–100.0)
Monocytes Absolute: 1.1 10*3/uL — ABNORMAL HIGH (ref 0.1–1.0)
Monocytes Relative: 14 %
Neutro Abs: 5.1 10*3/uL (ref 1.7–7.7)
Neutrophils Relative %: 66 %
Platelets: 268 10*3/uL (ref 150–400)
RBC: 4.8 MIL/uL (ref 4.22–5.81)
RDW: 13.6 % (ref 11.5–15.5)
WBC: 7.8 10*3/uL (ref 4.0–10.5)
nRBC: 0 % (ref 0.0–0.2)

## 2021-07-07 LAB — COMPREHENSIVE METABOLIC PANEL
ALT: 44 U/L (ref 0–44)
AST: 28 U/L (ref 15–41)
Albumin: 3.4 g/dL — ABNORMAL LOW (ref 3.5–5.0)
Alkaline Phosphatase: 106 U/L (ref 38–126)
Anion gap: 9 (ref 5–15)
BUN: 24 mg/dL — ABNORMAL HIGH (ref 8–23)
CO2: 30 mmol/L (ref 22–32)
Calcium: 9 mg/dL (ref 8.9–10.3)
Chloride: 97 mmol/L — ABNORMAL LOW (ref 98–111)
Creatinine, Ser: 0.7 mg/dL (ref 0.61–1.24)
GFR, Estimated: >60 mL/min (ref >60)
Glucose, Bld: 100 mg/dL — ABNORMAL HIGH (ref 70–99)
Potassium: 3.7 mmol/L (ref 3.5–5.1)
Sodium: 136 mmol/L (ref 135–145)
Total Bilirubin: 0.7 mg/dL (ref 0.3–1.2)
Total Protein: 6.3 g/dL — ABNORMAL LOW (ref 6.5–8.1)

## 2021-07-07 MED ORDER — OSIMERTINIB MESYLATE 80 MG PO TABS: 80.0000 mg | ORAL_TABLET | Freq: Every day | ORAL | 2 refills | Status: AC

## 2021-07-07 MED ORDER — SODIUM CHLORIDE 0.9% FLUSH
10.0000 mL | INTRAVENOUS | Status: DC | PRN
  Administered 2021-07-07: 10 mL via INTRAVENOUS
  Filled 2021-07-07: qty 10

## 2021-07-07 MED ORDER — HEPARIN SOD (PORK) LOCK FLUSH 100 UNIT/ML IV SOLN
500.0000 [IU] | Freq: Once | INTRAVENOUS | Status: AC
  Administered 2021-07-07: 500 [IU] via INTRAVENOUS
  Filled 2021-07-07: qty 5

## 2021-07-07 MED ORDER — SODIUM CHLORIDE 0.9 % IV SOLN
Freq: Once | INTRAVENOUS | Status: AC
  Filled 2021-07-07: qty 250

## 2021-07-07 NOTE — Progress Notes (Signed)
Pt to receive approximately 375-400 ml NS via port for hypotension and mild dehydration. Pt will discharge to home via uber. Pt was able to call his brother to come pick him up at cancer center.

## 2021-07-07 NOTE — Progress Notes (Signed)
[place] Work  Clinical Social Work previously made referral to [employer].  [place] financial counselor assisted patient in applying for Medicaid and providing information on how to apply for VA benefits.  [place] financial counselor notified CSW, [place] caseworker needs additional information from patient.    CSW met with patient to assist in retrieving additional information.  The patient does not have information needed and is unsure how he can obtain it.  The patient shared it has been difficult to work on obtaining assistance due to physical distress and feeling overwhelmed. CSW validated patients feelings.  CSW agreed to meet with patient next week to contact VA together and assist patient "step by step".  Mr. Baldus was appreciative and agreed to follow up visit.      Gwinda Maine, LCSW  Clinical Social Worker [facility]

## 2021-07-07 NOTE — Progress Notes (Signed)
Nutrition Assessment   Reason for Assessment:   New PEG tube   ASSESSMENT:  65 year old male with stage IV adenocarcinoma of lung with liver mets.  Past medical history of COPD, CHF.  PEG placed during recent hospitalization.  Met with patient as add on to clinic today.  Patient hard to get straight answer from.  Finally says that he gave a tube feeding at 2 am this morning but none since because he didn't think he could have food when he came for appointment.  Says that he has not gotten into a routine of giving tube feeding as just got home from hospital (discharged on 10/8).  Says he got a bottle of what he thinks is the protein liquid but has not been using it.  Says that he has boxes of formula at home and syringes that was delivered.   Noted patient to be NPO from discharge note   Medications: reviewed   Labs: reviewed   Anthropometrics:   Height: 69 inches Weight: 195 lb 14.4 oz 200 lb 2.8 oz on 10/8 (hospital) BMI: 28   Estimated Energy Needs  Kcals: 2225-2670 Protein: 110-130 g Fluid: 2.2 L   NUTRITION DIAGNOSIS: Inadequate oral intake related to cancer as evidenced by PEG placed   INTERVENTION:  RD main focus this afternoon was for patient to demonstrate ability to use feeding tube.  Patient did not want to show RD how to use feeding tube.  Patient anxious about demonstration.  Offered reassurance.  Patient able to successfully give 60 cc water flush before and after and 1 full carton of osmolite 1.5 today in clinic over 15 minutes.  Agreed that nutrition is important.  Patient says he is a night owl and agreed to give feeding at noon, 4pm, 8pm and midnight.  Will give 1 1/2 carton at each feeding with 60 ml water flush before and after. Additional hydration of 240 ml BID between feeding at 2pm and 6pm.  RD wrote all instructions down for patient. Patient was able to repeat back instructions.  Tube feeding provides 2130 calories, 89.4 g, 2040 ml free water.   Will  not get patient to start protein at this time as needs to successfully get comfortable with feeding.  Patient verbalized feeling anxious especially about coming to clinic.  Active listening provided.  Patient has no one at home to help.  Will discuss with LCSW.   Contact information given.  Patient had to get finished up and catch uber home from clinic.  Further nutrition evaluation to be completed at next visit.     MONITORING, EVALUATION, GOAL: weight trends, tube feeding   Next Visit: Thursday, 10/20  Gilmer Kaminsky B. Zenia Resides, [place], [place] Registered Dietitian [phone] (mobile)

## 2021-07-07 NOTE — Patient Instructions (Signed)
Dehydration, Adult Dehydration is condition in which there is not enough water or other fluids in the body. This happens when a person loses more fluids than he or she takes in. Important body parts cannot work right without the right amount of fluids. Any loss of fluids from the body can cause dehydration. Dehydration can be mild, worse, or very bad. It should be treated right away to keep it from getting very bad. What are the causes? This condition may be caused by: Conditions that cause loss of water or other fluids, such as: Watery poop (diarrhea). Vomiting. Sweating a lot. Peeing (urinating) a lot. Not drinking enough fluids, especially when you: Are ill. Are doing things that take a lot of energy to do. Other illnesses and conditions, such as fever or infection. Certain medicines, such as medicines that take extra fluid out of the body (diuretics). Lack of safe drinking water. Not being able to get enough water and food. What increases the risk? The following factors may make you more likely to develop this condition: Having a long-term (chronic) illness that has not been treated the right way, such as: Diabetes. Heart disease. Kidney disease. Being 14 years of age or older. Having a disability. Living in a place that is high above the ground or sea (high in altitude). The thinner, dried air causes more fluid loss. Doing exercises that put stress on your body for a long time. What are the signs or symptoms? Symptoms of dehydration depend on how bad it is. Mild or worse dehydration Thirst. Dry lips or dry mouth. Feeling dizzy or light-headed, especially when you stand up from sitting. Muscle cramps. Your body making: Dark pee (urine). Pee may be the color of tea. Less pee than normal. Less tears than normal. Headache. Very bad dehydration Changes in skin. Skin may: Be cold to the touch (clammy). Be blotchy or pale. Not go back to normal right after you lightly pinch  it and let it go. Little or no tears, pee, or sweat. Changes in vital signs, such as: Fast breathing. Low blood pressure. Weak pulse. Pulse that is more than 100 beats a minute when you are sitting still. Other changes, such as: Feeling very thirsty. Eyes that look hollow (sunken). Cold hands and feet. Being mixed up (confused). Being very tired (lethargic) or having trouble waking from sleep. Short-term weight loss. Loss of consciousness. How is this treated? Treatment for this condition depends on how bad it is. Treatment should start right away. Do not wait until your condition gets very bad. Very bad dehydration is an emergency. You will need to go to a hospital. Mild or worse dehydration can be treated at home. You may be asked to: Drink more fluids. Drink an oral rehydration solution (ORS). This drink helps get the right amounts of fluids and salts and minerals in the blood (electrolytes). Very bad dehydration can be treated: With fluids through an IV tube. By getting normal levels of salts and minerals in your blood. This is often done by giving salts and minerals through a tube. The tube is passed through your nose and into your stomach. By treating the root cause. Follow these instructions at home: Oral rehydration solution If told by your doctor, drink an ORS: Make an ORS. Use instructions on the package. Start by drinking small amounts, about  cup (120 mL) every 5-10 minutes. Slowly drink more until you have had the amount that your doctor said to have. Eating and drinking  Drink enough clear fluid to keep your pee pale yellow. If you were told to drink an ORS, finish the ORS first. Then, start slowly drinking other clear fluids. Drink fluids such as: Water. Do not drink only water. Doing that can make the salt (sodium) level in your body get too low. Water from ice chips you suck on. Fruit juice that you have added water to (diluted). Low-calorie sports  drinks. Eat foods that have the right amounts of salts and minerals, such as: Bananas. Oranges. Potatoes. Tomatoes. Spinach. Do not drink alcohol. Avoid: Drinks that have a lot of sugar. These include: High-calorie sports drinks. Fruit juice that you did not add water to. Soda. Caffeine. Foods that are greasy or have a lot of fat or sugar. General instructions Take over-the-counter and prescription medicines only as told by your doctor. Do not take salt tablets. Doing that can make the salt level in your body get too high. Return to your normal activities as told by your doctor. Ask your doctor what activities are safe for you. Keep all follow-up visits as told by your doctor. This is important. Contact a doctor if: You have pain in your belly (abdomen) and the pain: Gets worse. Stays in one place. You have a rash. You have a stiff neck. You get angry or annoyed (irritable) more easily than normal. You are more tired or have a harder time waking than normal. You feel: Weak or dizzy. Very thirsty. Get help right away if you have: Any symptoms of very bad dehydration. Symptoms of vomiting, such as: You cannot eat or drink without vomiting. Your vomiting gets worse or does not go away. Your vomit has blood or green stuff in it. Symptoms that get worse with treatment. A fever. A very bad headache. Problems with peeing or pooping (having a bowel movement), such as: Watery poop that gets worse or does not go away. Blood in your poop (stool). This may cause poop to look black and tarry. Not peeing in 6-8 hours. Peeing only a small amount of very dark pee in 6-8 hours. Trouble breathing. These symptoms may be an emergency. Do not wait to see if the symptoms will go away. Get medical help right away. Call your local emergency services (911 in the [place] Do not drive yourself to the hospital. Summary Dehydration is a condition in which there is not enough water or other fluids  in the body. This happens when a person loses more fluids than he or she takes in. Treatment for this condition depends on how bad it is. Treatment should be started right away. Do not wait until your condition gets very bad. Drink enough clear fluid to keep your pee pale yellow. If you were told to drink an oral rehydration solution (ORS), finish the ORS first. Then, start slowly drinking other clear fluids. Take over-the-counter and prescription medicines only as told by your doctor. Get help right away if you have any symptoms of very bad dehydration. This information is not intended to replace advice given to you by your health care provider. Make sure you discuss any questions you have with your health care provider. Document Revised: 04/24/2019 Document Reviewed: 04/24/2019 [employer] Patient Education  [employer].

## 2021-07-07 NOTE — Progress Notes (Signed)
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  His most recent hospitalization was complicated by aspiration pneumonia and poor oral intake necessitating PEG placement.  Patient presented to clinic today to meet with Pharm.D. regarding initiation of oral chemotherapy.  He was found to be hypotensive and so was an add-on to [facility] for evaluation.  Patient says that he feels better since discharging her from the hospital.  He denies any acute complaints today.  He does endorse feeling dizzy with position changes.  He is supposed to be using his PEG for nutritional supplements 237 mL 6 times daily but says that he is only doing that at most 3 times daily.  He has little to no oral intake at this point.  Patient denies shortness of breath or chest pain.  No fever or chills.  No GI or GU symptoms.  Patient has chronic lower extremity edema but says that this is unchanged.  Denies any neurologic complaints. Denies recent fevers or illnesses. Denies any easy bleeding or bruising.  Denies chest pain. Denies any nausea, vomiting, constipation, or diarrhea. Denies urinary complaints. Patient offers no further specific complaints today.    PAST MEDICAL HISTORY: Past Medical History:  Diagnosis Date   Adenocarcinoma ([place])    lung; stage 4 with mets to liver    Chronic respiratory failure with hypoxia (HCC)    baseline 4 L [place]   COPD (chronic obstructive pulmonary disease) (HCC)    GAD (generalized anxiety disorder)     PAST SURGICAL HISTORY:  Past Surgical History:  Procedure Laterality Date   IR GASTROSTOMY TUBE MOD SED  06/28/2021   IR IMAGING GUIDED PORT INSERTION  06/03/2021    HEMATOLOGY/ONCOLOGY HISTORY:  Oncology History  Adenocarcinoma of left lung ([place])  05/26/2021 Initial Diagnosis   Adenocarcinoma of left lung ([place])   05/27/2021 Cancer Staging   Staging form: Lung, AJCC 8th Edition - Clinical stage from 05/27/2021: Stage IVB (cTX, cN3, pM1c) - Signed by Lloyd Huger, MD on 05/27/2021   06/16/2021 -  Chemotherapy    Patient is on Treatment Plan: LUNG NSCLC CARBOPLATIN + PACLITAXEL + BEVACIZUMAB Q21D X 1 CYCLE         ALLERGIES:  has No Known Allergies.  MEDICATIONS:  Current Outpatient Medications  Medication Sig Dispense Refill   albuterol (VENTOLIN HFA) 108 (90 Base) MCG/ACT inhaler Inhale 2 puffs into the lungs every 6 (six) hours as needed for wheezing or shortness of breath. 8 g 2   ALPRAZolam (XANAX) 0.5 MG tablet Place 1 tablet (0.5 mg total) into feeding tube 2 (two) times daily as needed for anxiety. 60 tablet 0   busPIRone (BUSPAR) 5 MG tablet Take 1 tablet (5 mg total) by mouth 3 (three) times daily. 90 tablet 1   fluticasone furoate-vilanterol (BREO ELLIPTA) 100-25 MCG/INH AEPB Inhale 1 puff into the lungs daily. 1 each 1   furosemide (LASIX) 40  MG tablet Place 1 tablet (40 mg total) into feeding tube daily. 30 tablet 0   guaiFENesin (ROBITUSSIN) 100 MG/5ML SOLN Place 10 mLs (200 mg total) into feeding tube every 4 (four) hours as needed for cough or to loosen phlegm. 236 mL 0   lactulose (CHRONULAC) 10 GM/15ML solution Place 30 mLs (20 g total) into feeding tube 2 (two) times daily as needed for mild constipation. 236 mL 0   Multiple Vitamin (MULTIVITAMIN WITH MINERALS) TABS tablet Place 1 tablet into feeding  tube daily. 90 tablet 0   Nutritional Supplements (FEEDING SUPPLEMENT, OSMOLITE 1.5 CAL,) LIQD Place 237 mLs into feeding tube 6 (six) times daily.  0   Nutritional Supplements (FEEDING SUPPLEMENT, PROSOURCE TF,) liquid Place 45 mLs into feeding tube 3 (three) times daily.     oxyCODONE (OXY IR/ROXICODONE) 5 MG immediate release tablet Place 1 tablet (5 mg total) into feeding tube every 4 (four) hours as needed for moderate pain. 10 tablet 0   polyethylene glycol (MIRALAX) 17 g packet Place 17 g into feeding tube daily. 14 each 0   potassium chloride (KLOR-CON) 20 MEQ packet Place 20 mEq into feeding tube daily. 30 packet 0   predniSONE (DELTASONE) 20 MG tablet Place 1.5 tablets (30 mg total) into feeding tube daily with breakfast for 2 days, THEN 1 tablet (20 mg total) daily with breakfast for 3 days, THEN 0.5 tablets (10 mg total) daily with breakfast for 3 days. 7.5 tablet 0   Water For Irrigation, Sterile (FREE WATER) SOLN Place 120 mLs into feeding tube 6 (six) times daily.     No current facility-administered medications for this visit.   Facility-Administered Medications Ordered in Other Visits  Medication Dose Route Frequency Provider Last Rate Last Admin   0.9 %  sodium chloride infusion   Intravenous Once Tremaine Earwood, Kirt Boys, NP 375 mL/hr at 07/07/21 1455 New Bag at 07/07/21 1455   heparin lock flush 100 unit/mL  500 Units Intravenous Once Cindel Daugherty, Vonna Kotyk R, NP       sodium chloride flush (NS) 0.9 % injection 10 mL  10 mL Intravenous PRN Urijah Raynor, Kirt Boys, NP   10 mL at 07/07/21 1445    VITAL SIGNS: There were no vitals taken for this visit. There were no vitals filed for this visit.  Estimated body mass index is 28.93 kg/m as calculated from the following:   Height as of 06/15/21: 5\' 9"  (1.753 m).   Weight as of an earlier encounter on 07/07/21: 195 lb 14.4 oz (88.9 kg).  LABS: CBC:    Component Value Date/Time   WBC 7.8 07/07/2021 1305   HGB 13.4 07/07/2021 1305   HCT 39.9  07/07/2021 1305   PLT 268 07/07/2021 1305   MCV 83.1 07/07/2021 1305   NEUTROABS 5.1 07/07/2021 1305   LYMPHSABS 1.3 07/07/2021 1305   MONOABS 1.1 (H) 07/07/2021 1305   EOSABS 0.0 07/07/2021 1305   BASOSABS 0.0 07/07/2021 1305   Comprehensive Metabolic Panel:    Component Value Date/Time   NA 136 07/07/2021 1305   K 3.7 07/07/2021 1305   CL 97 (L) 07/07/2021 1305   CO2 30 07/07/2021 1305   BUN 24 (H) 07/07/2021 1305   CREATININE 0.70 07/07/2021 1305   GLUCOSE 100 (H) 07/07/2021 1305   CALCIUM 9.0 07/07/2021 1305   AST 28 07/07/2021 1305   ALT 44 07/07/2021 1305   ALKPHOS 106 07/07/2021 1305   BILITOT 0.7 07/07/2021 1305   PROT 6.3 (L) 07/07/2021 1305  ALBUMIN 3.4 (L) 07/07/2021 1305    RADIOGRAPHIC STUDIES: DG Chest 2 View  Result Date: 06/23/2021 CLINICAL DATA:  COPD exacerbation EXAM: CHEST - 2 VIEW COMPARISON:  06/14/2021 FINDINGS: Frontal and lateral views of the chest demonstrates stable right chest wall port. The cardiac silhouette is unremarkable. Dense left upper and right middle lobe consolidation again noted. Chronic vascular congestion. No effusion or pneumothorax. IMPRESSION: 1. Persistent left upper and right middle lobe consolidation consistent with postobstructive changes based on hilar masses seen on previous CT. 2. Chronic vascular congestion. Electronically Signed   By: Randa Ngo M.D.   On: 06/23/2021 20:19   DG Abd 1 View  Result Date: 06/30/2021 CLINICAL DATA:  History of adenocarcinoma EXAM: ABDOMEN - 1 VIEW COMPARISON:  X-ray abdomen dated June 29, 2021 FINDINGS: Free intraperitoneal air, decreased when compared with prior radiograph. Nonobstructive bowel gas pattern. Moderate stool burden in the right colon. Gastrostomy tube in place. New heterogeneous opacities of the left lower lung. IMPRESSION: Decreased free intraperitoneal air, likely postprocedural and related to gastrostomy tube placement. New heterogeneous opacities of the left lower lung,  concerning for infection or aspiration. Recommend dedicated chest x-ray for better evaluation. Electronically Signed   By: Yetta Glassman M.D.   On: 06/30/2021 10:56   DG Abd 1 View  Result Date: 06/29/2021 CLINICAL DATA:  Gastrostomy tube complication EXAM: ABDOMEN - 1 VIEW COMPARISON:  06/28/2021 FINDINGS: Lung bases clear. Free air again identified under the LEFT hemidiaphragm. Gastrostomy tube LEFT upper quadrant. Stool in colon with small amount of contrast in small bowel loop mid abdomens in LEFT abdomen. Few scattered sclerotic foci in the pelvis and proximal LEFT femur, patient with known osseous metastases. IMPRESSION: Persistent free intraperitoneal air under the LEFT hemidiaphragm. Electronically Signed   By: Lavonia Dana M.D.   On: 06/29/2021 17:52   DG Abd 1 View  Result Date: 06/28/2021 CLINICAL DATA:  Abdomen pain recent procedure EXAM: ABDOMEN - 1 VIEW COMPARISON:  06/28/2021, 06/23/2021 FINDINGS: Trace free air beneath the right diaphragm with small moderate volume free air beneath the left diaphragm. Patient is status post percutaneous gastrostomy. There is a nonobstructed gas pattern. IMPRESSION: Small moderate free air within the abdomen, potentially related to recent percutaneous gastrostomy tube placement. Consider serial radiographic follow-up to ensure resolution/decreasing free air. These results will be called to the ordering clinician or representative by the Radiologist Assistant, and communication documented in the PACS or [employer]. Electronically Signed   By: Donavan Foil M.D.   On: 06/28/2021 22:51   IR GASTROSTOMY TUBE MOD SED  Result Date: 06/28/2021 INDICATION: Cancer, dysphagia with malnutrition EXAM: Placement of percutaneous gastrostomy tube using fluoroscopic guidance MEDICATIONS: Glucagon 1 mg IV ANESTHESIA/SEDATION: Sedation provided by the Department of Anesthesiology. Refer to separate anesthesia records. CONTRAST:  20 mL-administered into the gastric  lumen. FLUOROSCOPY TIME:  Fluoroscopy Time: 2 minutes 30 seconds (35 mGy). COMPLICATIONS: None immediate. PROCEDURE: Informed written consent was obtained from the patient after a thorough discussion of the procedural risks, benefits and alternatives. All questions were addressed. Maximal Sterile Barrier Technique was utilized including caps, mask, sterile gowns, sterile gloves, sterile drape, hand hygiene and skin antiseptic. A timeout was performed prior to the initiation of the procedure. Review of pre-procedure CT scan demonstrates an adequate window for percutaneous placement of a gastrostomy tube. The patient was placed supine on the exam table. An angled 5 French catheter was passed through the nares into the stomach by the anesthesia team. It was secured to  the nose, and the stomach was insufflated with air. The abdomen was prepped and draped in the standard sterile fashion. After insufflating the stomach with air, puncture sites were selected and local analgesia was obtained with 1% lidocaine. Using fluoroscopic guidance, a gastropexy needle was advanced into the stomach and the T-bar suture was released. Entry into the stomach was confirmed with fluoroscopy, aspiration of air, and injection of contrast material. This was repeated with an additional gastropexy suture (for a total of 3 fasteners). At the center of these gastropexy sutures, a dermatotomy was performed. An 18 gauge needle was then passed into the stomach, and position within the gastric lumen again confirmed under fluoroscopy using aspiration of air and injection of contrast material. An Amplatz guidewire was passed through this needle and intraluminal placement was confirmed with fluoroscopy. The needle was removed, and over the guidewire, a 20 French balloon gastrostomy tube with a coaxial 10 mm balloon was advanced into the percutaneous tract. Dilation of the percutaneous tract was then performed using the balloon, followed by advancement  of the gastrostomy tube into the gastric lumen. The retention balloon was then inflated with 19 mL of sterile water, and the tube was brought back to the gastric wall. The wire and balloon were removed. The external bumper was brought to the skin. Location of the gastrostomy tube within the stomach was then confirmed with injection of contrast material, opacifying the gastric lumen. The gastrostomy tube was flushed with sterile water, and secured to the skin using a dressing. The patient tolerated the procedure well without immediate complication, and was transferred to recovery in stable condition. IMPRESSION: Successful placement of a 20 French percutaneous gastrostomy tube using fluoroscopic guidance. Electronically Signed   By: Albin Felling M.D.   On: 06/28/2021 14:42   NM PET Image Initial (PI) Skull Base To Thigh  Result Date: 06/14/2021 CLINICAL DATA:  Initial treatment strategy for lung nodule. Lymphadenopathy with liver lesions. EXAM: NUCLEAR MEDICINE PET SKULL BASE TO THIGH TECHNIQUE: 12.0 mCi F-18 FDG was injected intravenously. Full-ring PET imaging was performed from the skull base to thigh after the radiotracer. CT data was obtained and used for attenuation correction and anatomic localization. Fasting blood glucose: 122 mg/dl COMPARISON:  CTA Chest 06/07/2021 FINDINGS: Mediastinal blood pool activity: SUV max 0.8 Liver activity: SUV max NA NECK: 12 mm left cervical node on 13/0 is hypermetabolic with SUV max = 4.3. Small hypermetabolic lymph nodes evident right lower neck. Bilateral supraclavicular hypermetabolic lymph nodes including 11 mm left supraclavicular node on 60/3 with SUV max = 7.4. Markedly Markedly hypermetabolic left thyroid nodule measures 1.4 cm on image 60/3 with SUV max = 21.7. Incidental CT findings: none CHEST: Hypermetabolic mediastinal lymphadenopathy evident. Index subcarinal measures 18 mm short axis on 01/02/3 with SUV max = 7.0 hypermetabolic lymph nodes are seen in  both hilar regions into the prevascular space. 16 mm prevascular node on 87/3 demonstrates SUV max = 7.0. Marked wall thickening noted in the mid esophagus, at approximately the level of the carina. Although somewhat difficult to assess given the adjacent hypermetabolic lymphadenopathy, there does appear to be marked hypermetabolism in the wall of this abnormal appearing esophagus with SUV max = 7.7. Areas of collapse/consolidation in the left upper lobe, right middle lobe and infrahilar left lower lobe show substantial hypermetabolism. There is diffuse interstitial opacity in both lungs with innumerable tiny bilateral pulmonary nodules. Incidental CT findings: Trace pericardial effusion. Right Port-A-Cath tip is positioned in the distal SVC. Coronary artery calcification  is evident. Mild atherosclerotic calcification is noted in the wall of the thoracic aorta. ABDOMEN/PELVIS: Multiple hypermetabolic liver lesions evident. 18 mm subcapsular lesion anterior left liver on 150/3 demonstrates SUV max = 7.7. Hypermetabolic gastrohepatic ligament lymph nodes measure up to 14 mm on 150/3 with SUV max = 6.6 12 mm left para-aortic node on 998/3 is hypermetabolic with SUV max = 4.4. 2.4 cm right adrenal nodule shows low attenuation, suggesting adenoma with associated hypermetabolism SUV max = 5.3. Incidental CT findings: Prostate gland is enlarged. Scattered diverticuli noted left colon without diverticulitis. There is mild atherosclerotic calcification of the abdominal aorta without aneurysm. SKELETON: Scattered hypermetabolic bone metastases evident. 12 mm posterior right iliac sclerotic lesion on 02/20 8/3 shows SUV max = 5.6. Sacral metastases demonstrate SUV max = 5.3 subtle 13 mm left femoral neck lesion demonstrates SUV max = 5.3. Incidental CT findings: none IMPRESSION: 1. Similar right middle and left upper lobe collapse with associated marked hypermetabolism. There is diffuse interstitial and micro nodularity in  both lungs consistent with metastatic disease. 2. Hypermetabolic metastatic lymphadenopathy in the neck, mediastinum, hilar regions, gastrohepatic ligament, and retroperitoneum. 3. Marked hypermetabolism identified in the mid esophagus with associated esophageal wall thickening. This may be related to secondary involvement although primary esophageal neoplasm not excluded. 4. Hypermetabolic liver and bone metastases. 5. Hypermetabolic right adrenal nodule. This is felt to be likely an adenoma although metastatic involvement is certainly possible. 6. Hypermetabolic left thyroid nodule. Recommend thyroid US and biopsy. (Ref: J Am Coll Radiol. 2015 Feb;12(2): 143-50). 7.  Aortic Atherosclerois (ICD10-170.0) Electronically Signed   By: Misty Stanley M.D.   On: 06/14/2021 12:27   DG Chest Port 1 View  Result Date: 06/14/2021 CLINICAL DATA:  Dyspnea EXAM: PORTABLE CHEST 1 VIEW COMPARISON:  06/13/2021 FINDINGS: Cardiac shadow is stable. Right chest wall port is again seen and stable. Volume loss in the left hemithorax is noted with diffuse increased airspace opacity stable from the previous day. Mild right basilar opacity is noted stable in appearance as well. No new focal abnormality is noted. IMPRESSION: Stable bilateral airspace opacities similar to that seen on recent CT and plain film examination. Electronically Signed   By: Inez Catalina M.D.   On: 06/14/2021 19:39   DG Chest Port 1 View  Result Date: 06/13/2021 CLINICAL DATA:  Shortness of breath.  Recent pneumonia. EXAM: PORTABLE CHEST 1 VIEW COMPARISON:  Radiograph and chest CT 06/07/2021. FINDINGS: Right chest port remains in place. Similar volume loss in the left hemithorax with diffusely increased opacity, opacity throughout the left upper lobe with seen on recent CT. Opacity at the right lung base corresponds to right middle lobe collapse. Background bronchial thickening. Trace pleural effusions on CT are not well demonstrated by radiograph. No  pneumothorax or significant change from prior imaging. Heart size is grossly stable. IMPRESSION: 1. No significant change from prior imaging. 2. Volume loss in the left hemithorax with diffusely increased opacity throughout the left upper lung zone, upper lobe consolidation seen on recent CT. 3. Right lung base opacity corresponds to right middle lobe collapse. 4. Background bronchial thickening. Electronically Signed   By: Keith Rake M.D.   On: 06/13/2021 18:36   [place] OR NERVE BLOCK-IMAGE ONLY [facility])  Result Date: 06/28/2021 There is no interpretation for this exam.  This order is for images obtained during a surgical procedure.  Please See "Surgeries" Tab for more information regarding the procedure.   ECHOCARDIOGRAM COMPLETE  Result Date: 06/08/2021    ECHOCARDIOGRAM REPORT  Patient Name:   DEIONTE SPIVACK Date of Exam: 06/08/2021 Medical Rec #:  [id number]      Height:       69.0 in Accession #:    [id number]     Weight:       220.7 lb Date of Birth:  06-Aug-1956      BSA:          2.154 m Patient Age:    45 years       BP:           130/81 mmHg Patient Gender: M              HR:           108 bpm. Exam Location:  [facility] Procedure: 2D Echo, Cardiac Doppler, Color Doppler and Strain Analysis Indications:    CHF-acute diastolic W29.56  History:        Patient has no prior history of Echocardiogram examinations.                 COPD.  Sonographer:    Sherrie Sport Referring Phys: [id number] Ivor Costa Diagnosing      Kate Sable MD Phys:  Sonographer Comments: Suboptimal apical window. Global longitudinal strain was attempted. IMPRESSIONS  1. Left ventricular ejection fraction, by estimation, is 65 to 70%. The left ventricle has normal function. The left ventricle has no regional wall motion abnormalities. Left ventricular diastolic parameters are consistent with Grade I diastolic dysfunction (impaired relaxation).  2. Right ventricular systolic function is normal. The right ventricular size is mildly enlarged.   3. A small pericardial effusion is present.  4. The mitral valve is normal in structure. No evidence of mitral valve regurgitation.  5. The aortic valve is tricuspid. Aortic valve regurgitation is not visualized. FINDINGS  Left Ventricle: Left ventricular ejection fraction, by estimation, is 65 to 70%. The left ventricle has normal function. The left ventricle has no regional wall motion abnormalities. Global longitudinal strain performed but not reported based on interpreter judgement due to suboptimal tracking. 3D left ventricular ejection fraction analysis performed but not reported based on interpreter judgement due to suboptimal quality. The left ventricular internal cavity size was normal in size. There is no left ventricular hypertrophy. Left ventricular diastolic parameters are consistent with Grade I diastolic dysfunction (impaired relaxation). Right Ventricle: The right ventricular size is mildly enlarged. No increase in right ventricular wall thickness. Right ventricular systolic function is normal. Left Atrium: Left atrial size was normal in size. Right Atrium: Right atrial size was normal in size. Pericardium: A small pericardial effusion is present. Mitral Valve: The mitral valve is normal in structure. No evidence of mitral valve regurgitation. Tricuspid Valve: The tricuspid valve is grossly normal. Tricuspid valve regurgitation is trivial. Aortic Valve: The aortic valve is tricuspid. Aortic valve regurgitation is not visualized. Aortic valve mean gradient measures 6.0 mmHg. Aortic valve peak gradient measures 10.5 mmHg. Aortic valve area, by VTI measures 2.89 cm. Pulmonic Valve: The pulmonic valve was not well visualized. Pulmonic valve regurgitation is not visualized. Aorta: The aortic root is normal in size and structure. IAS/Shunts: No atrial level shunt detected by color flow Doppler.  LEFT VENTRICLE PLAX 2D LVIDd:         4.80 cm  Diastology LVIDs:         2.60 cm  LV e' medial:   1.96 cm/s LV  PW:         1.30 cm  LV E/e' medial: 38.7 LV IVS:  1.20 cm LVOT diam:     2.10 cm LV SV:         76 LV SV Index:   35 LVOT Area:     3.46 cm 3D Volume EF:                         3D EF:        52 %                         LV EDV:       96 ml                         LV ESV:       46 ml                         LV SV:        50 ml RIGHT VENTRICLE RV S prime:     14.00 cm/s TAPSE (M-mode): 5.7 cm LEFT ATRIUM             Index       RIGHT ATRIUM           Index LA diam:        3.30 cm 1.53 cm/m  RA Area:     21.00 cm LA Vol (A2C):   64.2 ml 29.80 ml/m RA Volume:   55.60 ml  25.81 ml/m LA Vol (A4C):   31.2 ml 14.48 ml/m LA Biplane Vol: 45.4 ml 21.07 ml/m  AORTIC VALVE                    PULMONIC VALVE AV Area (Vmax):    2.39 cm     PV Vmax:       0.73 m/s AV Area (Vmean):   2.78 cm     PV Peak grad:  2.1 mmHg AV Area (VTI):     2.89 cm AV Vmax:           162.00 cm/s AV Vmean:          108.000 cm/s AV VTI:            0.261 m AV Peak Grad:      10.5 mmHg AV Mean Grad:      6.0 mmHg LVOT Vmax:         112.00 cm/s LVOT Vmean:        86.800 cm/s LVOT VTI:          0.218 m LVOT/AV VTI ratio: 0.84  AORTA Ao Root diam: 3.30 cm MITRAL VALVE                TRICUSPID VALVE MV Area (PHT): 3.95 cm     TR Peak grad:   30.0 mmHg MV Decel Time: 192 msec     TR Vmax:        274.00 cm/s MV E velocity: 75.80 cm/s MV A velocity: 119.00 cm/s  SHUNTS MV E/A ratio:  0.64         Systemic VTI:  0.22 m                             Systemic Diam: 2.10 cm Kate Sable MD Electronically signed by Kate Sable MD Signature Date/Time: 06/08/2021/3:10:53 PM    Final  PERFORMANCE STATUS (ECOG) : 2 - Symptomatic, <50% confined to bed  Review of Systems Unless otherwise noted, a complete review of systems is negative.  Physical Exam General: NAD, nontoxic appearing, patient looks improved from when I saw him in the hospital Cardiovascular: regular rate and rhythm Pulmonary: Clear anterior/posterior fields, diminished  left side Abdomen: soft, nontender, + bowel sounds, PEG noted GU: no suprapubic tenderness Extremities: Bilateral lower extremity pitting edema, no joint deformities Skin: no rashes Neurological: Weakness but otherwise nonfocal  Assessment and Plan- Patient is a 65 y.o. male with multiple medical problems including O2 dependent COPD and recently diagnosed stage IV lung cancer not yet on treatment who was an add-on to [facility] for evaluation of hypotension   Hypotension worsened with sitting to standing consistent with orthostasis.  Labs grossly unchanged from baseline.  He appears clinically dehydrated and suspect that this is secondary to his not taking the prescribed amount of volume per his PEG.  Patient does not provide me an answer when asked why he is not following the recommended plan for nutritional supplements/volume.  We will have the nutritionist see him today.  Note that patient has lost 35 pounds over the past month.  We will plan gentle rehydration today with IV fluids.  Patient has chronic lower extremity pitting edema and is supposed to be on furosemide but says that he has not taken that since he was discharged from the hospital.  It does not sound like he is compliant with his other medications either.  We will send referral to cardiology given history of diastolic dysfunction.  I am concerned that patient does not have significant social support.  This is likely to complicate plan for systemic chemotherapy.  Patient's brother is involved and it was my understanding that the brother was planning to stay with patient to provide ongoing care but this has not occurred.  I offered to call patient's brother but the patient would not allow this.  Unfortunately, the patient cannot have home health as he is uninsured.  Social work is working to help coordinate resources.  We will make home palliative care referral.  ER triggers reviewed with patient.  I offered to schedule follow-up first of  the week for repeat labs and possible fluids but patient would prefer to keep his appointment as previously scheduled next Thursday.   Patient expressed understanding and was in agreement with this plan. He also understands that He can call clinic at any time with any questions, concerns, or complaints.   Thank you for allowing me to participate in the care of this very pleasant patient.   Time Total: 25 minutes  Visit consisted of counseling and education dealing with the complex and emotionally intense issues of symptom management and palliative care in the setting of serious and potentially life-threatening illness.Greater than 50%  of this time was spent counseling and coordinating care related to the above assessment and plan.  Signed by: Altha Harm, PhD, NP-C

## 2021-07-07 NOTE — Progress Notes (Signed)
[facility]  Telephone:([phone] [phone]  Patient Care Team: Pcp, No as PCP - General Telford Nab, RN as Oncology Nurse Navigator   Name of the patient: Derek Clarke  [id number]  1956-06-25   Date of visit: 07/07/21  HPI: Patient is a 65 y.o. male with newly diagnosed metastatic NSCLC. 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 His treatment will now be changed to targeted therapy with Tagrisso (osimertinib).  Reason for Consult: Osimertinib oral chemotherapy education.   PAST MEDICAL HISTORY: Past Medical History:  Diagnosis Date   Adenocarcinoma ([place])    lung; stage 4 with mets to liver   Chronic respiratory failure with hypoxia (H[facility])    baseline 4 L [place]   COPD (chronic obstructive pulmonary disease) (HCC)    GAD (generalized anxiety disorder)     HEMATOLOGY/ONCOLOGY HISTORY:  Oncology History  Adenocarcinoma of left lung ([place])  05/26/2021 Initial Diagnosis   Adenocarcinoma of left lung ([place])   05/27/2021 Cancer Staging   Staging form: Lung, AJCC 8th Edition - Clinical stage from 05/27/2021: Stage IVB (cTX, cN3, pM1c) - Signed by Lloyd Huger, MD on 05/27/2021   06/16/2021 -  Chemotherapy    Patient is on Treatment Plan: LUNG NSCLC CARBOPLATIN + PACLITAXEL + BEVACIZUMAB Q21D X 1 CYCLE         ALLERGIES:  has No Known Allergies.  MEDICATIONS:  Current Outpatient Medications  Medication Sig Dispense Refill   albuterol (VENTOLIN HFA) 108 (90 Base) MCG/ACT inhaler Inhale 2 puffs into the lungs every 6 (six) hours as needed for wheezing or shortness of breath. 8 g 2   ALPRAZolam (XANAX) 0.5 MG tablet Place 1 tablet (0.5 mg total) into feeding tube 2 (two) times daily as needed for anxiety. 60 tablet 0   busPIRone (BUSPAR) 5 MG tablet Take 1 tablet (5 mg total) by mouth 3 (three) times daily. 90  tablet 1   fluticasone furoate-vilanterol (BREO ELLIPTA) 100-25 MCG/INH AEPB Inhale 1 puff into the lungs daily. 1 each 1   furosemide (LASIX) 40 MG tablet Place 1 tablet (40 mg total) into feeding tube daily. 30 tablet 0   guaiFENesin (ROBITUSSIN) 100 MG/5ML SOLN Place 10 mLs (200 mg total) into feeding tube every 4 (four) hours as needed for cough or to loosen phlegm. 236 mL 0   lactulose (CHRONULAC) 10 GM/15ML solution Place 30 mLs (20 g total) into feeding tube 2 (two) times daily as needed for mild constipation. 236 mL 0   Multiple Vitamin (MULTIVITAMIN WITH MINERALS) TABS tablet Place 1 tablet into feeding tube daily. 90 tablet 0   Nutritional Supplements (FEEDING SUPPLEMENT, OSMOLITE 1.5 CAL,) LIQD Place 237 mLs into feeding tube 6 (six) times daily.  0   Nutritional Supplements (FEEDING SUPPLEMENT, PROSOURCE TF,) liquid Place 45 mLs into feeding tube 3 (three) times daily.     oxyCODONE (OXY IR/ROXICODONE) 5 MG immediate release tablet Place 1 tablet (5 mg total) into feeding tube every 4 (four) hours as needed for moderate pain. 10 tablet 0   polyethylene glycol (MIRALAX) 17 g packet Place 17 g into feeding tube daily. 14 each 0   potassium chloride (KLOR-CON) 20 MEQ packet Place 20 mEq into feeding tube daily. 30 packet 0   predniSONE (DELTASONE) 20 MG tablet Place 1.5 tablets (30 mg total) into feeding tube daily with breakfast for 2 days, THEN 1 tablet (  20 mg total) daily with breakfast for 3 days, THEN 0.5 tablets (10 mg total) daily with breakfast for 3 days. 7.5 tablet 0   Water For Irrigation, Sterile (FREE WATER) SOLN Place 120 mLs into feeding tube 6 (six) times daily.     No current facility-administered medications for this visit.    VITAL SIGNS: BP 93/62   Pulse (!) 111   Temp (!) 96 F (35.6 C)   Resp (!) 24   Wt 88.9 kg (195 lb 14.4 oz)   SpO2 90%   PF (!) 4 L/min   BMI 28.93 kg/m  Filed Weights   07/07/21 1340  Weight: 88.9 kg (195 lb 14.4 oz)    Estimated body  mass index is 28.93 kg/m as calculated from the following:   Height as of 06/15/21: '5\' 9"'  (1.753 m).   Weight as of this encounter: 88.9 kg (195 lb 14.4 oz).  LABS: CBC:    Component Value Date/Time   WBC 7.8 07/07/2021 1305   HGB 13.4 07/07/2021 1305   HCT 39.9 07/07/2021 1305   PLT 268 07/07/2021 1305   MCV 83.1 07/07/2021 1305   NEUTROABS 5.1 07/07/2021 1305   LYMPHSABS 1.3 07/07/2021 1305   MONOABS 1.1 (H) 07/07/2021 1305   EOSABS 0.0 07/07/2021 1305   BASOSABS 0.0 07/07/2021 1305   Comprehensive Metabolic Panel:    Component Value Date/Time   NA 136 07/07/2021 1305   K 3.7 07/07/2021 1305   CL 97 (L) 07/07/2021 1305   CO2 30 07/07/2021 1305   BUN 24 (H) 07/07/2021 1305   CREATININE 0.70 07/07/2021 1305   GLUCOSE 100 (H) 07/07/2021 1305   CALCIUM 9.0 07/07/2021 1305   AST 28 07/07/2021 1305   ALT 44 07/07/2021 1305   ALKPHOS 106 07/07/2021 1305   BILITOT 0.7 07/07/2021 1305   PROT 6.3 (L) 07/07/2021 1305   ALBUMIN 3.4 (L) 07/07/2021 1305     Present during today's visit: patient only  Assessment and Plan: Start plan: Patient will get started on osimertinib 72m daily today, 07/07/21   Patient Education I spoke with patient for overview of new oral chemotherapy medication: osimertinib   Administration: Counseled patient on administration, dosing, side effects, monitoring, drug-food interactions, safe handling, storage, and disposal.  Patient will administer his tablet via PEG tube. Reviewed with Derek Clarke instructions for dissolving his tablets prior to administration and provided him with written instructions. Place one tablet in cup Dissolve it in 2 ounces (60 milliliters [mL]) of water. Stir until the tablet is in small pieces. Immediately administer via PEG tube. Add 4 to 8 ounces (120 to 240 mL) of water in the same cup, and again administer via PEG tube  Side Effects: Side effects include but not limited to: rash/itchy skin, diarrhea, decreased  wbc/hgb/plt.    Drug-drug Interactions (DDI): Reviewed ondansetron DDI with Derek Clarke he stated that due to cost he never picked up the ondansetron from the pharmacy  Adherence: After discussion with patient one patient barriers to medication adherence identified. He need to administer via PEG tube, which is new to him Reviewed with patient importance of keeping a medication schedule and plan for any missed doses.  Mr. DMcdowellvoiced understanding and appreciation. All questions answered. Medication handout provided.  [facility]. Patient knows to call the office with questions or concerns. Oral Chemotherapy Navigation Clinic will continue to follow.  Patient expressed understanding and was in agreement with this plan.  He also understands that He can call clinic at any time with any questions, concerns, or complaints.   Medication Access Issues: Patient signed assistance application during visit today  Follow-up plan: RTC in one week  Thank you for allowing me to participate in the care of this patient.   Time Total: 25 mins  Visit consisted of counseling and education on dealing with issues of symptom management in the setting of serious and potentially life-threatening illness.Greater than 50%  of this time was spent counseling and coordinating care related to the above assessment and plan.  Signed by: Darl Pikes, PharmD, BCPS, Salley Slaughter, CPP Hematology/Oncology Clinical Pharmacist Practitioner [facility] [phone]  07/07/2021 2:29 PM

## 2021-07-07 NOTE — Progress Notes (Signed)
Patient recently discharged from hospital.  His plain left side abdomen/chest pain 4-5/10.

## 2021-07-08 ENCOUNTER — Telehealth: Payer: Self-pay | Admitting: Pharmacy Technician

## 2021-07-08 ENCOUNTER — Telehealth: Payer: Self-pay

## 2021-07-08 ENCOUNTER — Encounter: Payer: Self-pay | Admitting: Oncology

## 2021-07-08 NOTE — Progress Notes (Signed)
Met with patient during follow up visit with oral chemo clinic to initiate new treatment. During visit, pt was in need of several resources including symptom management, dietitian, and social work. Pt met with all providers and his needs/barriers were addressed. Pt instructed to call with any questions or needs. Will follow up with patient at next visit and address any further needs at that time.

## 2021-07-08 NOTE — Telephone Encounter (Signed)
Arranged transportation for 10-20 visit with [employer].  Patient will be picked up for appt by  transportation .

## 2021-07-08 NOTE — Telephone Encounter (Signed)
Spoke with patient and scheduled an in-person Palliative Consult for 08/08/21 @ 12:30PM per patient's request.  COVID screening was negative. No pets in home. Patient lives alone.  Consent obtained; updated Outlook/Netsmart/Team List and Epic.   Patient is aware he may be receiving a call from provider the day before or day of to confirm appointment.

## 2021-07-08 NOTE — Telephone Encounter (Signed)
Oral Oncology Patient Advocate [name] met with patient on 28/11/88 to complete application for [place] Patient [place] Program in an effort to reduce the patient's out of pocket expense for Tagrisso to $0.    Application completed and faxed to [phone].   [place] patient assistance phone number for follow up is [phone].   This encounter will be updated until final determination.  [facility] Patient [place] Phone [phone] Fax [phone] 07/13/2021 3:27 PM

## 2021-07-11 NOTE — Progress Notes (Signed)
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  INTERVAL HISTORY: Patient returns to clinic today for further evaluation and to assess his toleration of Tagrisso.  He continues to have chronic weakness and fatigue and chronic shortness of breath, but otherwise feels well and feels like he is getting stronger.  He is tolerating Tagrisso well without significant side effects.  He is also tolerating his tube feeds.  He has no neurologic complaints.  He denies any recent fevers. He has no chest pain or hemoptysis.  He continues to have chronic cough and secretions.  He has no nausea, vomiting, constipation, or diarrhea.  He has no urinary complaints.  Patient offers no further specific complaints today.  REVIEW OF SYSTEMS:   Review of Systems  Constitutional:  Positive for malaise/fatigue. Negative for fever and weight loss.  Respiratory:  Positive for cough, sputum production and shortness of breath. Negative for hemoptysis.   Cardiovascular: Negative.  Negative for chest pain and leg swelling.  Gastrointestinal: Negative.  Negative for abdominal pain.  Genitourinary: Negative.  Negative for dysuria and hematuria.  Musculoskeletal: Negative.  Negative for back pain.  Skin: Negative.  Negative for rash.  Neurological:  Positive for weakness. Negative for dizziness, focal weakness and headaches.  Psychiatric/Behavioral: Negative.  The patient is not nervous/anxious.    As per HPI. Otherwise, a complete review of systems is negative.  PAST MEDICAL HISTORY: Past Medical History:  Diagnosis Date   Adenocarcinoma ([place])    lung; stage 4 with mets to liver   Chronic respiratory  failure with hypoxia (HCC)    baseline 4 L [place]   COPD (chronic obstructive pulmonary disease) (HCC)    GAD (generalized anxiety disorder)     PAST SURGICAL HISTORY: Past Surgical History:  Procedure Laterality Date   IR GASTROSTOMY TUBE MOD SED  06/28/2021   IR IMAGING GUIDED PORT INSERTION  06/03/2021    FAMILY HISTORY: Family History  Problem Relation Age of Onset   Lung disease Mother    Dementia Father     ADVANCED DIRECTIVES (Y/N):  N  HEALTH MAINTENANCE: Social History   Tobacco Use   Smoking status: Former    Packs/day: 1.00    Years: 30.00    Pack years: 30.00    Types: Cigarettes    Quit date: 03/26/2021    Years since quitting: 0.3   Smokeless tobacco: Never  Substance Use Topics   Alcohol use: Yes   Drug use: Yes    Types: Marijuana    Comment: quit approx. 5 years ago     Colonoscopy:  PAP:  Bone density:  Lipid panel:  No Known Allergies  Current Outpatient Medications  Medication Sig Dispense Refill   albuterol (VENTOLIN HFA) 108 (90 Base) MCG/ACT inhaler Inhale 2 puffs into the lungs every 6 (six) hours as needed for wheezing or shortness of breath. 8 g 2   ALPRAZolam (XANAX) 0.5 MG tablet Place 1 tablet (0.5 mg total) into feeding tube 2 (two) times daily as needed for anxiety. 60 tablet 0   busPIRone (BUSPAR) 5 MG tablet Take 1 tablet (5 mg total) by mouth 3 (three) times daily. 90 tablet 1   furosemide (LASIX) 40 MG tablet Place  1 tablet (40 mg total) into feeding tube daily. 30 tablet 0   guaiFENesin (ROBITUSSIN) 100 MG/5ML SOLN Place 10 mLs (200 mg total) into feeding tube every 4 (four) hours as needed for cough or to loosen phlegm. 236 mL 0   Multiple Vitamin (MULTIVITAMIN WITH MINERALS) TABS tablet Place 1 tablet into feeding tube daily. 90 tablet 0   Nutritional Supplements (FEEDING SUPPLEMENT, OSMOLITE 1.5 CAL,) LIQD Place 237 mLs into feeding tube 6 (six) times daily.  0   Nutritional Supplements (FEEDING SUPPLEMENT, PROSOURCE TF,) liquid  Place 45 mLs into feeding tube 3 (three) times daily.     osimertinib mesylate (TAGRISSO) 80 MG tablet Take 1 tablet (80 mg total) by mouth daily. 30 tablet 2   potassium chloride (KLOR-CON) 20 MEQ packet Place 20 mEq into feeding tube daily. 30 packet 0   Water For Irrigation, Sterile (FREE WATER) SOLN Place 120 mLs into feeding tube 6 (six) times daily.     No current facility-administered medications for this visit.    OBJECTIVE: Vitals:   07/14/21 1500  BP: 101/61  Pulse: 98  Temp: (!) 96.8 F (36 C)     Body mass index is 28.94 kg/m.    ECOG FS:2 - Symptomatic, <50% confined to bed  General: Well-developed, well-nourished, no acute distress. Eyes: Pink conjunctiva, anicteric sclera. HEENT: Normocephalic, moist mucous membranes. Lungs: No audible wheezing or coughing. Heart: Regular rate and rhythm. Abdomen: Soft, nontender, no obvious distention. Musculoskeletal: No edema, cyanosis, or clubbing. Neuro: Alert, answering all questions appropriately. Cranial nerves grossly intact. Skin: No rashes or petechiae noted. Psych: Normal affect.  LAB RESULTS:  Lab Results  Component Value Date   NA 134 (L) 07/14/2021   K 3.7 07/14/2021   CL 92 (L) 07/14/2021   CO2 34 (H) 07/14/2021   GLUCOSE 144 (H) 07/14/2021   BUN 17 07/14/2021   CREATININE 0.66 07/14/2021   CALCIUM 9.5 07/14/2021   PROT 6.8 07/14/2021   ALBUMIN 3.7 07/14/2021   AST 22 07/14/2021   ALT 25 07/14/2021   ALKPHOS 95 07/14/2021   BILITOT 0.7 07/14/2021   GFRNONAA >60 07/14/2021    Lab Results  Component Value Date   WBC 9.8 07/14/2021   NEUTROABS 7.7 07/14/2021   HGB 14.0 07/14/2021   HCT 42.4 07/14/2021   MCV 85.3 07/14/2021   PLT 153 07/14/2021     STUDIES: DG Chest 2 View  Result Date: 06/23/2021 CLINICAL DATA:  COPD exacerbation EXAM: CHEST - 2 VIEW COMPARISON:  06/14/2021 FINDINGS: Frontal and lateral views of the chest demonstrates stable right chest wall port. The cardiac silhouette is  unremarkable. Dense left upper and right middle lobe consolidation again noted. Chronic vascular congestion. No effusion or pneumothorax. IMPRESSION: 1. Persistent left upper and right middle lobe consolidation consistent with postobstructive changes based on hilar masses seen on previous CT. 2. Chronic vascular congestion. Electronically Signed   By: Randa Ngo M.D.   On: 06/23/2021 20:19   DG Abd 1 View  Result Date: 06/30/2021 CLINICAL DATA:  History of adenocarcinoma EXAM: ABDOMEN - 1 VIEW COMPARISON:  X-ray abdomen dated June 29, 2021 FINDINGS: Free intraperitoneal air, decreased when compared with prior radiograph. Nonobstructive bowel gas pattern. Moderate stool burden in the right colon. Gastrostomy tube in place. New heterogeneous opacities of the left lower lung. IMPRESSION: Decreased free intraperitoneal air, likely postprocedural and related to gastrostomy tube placement. New heterogeneous opacities of the left lower lung, concerning for infection or aspiration. Recommend dedicated chest x-ray for  better evaluation. Electronically Signed   By: Yetta Glassman M.D.   On: 06/30/2021 10:56   DG Abd 1 View  Result Date: 06/29/2021 CLINICAL DATA:  Gastrostomy tube complication EXAM: ABDOMEN - 1 VIEW COMPARISON:  06/28/2021 FINDINGS: Lung bases clear. Free air again identified under the LEFT hemidiaphragm. Gastrostomy tube LEFT upper quadrant. Stool in colon with small amount of contrast in small bowel loop mid abdomens in LEFT abdomen. Few scattered sclerotic foci in the pelvis and proximal LEFT femur, patient with known osseous metastases. IMPRESSION: Persistent free intraperitoneal air under the LEFT hemidiaphragm. Electronically Signed   By: Lavonia Dana M.D.   On: 06/29/2021 17:52   DG Abd 1 View  Result Date: 06/28/2021 CLINICAL DATA:  Abdomen pain recent procedure EXAM: ABDOMEN - 1 VIEW COMPARISON:  06/28/2021, 06/23/2021 FINDINGS: Trace free air beneath the right diaphragm with small  moderate volume free air beneath the left diaphragm. Patient is status post percutaneous gastrostomy. There is a nonobstructed gas pattern. IMPRESSION: Small moderate free air within the abdomen, potentially related to recent percutaneous gastrostomy tube placement. Consider serial radiographic follow-up to ensure resolution/decreasing free air. These results will be called to the ordering clinician or representative by the Radiologist Assistant, and communication documented in the PACS or [employer]. Electronically Signed   By: Donavan Foil M.D.   On: 06/28/2021 22:51   IR GASTROSTOMY TUBE MOD SED  Result Date: 06/28/2021 INDICATION: Cancer, dysphagia with malnutrition EXAM: Placement of percutaneous gastrostomy tube using fluoroscopic guidance MEDICATIONS: Glucagon 1 mg IV ANESTHESIA/SEDATION: Sedation provided by the Department of Anesthesiology. Refer to separate anesthesia records. CONTRAST:  20 mL-administered into the gastric lumen. FLUOROSCOPY TIME:  Fluoroscopy Time: 2 minutes 30 seconds (35 mGy). COMPLICATIONS: None immediate. PROCEDURE: Informed written consent was obtained from the patient after a thorough discussion of the procedural risks, benefits and alternatives. All questions were addressed. Maximal Sterile Barrier Technique was utilized including caps, mask, sterile gowns, sterile gloves, sterile drape, hand hygiene and skin antiseptic. A timeout was performed prior to the initiation of the procedure. Review of pre-procedure CT scan demonstrates an adequate window for percutaneous placement of a gastrostomy tube. The patient was placed supine on the exam table. An angled 5 French catheter was passed through the nares into the stomach by the anesthesia team. It was secured to the nose, and the stomach was insufflated with air. The abdomen was prepped and draped in the standard sterile fashion. After insufflating the stomach with air, puncture sites were selected and local analgesia was  obtained with 1% lidocaine. Using fluoroscopic guidance, a gastropexy needle was advanced into the stomach and the T-bar suture was released. Entry into the stomach was confirmed with fluoroscopy, aspiration of air, and injection of contrast material. This was repeated with an additional gastropexy suture (for a total of 3 fasteners). At the center of these gastropexy sutures, a dermatotomy was performed. An 18 gauge needle was then passed into the stomach, and position within the gastric lumen again confirmed under fluoroscopy using aspiration of air and injection of contrast material. An Amplatz guidewire was passed through this needle and intraluminal placement was confirmed with fluoroscopy. The needle was removed, and over the guidewire, a 20 French balloon gastrostomy tube with a coaxial 10 mm balloon was advanced into the percutaneous tract. Dilation of the percutaneous tract was then performed using the balloon, followed by advancement of the gastrostomy tube into the gastric lumen. The retention balloon was then inflated with 19 mL of sterile water,  and the tube was brought back to the gastric wall. The wire and balloon were removed. The external bumper was brought to the skin. Location of the gastrostomy tube within the stomach was then confirmed with injection of contrast material, opacifying the gastric lumen. The gastrostomy tube was flushed with sterile water, and secured to the skin using a dressing. The patient tolerated the procedure well without immediate complication, and was transferred to recovery in stable condition. IMPRESSION: Successful placement of a 20 French percutaneous gastrostomy tube using fluoroscopic guidance. Electronically Signed   By: Albin Felling M.D.   On: 06/28/2021 14:42   DG Chest Port 1 View  Result Date: 06/14/2021 CLINICAL DATA:  Dyspnea EXAM: PORTABLE CHEST 1 VIEW COMPARISON:  06/13/2021 FINDINGS: Cardiac shadow is stable. Right chest wall port is again seen and  stable. Volume loss in the left hemithorax is noted with diffuse increased airspace opacity stable from the previous day. Mild right basilar opacity is noted stable in appearance as well. No new focal abnormality is noted. IMPRESSION: Stable bilateral airspace opacities similar to that seen on recent CT and plain film examination. Electronically Signed   By: Inez Catalina M.D.   On: 06/14/2021 19:39   [place] OR NERVE BLOCK-IMAGE ONLY [facility])  Result Date: 06/28/2021 There is no interpretation for this exam.  This order is for images obtained during a surgical procedure.  Please See "Surgeries" Tab for more information regarding the procedure.    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  EGFR positive.  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  MRI of the brain on May 18, 2021 negative for disease.  PET scan results from June 13, 2021 revealed widespread metastatic disease with innumerable bilateral lung and liver lesions as well as bony lesions.  Patient received 1 dose of carboplatinum, Taxol, and Avastin on June 16, 2021.  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  Continue treatment until intolerable side effects or progression of disease.  We will also need to add in Zometa in the near future.  Appreciate clinical pharmacy and dietary input.  Return to clinic in 2 weeks with repeat laboratory work and further evaluation.   Shortness of breath/oxygen requirement: Improving.  Continue follow-up with pulmonology as scheduled.  Continue oxygen supplementation as prescribed.   Tube feeding: Appreciate dietary input.  Continue as prescribed.   Leukocytosis: Resolved.  I spent a total of 30 minutes reviewing chart data, face-to-face evaluation with the patient, counseling and  coordination of care as detailed above.   Patient expressed understanding and was in agreement with this plan. He also understands that He can call clinic at any time with any questions, concerns, or complaints.   Cancer Staging Adenocarcinoma of left lung [facility]) Staging form: Lung, AJCC 8th Edition - Clinical stage from 05/27/2021: Stage IVB (cTX, cN3, pM1c) - Signed by Lloyd Huger, MD on 05/27/2021  Lloyd Huger, MD   07/14/2021 4:09 PM

## 2021-07-13 ENCOUNTER — Telehealth: Payer: Self-pay

## 2021-07-14 ENCOUNTER — Inpatient Hospital Stay (HOSPITAL_BASED_OUTPATIENT_CLINIC_OR_DEPARTMENT_OTHER): Payer: Self-pay | Admitting: Oncology

## 2021-07-14 ENCOUNTER — Ambulatory Visit (INDEPENDENT_AMBULATORY_CARE_PROVIDER_SITE_OTHER): Payer: Self-pay | Admitting: Cardiology

## 2021-07-14 ENCOUNTER — Inpatient Hospital Stay: Payer: Self-pay

## 2021-07-14 ENCOUNTER — Inpatient Hospital Stay: Payer: Self-pay | Admitting: Pharmacist

## 2021-07-14 ENCOUNTER — Other Ambulatory Visit: Payer: Self-pay

## 2021-07-14 ENCOUNTER — Encounter: Payer: Self-pay | Admitting: Oncology

## 2021-07-14 ENCOUNTER — Encounter: Payer: Self-pay | Admitting: Cardiology

## 2021-07-14 ENCOUNTER — Inpatient Hospital Stay: Payer: Self-pay | Admitting: *Deleted

## 2021-07-14 ENCOUNTER — Encounter: Payer: Self-pay | Admitting: *Deleted

## 2021-07-14 VITALS — BP 101/61 | HR 98 | Temp 96.8°F | Wt 196.0 lb

## 2021-07-14 VITALS — BP 100/62 | HR 91 | Ht 69.0 in | Wt 198.0 lb

## 2021-07-14 DIAGNOSIS — I5189 Other ill-defined heart diseases: Secondary | ICD-10-CM

## 2021-07-14 DIAGNOSIS — C3492 Malignant neoplasm of unspecified part of left bronchus or lung: Secondary | ICD-10-CM

## 2021-07-14 DIAGNOSIS — R600 Localized edema: Secondary | ICD-10-CM

## 2021-07-14 DIAGNOSIS — R0602 Shortness of breath: Secondary | ICD-10-CM

## 2021-07-14 LAB — CBC WITH DIFFERENTIAL/PLATELET
Abs Immature Granulocytes: 0.1 10*3/uL — ABNORMAL HIGH (ref 0.00–0.07)
Basophils Absolute: 0 10*3/uL (ref 0.0–0.1)
Basophils Relative: 0 %
Eosinophils Absolute: 0 10*3/uL (ref 0.0–0.5)
Eosinophils Relative: 0 %
HCT: 42.4 % (ref 39.0–52.0)
Hemoglobin: 14 g/dL (ref 13.0–17.0)
Immature Granulocytes: 1 %
Lymphocytes Relative: 11 %
Lymphs Abs: 1.1 10*3/uL (ref 0.7–4.0)
MCH: 28.2 pg (ref 26.0–34.0)
MCHC: 33 g/dL (ref 30.0–36.0)
MCV: 85.3 fL (ref 80.0–100.0)
Monocytes Absolute: 0.8 10*3/uL (ref 0.1–1.0)
Monocytes Relative: 8 %
Neutro Abs: 7.7 10*3/uL (ref 1.7–7.7)
Neutrophils Relative %: 80 %
Platelets: 153 10*3/uL (ref 150–400)
RBC: 4.97 MIL/uL (ref 4.22–5.81)
RDW: 14.7 % (ref 11.5–15.5)
WBC: 9.8 10*3/uL (ref 4.0–10.5)
nRBC: 0 % (ref 0.0–0.2)

## 2021-07-14 LAB — COMPREHENSIVE METABOLIC PANEL
ALT: 25 U/L (ref 0–44)
AST: 22 U/L (ref 15–41)
Albumin: 3.7 g/dL (ref 3.5–5.0)
Alkaline Phosphatase: 95 U/L (ref 38–126)
Anion gap: 8 (ref 5–15)
BUN: 17 mg/dL (ref 8–23)
CO2: 34 mmol/L — ABNORMAL HIGH (ref 22–32)
Calcium: 9.5 mg/dL (ref 8.9–10.3)
Chloride: 92 mmol/L — ABNORMAL LOW (ref 98–111)
Creatinine, Ser: 0.66 mg/dL (ref 0.61–1.24)
GFR, Estimated: >60 mL/min (ref >60)
Glucose, Bld: 144 mg/dL — ABNORMAL HIGH (ref 70–99)
Potassium: 3.7 mmol/L (ref 3.5–5.1)
Sodium: 134 mmol/L — ABNORMAL LOW (ref 135–145)
Total Bilirubin: 0.7 mg/dL (ref 0.3–1.2)
Total Protein: 6.8 g/dL (ref 6.5–8.1)

## 2021-07-14 NOTE — Progress Notes (Signed)
Nutrition Follow-up:  Patient with stage IV adenocarcinoma of lung with liver mets.  PEG placed on 10/3 during recent hospitalization.   Met with patient in clinic.  Patient completed feeding of 1 carton of osmolite 1.5 earlier today in clinic with RN.  Says that he has given sometimes 5 cartons of tube feeding per day.  Can't tell RD when he is giving feeding.  Says that sometimes he has giving 1 carton at one feeding and other times maybe 1 carton and 1/3rd or 1/2 of another carton.  Says he tolerated well.  Has not been giving additional water between feeding.  Says he is giving medications with feeding, he thinks 2 times per day.  Unsure amount of water he is giving with medication.  Denies nausea. Says that he had a bowel movement yesterday and it was normal.  Denies constipation or diarrhea or abdominal pain.      Medications: reviewed  Labs: Na 134, glucose 144, BUN and creatinine WNL  Anthropometrics:   Weight 198 lb today   Estimated Energy Needs  Kcals: 2225-2670 Protein: 110-130 g Fluid: 2.2 L  NUTRITION DIAGNOSIS: Inadequate oral intake relying on feeding for nutrition   INTERVENTION:  Patient able to give 240 ml water hydration successfully during visit with RD today.   Discussed with patient option of giving 2 cartons of osmolite 1.5, TID.  Flush with 43m of water before and after. Will need additional 2460mBID between feedings.  Asked patient to measure water being giving with medications and write it down for RD.  Encouraged patient to not give medications with formula is giving 2 cartons at one feeding.  He verbalized understanding.  Written instructions given to patient with new tube feeding regimen. Contact information given      MONITORING, EVALUATION, GOAL: weight trends, tube feeding   NEXT VISIT: phone call in 1 week See in clinic in 2 weeks  Derek Clarke B. AlZenia ResidesRDStrong CityLDNew Villageegistered Dietitian [phone])

## 2021-07-14 NOTE — Progress Notes (Signed)
[facility] Clinical Social Work   Clinical Social Worker met with patient  to offer support and assess for needs. Derek Clarke is [facility].  Patient was alone for today's visit. He identified his brother, Derek Clarke, as his primary support.  CSW contacted patient's Medicaid worker (as shared by Darcy Copeland [facility] Financial counselor)- Medicaid DSS worker Derek Clarke explained she still needs bank statements from patient to determine if he would qualify for benefits.  CSW discussed this request with patient- he will work on retrieving needed statements.   CSW and patient called the VA eligibility hotline together. CSW assisted patient in applying for benefits. Next steps, patient will receive VA documentation request by mail. Patient needs to complete form and show proof of income- patient verbalized understanding.  Patient reported experiencing some mild anxiety regarding multiple appointments and managing symptoms.   CSW validated and normalized feelings as typical response to a cancer diagnosis.  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  He shared he still enjoys listening to music.  CSW will continue to follow patient to provide support and advocate on patient's behalf.     Lauren Somers, LCSW  Clinical Social Worker [facility]         

## 2021-07-14 NOTE — Patient Instructions (Signed)
Medication Instructions:  Your physician recommends that you continue on your current medications as directed. Please refer to the Current Medication list given to you today.  *If you need a refill on your cardiac medications before your next appointment, please call your pharmacy*   Lab Work: None ordered If you have labs (blood work) drawn today and your tests are completely normal, you will receive your results only by: [place] (if you have MyChart) OR A paper copy in the mail If you have any lab test that is abnormal or we need to change your treatment, we will call you to review the results.   Testing/Procedures: None ordered   Follow-Up: At [facility], you and your health needs are our priority.  As part of our continuing mission to provide you with exceptional heart care, we have created designated Provider Care Teams.  These Care Teams include your primary Cardiologist (physician) and Advanced Practice Providers (APPs -  Physician Assistants and Nurse Practitioners) who all work together to provide you with the care you need, when you need it.  We recommend signing up for the patient portal called "MyChart".  Sign up information is provided on this After Visit Summary.  MyChart is used to connect with patients for Virtual Visits (Telemedicine).  Patients are able to view lab/test results, encounter notes, upcoming appointments, etc.  Non-urgent messages can be sent to your provider as well.   To learn more about what you can do with MyChart, go to [email or web address].    Your next appointment:   3 month(s)  The format for your next appointment:   In Person  Provider:   Kate Sable, MD   Other Instructions

## 2021-07-14 NOTE — Progress Notes (Signed)
Cardiology Office Note:    Date:  07/14/2021   ID:  Derek Clarke, DOB 10-12-1955, MRN [id number]  PCP:  Kathyrn Lass   [facility] Providers Cardiologist:  Kate Sable, MD     Referring MD: Regenia Skeeter Kirt Boys, NP   Chief Complaint  Patient presents with   New Patient (Initial Visit)    Urgent referral by [place] for CHF. Patient c.o swelling in feet. Meds reviewed verbally with patient.     History of Present Illness:    Derek Clarke is a 65 y.o. male with a hx of stage IV adenocarcinoma of the lung, former smoker x30+ years, COPD presenting with shortness of breath and edema.  Recently admitted to the hospital for acute respiratory failure x14 days.  Treated for COPD exacerbation and postobstructive pneumonia, aspiration had PEG tube placed 06/28/2021.  Was managed with IV Lasix while in the hospital, started on p.o. Lasix at home.  Has been taking p.o. Lasix 40 mg daily for about a week now.  He states missing a couple of doses of Lasix.  Had to cut up issues in order to make them feet his legs due to swelling.  States not wanting to increase dose of Lasix.  And is willing to be more compliant with diuretics.  Has shortness of breath at rest and with minimal exertion.  Follows up at the cancer center for chemotherapy.  Echocardiogram 06/08/2021.  EF 65 to 57%, grade 1 diastolic dysfunction.  Past Medical History:  Diagnosis Date   Adenocarcinoma ([place])    lung; stage 4 with mets to liver   Chronic respiratory failure with hypoxia (HCC)    baseline 4 L [place]   COPD (chronic obstructive pulmonary disease) (HCC)    GAD (generalized anxiety disorder)     Past Surgical History:  Procedure Laterality Date   IR GASTROSTOMY TUBE MOD SED  06/28/2021   IR IMAGING GUIDED PORT INSERTION  06/03/2021    Current Medications: Current Meds  Medication Sig   albuterol (VENTOLIN HFA) 108 (90 Base) MCG/ACT inhaler Inhale 2 puffs into the lungs every 6 (six) hours as needed for  wheezing or shortness of breath.   ALPRAZolam (XANAX) 0.5 MG tablet Place 1 tablet (0.5 mg total) into feeding tube 2 (two) times daily as needed for anxiety.   busPIRone (BUSPAR) 5 MG tablet Take 1 tablet (5 mg total) by mouth 3 (three) times daily.   furosemide (LASIX) 40 MG tablet Place 1 tablet (40 mg total) into feeding tube daily.   guaiFENesin (ROBITUSSIN) 100 MG/5ML SOLN Place 10 mLs (200 mg total) into feeding tube every 4 (four) hours as needed for cough or to loosen phlegm.   Multiple Vitamin (MULTIVITAMIN WITH MINERALS) TABS tablet Place 1 tablet into feeding tube daily.   Nutritional Supplements (FEEDING SUPPLEMENT, OSMOLITE 1.5 CAL,) LIQD Place 237 mLs into feeding tube 6 (six) times daily.   Nutritional Supplements (FEEDING SUPPLEMENT, PROSOURCE TF,) liquid Place 45 mLs into feeding tube 3 (three) times daily.   osimertinib mesylate (TAGRISSO) 80 MG tablet Take 1 tablet (80 mg total) by mouth daily.   potassium chloride (KLOR-CON) 20 MEQ packet Place 20 mEq into feeding tube daily.   Water For Irrigation, Sterile (FREE WATER) SOLN Place 120 mLs into feeding tube 6 (six) times daily.     Allergies:   Patient has no known allergies.   Social History   Socioeconomic History   Marital status: Single    Spouse name: Not on file  Number of children: Not on file   Years of education: Not on file   Highest education level: Not on file  Occupational History   Not on file  Tobacco Use   Smoking status: Former    Packs/day: 1.00    Years: 30.00    Pack years: 30.00    Types: Cigarettes    Quit date: 03/26/2021    Years since quitting: 0.3   Smokeless tobacco: Never  Substance and Sexual Activity   Alcohol use: Yes   Drug use: Yes    Types: Marijuana    Comment: quit approx. 5 years ago   Sexual activity: Not on file  Other Topics Concern   Not on file  Social History Narrative   Lives by himself. Brother Frederick "[place] stay with pt. If needed    Social  Determinants of Health   Financial Resource Strain: Not on file  Food Insecurity: Not on file  Transportation Needs: Not on file  Physical Activity: Not on file  Stress: Not on file  Social Connections: Not on file     Family History: The patient's family history includes Dementia in his father; Lung disease in his mother.  ROS:   Please see the history of present illness.     All other systems reviewed and are negative.  EKGs/Labs/Other Studies Reviewed:    The following studies were reviewed today:   EKG:  EKG is  ordered today.  The ekg ordered today demonstrates sinus rhythm with PVCs.  Recent Labs: 06/13/2021: B Natriuretic Peptide 23.1 07/01/2021: Magnesium 2.1 07/07/2021: ALT 44; BUN 24; Creatinine, Ser 0.70; Hemoglobin 13.4; Platelets 268; Potassium 3.7; Sodium 136  Recent Lipid Panel No results found for: CHOL, TRIG, HDL, CHOLHDL, VLDL, LDLCALC, LDLDIRECT   Risk Assessment/Calculations:        Physical Exam:    VS:  BP 100/62 (BP Location: Right Arm, Patient Position: Sitting, Cuff Size: Normal)   Pulse 91   Ht 5\' 9"  (1.753 m)   Wt 198 lb (89.8 kg)   SpO2 97%   BMI 29.24 kg/m     Wt Readings from Last 3 Encounters:  07/14/21 198 lb (89.8 kg)  07/07/21 195 lb 14.4 oz (88.9 kg)  07/02/21 200 lb 2.8 oz (90.8 kg)     GEN: Moderate respiratory distress, with nasal cannula HEENT: Normal NECK: No JVD; No carotid bruits LYMPHATICS: No lymphadenopathy CARDIAC: Regular rate and rhythm, distant heart sounds RESPIRATORY: Poor inspiratory effort, diminished breath sounds ABDOMEN: Soft, PEG tube noted MUSCULOSKELETAL: 2-3+ pitting edema SKIN: Warm and dry NEUROLOGIC:  Alert and oriented x 3 PSYCHIATRIC:  Normal affect   ASSESSMENT:    1. Leg edema   2. Grade I diastolic dysfunction   3. SOB (shortness of breath)    PLAN:    In order of problems listed above:  Leg edema, component of dependent.  Echo with normal EF, grade 1 diastolic dysfunction.   Discussed possibility of increasing Lasix dose or switching to torsemide.  Patient would like to continue Lasix at current dose, states he will be more compliant. Grade 1 diastolic dysfunction, Lasix as above. Shortness of breath, etiology includes COPD, metastatic lung cancer, deconditioning.  Follow-up in 3 months.    Medication Adjustments/Labs and Tests Ordered: Current medicines are reviewed at length with the patient today.  Concerns regarding medicines are outlined above.  Orders Placed This Encounter  Procedures   EKG 12-Lead   No orders of the defined types were placed in this encounter.  Patient Instructions  Medication Instructions:  Your physician recommends that you continue on your current medications as directed. Please refer to the Current Medication list given to you today.  *If you need a refill on your cardiac medications before your next appointment, please call your pharmacy*   Lab Work: None ordered If you have labs (blood work) drawn today and your tests are completely normal, you will receive your results only by: [place] (if you have MyChart) OR A paper copy in the mail If you have any lab test that is abnormal or we need to change your treatment, we will call you to review the results.   Testing/Procedures: None ordered   Follow-Up: At [facility], you and your health needs are our priority.  As part of our continuing mission to provide you with exceptional heart care, we have created designated Provider Care Teams.  These Care Teams include your primary Cardiologist (physician) and Advanced Practice Providers (APPs -  Physician Assistants and Nurse Practitioners) who all work together to provide you with the care you need, when you need it.  We recommend signing up for the patient portal called "MyChart".  Sign up information is provided on this After Visit Summary.  MyChart is used to connect with patients for Virtual Visits  (Telemedicine).  Patients are able to view lab/test results, encounter notes, upcoming appointments, etc.  Non-urgent messages can be sent to your provider as well.   To learn more about what you can do with MyChart, go to [email or web address].    Your next appointment:   3 month(s)  The format for your next appointment:   In Person  Provider:   Kate Sable, MD   Other Instructions    Signed, Kate Sable, MD  07/14/2021 12:49 PM    [place]

## 2021-07-14 NOTE — Progress Notes (Signed)
[place]  [phone] [phone]  Patient Care Team: Pcp, No as PCP - General Kate Sable, MD as PCP - Cardiology (Cardiology) Telford Nab, RN as Oncology Nurse Navigator   Name of the patient: Derek Clarke  [id number]  [birth year]   Date of visit: 07/14/21  HPI: Patient is a 65 y.o. male with newly diagnosed metastatic NSCLC. 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 His treatment will now be changed to targeted therapy with Tagrisso (osimertinib).  Reason for Consult: Oral chemotherapy follow-up for osimertinib therapy.   PAST MEDICAL HISTORY: Past Medical History:  Diagnosis Date   Adenocarcinoma ([place])    lung; stage 4 with mets to liver   Chronic respiratory failure with hypoxia (HCC)    baseline 4 L [place]   COPD (chronic obstructive pulmonary disease) (HCC)    GAD (generalized anxiety disorder)     HEMATOLOGY/ONCOLOGY HISTORY:  Oncology History  Adenocarcinoma of left lung ([place])  05/26/2021 Initial Diagnosis   Adenocarcinoma of left lung ([place])   05/27/2021 Cancer Staging   Staging form: Lung, AJCC 8th Edition - Clinical stage from 05/27/2021: Stage IVB (cTX, cN3, pM1c) - Signed by Lloyd Huger, MD on 05/27/2021   06/16/2021 -  Chemotherapy    Patient is on Treatment Plan: LUNG NSCLC CARBOPLATIN + PACLITAXEL + BEVACIZUMAB Q21D X 1 CYCLE         ALLERGIES:  has No Known Allergies.  MEDICATIONS:  Current Outpatient Medications  Medication Sig Dispense Refill   albuterol (VENTOLIN HFA) 108 (90 Base) MCG/ACT inhaler Inhale 2 puffs into the lungs every 6 (six) hours as needed for wheezing or shortness of breath. 8 g 2   ALPRAZolam (XANAX) 0.5 MG tablet Place 1 tablet (0.5 mg total) into feeding tube 2 (two) times daily as needed for anxiety. 60 tablet 0   busPIRone (BUSPAR) 5 MG  tablet Take 1 tablet (5 mg total) by mouth 3 (three) times daily. 90 tablet 1   furosemide (LASIX) 40 MG tablet Place 1 tablet (40 mg total) into feeding tube daily. 30 tablet 0   guaiFENesin (ROBITUSSIN) 100 MG/5ML SOLN Place 10 mLs (200 mg total) into feeding tube every 4 (four) hours as needed for cough or to loosen phlegm. 236 mL 0   Multiple Vitamin (MULTIVITAMIN WITH MINERALS) TABS tablet Place 1 tablet into feeding tube daily. 90 tablet 0   Nutritional Supplements (FEEDING SUPPLEMENT, OSMOLITE 1.5 CAL,) LIQD Place 237 mLs into feeding tube 6 (six) times daily.  0   Nutritional Supplements (FEEDING SUPPLEMENT, PROSOURCE TF,) liquid Place 45 mLs into feeding tube 3 (three) times daily.     osimertinib mesylate (TAGRISSO) 80 MG tablet Take 1 tablet (80 mg total) by mouth daily. 30 tablet 2   potassium chloride (KLOR-CON) 20 MEQ packet Place 20 mEq into feeding tube daily. 30 packet 0   Water For Irrigation, Sterile (FREE WATER) SOLN Place 120 mLs into feeding tube 6 (six) times daily.     No current facility-administered medications for this visit.    VITAL SIGNS: There were no vitals taken for this visit. There were no vitals filed for this visit.  Estimated body mass index is 28.94 kg/m as calculated from the following:   Height as of an earlier encounter on 07/14/21: 5' 9" (1.753 m).   Weight as of an earlier encounter on 07/14/21: 88.9 kg (196  lb).  LABS: CBC:    Component Value Date/Time   WBC 9.8 07/14/2021 1236   HGB 14.0 07/14/2021 1236   HCT 42.4 07/14/2021 1236   PLT 153 07/14/2021 1236   MCV 85.3 07/14/2021 1236   NEUTROABS 7.7 07/14/2021 1236   LYMPHSABS 1.1 07/14/2021 1236   MONOABS 0.8 07/14/2021 1236   EOSABS 0.0 07/14/2021 1236   BASOSABS 0.0 07/14/2021 1236   Comprehensive Metabolic Panel:    Component Value Date/Time   NA 134 (L) 07/14/2021 1236   K 3.7 07/14/2021 1236   CL 92 (L) 07/14/2021 1236   CO2 34 (H) 07/14/2021 1236   BUN 17 07/14/2021 1236    CREATININE 0.66 07/14/2021 1236   GLUCOSE 144 (H) 07/14/2021 1236   CALCIUM 9.5 07/14/2021 1236   AST 22 07/14/2021 1236   ALT 25 07/14/2021 1236   ALKPHOS 95 07/14/2021 1236   BILITOT 0.7 07/14/2021 1236   PROT 6.8 07/14/2021 1236   ALBUMIN 3.7 07/14/2021 1236     Present during today's visit: patient only  Assessment and Plan: Continue osimertinib 67m   Oral Chemotherapy Side Effect/Intolerance:  No reported rash, diarrhea, and he is feeling less fatigued than before starting the osimertinib  Oral Chemotherapy Adherence: patient reports missing one dose No patient barriers to medication adherence identified. He states that he does not trouble dissolving and administering the osimertinib tablets  New medications: none reported  Medication Access Issues: pending manufacturer assistance  Patient expressed understanding and was in agreement with this plan. He also understands that He can call clinic at any time with any questions, concerns, or complaints.   Follow-up plan: RCT in 2 weeks  Thank you for allowing me to participate in the care of this very pleasant patient.   Time Total: 15 mins  Visit consisted of counseling and education on dealing with issues of symptom management in the setting of serious and potentially life-threatening illness.Greater than 50%  of this time was spent counseling and coordinating care related to the above assessment and plan.  Signed by: ADarl Pikes PharmD, BCPS, BSalley Slaughter CPP Hematology/Oncology Clinical Pharmacist Practitioner [phone] 07/14/2021 4:39 PM

## 2021-07-15 ENCOUNTER — Encounter: Payer: Self-pay | Admitting: Oncology

## 2021-07-15 ENCOUNTER — Encounter: Payer: Self-pay | Admitting: Pulmonary Disease

## 2021-07-15 NOTE — Progress Notes (Signed)
Met with patient during follow up visit. Pt met with multiple providers today including social work, nutrition, oral chemo pharmacist, and oncologist. Assisted pt throughout the clinic to help with tube feeding and ensure oxygen requirements were met. With assistance from Lennox Grumbles, RN, pt's anchor sutures were removed around PEG tube since have been in place for >2 weeks. Pt tolerated suture removal well. New dressing applied which was clean, dry, and intact prior to leaving clinic. Encouraged pt to ensure he has adequate nutrition and hydration daily through PEG tube to promote better treatment tolerance. Reviewed upcoming appts with patient and informed that transportation services will pick him up for his next appt. Instructed pt to call back with any questions or needs. Pt verbalized understanding.

## 2021-07-18 ENCOUNTER — Other Ambulatory Visit (HOSPITAL_COMMUNITY): Payer: Self-pay

## 2021-07-18 NOTE — Telephone Encounter (Signed)
Oral Oncology Patient Advocate Encounter  Received fax notification from [employer] that patient was denied assistance due to Tagrisso being covered by patients insurance.  I called [employer] to ask about the denial because patient is currently uninsured.  Since patient has a low income, [employer] requires the patient to apply for low income insurance like Medicaid.  If denied coverage, we would need to send the denial letter to [employer] so they can re-determine his eligibility.    Rep from [employer] was able to approve patient temporarily, while pending insurance determination, for 2 fills of Tagrisso.  Patient will received first shipment of Tagrisso in 3-5 business days by [employer] or [employer].   Emailed Gwinda Maine, social worker, who has been working with the patient to apply for [place] and Medicaid benefits.  She will let me know if the patient is approved or denied for Medicaid or VA benefits.  [place] Patient [place] Phone [phone] Fax [phone] 07/18/2021 10:36 AM

## 2021-07-21 ENCOUNTER — Inpatient Hospital Stay: Payer: Self-pay

## 2021-07-21 NOTE — Progress Notes (Signed)
Nutrition Follow-up:  Patient with stage IV adenocarcinoma of lung with liver mets.  PEG placed on 10/3 during recent hospitalization.   Spoke with patient via phone.  When asked how many cartons of tube feeding has been been giving a day he states "quite a few".  Asked patient to clarify and he says maybe 5 or 6.  "I loose track."  Thinks he gave 5 cartons yesterday.  Says that he has not giving 2 cartons at one feeding but has given 1 carton and part of another one.  Says he flushes with 1 syringe of water before and after and sometimes gives water in the formula.  Unsure how much.  Can't tell RD how much water he is giving between feedings, "maybe some."  Says that he is having a bowel movement about q 2 days, normal.  Says that he is urinating frequently.  No nausea.  Says that he has given some of the protein modular (prosource).  When asked how much he says maybe 10 or 20. Can't tell RD how often he has given it via feeding tube.     Estimated Energy Needs  Kcals: 2225-2670 Protein: 110-130 g Fluid: 2.2 L  NUTRITION DIAGNOSIS:  Inadequate oral intake relying on feeding tube for nutrition   INTERVENTION:  Patient to give 1 1/2 cartons of osmolite 1.5, QID.  Flush with 51ml of water before and after each feeding.  Add 240 ml water flush BID between feedings for better hydration. Written instructions have been provided to patient on past visits.   Will plan to educate patient on how to use prosource at next visit.   RD has encouraged patient to write down time and amount of formula and water he gives via feeding tube and bring with him to nutrition visits.     MONITORING, EVALUATION, GOAL: weight trends, tube feeding   NEXT VISIT: Thursday, Nov 3rd in clinic  [place]. Zenia Resides, [place], [place] Registered Dietitian [phone] (mobile)

## 2021-07-22 ENCOUNTER — Inpatient Hospital Stay: Payer: Self-pay | Admitting: Primary Care

## 2021-07-22 NOTE — Progress Notes (Signed)
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  INTERVAL HISTORY: Patient returns to clinic today for further evaluation and continuation of Tagrisso.  He continues to have chronic weakness and fatigue as well as shortness of breath, but feels this is improving.  His difficulty swallowing and dysphagia has also improved.  Despite this, he still requires all of his nutrition via PEG tube.  He has no neurologic complaints.  He denies any recent fevers. He has no chest pain or hemoptysis.  He continues to have chronic cough and secretions.  He has no nausea, vomiting, constipation, or diarrhea.  He has no urinary complaints.  Patient offers no further specific complaints today.  REVIEW OF SYSTEMS:   Review of Systems  Constitutional:  Positive for malaise/fatigue. Negative for fever and weight loss.  Respiratory:  Positive for cough, sputum production and shortness of breath. Negative for hemoptysis.   Cardiovascular: Negative.  Negative for chest pain and leg swelling.  Gastrointestinal: Negative.  Negative for abdominal pain.  Genitourinary: Negative.  Negative for dysuria and hematuria.  Musculoskeletal: Negative.  Negative for back pain.  Skin: Negative.  Negative for rash.  Neurological:  Positive for weakness. Negative for dizziness, focal weakness and headaches.  Psychiatric/Behavioral: Negative.  The patient is not nervous/anxious.    As per HPI. Otherwise, a complete review of systems is negative.  PAST MEDICAL HISTORY: Past Medical History:  Diagnosis Date   Adenocarcinoma ([place])    lung; stage 4 with mets to liver   Chronic respiratory failure with hypoxia  (HCC)    baseline 4 L [place]   COPD (chronic obstructive pulmonary disease) (HCC)    GAD (generalized anxiety disorder)     PAST SURGICAL HISTORY: Past Surgical History:  Procedure Laterality Date   IR GASTROSTOMY TUBE MOD SED  06/28/2021   IR IMAGING GUIDED PORT INSERTION  06/03/2021    FAMILY HISTORY: Family History  Problem Relation Age of Onset   Lung disease Mother    Dementia Father     ADVANCED DIRECTIVES (Y/N):  N  HEALTH MAINTENANCE: Social History   Tobacco Use   Smoking status: Former    Packs/day: 1.00    Years: 30.00    Pack years: 30.00    Types: Cigarettes    Quit date: 03/26/2021    Years since quitting: 0.3   Smokeless tobacco: Never  Substance Use Topics   Alcohol use: Yes   Drug use: Yes    Types: Marijuana    Comment: quit approx. 5 years ago     Colonoscopy:  PAP:  Bone density:  Lipid panel:  No Known Allergies  Current Outpatient Medications  Medication Sig Dispense Refill   albuterol (VENTOLIN HFA) 108 (90 Base) MCG/ACT inhaler Inhale 2 puffs into the lungs every 6 (six) hours as needed for wheezing or shortness of breath. 8 g 2   busPIRone (BUSPAR) 5 MG tablet Take 1 tablet (5 mg total) by mouth 3 (three) times daily. 90 tablet 1   furosemide (LASIX) 40 MG tablet Place 1 tablet (40 mg total) into feeding tube daily. 30 tablet 0   guaiFENesin (ROBITUSSIN) 100 MG/5ML SOLN Place 10 mLs (200 mg total) into feeding tube every 4 (four)  hours as needed for cough or to loosen phlegm. 236 mL 0   Multiple Vitamin (MULTIVITAMIN WITH MINERALS) TABS tablet Place 1 tablet into feeding tube daily. 90 tablet 0   Nutritional Supplements (FEEDING SUPPLEMENT, OSMOLITE 1.5 CAL,) LIQD Place 237 mLs into feeding tube 6 (six) times daily.  0   Nutritional Supplements (FEEDING SUPPLEMENT, PROSOURCE TF,) liquid Place 45 mLs into feeding tube 3 (three) times daily.     osimertinib mesylate (TAGRISSO) 80 MG tablet Take 1 tablet (80 mg total) by mouth daily. 30 tablet 2    potassium chloride (KLOR-CON) 20 MEQ packet Place 20 mEq into feeding tube daily. 30 packet 0   Water For Irrigation, Sterile (FREE WATER) SOLN Place 120 mLs into feeding tube 6 (six) times daily.     ALPRAZolam (XANAX) 0.5 MG tablet Place 1 tablet (0.5 mg total) into feeding tube 2 (two) times daily as needed for anxiety. 60 tablet 0   No current facility-administered medications for this visit.    OBJECTIVE: Vitals:   07/28/21 1048  BP: (!) 86/70  Pulse: 82  Resp: (!) 24  Temp: (!) 96.9 F (36.1 C)  SpO2: 100%     There is no height or weight on file to calculate BMI.    ECOG FS:2 - Symptomatic, <50% confined to bed  General: Well-developed, well-nourished, no acute distress. Eyes: Pink conjunctiva, anicteric sclera. HEENT: Normocephalic, moist mucous membranes. Lungs: No audible wheezing or coughing. Heart: Regular rate and rhythm. Abdomen: Soft, nontender, no obvious distention. Musculoskeletal: No edema, cyanosis, or clubbing. Neuro: Alert, answering all questions appropriately. Cranial nerves grossly intact. Skin: No rashes or petechiae noted. Psych: Normal affect.  LAB RESULTS:  Lab Results  Component Value Date   NA 135 07/28/2021   K 3.6 07/28/2021   CL 95 (L) 07/28/2021   CO2 33 (H) 07/28/2021   GLUCOSE 99 07/28/2021   BUN 16 07/28/2021   CREATININE 0.61 07/28/2021   CALCIUM 9.0 07/28/2021   PROT 6.6 07/28/2021   ALBUMIN 3.5 07/28/2021   AST 19 07/28/2021   ALT 13 07/28/2021   ALKPHOS 83 07/28/2021   BILITOT 0.6 07/28/2021   GFRNONAA >60 07/28/2021    Lab Results  Component Value Date   WBC 6.5 07/28/2021   NEUTROABS 4.7 07/28/2021   HGB 11.7 (L) 07/28/2021   HCT 36.3 (L) 07/28/2021   MCV 87.1 07/28/2021   PLT 144 (L) 07/28/2021     STUDIES: DG Abd 1 View  Result Date: 06/30/2021 CLINICAL DATA:  History of adenocarcinoma EXAM: ABDOMEN - 1 VIEW COMPARISON:  X-ray abdomen dated June 29, 2021 FINDINGS: Free intraperitoneal air, decreased  when compared with prior radiograph. Nonobstructive bowel gas pattern. Moderate stool burden in the right colon. Gastrostomy tube in place. New heterogeneous opacities of the left lower lung. IMPRESSION: Decreased free intraperitoneal air, likely postprocedural and related to gastrostomy tube placement. New heterogeneous opacities of the left lower lung, concerning for infection or aspiration. Recommend dedicated chest x-ray for better evaluation. Electronically Signed   By: Yetta Glassman M.D.   On: 06/30/2021 10:56   DG Abd 1 View  Result Date: 06/29/2021 CLINICAL DATA:  Gastrostomy tube complication EXAM: ABDOMEN - 1 VIEW COMPARISON:  06/28/2021 FINDINGS: Lung bases clear. Free air again identified under the LEFT hemidiaphragm. Gastrostomy tube LEFT upper quadrant. Stool in colon with small amount of contrast in small bowel loop mid abdomens in LEFT abdomen. Few scattered sclerotic foci in the pelvis and proximal LEFT femur, patient with known osseous  metastases. IMPRESSION: Persistent free intraperitoneal air under the LEFT hemidiaphragm. Electronically Signed   By: Lavonia Dana M.D.   On: 06/29/2021 17:52   DG Abd 1 View  Result Date: 06/28/2021 CLINICAL DATA:  Abdomen pain recent procedure EXAM: ABDOMEN - 1 VIEW COMPARISON:  06/28/2021, 06/23/2021 FINDINGS: Trace free air beneath the right diaphragm with small moderate volume free air beneath the left diaphragm. Patient is status post percutaneous gastrostomy. There is a nonobstructed gas pattern. IMPRESSION: Small moderate free air within the abdomen, potentially related to recent percutaneous gastrostomy tube placement. Consider serial radiographic follow-up to ensure resolution/decreasing free air. These results will be called to the ordering clinician or representative by the Radiologist Assistant, and communication documented in the PACS or [employer]. Electronically Signed   By: Donavan Foil M.D.   On: 06/28/2021 22:51   IR GASTROSTOMY  TUBE MOD SED  Result Date: 06/28/2021 INDICATION: Cancer, dysphagia with malnutrition EXAM: Placement of percutaneous gastrostomy tube using fluoroscopic guidance MEDICATIONS: Glucagon 1 mg IV ANESTHESIA/SEDATION: Sedation provided by the Department of Anesthesiology. Refer to separate anesthesia records. CONTRAST:  20 mL-administered into the gastric lumen. FLUOROSCOPY TIME:  Fluoroscopy Time: 2 minutes 30 seconds (35 mGy). COMPLICATIONS: None immediate. PROCEDURE: Informed written consent was obtained from the patient after a thorough discussion of the procedural risks, benefits and alternatives. All questions were addressed. Maximal Sterile Barrier Technique was utilized including caps, mask, sterile gowns, sterile gloves, sterile drape, hand hygiene and skin antiseptic. A timeout was performed prior to the initiation of the procedure. Review of pre-procedure CT scan demonstrates an adequate window for percutaneous placement of a gastrostomy tube. The patient was placed supine on the exam table. An angled 5 French catheter was passed through the nares into the stomach by the anesthesia team. It was secured to the nose, and the stomach was insufflated with air. The abdomen was prepped and draped in the standard sterile fashion. After insufflating the stomach with air, puncture sites were selected and local analgesia was obtained with 1% lidocaine. Using fluoroscopic guidance, a gastropexy needle was advanced into the stomach and the T-bar suture was released. Entry into the stomach was confirmed with fluoroscopy, aspiration of air, and injection of contrast material. This was repeated with an additional gastropexy suture (for a total of 3 fasteners). At the center of these gastropexy sutures, a dermatotomy was performed. An 18 gauge needle was then passed into the stomach, and position within the gastric lumen again confirmed under fluoroscopy using aspiration of air and injection of contrast material. An  Amplatz guidewire was passed through this needle and intraluminal placement was confirmed with fluoroscopy. The needle was removed, and over the guidewire, a 20 French balloon gastrostomy tube with a coaxial 10 mm balloon was advanced into the percutaneous tract. Dilation of the percutaneous tract was then performed using the balloon, followed by advancement of the gastrostomy tube into the gastric lumen. The retention balloon was then inflated with 19 mL of sterile water, and the tube was brought back to the gastric wall. The wire and balloon were removed. The external bumper was brought to the skin. Location of the gastrostomy tube within the stomach was then confirmed with injection of contrast material, opacifying the gastric lumen. The gastrostomy tube was flushed with sterile water, and secured to the skin using a dressing. The patient tolerated the procedure well without immediate complication, and was transferred to recovery in stable condition. IMPRESSION: Successful placement of a 20 French percutaneous gastrostomy tube using fluoroscopic  guidance. Electronically Signed   By: Albin Felling M.D.   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  EGFR positive.  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  MRI of the brain on May 18, 2021 negative for disease.  PET scan results from June 13, 2021 revealed widespread metastatic disease with innumerable bilateral lung and liver lesions as well as bony lesions.  Patient received 1 dose of carboplatinum, Taxol, and Avastin on June 16, 2021.  Subsequent to that treatment, OmniSec testing revealed EGFR mutation and patient was initiated on [place] on July 07, 2021.  Continue treatment until intolerable side effects or progression of disease.  Return to clinic in 4 weeks for repeat laboratory,  further evaluation, and continuation of treatment.  Patient will also initiate Zometa at that clinic appointment.  Appreciate clinical pharmacy and palliative care input.   Shortness of breath/oxygen requirement: Chronic and unchanged.  Continue follow-up with pulmonology as scheduled.  Continue oxygen supplementation as prescribed.  Patient has been instructed to get an at home pulse ox monitor. Tube feeding: Appreciate dietary input.  Continue as prescribed.   Leukocytosis: Resolved. Anemia: Mild, monitor.  I spent a total of 30 minutes reviewing chart data, face-to-face evaluation with the patient, counseling and coordination of care as detailed above.    Patient expressed understanding and was in agreement with this plan. He also understands that He can call clinic at any time with any questions, concerns, or complaints.   Cancer Staging Adenocarcinoma of left lung [facility]) Staging form: Lung, AJCC 8th Edition - Clinical stage from 05/27/2021: Stage IVB (cTX, cN3, pM1c) - Signed by Lloyd Huger, MD on 05/27/2021  Lloyd Huger, MD   07/28/2021 12:26 PM

## 2021-07-28 ENCOUNTER — Other Ambulatory Visit: Payer: Self-pay

## 2021-07-28 ENCOUNTER — Inpatient Hospital Stay: Payer: Medicaid Other | Admitting: Pharmacist

## 2021-07-28 ENCOUNTER — Inpatient Hospital Stay: Payer: Medicaid Other

## 2021-07-28 ENCOUNTER — Encounter: Payer: Self-pay | Admitting: *Deleted

## 2021-07-28 ENCOUNTER — Inpatient Hospital Stay (HOSPITAL_BASED_OUTPATIENT_CLINIC_OR_DEPARTMENT_OTHER): Payer: Medicaid Other | Admitting: Oncology

## 2021-07-28 ENCOUNTER — Inpatient Hospital Stay: Payer: Medicaid Other | Admitting: *Deleted

## 2021-07-28 ENCOUNTER — Other Ambulatory Visit: Payer: Self-pay | Admitting: Oncology

## 2021-07-28 ENCOUNTER — Inpatient Hospital Stay: Payer: Medicaid Other | Attending: Oncology

## 2021-07-28 VITALS — BP 86/70 | HR 82 | Temp 96.9°F | Resp 24

## 2021-07-28 DIAGNOSIS — C770 Secondary and unspecified malignant neoplasm of lymph nodes of head, face and neck: Secondary | ICD-10-CM | POA: Diagnosis not present

## 2021-07-28 DIAGNOSIS — C3490 Malignant neoplasm of unspecified part of unspecified bronchus or lung: Secondary | ICD-10-CM | POA: Insufficient documentation

## 2021-07-28 DIAGNOSIS — R0602 Shortness of breath: Secondary | ICD-10-CM | POA: Insufficient documentation

## 2021-07-28 DIAGNOSIS — D649 Anemia, unspecified: Secondary | ICD-10-CM | POA: Insufficient documentation

## 2021-07-28 DIAGNOSIS — C787 Secondary malignant neoplasm of liver and intrahepatic bile duct: Secondary | ICD-10-CM | POA: Insufficient documentation

## 2021-07-28 DIAGNOSIS — C3492 Malignant neoplasm of unspecified part of left bronchus or lung: Secondary | ICD-10-CM

## 2021-07-28 DIAGNOSIS — Z9981 Dependence on supplemental oxygen: Secondary | ICD-10-CM | POA: Diagnosis not present

## 2021-07-28 LAB — CBC WITH DIFFERENTIAL/PLATELET
Abs Immature Granulocytes: 0.02 10*3/uL (ref 0.00–0.07)
Basophils Absolute: 0 10*3/uL (ref 0.0–0.1)
Basophils Relative: 0 %
Eosinophils Absolute: 0.1 10*3/uL (ref 0.0–0.5)
Eosinophils Relative: 2 %
HCT: 36.3 % — ABNORMAL LOW (ref 39.0–52.0)
Hemoglobin: 11.7 g/dL — ABNORMAL LOW (ref 13.0–17.0)
Immature Granulocytes: 0 %
Lymphocytes Relative: 13 %
Lymphs Abs: 0.9 10*3/uL (ref 0.7–4.0)
MCH: 28.1 pg (ref 26.0–34.0)
MCHC: 32.2 g/dL (ref 30.0–36.0)
MCV: 87.1 fL (ref 80.0–100.0)
Monocytes Absolute: 0.8 10*3/uL (ref 0.1–1.0)
Monocytes Relative: 12 %
Neutro Abs: 4.7 10*3/uL (ref 1.7–7.7)
Neutrophils Relative %: 73 %
Platelets: 144 10*3/uL — ABNORMAL LOW (ref 150–400)
RBC: 4.17 MIL/uL — ABNORMAL LOW (ref 4.22–5.81)
RDW: 15.5 % (ref 11.5–15.5)
WBC: 6.5 10*3/uL (ref 4.0–10.5)
nRBC: 0 % (ref 0.0–0.2)

## 2021-07-28 LAB — COMPREHENSIVE METABOLIC PANEL
ALT: 13 U/L (ref 0–44)
AST: 19 U/L (ref 15–41)
Albumin: 3.5 g/dL (ref 3.5–5.0)
Alkaline Phosphatase: 83 U/L (ref 38–126)
Anion gap: 7 (ref 5–15)
BUN: 16 mg/dL (ref 8–23)
CO2: 33 mmol/L — ABNORMAL HIGH (ref 22–32)
Calcium: 9 mg/dL (ref 8.9–10.3)
Chloride: 95 mmol/L — ABNORMAL LOW (ref 98–111)
Creatinine, Ser: 0.61 mg/dL (ref 0.61–1.24)
GFR, Estimated: >60 mL/min (ref >60)
Glucose, Bld: 99 mg/dL (ref 70–99)
Potassium: 3.6 mmol/L (ref 3.5–5.1)
Sodium: 135 mmol/L (ref 135–145)
Total Bilirubin: 0.6 mg/dL (ref 0.3–1.2)
Total Protein: 6.6 g/dL (ref 6.5–8.1)

## 2021-07-28 MED ORDER — ALPRAZOLAM 0.5 MG PO TABS: 0.5000 mg | ORAL_TABLET | Freq: Two times a day (BID) | ORAL | 0 refills | Status: DC | PRN

## 2021-07-28 NOTE — Progress Notes (Signed)
Pt requesting refill on xanax. No other concerns or complaints at home.

## 2021-07-28 NOTE — Progress Notes (Signed)
Met with patient during follow up visit. All questions answered during visit. Reviewed upcoming appts. Instructed pt to call with any questions or needs. Pt verbalized understanding. Nothing further needed at this time.

## 2021-07-28 NOTE — Progress Notes (Signed)
Oral Chemotherapy Clinic [facility]  Telephone:([phone] Fax:(336) [phone]  Patient Care Team: Pcp, No as PCP - General Debbe Odea, MD as PCP - Cardiology (Cardiology) Glory Buff, RN as Oncology Nurse Navigator   Name of the patient: Derek Clarke  [id number]  04-15-1956   Date of visit: 07/28/21  HPI: Patient is a 65 y.o. male with newly diagnosed metastatic NSCLC. 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 His treatment has new been changed to targeted therapy with Tagrisso (osimertinib), started 07/08/21.  Reason for Consult: Oral chemotherapy follow-up for osimertinib therapy.   PAST MEDICAL HISTORY: Past Medical History:  Diagnosis Date   Adenocarcinoma (HCC)    lung; stage 4 with mets to liver   Chronic respiratory failure with hypoxia (HCC)    baseline 4 L [place]   COPD (chronic obstructive pulmonary disease) (HCC)    GAD (generalized anxiety disorder)     HEMATOLOGY/ONCOLOGY HISTORY:  Oncology History  Adenocarcinoma of left lung (HCC)  05/26/2021 Initial Diagnosis   Adenocarcinoma of left lung (HCC)   05/27/2021 Cancer Staging   Staging form: Lung, AJCC 8th Edition - Clinical stage from 05/27/2021: Stage IVB (cTX, cN3, pM1c) - Signed by Jeralyn Ruths, MD on 05/27/2021    06/16/2021 -  Chemotherapy    Patient is on Treatment Plan: LUNG NSCLC CARBOPLATIN + PACLITAXEL + BEVACIZUMAB Q21D X 1 CYCLE         ALLERGIES:  has No Known Allergies.  MEDICATIONS:  Current Outpatient Medications  Medication Sig Dispense Refill   albuterol (VENTOLIN HFA) 108 (90 Base) MCG/ACT inhaler Inhale 2 puffs into the lungs every 6 (six) hours as needed for wheezing or shortness of breath. 8 g 2   ALPRAZolam (XANAX) 0.5 MG tablet Place 1 tablet (0.5 mg total) into feeding tube 2 (two) times daily as needed for anxiety. 60 tablet 0    busPIRone (BUSPAR) 5 MG tablet Take 1 tablet (5 mg total) by mouth 3 (three) times daily. 90 tablet 1   furosemide (LASIX) 40 MG tablet Place 1 tablet (40 mg total) into feeding tube daily. 30 tablet 0   guaiFENesin (ROBITUSSIN) 100 MG/5ML SOLN Place 10 mLs (200 mg total) into feeding tube every 4 (four) hours as needed for cough or to loosen phlegm. 236 mL 0   Multiple Vitamin (MULTIVITAMIN WITH MINERALS) TABS tablet Place 1 tablet into feeding tube daily. 90 tablet 0   Nutritional Supplements (FEEDING SUPPLEMENT, OSMOLITE 1.5 CAL,) LIQD Place 237 mLs into feeding tube 6 (six) times daily.  0   Nutritional Supplements (FEEDING SUPPLEMENT, PROSOURCE TF,) liquid Place 45 mLs into feeding tube 3 (three) times daily.     osimertinib mesylate (TAGRISSO) 80 MG tablet Take 1 tablet (80 mg total) by mouth daily. 30 tablet 2   potassium chloride (KLOR-CON) 20 MEQ packet Place 20 mEq into feeding tube daily. 30 packet 0   Water For Irrigation, Sterile (FREE WATER) SOLN Place 120 mLs into feeding tube 6 (six) times daily.     No current facility-administered medications for this visit.    VITAL SIGNS: There were no vitals taken for this visit. There were no vitals filed for this visit.  Estimated body mass index is 28.94 kg/m as calculated from the following:   Height as of 07/14/21: 5\' 9"  (1.753 m).   Weight as of 07/14/21: 88.9 kg (196 lb).  LABS: CBC:  Component Value Date/Time   WBC 9.8 07/14/2021 1236   HGB 14.0 07/14/2021 1236   HCT 42.4 07/14/2021 1236   PLT 153 07/14/2021 1236   MCV 85.3 07/14/2021 1236   NEUTROABS 7.7 07/14/2021 1236   LYMPHSABS 1.1 07/14/2021 1236   MONOABS 0.8 07/14/2021 1236   EOSABS 0.0 07/14/2021 1236   BASOSABS 0.0 07/14/2021 1236   Comprehensive Metabolic Panel:    Component Value Date/Time   NA 134 (L) 07/14/2021 1236   K 3.7 07/14/2021 1236   CL 92 (L) 07/14/2021 1236   CO2 34 (H) 07/14/2021 1236   BUN 17 07/14/2021 1236   CREATININE 0.66  07/14/2021 1236   GLUCOSE 144 (H) 07/14/2021 1236   CALCIUM 9.5 07/14/2021 1236   AST 22 07/14/2021 1236   ALT 25 07/14/2021 1236   ALKPHOS 95 07/14/2021 1236   BILITOT 0.7 07/14/2021 1236   PROT 6.8 07/14/2021 1236   ALBUMIN 3.7 07/14/2021 1236     Present during today's visit: patient only  Assessment and Plan: Continue osimertinib 80mg  ECG from 06/13/21 (pre-osimertinib) showed a QTc of 490ms, cardiology office repeated at ECG onf 07/14/21 (~7 days post-osimertinib) and found the ECG has decreased to 476ms He occasionally will try and turn down his oxygen tank for ~52mins at a time and feels okay when the oxygen is lower. He plans to continue to test himself in this way He also reported that his throat feels "less stuck" when he swallows   Oral Chemotherapy Side Effect/Intolerance:  No reported rash, diarrhea, and he continues to feels less fatigued than before starting the osimertinib  Oral Chemotherapy Adherence: patient reports missing one dose since last seen No patient barriers to medication adherence identified. He states that he does not trouble dissolving and administering the osimertinib tablets  New medications: none reported  Medication Access Issues: He was temporary approved for 2 fills from manufacturer assistance, full approval pending determination of his Medicaid insurance status.  Patient expressed understanding and was in agreement with this plan. He also understands that He can call clinic at any time with any questions, concerns, or complaints.   Follow-up plan: RCT in 2 weeks in one month  Thank you for allowing me to participate in the care of this very pleasant patient.   Time Total: 15 mins  Visit consisted of counseling and education on dealing with issues of symptom management in the setting of serious and potentially life-threatening illness.Greater than 50%  of this time was spent counseling and coordinating care related to the above assessment and  plan.  Signed by: Darl Pikes, PharmD, BCPS, Salley Slaughter, CPP Hematology/Oncology Clinical Pharmacist Practitioner [facility]/HP/AP [facility] [phone]  07/28/2021 9:54 AM

## 2021-07-28 NOTE — Progress Notes (Signed)
Nutrition Follow-up:  Patient with stage IV adenocarcinoma of lung with liver mets.  PEG placed on 10/3 during recent hospitalization.  Patient receiving oral chemotherapy.    Met with patient during visit.  Patient reports that he is giving mostly 5, sometimes 6 cartons of formula daily.  "I haven't been writing it down like I should but I have labelled some of the cartons 1-6.  Sometimes I loose track."  Denies nausea, having  bowel movement every other day.  No constipation or diarrhea.  Flushes with 1 syringe of water before and after each feeding but can't tell RD if giving water between feedings.  Says that he has given some of the protein liquids 10-61ml mixed with some water.      Medications: reviewed  Labs: reviewed  Anthropometrics:   No weight recorded today  10/20 198 lb  10/8 200 lb.     Estimated Energy Needs  Kcals: [phone] Protein: 110-130 g Fluid: 2.2 L  NUTRITION DIAGNOSIS: Inadequate oral intake relying on feeding tube for nutrition   INTERVENTION:  Patient to give 1 1/2 cartons of osmolite 1.5, QID.  Flush with 59ml of water before and after each feeding. Patient to give prosource 44ml at one feeding (mix with 3ml of water). Flush with 69ml water before and after giving prosource.  Patient to give prosource 2 other times with water flush separate from feeding.  Written instructions on feeding regimen given to patient today with new instructions for prosource.  Patient was able to verbalize back to me feeding instructions, including water flush and mixing instructions.      MONITORING, EVALUATION, GOAL: weight trends, tube feeding    NEXT VISIT: Thursday, Nov 17 in clinic  [place]. Zenia Resides, [place], [place] Registered Dietitian [phone] (mobile)

## 2021-07-28 NOTE — Progress Notes (Signed)
[place] Work  Clinical Social Work met with patient after medical oncology appointment for check in visit.  Mr. Borum shared he is "doing well" at home.  He shared his brother has been visiting once a week and helpful with various tasks.  Patient was not able to obtain paperwork needed to complete Medicaid or VA benefits applications.  CSW stressed the importance of completing paperwork. CSW and patient agreed to meet again in two weeks to follow up on applications and complete.     Gwinda Maine, LCSW  Clinical Social Worker [facility]

## 2021-08-03 ENCOUNTER — Other Ambulatory Visit: Payer: Self-pay | Admitting: *Deleted

## 2021-08-03 ENCOUNTER — Other Ambulatory Visit: Payer: Self-pay | Admitting: Oncology

## 2021-08-03 DIAGNOSIS — C3492 Malignant neoplasm of unspecified part of left bronchus or lung: Secondary | ICD-10-CM

## 2021-08-03 MED ORDER — ALPRAZOLAM 0.5 MG PO TABS: 0.5000 mg | ORAL_TABLET | Freq: Two times a day (BID) | ORAL | 0 refills | Status: DC | PRN

## 2021-08-03 MED ORDER — ALPRAZOLAM 0.25 MG PO TABS: 0.2500 mg | ORAL_TABLET | Freq: Two times a day (BID) | ORAL | 0 refills | Status: DC | PRN

## 2021-08-08 ENCOUNTER — Other Ambulatory Visit: Payer: Self-pay | Admitting: Hospice

## 2021-08-09 ENCOUNTER — Other Ambulatory Visit: Payer: Self-pay

## 2021-08-11 ENCOUNTER — Inpatient Hospital Stay: Payer: Medicaid Other

## 2021-08-11 ENCOUNTER — Other Ambulatory Visit: Payer: Self-pay | Admitting: *Deleted

## 2021-08-11 ENCOUNTER — Inpatient Hospital Stay: Payer: Medicaid Other | Admitting: Hospice and Palliative Medicine

## 2021-08-11 ENCOUNTER — Telehealth: Payer: Self-pay | Admitting: Oncology

## 2021-08-11 NOTE — Progress Notes (Signed)
Nutrition Follow-up:  Patient with stage IV adenocarcinoma of lung with liver mets.  PEG placed on 10/3 during recent hospitalization.  Patient receiving oral chemotherapy.   Patient did not have transportation to appointments today.  RD was able to speak with patient via phone.  Patient reports that he has been out of his xanax but has recently had it refilled but it is half strength of normal dose.  He says that he has some days taken 5-6 cartons of tube feeding and other days 4 cartons.  "I am working on it."  He has taken mostly 25ml of protein (goal is 34ml TID of prosource).  Denies nausea, having bowel movements without issues.  Says that he has formula and supplies.    Medications: reviewed  Labs: reviewed  Anthropometrics:   No new weight   Estimated Energy Needs  Kcals: 2225-2670 Protein: 110-130 g Fluid: 2.2 L  NUTRITION DIAGNOSIS: Inadequate oral intake continues but relying on feeding tube for nutrition   INTERVENTION:  Secure chat sent to MD Jolee Ewing, NP and RN regarding patient concern regarding xanax dosing.   Reviewed with patient tube feeding goals of 1 1/2 cartons of osmolite 1.5 QID and 1ml of prosource TID via feeding tube.  RD has provided handwritten instructions including water flush for patient on past visit.      MONITORING, EVALUATION, GOAL: weight trends, tube feeding tolerance   NEXT VISIT:  Tuesday, Dec 6 in clinic  [place]. Zenia Resides, [place], [place] Registered Dietitian [phone] (mobile)

## 2021-08-11 NOTE — Telephone Encounter (Signed)
Pt called to state that his did not show up to bring him to his appt. Please give him a call to reschedule at [phone]

## 2021-08-12 ENCOUNTER — Other Ambulatory Visit: Payer: Self-pay | Admitting: *Deleted

## 2021-08-12 MED ORDER — ALPRAZOLAM 0.5 MG PO TABS: 0.5000 mg | ORAL_TABLET | Freq: Two times a day (BID) | ORAL | 0 refills | Status: DC | PRN

## 2021-08-26 NOTE — Progress Notes (Signed)
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  INTERVAL HISTORY: Patient returns to clinic today for further evaluation and continuation of Tagrisso.  He admits to being not completely compliant with treatment missing several doses per week.  He continues to have chronic weakness and fatigue, anxiety, and shortness of breath.  His difficulty swallowing and dysphagia are unchanged as well.  He has no neurologic complaints.  He denies any recent fevers. He has no chest pain or hemoptysis.  He continues to have chronic cough and secretions.  He has no nausea, vomiting, constipation, or diarrhea.  He has no urinary complaints.  Patient has no further specific complaints today.  REVIEW OF SYSTEMS:   Review of Systems  Constitutional:  Positive for malaise/fatigue. Negative for fever and weight loss.  Respiratory:  Positive for cough, sputum production and shortness of breath. Negative for hemoptysis.   Cardiovascular: Negative.  Negative for chest pain and leg swelling.  Gastrointestinal: Negative.  Negative for abdominal pain.  Genitourinary: Negative.  Negative for dysuria and hematuria.  Musculoskeletal: Negative.  Negative for back pain.  Skin: Negative.  Negative for rash.  Neurological:  Positive for weakness. Negative for dizziness, focal weakness and headaches.  Psychiatric/Behavioral: Negative.  The patient is not nervous/anxious.    As per HPI. Otherwise, a complete review of systems is negative.  PAST MEDICAL HISTORY: Past Medical History:  Diagnosis Date   Adenocarcinoma ([place])    lung; stage 4 with mets to liver   Chronic respiratory failure with hypoxia  (HCC)    baseline 4 L [place]   COPD (chronic obstructive pulmonary disease) (HCC)    GAD (generalized anxiety disorder)     PAST SURGICAL HISTORY: Past Surgical History:  Procedure Laterality Date   IR GASTROSTOMY TUBE MOD SED  06/28/2021   IR IMAGING GUIDED PORT INSERTION  06/03/2021    FAMILY HISTORY: Family History  Problem Relation Age of Onset   Lung disease Mother    Dementia Father     ADVANCED DIRECTIVES (Y/N):  N  HEALTH MAINTENANCE: Social History   Tobacco Use   Smoking status: Former    Packs/day: 1.00    Years: 30.00    Pack years: 30.00    Types: Cigarettes    Quit date: 03/26/2021    Years since quitting: 0.4   Smokeless tobacco: Never  Substance Use Topics   Alcohol use: Yes   Drug use: Yes    Types: Marijuana    Comment: quit approx. 5 years ago     Colonoscopy:  PAP:  Bone density:  Lipid panel:  No Known Allergies  Current Outpatient Medications  Medication Sig Dispense Refill   albuterol (VENTOLIN HFA) 108 (90 Base) MCG/ACT inhaler Inhale 2 puffs into the lungs every 6 (six) hours as needed for wheezing or shortness of breath. 8 g 2   furosemide (LASIX) 40 MG tablet Place 1 tablet (40 mg total) into feeding tube daily. 30 tablet 0   Multiple Vitamin (MULTIVITAMIN WITH MINERALS) TABS tablet Place 1 tablet into feeding tube daily. 90 tablet 0   Nutritional Supplements (FEEDING SUPPLEMENT, OSMOLITE 1.5 CAL,) LIQD Place 237 mLs into feeding tube 6 (six) times daily.  0   Nutritional  Supplements (FEEDING SUPPLEMENT, PROSOURCE TF,) liquid Place 45 mLs into feeding tube 3 (three) times daily.     osimertinib mesylate (TAGRISSO) 80 MG tablet Take 1 tablet (80 mg total) by mouth daily. 30 tablet 2   Water For Irrigation, Sterile (FREE WATER) SOLN Place 120 mLs into feeding tube 6 (six) times daily.     ALPRAZolam (XANAX) 0.5 MG tablet Take 1 tablet (0.5 mg total) by mouth 2 (two) times daily as needed for anxiety. 60 tablet 0   busPIRone (BUSPAR) 5 MG  tablet Take 1 tablet (5 mg total) by mouth 3 (three) times daily. (Patient not taking: Reported on 08/30/2021) 90 tablet 1   guaiFENesin (ROBITUSSIN) 100 MG/5ML SOLN Place 10 mLs (200 mg total) into feeding tube every 4 (four) hours as needed for cough or to loosen phlegm. (Patient not taking: Reported on 08/30/2021) 236 mL 0   potassium chloride (KLOR-CON) 20 MEQ packet Place 20 mEq into feeding tube daily. 30 packet 0   No current facility-administered medications for this visit.    OBJECTIVE: Vitals:   08/30/21 1324  BP: 118/78  Pulse: 99  Resp: 16  Temp: (!) 96.4 F (35.8 C)  SpO2: 100%     Body mass index is 29.83 kg/m.    ECOG FS:2 - Symptomatic, <50% confined to bed  General: Ill-appearing, mild respiratory distress. Eyes: Pink conjunctiva, anicteric sclera. HEENT: Normocephalic, moist mucous membranes. Lungs: No audible wheezing or coughing. Heart: Regular rate and rhythm. Abdomen: Soft, nontender, no obvious distention. Musculoskeletal: No edema, cyanosis, or clubbing.  PEG tube in place. Neuro: Alert, answering all questions appropriately. Cranial nerves grossly intact. Skin: No rashes or petechiae noted. Psych: Normal affect.   LAB RESULTS:  Lab Results  Component Value Date   NA 135 08/30/2021   K 3.7 08/30/2021   CL 94 (L) 08/30/2021   CO2 30 08/30/2021   GLUCOSE 96 08/30/2021   BUN 18 08/30/2021   CREATININE 0.66 08/30/2021   CALCIUM 9.6 08/30/2021   PROT 6.7 08/30/2021   ALBUMIN 3.5 08/30/2021   AST 17 08/30/2021   ALT 10 08/30/2021   ALKPHOS 73 08/30/2021   BILITOT 0.9 08/30/2021   GFRNONAA >60 08/30/2021    Lab Results  Component Value Date   WBC 6.2 08/30/2021   NEUTROABS 4.5 08/30/2021   HGB 11.5 (L) 08/30/2021   HCT 36.1 (L) 08/30/2021   MCV 88.7 08/30/2021   PLT 158 08/30/2021     STUDIES: No results found.  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  EGFR positive.  PLAN:    Stage IVB adenocarcinoma  of the lung with liver metastasis: Biopsy of supraclavicular lymph node on May 19, 2021 confirmed diagnosis.  MRI of the brain on May 18, 2021 negative for disease.  PET scan results from June 13, 2021 revealed widespread metastatic disease with innumerable bilateral lung and liver lesions as well as bony lesions.  Patient received 1 dose of carboplatinum, Taxol, and Avastin on June 16, 2021.  Subsequent to that treatment, OmniSec testing revealed EGFR mutation and patient was initiated on [place] on July 07, 2021.  Continue treatment until intolerable side effects or progression of disease.  Proceed with Zometa today.  Return to clinic in 4 weeks for repeat laboratory work, further evaluation, and continuation of treatment.  Appreciate clinical pharmacy, palliative care, and dietary input.   Shortness of breath/oxygen requirement: Chronic and unchanged.  Continue follow-up with pulmonology as scheduled.  Continue oxygen supplementation as prescribed.  Patient has been instructed to get an at home pulse ox monitor. Tube feeding: Appreciate dietary input.  Continue as prescribed.   Anxiety: Patient was given a prescription for Xanax today. Leukocytosis: Resolved. Anemia: Chronic and unchanged. 7.  Bony mets: Zometa as above.   Patient expressed understanding and was in agreement with this plan. He also understands that He can call clinic at any time with any questions, concerns, or complaints.    Cancer Staging  Adenocarcinoma of left lung [facility]) Staging form: Lung, AJCC 8th Edition - Clinical stage from 05/27/2021: Stage IVB (cTX, cN3, pM1c) - Signed by Lloyd Huger, MD on 05/27/2021  Lloyd Huger, MD   08/31/2021 9:28 AM

## 2021-08-29 NOTE — Progress Notes (Signed)
[place]  [phone] [phone]  Patient Care Team: Pcp, No as PCP - General Kate Sable, MD as PCP - Cardiology (Cardiology) Telford Nab, RN as Oncology Nurse Navigator   Name of the patient: Derek Clarke  [id number]  30-Jul-1956   Date of visit: 08/30/21  HPI: Patient is a 65 y.o. male with newly diagnosed metastatic NSCLC. 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 His treatment has new been changed to targeted therapy with Tagrisso (osimertinib), started 07/08/21.  Reason for Consult: Oral chemotherapy follow-up for osimertinib therapy.   PAST MEDICAL HISTORY: Past Medical History:  Diagnosis Date   Adenocarcinoma ([place])    lung; stage 4 with mets to liver   Chronic respiratory failure with hypoxia (HCC)    baseline 4 L [place]   COPD (chronic obstructive pulmonary disease) (HCC)    GAD (generalized anxiety disorder)     HEMATOLOGY/ONCOLOGY HISTORY:  Oncology History  Adenocarcinoma of left lung ([place])  05/26/2021 Initial Diagnosis   Adenocarcinoma of left lung ([place])   05/27/2021 Cancer Staging   Staging form: Lung, AJCC 8th Edition - Clinical stage from 05/27/2021: Stage IVB (cTX, cN3, pM1c) - Signed by Lloyd Huger, MD on 05/27/2021    06/16/2021 -  Chemotherapy    Patient is on Treatment Plan: LUNG NSCLC CARBOPLATIN + PACLITAXEL + BEVACIZUMAB Q21D X 1 CYCLE         ALLERGIES:  has No Known Allergies.  MEDICATIONS:  Current Outpatient Medications  Medication Sig Dispense Refill   albuterol (VENTOLIN HFA) 108 (90 Base) MCG/ACT inhaler Inhale 2 puffs into the lungs every 6 (six) hours as needed for wheezing or shortness of breath. 8 g 2   ALPRAZolam (XANAX) 0.5 MG tablet Take 1 tablet (0.5 mg total) by mouth 2 (two) times daily as needed for anxiety. 60 tablet 0   busPIRone (BUSPAR)  5 MG tablet Take 1 tablet (5 mg total) by mouth 3 (three) times daily. (Patient not taking: Reported on 08/30/2021) 90 tablet 1   furosemide (LASIX) 40 MG tablet Place 1 tablet (40 mg total) into feeding tube daily. 30 tablet 0   guaiFENesin (ROBITUSSIN) 100 MG/5ML SOLN Place 10 mLs (200 mg total) into feeding tube every 4 (four) hours as needed for cough or to loosen phlegm. (Patient not taking: Reported on 08/30/2021) 236 mL 0   Multiple Vitamin (MULTIVITAMIN WITH MINERALS) TABS tablet Place 1 tablet into feeding tube daily. 90 tablet 0   Nutritional Supplements (FEEDING SUPPLEMENT, OSMOLITE 1.5 CAL,) LIQD Place 237 mLs into feeding tube 6 (six) times daily.  0   Nutritional Supplements (FEEDING SUPPLEMENT, PROSOURCE TF,) liquid Place 45 mLs into feeding tube 3 (three) times daily.     osimertinib mesylate (TAGRISSO) 80 MG tablet Take 1 tablet (80 mg total) by mouth daily. 30 tablet 2   potassium chloride (KLOR-CON) 20 MEQ packet Place 20 mEq into feeding tube daily. 30 packet 0   Water For Irrigation, Sterile (FREE WATER) SOLN Place 120 mLs into feeding tube 6 (six) times daily.     No current facility-administered medications for this visit.   Facility-Administered Medications Ordered in Other Visits  Medication Dose Route Frequency Provider Last Rate Last Admin   sodium chloride flush (NS) 0.9 % injection 10 mL  10 mL Intravenous PRN Lloyd Huger, MD   10 mL at 08/30/21 1305  VITAL SIGNS: There were no vitals taken for this visit. There were no vitals filed for this visit.  Estimated body mass index is 29.83 kg/m as calculated from the following:   Height as of 07/14/21: _0  (1.753 m).   Weight as of an earlier encounter on 08/30/21: 91.6 kg (202 lb).  LABS: CBC:    Component Value Date/Time   WBC 6.2 08/30/2021 1305   HGB 11.5 (L) 08/30/2021 1305   HCT 36.1 (L) 08/30/2021 1305   PLT 158 08/30/2021 1305   MCV 88.7 08/30/2021 1305   NEUTROABS 4.5 08/30/2021 1305    LYMPHSABS 0.9 08/30/2021 1305   MONOABS 0.7 08/30/2021 1305   EOSABS 0.1 08/30/2021 1305   BASOSABS 0.0 08/30/2021 1305   Comprehensive Metabolic Panel:    Component Value Date/Time   NA 135 08/30/2021 1305   K 3.7 08/30/2021 1305   CL 94 (L) 08/30/2021 1305   CO2 30 08/30/2021 1305   BUN 18 08/30/2021 1305   CREATININE 0.66 08/30/2021 1305   GLUCOSE 96 08/30/2021 1305   CALCIUM 9.6 08/30/2021 1305   AST 17 08/30/2021 1305   ALT 10 08/30/2021 1305   ALKPHOS 73 08/30/2021 1305   BILITOT 0.9 08/30/2021 1305   PROT 6.7 08/30/2021 1305   ALBUMIN 3.5 08/30/2021 1305     Present during today's visit: patient only  Assessment and Plan: Continue osimertinib 9m He continues to occasionally turn down his oxygen tank to see how he feels on less oxygen.  He also reported that his swallowing has improved, he is able to take some medication by mouth now, this includes the osimertinib  Oral Chemotherapy Side Effect/Intolerance:  No reported rash or diarrhea Fatigue: slightly fatigued, he has not been sleeping well lately, some night 4-6 hours other night or 2-3 hours  Oral Chemotherapy Adherence: patient reports missing doses of osimertinib about every 3 days. He reports the he forgets due to concerns with his PEG tube. Reviewed the importance of adherence and ways to improve adherence.  New medications: none reported  Medication Access Issues: Patient has not given social work the paperwork they need to help him with his Medicaid application. His manufacturer assistance is dependent on his application. Asked Ms. DHallinanto bring in the needed documents for his application.  Patient expressed understanding and was in agreement with this plan. He also understands that He can call clinic at any time with any questions, concerns, or complaints.   Follow-up plan: RCT in 4 weeks  Thank you for allowing me to participate in the care of this very pleasant patient.   Time Total: 20  mins  Visit consisted of counseling and education on dealing with issues of symptom management in the setting of serious and potentially life-threatening illness.Greater than 50%  of this time was spent counseling and coordinating care related to the above assessment and plan.  Signed by: ADarl Pikes PharmD, BCPS, BSalley Slaughter CPP Hematology/Oncology Clinical Pharmacist Practitioner /HP/AP [phone] 08/30/2021 4:27 PM

## 2021-08-30 ENCOUNTER — Inpatient Hospital Stay: Payer: Medicaid Other

## 2021-08-30 ENCOUNTER — Inpatient Hospital Stay (HOSPITAL_BASED_OUTPATIENT_CLINIC_OR_DEPARTMENT_OTHER): Payer: Medicaid Other | Admitting: Oncology

## 2021-08-30 ENCOUNTER — Inpatient Hospital Stay: Payer: Medicaid Other | Attending: Hospice and Palliative Medicine

## 2021-08-30 ENCOUNTER — Inpatient Hospital Stay: Payer: Medicaid Other | Admitting: Pharmacist

## 2021-08-30 ENCOUNTER — Encounter: Payer: Self-pay | Admitting: *Deleted

## 2021-08-30 ENCOUNTER — Other Ambulatory Visit: Payer: Self-pay

## 2021-08-30 ENCOUNTER — Inpatient Hospital Stay (HOSPITAL_BASED_OUTPATIENT_CLINIC_OR_DEPARTMENT_OTHER): Payer: Medicaid Other | Admitting: Hospice and Palliative Medicine

## 2021-08-30 VITALS — BP 118/78 | HR 99 | Temp 96.4°F | Resp 16 | Wt 202.0 lb

## 2021-08-30 DIAGNOSIS — C3492 Malignant neoplasm of unspecified part of left bronchus or lung: Secondary | ICD-10-CM

## 2021-08-30 DIAGNOSIS — Z515 Encounter for palliative care: Secondary | ICD-10-CM

## 2021-08-30 DIAGNOSIS — C7951 Secondary malignant neoplasm of bone: Secondary | ICD-10-CM | POA: Insufficient documentation

## 2021-08-30 DIAGNOSIS — C787 Secondary malignant neoplasm of liver and intrahepatic bile duct: Secondary | ICD-10-CM | POA: Diagnosis not present

## 2021-08-30 LAB — COMPREHENSIVE METABOLIC PANEL
ALT: 10 U/L (ref 0–44)
AST: 17 U/L (ref 15–41)
Albumin: 3.5 g/dL (ref 3.5–5.0)
Alkaline Phosphatase: 73 U/L (ref 38–126)
Anion gap: 11 (ref 5–15)
BUN: 18 mg/dL (ref 8–23)
CO2: 30 mmol/L (ref 22–32)
Calcium: 9.6 mg/dL (ref 8.9–10.3)
Chloride: 94 mmol/L — ABNORMAL LOW (ref 98–111)
Creatinine, Ser: 0.66 mg/dL (ref 0.61–1.24)
GFR, Estimated: >60 mL/min (ref >60)
Glucose, Bld: 96 mg/dL (ref 70–99)
Potassium: 3.7 mmol/L (ref 3.5–5.1)
Sodium: 135 mmol/L (ref 135–145)
Total Bilirubin: 0.9 mg/dL (ref 0.3–1.2)
Total Protein: 6.7 g/dL (ref 6.5–8.1)

## 2021-08-30 LAB — CBC WITH DIFFERENTIAL/PLATELET
Abs Immature Granulocytes: 0.02 10*3/uL (ref 0.00–0.07)
Basophils Absolute: 0 10*3/uL (ref 0.0–0.1)
Basophils Relative: 0 %
Eosinophils Absolute: 0.1 10*3/uL (ref 0.0–0.5)
Eosinophils Relative: 1 %
HCT: 36.1 % — ABNORMAL LOW (ref 39.0–52.0)
Hemoglobin: 11.5 g/dL — ABNORMAL LOW (ref 13.0–17.0)
Immature Granulocytes: 0 %
Lymphocytes Relative: 15 %
Lymphs Abs: 0.9 10*3/uL (ref 0.7–4.0)
MCH: 28.3 pg (ref 26.0–34.0)
MCHC: 31.9 g/dL (ref 30.0–36.0)
MCV: 88.7 fL (ref 80.0–100.0)
Monocytes Absolute: 0.7 10*3/uL (ref 0.1–1.0)
Monocytes Relative: 11 %
Neutro Abs: 4.5 10*3/uL (ref 1.7–7.7)
Neutrophils Relative %: 73 %
Platelets: 158 10*3/uL (ref 150–400)
RBC: 4.07 MIL/uL — ABNORMAL LOW (ref 4.22–5.81)
RDW: 14.2 % (ref 11.5–15.5)
WBC: 6.2 10*3/uL (ref 4.0–10.5)
nRBC: 0 % (ref 0.0–0.2)

## 2021-08-30 LAB — MAGNESIUM: Magnesium: 2 mg/dL (ref 1.7–2.4)

## 2021-08-30 MED ORDER — SODIUM CHLORIDE 0.9 % IV SOLN
Freq: Once | INTRAVENOUS | Status: AC
  Filled 2021-08-30: qty 250

## 2021-08-30 MED ORDER — HEPARIN SOD (PORK) LOCK FLUSH 100 UNIT/ML IV SOLN
INTRAVENOUS | Status: AC
  Administered 2021-08-30: 500 [IU] via INTRAVENOUS
  Filled 2021-08-30: qty 5

## 2021-08-30 MED ORDER — ZOLEDRONIC ACID 4 MG/100ML IV SOLN
4.0000 mg | Freq: Once | INTRAVENOUS | Status: AC
  Administered 2021-08-30: 4 mg via INTRAVENOUS
  Filled 2021-08-30: qty 100

## 2021-08-30 MED ORDER — SODIUM CHLORIDE 0.9% FLUSH
10.0000 mL | INTRAVENOUS | Status: DC | PRN
  Administered 2021-08-30: 10 mL via INTRAVENOUS
  Filled 2021-08-30: qty 10

## 2021-08-30 MED ORDER — HEPARIN SOD (PORK) LOCK FLUSH 100 UNIT/ML IV SOLN
500.0000 [IU] | Freq: Once | INTRAVENOUS | Status: AC
  Filled 2021-08-30: qty 5

## 2021-08-30 NOTE — Patient Instructions (Signed)

## 2021-08-30 NOTE — Progress Notes (Signed)
Met with patient in the clinic. Pt did not have any questions or needs at this time. Will follow up at next visit.

## 2021-08-30 NOTE — Progress Notes (Signed)
Nutrition Follow-up:  Patient with stage IV adenocarcinoma of lung with liver mets.  PEG placed on 10/3 during hospitalization.  Patient receiving oral chemotherapy.   Met with patient in clinic.  Patient reports concerning about leaking around PEG tube site.  NP aware. Patient cleaning PEG tube site with hydrogen peroxide.    Says that he has tried some mashed potatoes, eggs and sausage orally recently.   Reports that he has been giving about 4 cartons of osmolite 1.5 daily.  Sometimes does not give full carton but "a little over half carton"  Can't tell RD what time of day he gives feeding.  Says that he has been giving a little less feeding more often but can't give details.  Says he is flushing tube with 1 syringe of water before and after feeding and sometimes gives more water but can't tell RD how much.  Says that he is giving prosource 60ml daily mixed with water.  Says that he is having a bowel movement about every other day.  No nausea.    Medications: reviewed  Labs: reviewed  Anthropometrics:   Weight 202 lb today (wearing jacket, stood up from wheelchair on scale)  198 lb 10/20   Estimated Energy Needs  Kcals: 2225-2670 Protein: 110-130 g Fluid: 2.2 L  NUTRITION DIAGNOSIS: Inadequate oral intake continues, relying on feeding tube    INTERVENTION:  Encouraged patient to use mild soap and water to clean around PEG tube site.  Reviewed instructions and provided written instructions to patient.  Reviewed SLP recommendations regarding oral intake and concern for aspiration pneumonia.   Reviewed tube feeding recommendations of 1 1/2 carton of osmolite 1.5 4 times per day and prosource 62ml TID via feeding tube.  Ask patient if he needed hand written instructions of tube feeding regimen again and he declined.  "I have it at home."  MONITORING, EVALUATION, GOAL: weight trends,tube feeding   NEXT VISIT: Tuesday, Jan 3 in clinic  [place]. Zenia Resides, [place], [place] Registered  Dietitian [phone] (mobile)

## 2021-08-30 NOTE — Progress Notes (Signed)
[place] at [facility] [phone]) [phone] Fax:([phone]   Name: Derek Clarke Date: 08/30/2021 MRN: [id number]  DOB: Mar 08, 1956  Patient Care Team: Pcp, No as PCP - General Kate Sable, MD as PCP - Cardiology (Cardiology) Telford Nab, RN as Oncology Nurse Navigator    REASON FOR CONSULTATION: Derek Clarke is a 65 y.o. 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 Patient was hospitalized 06/07/2021 to 06/09/2021 with COPD exacerbation and postobstructive pneumonia and then he was readmitted 06/14/2019 to 07/02/2021 for same.  His most recent hospitalization was complicated by aspiration pneumonia and poor oral intake necessitating PEG placement.  SOCIAL HISTORY:     reports that he quit smoking about 5 months ago. His smoking use included cigarettes. He has a 30.00 pack-year smoking history. He has never used smokeless tobacco. He reports current alcohol use. He reports current drug use. Drug: Marijuana.  Patient is unmarried and has no children.  He lives at home alone.  He has a brother who lives in [place].  Patient is a veteran from the [place].  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  His last job was at a [employer].  ADVANCE DIRECTIVES:  On file  CODE STATUS: Full code  PAST MEDICAL HISTORY: Past Medical History:  Diagnosis Date   Adenocarcinoma ([place])    lung; stage 4 with mets to liver   Chronic respiratory failure with hypoxia (HCC)    baseline 4 L [place]   COPD (chronic obstructive pulmonary disease) (HCC)    GAD (generalized anxiety disorder)     PAST SURGICAL HISTORY:  Past Surgical History:  Procedure Laterality Date   IR GASTROSTOMY TUBE MOD SED  06/28/2021   IR IMAGING GUIDED PORT INSERTION   06/03/2021    HEMATOLOGY/ONCOLOGY HISTORY:  Oncology History  Adenocarcinoma of left lung ([place])  05/26/2021 Initial Diagnosis   Adenocarcinoma of left lung ([place])   05/27/2021 Cancer Staging   Staging form: Lung, AJCC 8th Edition - Clinical stage from 05/27/2021: Stage IVB (cTX, cN3, pM1c) - Signed by Lloyd Huger, MD on 05/27/2021    06/16/2021 -  Chemotherapy    Patient is on Treatment Plan: LUNG NSCLC CARBOPLATIN + PACLITAXEL + BEVACIZUMAB Q21D X 1 CYCLE         ALLERGIES:  has No Known Allergies.  MEDICATIONS:  Current Outpatient Medications  Medication Sig Dispense Refill   albuterol (VENTOLIN HFA) 108 (90 Base) MCG/ACT inhaler Inhale 2 puffs into the lungs every 6 (six) hours as needed for wheezing or shortness of breath. 8 g 2   ALPRAZolam (XANAX) 0.5 MG tablet Take 1 tablet (0.5 mg total) by mouth 2 (two) times daily as needed for anxiety. 60 tablet 0   busPIRone (BUSPAR) 5 MG tablet Take 1 tablet (5 mg total) by mouth 3 (three) times daily. (Patient not taking: Reported on 08/30/2021) 90 tablet 1   furosemide (LASIX) 40 MG tablet Place 1 tablet (40 mg total) into feeding tube daily. 30 tablet 0   guaiFENesin (ROBITUSSIN) 100 MG/5ML SOLN Place 10 mLs (200 mg total) into feeding tube every 4 (four) hours as needed for cough or to loosen phlegm. (Patient not taking: Reported on 08/30/2021) 236 mL 0   Multiple Vitamin (MULTIVITAMIN WITH MINERALS) TABS tablet  Place 1 tablet into feeding tube daily. 90 tablet 0   Nutritional Supplements (FEEDING SUPPLEMENT, OSMOLITE 1.5 CAL,) LIQD Place 237 mLs into feeding tube 6 (six) times daily.  0   Nutritional Supplements (FEEDING SUPPLEMENT, PROSOURCE TF,) liquid Place 45 mLs into feeding tube 3 (three) times daily.     osimertinib mesylate (TAGRISSO) 80 MG tablet Take 1 tablet (80 mg total) by mouth daily. 30 tablet 2   potassium chloride (KLOR-CON) 20 MEQ packet Place 20 mEq into feeding tube daily. 30 packet 0   Water For Irrigation,  Sterile (FREE WATER) SOLN Place 120 mLs into feeding tube 6 (six) times daily.     No current facility-administered medications for this visit.   Facility-Administered Medications Ordered in Other Visits  Medication Dose Route Frequency Provider Last Rate Last Admin   heparin lock flush 100 unit/mL  500 Units Intravenous Once Lloyd Huger, MD       sodium chloride flush (NS) 0.9 % injection 10 mL  10 mL Intravenous PRN Lloyd Huger, MD   10 mL at 08/30/21 1305    VITAL SIGNS: There were no vitals taken for this visit. There were no vitals filed for this visit.  Estimated body mass index is 29.83 kg/m as calculated from the following:   Height as of 07/14/21: 5\' 9"  (1.753 m).   Weight as of an earlier encounter on 08/30/21: 202 lb (91.6 kg).  LABS: CBC:    Component Value Date/Time   WBC 6.2 08/30/2021 1305   HGB 11.5 (L) 08/30/2021 1305   HCT 36.1 (L) 08/30/2021 1305   PLT 158 08/30/2021 1305   MCV 88.7 08/30/2021 1305   NEUTROABS 4.5 08/30/2021 1305   LYMPHSABS 0.9 08/30/2021 1305   MONOABS 0.7 08/30/2021 1305   EOSABS 0.1 08/30/2021 1305   BASOSABS 0.0 08/30/2021 1305   Comprehensive Metabolic Panel:    Component Value Date/Time   NA 135 08/30/2021 1305   K 3.7 08/30/2021 1305   CL 94 (L) 08/30/2021 1305   CO2 30 08/30/2021 1305   BUN 18 08/30/2021 1305   CREATININE 0.66 08/30/2021 1305   GLUCOSE 96 08/30/2021 1305   CALCIUM 9.6 08/30/2021 1305   AST 17 08/30/2021 1305   ALT 10 08/30/2021 1305   ALKPHOS 73 08/30/2021 1305   BILITOT 0.9 08/30/2021 1305   PROT 6.7 08/30/2021 1305   ALBUMIN 3.5 08/30/2021 1305    RADIOGRAPHIC STUDIES: No results found.  PERFORMANCE STATUS (ECOG) : 2 - Symptomatic, <50% confined to bed  Review of Systems Unless otherwise noted, a complete review of systems is negative.  Physical Exam General: NAD Pulmonary: Unlabored, on O2 Abdomen: PEG with surrounding erythema/discoloration GU: no suprapubic  tenderness Extremities: no edema, no joint deformities Skin: no rashes Neurological: Weakness but otherwise nonfocal  IMPRESSION: Follow-up visit.  Patient reports that he is doing reasonably well.  He denies significant changes or concerns.  He does endorse some redness and discomfort around his PEG.  He says that there is slight leakage when he sleeps as he leans forward while sleeping.  PEG examined and looks like slight excoriation.  Patient says that he is changing the dressing every few days.  I suggested that he try changing the dressing daily and applying a barrier cream.  I suspect that he is having leakage as he leans forward allowing the balloon to slip away from the stomach wall.  We could refer back to IR if this continues or becomes more problematic.  PLAN: -  Continue current scope of treatment -Recommend Vaseline, triple antibiotic, barrier cream applied to erythema surrounding PEG -RTC in 4 weeks  Case and plan discussed with Dr. Grayland Ormond   Patient expressed understanding and was in agreement with this plan. He also understands that He can call the clinic at any time with any questions, concerns, or complaints.     Time Total: 15 minutes  Visit consisted of counseling and education dealing with the complex and emotionally intense issues of symptom management and palliative care in the setting of serious and potentially life-threatening illness.Greater than 50%  of this time was spent counseling and coordinating care related to the above assessment and plan.  Signed by: Altha Harm, PhD, NP-C

## 2021-08-30 NOTE — Progress Notes (Signed)
Pt requesting refill on xanax, is running low on potassium and lasix. No other concerns at this time. Feeding tube area is red and pt reports increase in drainage to area.

## 2021-08-31 ENCOUNTER — Inpatient Hospital Stay (HOSPITAL_BASED_OUTPATIENT_CLINIC_OR_DEPARTMENT_OTHER): Payer: Medicaid Other | Admitting: Hospice and Palliative Medicine

## 2021-08-31 ENCOUNTER — Encounter: Payer: Self-pay | Admitting: Oncology

## 2021-08-31 VITALS — BP 106/71 | HR 98 | Temp 97.7°F | Resp 18

## 2021-08-31 DIAGNOSIS — C7951 Secondary malignant neoplasm of bone: Secondary | ICD-10-CM | POA: Diagnosis not present

## 2021-08-31 DIAGNOSIS — J00 Acute nasopharyngitis [common cold]: Secondary | ICD-10-CM

## 2021-08-31 MED ORDER — ALPRAZOLAM 0.5 MG PO TABS: 0.5000 mg | ORAL_TABLET | Freq: Two times a day (BID) | ORAL | 0 refills | Status: AC | PRN

## 2021-08-31 NOTE — Progress Notes (Signed)
Symptom Management [place] at [facility] [phone] [phone]  Patient Care Team: Pcp, No as PCP - General Kate Sable, MD as PCP - Cardiology (Cardiology) Telford Nab, RN as Oncology Nurse Navigator   Name of the patient: Derek Clarke  [id number]  [birth year]   Date of visit: 08/31/21  Reason for Consult:  Derek Clarke is a 65 y.o. 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 Patient was hospitalized 06/07/2021 to 06/09/2021 with COPD exacerbation and postobstructive pneumonia and then he was readmitted 06/14/2019 to 07/02/2021 for same.  His most recent hospitalization was complicated by aspiration pneumonia and poor oral intake necessitating PEG placement.  Patient was seen yesterday by Dr. Grayland Clarke, pharmacist, nutritionist,  and me.  At that time, he was doing about as well as can be expected given his advanced malignancy, advanced COPD, and limited social support.  He admitted to some noncompliance with medications and Tagrisso.  He was not achieving goal tube feed administrations and noted some leakage and excoriation around the PEG.  These concerns were addressed and patient received dose of Zometa with plan to return to clinic in 4 weeks for repeat labs, further evaluation, and continuation of treatment.  Patient presented to clinic today without an appointment requesting to be seen.  He was an add-on to [facility].  Patient says that he started developing rhinorrhea yesterday with postnasal drip and productive cough.  He felt his throat was somewhat scratchy.  He denies fever or chills.  No significant worsening in his breathing, shortness of breath, or wheezing.  Today, he says he feels better but just wanted to be "checked out."  Denies any neurologic complaints. Denies recent fevers or illnesses. Denies  any easy bleeding or bruising. Reports good appetite and denies weight loss. Denies chest pain. Denies any nausea, vomiting, constipation, or diarrhea. Denies urinary complaints. Patient offers no further specific complaints today.  PAST MEDICAL HISTORY: Past Medical History:  Diagnosis Date   Adenocarcinoma ([place])    lung; stage 4 with mets to liver   Chronic respiratory failure with hypoxia (HCC)    baseline 4 L [place]   COPD (chronic obstructive pulmonary disease) (HCC)    GAD (generalized anxiety disorder)     PAST SURGICAL HISTORY:  Past Surgical History:  Procedure Laterality Date   IR GASTROSTOMY TUBE MOD SED  06/28/2021   IR IMAGING GUIDED PORT INSERTION  06/03/2021    HEMATOLOGY/ONCOLOGY HISTORY:  Oncology History  Adenocarcinoma of left lung ([place])  05/26/2021 Initial Diagnosis   Adenocarcinoma of left lung ([place])   05/27/2021 Cancer Staging   Staging form: Lung, AJCC 8th Edition - Clinical stage from 05/27/2021: Stage IVB (cTX, cN3, pM1c) - Signed by Lloyd Huger, MD on 05/27/2021    06/16/2021 -  Chemotherapy    Patient is on Treatment Plan: LUNG NSCLC CARBOPLATIN + PACLITAXEL + BEVACIZUMAB Q21D X 1 CYCLE         ALLERGIES:  has No Known Allergies.  MEDICATIONS:  Current Outpatient Medications  Medication Sig Dispense Refill   albuterol (VENTOLIN HFA) 108 (90 Base) MCG/ACT inhaler Inhale 2 puffs into the lungs every 6 (six) hours as needed for wheezing or shortness of breath. 8 g 2   ALPRAZolam (XANAX) 0.5 MG tablet Take 1 tablet (0.5 mg total) by mouth 2 (two) times daily as needed for anxiety. 60 tablet 0  busPIRone (BUSPAR) 5 MG tablet Take 1 tablet (5 mg total) by mouth 3 (three) times daily. (Patient not taking: Reported on 08/30/2021) 90 tablet 1   furosemide (LASIX) 40 MG tablet Place 1 tablet (40 mg total) into feeding tube daily. 30 tablet 0   guaiFENesin (ROBITUSSIN) 100 MG/5ML SOLN Place 10 mLs (200 mg total) into feeding tube every 4 (four) hours as needed  for cough or to loosen phlegm. (Patient not taking: Reported on 08/30/2021) 236 mL 0   Multiple Vitamin (MULTIVITAMIN WITH MINERALS) TABS tablet Place 1 tablet into feeding tube daily. 90 tablet 0   Nutritional Supplements (FEEDING SUPPLEMENT, OSMOLITE 1.5 CAL,) LIQD Place 237 mLs into feeding tube 6 (six) times daily.  0   Nutritional Supplements (FEEDING SUPPLEMENT, PROSOURCE TF,) liquid Place 45 mLs into feeding tube 3 (three) times daily.     osimertinib mesylate (TAGRISSO) 80 MG tablet Take 1 tablet (80 mg total) by mouth daily. 30 tablet 2   potassium chloride (KLOR-CON) 20 MEQ packet Place 20 mEq into feeding tube daily. 30 packet 0   Water For Irrigation, Sterile (FREE WATER) SOLN Place 120 mLs into feeding tube 6 (six) times daily.     No current facility-administered medications for this visit.    VITAL SIGNS: There were no vitals taken for this visit. There were no vitals filed for this visit.  Estimated body mass index is 29.83 kg/m as calculated from the following:   Height as of 07/14/21: 5\' 9"  (1.753 m).   Weight as of 08/30/21: 202 lb (91.6 kg).  LABS: CBC:    Component Value Date/Time   WBC 6.2 08/30/2021 1305   HGB 11.5 (L) 08/30/2021 1305   HCT 36.1 (L) 08/30/2021 1305   PLT 158 08/30/2021 1305   MCV 88.7 08/30/2021 1305   NEUTROABS 4.5 08/30/2021 1305   LYMPHSABS 0.9 08/30/2021 1305   MONOABS 0.7 08/30/2021 1305   EOSABS 0.1 08/30/2021 1305   BASOSABS 0.0 08/30/2021 1305   Comprehensive Metabolic Panel:    Component Value Date/Time   NA 135 08/30/2021 1305   K 3.7 08/30/2021 1305   CL 94 (L) 08/30/2021 1305   CO2 30 08/30/2021 1305   BUN 18 08/30/2021 1305   CREATININE 0.66 08/30/2021 1305   GLUCOSE 96 08/30/2021 1305   CALCIUM 9.6 08/30/2021 1305   AST 17 08/30/2021 1305   ALT 10 08/30/2021 1305   ALKPHOS 73 08/30/2021 1305   BILITOT 0.9 08/30/2021 1305   PROT 6.7 08/30/2021 1305   ALBUMIN 3.5 08/30/2021 1305    RADIOGRAPHIC STUDIES: No results  found.  PERFORMANCE STATUS (ECOG) : 3 - Symptomatic, >50% confined to bed  Review of Systems Unless otherwise noted, a complete review of systems is negative.  Physical Exam General: NAD Cardiovascular: regular rate and rhythm Pulmonary: clear ant fields Abdomen: soft, nontender, + bowel sounds GU: no suprapubic tenderness Extremities: no edema, no joint deformities Skin: no rashes Neurological: Weakness but otherwise nonfocal  Assessment and Plan- Patient is a 65 y.o. 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  Patient presents to [facility] for evaluation of runny nose and occasional productive cough/scratchy throat.   Nasopharyngitis -likely viral with cold-like symptoms.  Patient says he actually feels better today.  We discussed symptomatic care with Mucinex and Flonase.  Utilize inhalers as needed.  Patient strongly recommended to obtain a pulse  oximeter to monitor SPO2 at home.  Discussed ER/urgent care triggers.   Patient expressed understanding and was in agreement with this plan. He also understands that He can call clinic at any time with any questions, concerns, or complaints.   Thank you for allowing me to participate in the care of this very pleasant patient.   Time Total: 15 minutes  Visit consisted of counseling and education dealing with the complex and emotionally intense issues of symptom management in the setting of serious illness.Greater than 50%  of this time was spent counseling and coordinating care related to the above assessment and plan.  Signed by: Altha Harm, PhD, NP-C

## 2021-08-31 NOTE — Progress Notes (Signed)
States that he was here yesterday, waiting on his ride, when all of a sudden his nose started running and he became wheezy and congested. He reports that he took some benadryl when he got home and used his albuterol inhaler, and was able to clear the secretions after that. He states that his throat feels a bit scratchy today, but congestion is better. He was concerned about these symptoms and thought he needed to be "checked out".

## 2021-09-02 ENCOUNTER — Telehealth: Payer: Self-pay | Admitting: *Deleted

## 2021-09-02 NOTE — Telephone Encounter (Signed)
Telephone call to patient for follow up after receiving first zometa infusion on 12/6. No answer from patient. I informed patient that I was calling him to check on him to see if he tolerated the zometa well.   I encouraged the patient to call our office or send a mychart msg if he has any concerns or new symptoms.   I will also send mychart msg to patient.

## 2021-09-02 NOTE — Telephone Encounter (Addendum)
[name] returned my phone call. He stated that he saw [name], NP on Wednesday after his zometa infusion. He has c/o of a horseness in his voice/post nasal drip. His symptoms have not yet resolved, but are not any worse. He has a h/o COPD. Pt has had mild shortness of breath. He is using his inhalers as directed. He is not using OTC measures at this time for the nasal drip. Pt stated that [name] didn't think this was r/t to his zometa infusion. He is not experiencing any fevers at this time.  I reviewed [name]'s note from Wednesday. Reiterated that per [name]'s note, that these symptoms may be more "viral/cold-like." I inquired if patient picked up a pulse ox device. He stated that he not yet done this. He was thinking about going to the urgent care this weekend if he is not feeling any better to get a chest xray to r/o pneumonia.   He did thank me for calling and checking on him.

## 2021-09-12 ENCOUNTER — Telehealth: Payer: Self-pay | Admitting: *Deleted

## 2021-09-12 NOTE — Telephone Encounter (Signed)
Pt requesting appt to see [name] Wednesday afternoon to follow up with him regarding shortness of breath. Pt states that he feels slightly short of breath and states that inhalers have not been helpful. Per [name], okay to schedule Wednesday afternoon. Pt has been made aware of appt and stated will ask his brother to bring him. Instructed pt to let us know if he needs help with transportation to appt if brother unable to help transport. Pt verbalized understanding.

## 2021-09-14 ENCOUNTER — Inpatient Hospital Stay: Payer: Medicaid Other

## 2021-09-14 ENCOUNTER — Inpatient Hospital Stay
Admission: EM | Admit: 2021-09-14 | Discharge: 2021-09-25 | DRG: 190 | Disposition: E | Payer: Medicaid Other | Attending: Internal Medicine | Admitting: Internal Medicine

## 2021-09-14 ENCOUNTER — Other Ambulatory Visit: Payer: Self-pay

## 2021-09-14 ENCOUNTER — Inpatient Hospital Stay (HOSPITAL_BASED_OUTPATIENT_CLINIC_OR_DEPARTMENT_OTHER): Payer: Medicaid Other | Admitting: Hospice and Palliative Medicine

## 2021-09-14 ENCOUNTER — Emergency Department: Payer: Medicaid Other

## 2021-09-14 ENCOUNTER — Encounter: Payer: Self-pay | Admitting: Oncology

## 2021-09-14 ENCOUNTER — Encounter: Payer: Self-pay | Admitting: Internal Medicine

## 2021-09-14 VITALS — BP 147/82 | HR 125 | Temp 96.7°F | Resp 22

## 2021-09-14 DIAGNOSIS — Z66 Do not resuscitate: Secondary | ICD-10-CM | POA: Diagnosis present

## 2021-09-14 DIAGNOSIS — R1310 Dysphagia, unspecified: Secondary | ICD-10-CM

## 2021-09-14 DIAGNOSIS — I5033 Acute on chronic diastolic (congestive) heart failure: Secondary | ICD-10-CM | POA: Diagnosis present

## 2021-09-14 DIAGNOSIS — J9601 Acute respiratory failure with hypoxia: Secondary | ICD-10-CM | POA: Diagnosis present

## 2021-09-14 DIAGNOSIS — Z79899 Other long term (current) drug therapy: Secondary | ICD-10-CM

## 2021-09-14 DIAGNOSIS — Z931 Gastrostomy status: Secondary | ICD-10-CM | POA: Diagnosis not present

## 2021-09-14 DIAGNOSIS — J9622 Acute and chronic respiratory failure with hypercapnia: Secondary | ICD-10-CM | POA: Diagnosis present

## 2021-09-14 DIAGNOSIS — Z20822 Contact with and (suspected) exposure to covid-19: Secondary | ICD-10-CM | POA: Diagnosis present

## 2021-09-14 DIAGNOSIS — C7951 Secondary malignant neoplasm of bone: Secondary | ICD-10-CM | POA: Diagnosis present

## 2021-09-14 DIAGNOSIS — J441 Chronic obstructive pulmonary disease with (acute) exacerbation: Secondary | ICD-10-CM | POA: Diagnosis present

## 2021-09-14 DIAGNOSIS — C3492 Malignant neoplasm of unspecified part of left bronchus or lung: Secondary | ICD-10-CM | POA: Diagnosis present

## 2021-09-14 DIAGNOSIS — Z515 Encounter for palliative care: Secondary | ICD-10-CM | POA: Diagnosis not present

## 2021-09-14 DIAGNOSIS — F411 Generalized anxiety disorder: Secondary | ICD-10-CM | POA: Diagnosis present

## 2021-09-14 DIAGNOSIS — Z87891 Personal history of nicotine dependence: Secondary | ICD-10-CM | POA: Diagnosis not present

## 2021-09-14 DIAGNOSIS — R0902 Hypoxemia: Secondary | ICD-10-CM

## 2021-09-14 DIAGNOSIS — J9621 Acute and chronic respiratory failure with hypoxia: Secondary | ICD-10-CM | POA: Diagnosis present

## 2021-09-14 DIAGNOSIS — J9611 Chronic respiratory failure with hypoxia: Secondary | ICD-10-CM | POA: Diagnosis present

## 2021-09-14 DIAGNOSIS — R0602 Shortness of breath: Secondary | ICD-10-CM

## 2021-09-14 DIAGNOSIS — C787 Secondary malignant neoplasm of liver and intrahepatic bile duct: Secondary | ICD-10-CM | POA: Diagnosis present

## 2021-09-14 DIAGNOSIS — Z9981 Dependence on supplemental oxygen: Secondary | ICD-10-CM

## 2021-09-14 DIAGNOSIS — C3490 Malignant neoplasm of unspecified part of unspecified bronchus or lung: Secondary | ICD-10-CM | POA: Diagnosis present

## 2021-09-14 DIAGNOSIS — G9341 Metabolic encephalopathy: Secondary | ICD-10-CM | POA: Diagnosis present

## 2021-09-14 DIAGNOSIS — J9602 Acute respiratory failure with hypercapnia: Secondary | ICD-10-CM | POA: Diagnosis present

## 2021-09-14 LAB — CBC
HCT: 42.9 % (ref 39.0–52.0)
Hemoglobin: 13.3 g/dL (ref 13.0–17.0)
MCH: 28.4 pg (ref 26.0–34.0)
MCHC: 31 g/dL (ref 30.0–36.0)
MCV: 91.5 fL (ref 80.0–100.0)
Platelets: 218 10*3/uL (ref 150–400)
RBC: 4.69 MIL/uL (ref 4.22–5.81)
RDW: 13.8 % (ref 11.5–15.5)
WBC: 15.2 10*3/uL — ABNORMAL HIGH (ref 4.0–10.5)
nRBC: 0 % (ref 0.0–0.2)

## 2021-09-14 LAB — BLOOD GAS, ARTERIAL
Acid-Base Excess: 3.3 mmol/L — ABNORMAL HIGH (ref 0.0–2.0)
Acid-Base Excess: 4.7 mmol/L — ABNORMAL HIGH (ref 0.0–2.0)
Bicarbonate: 36.3 mmol/L — ABNORMAL HIGH (ref 20.0–28.0)
Bicarbonate: 35.2 mmol/L — ABNORMAL HIGH (ref 20.0–28.0)
Expiratory PAP: 6
FIO2: 1
FIO2: 1
Inspiratory PAP: 18
MECHVT: 550 mL
Mode: POSITIVE
O2 Saturation: 99.8 %
O2 Saturation: 99.9 %
Patient temperature: 37
Patient temperature: 37
RATE: 10 resp/min
RATE: 12 resp/min
pCO2 arterial: 109 mmHg (ref 32.0–48.0)
pCO2 arterial: 84 mmHg (ref 32.0–48.0)
pH, Arterial: 7.13 — CL (ref 7.350–7.450)
pH, Arterial: 7.23 — ABNORMAL LOW (ref 7.350–7.450)
pO2, Arterial: 257 mmHg — ABNORMAL HIGH (ref 83.0–108.0)
pO2, Arterial: 331 mmHg — ABNORMAL HIGH (ref 83.0–108.0)

## 2021-09-14 LAB — TROPONIN I (HIGH SENSITIVITY)
Troponin I (High Sensitivity): 9 ng/L (ref <18)
Troponin I (High Sensitivity): 30 ng/L — ABNORMAL HIGH (ref <18)

## 2021-09-14 LAB — COMPREHENSIVE METABOLIC PANEL
ALT: 11 U/L (ref 0–44)
AST: 19 U/L (ref 15–41)
Albumin: 3.9 g/dL (ref 3.5–5.0)
Alkaline Phosphatase: 95 U/L (ref 38–126)
Anion gap: 7 (ref 5–15)
BUN: 13 mg/dL (ref 8–23)
CO2: 34 mmol/L — ABNORMAL HIGH (ref 22–32)
Calcium: 9.2 mg/dL (ref 8.9–10.3)
Chloride: 94 mmol/L — ABNORMAL LOW (ref 98–111)
Creatinine, Ser: 0.47 mg/dL — ABNORMAL LOW (ref 0.61–1.24)
GFR, Estimated: >60 mL/min (ref >60)
Glucose, Bld: 204 mg/dL — ABNORMAL HIGH (ref 70–99)
Potassium: 4.2 mmol/L (ref 3.5–5.1)
Sodium: 135 mmol/L (ref 135–145)
Total Bilirubin: 0.9 mg/dL (ref 0.3–1.2)
Total Protein: 7.8 g/dL (ref 6.5–8.1)

## 2021-09-14 LAB — RESP PANEL BY RT-PCR (FLU A&B, COVID) ARPGX2
Influenza A by PCR: NEGATIVE
Influenza B by PCR: NEGATIVE
SARS Coronavirus 2 by RT PCR: NEGATIVE

## 2021-09-14 MED ORDER — LORAZEPAM 2 MG/ML IJ SOLN: 2.0000 mg | INTRAMUSCULAR | Status: DC | PRN

## 2021-09-14 MED ORDER — IPRATROPIUM-ALBUTEROL 0.5-2.5 (3) MG/3ML IN SOLN
3.0000 mL | Freq: Four times a day (QID) | RESPIRATORY_TRACT | Status: DC
  Filled 2021-09-14: qty 3

## 2021-09-14 MED ORDER — LORAZEPAM 2 MG/ML PO CONC: 2.0000 mg | ORAL | Status: DC | PRN

## 2021-09-14 MED ORDER — LORAZEPAM 2 MG PO TABS: 2.0000 mg | ORAL_TABLET | ORAL | Status: DC | PRN

## 2021-09-14 MED ORDER — SODIUM CHLORIDE 0.9 % IV SOLN: 2.0000 g | Freq: Three times a day (TID) | INTRAVENOUS | Status: DC

## 2021-09-14 MED ORDER — FUROSEMIDE 10 MG/ML IJ SOLN
40.0000 mg | Freq: Once | INTRAMUSCULAR | Status: AC
Start: 2021-09-14 — End: 2021-09-14
  Administered 2021-09-14: 19:00:00 40 mg via INTRAVENOUS
  Filled 2021-09-14: qty 4

## 2021-09-14 MED ORDER — ENOXAPARIN SODIUM 40 MG/0.4ML IJ SOSY: 40.0000 mg | PREFILLED_SYRINGE | INTRAMUSCULAR | Status: DC

## 2021-09-14 MED ORDER — ACETAMINOPHEN 325 MG PO TABS: 650.0000 mg | ORAL_TABLET | Freq: Four times a day (QID) | ORAL | Status: DC | PRN

## 2021-09-14 MED ORDER — METHYLPREDNISOLONE SODIUM SUCC 125 MG IJ SOLR
125.0000 mg | Freq: Four times a day (QID) | INTRAMUSCULAR | Status: DC
  Administered 2021-09-14: 18:00:00 125 mg via INTRAVENOUS
  Filled 2021-09-14: qty 2

## 2021-09-14 MED ORDER — LORAZEPAM 1 MG PO TABS: 1.0000 mg | ORAL_TABLET | ORAL | Status: DC | PRN

## 2021-09-14 MED ORDER — MORPHINE SULFATE (PF) 4 MG/ML IV SOLN: 4.0000 mg | INTRAVENOUS | Status: DC | PRN

## 2021-09-14 MED ORDER — ACETAMINOPHEN 325 MG RE SUPP: 650.0000 mg | Freq: Four times a day (QID) | RECTAL | Status: DC | PRN

## 2021-09-14 MED ORDER — IPRATROPIUM-ALBUTEROL 0.5-2.5 (3) MG/3ML IN SOLN
3.0000 mL | RESPIRATORY_TRACT | Status: DC | PRN
  Administered 2021-09-14: 18:00:00 3 mL via RESPIRATORY_TRACT
  Filled 2021-09-14: qty 3

## 2021-09-14 MED ORDER — SODIUM CHLORIDE 0.9 % IV SOLN
1.0000 g | INTRAVENOUS | Status: DC
  Administered 2021-09-14: 18:00:00 1 g via INTRAVENOUS
  Filled 2021-09-14: qty 10

## 2021-09-14 MED ORDER — MORPHINE SULFATE (PF) 4 MG/ML IV SOLN: 5.0000 mg | INTRAVENOUS | Status: DC | PRN

## 2021-09-14 MED ORDER — ONDANSETRON HCL 4 MG/2ML IJ SOLN: 4.0000 mg | Freq: Four times a day (QID) | INTRAMUSCULAR | Status: DC | PRN

## 2021-09-14 MED ORDER — MORPHINE SULFATE (PF) 2 MG/ML IV SOLN
INTRAVENOUS | Status: AC
  Administered 2021-09-14: 20:00:00 5 mg via INTRAVENOUS
  Filled 2021-09-14: qty 3

## 2021-09-14 MED ORDER — MORPHINE SULFATE (PF) 4 MG/ML IV SOLN
8.0000 mg | Freq: Once | INTRAVENOUS | Status: AC
  Administered 2021-09-14: 19:00:00 8 mg via INTRAVENOUS
  Filled 2021-09-14: qty 2

## 2021-09-14 MED ORDER — LORAZEPAM 2 MG/ML PO CONC
1.0000 mg | ORAL | Status: DC | PRN
  Filled 2021-09-14: qty 0.5

## 2021-09-14 MED ORDER — LORAZEPAM 2 MG/ML IJ SOLN
1.0000 mg | INTRAMUSCULAR | Status: DC | PRN
  Administered 2021-09-14: 20:00:00 1 mg via INTRAVENOUS
  Filled 2021-09-14: qty 1

## 2021-09-14 MED ORDER — ONDANSETRON HCL 4 MG PO TABS: 4.0000 mg | ORAL_TABLET | Freq: Four times a day (QID) | ORAL | Status: DC | PRN

## 2021-09-15 ENCOUNTER — Encounter: Payer: Self-pay | Admitting: *Deleted

## 2021-09-25 NOTE — ED Notes (Addendum)
MD Paduchowski and MD Mal Misty made aware of patient O2 sats dropping to 78% on bipap 100% O2. Patient unresponsive to verbal stimuli or sternal rub

## 2021-09-25 NOTE — ED Notes (Signed)
MD updating patients brother on plan with bi-pap and possible further treatment

## 2021-09-25 NOTE — ED Notes (Signed)
[facility] taking patient down now.

## 2021-09-25 NOTE — Progress Notes (Signed)
I responded to a page from the nurse to provide spiritual support for the patient's family. I visited the patient's room and the patient's brother, Johnsie Cancel, was present. I shared words of comfort and led in prayer.    10/04/21 2055  Clinical Encounter Type  Visited With Patient and family together  Visit Type Spiritual support;Death  Referral From Nurse  Consult/Referral To Chaplain  Spiritual Encounters  Spiritual Needs Prayer;Emotional;Grief support    Chaplain Dr Redgie Grayer

## 2021-09-25 NOTE — ED Notes (Signed)
MD Paduchowski speaking with brother at bedside about care at this time

## 2021-09-25 NOTE — Progress Notes (Addendum)
Symptom Management [place] at [facility] Telephone:(336) [phone] Fax:(336) [phone]  Patient Care Team: Pcp, No as PCP - General Kate Sable, MD as PCP - Cardiology (Cardiology) Telford Nab, RN as Oncology Nurse Navigator   Name of the patient: Derek Clarke  [id number]  [birth year]   Date of visit: Sep 17, 2021  Reason for Consult:  Derek Clarke is a 66 y.o. 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 Patient was hospitalized 06/07/2021 to 06/09/2021 with COPD exacerbation and postobstructive pneumonia and then he was readmitted 06/14/2019 to 07/02/2021 for same.  His most recent hospitalization was complicated by aspiration pneumonia and poor oral intake necessitating PEG placement.  Patient was seen on 08/30/2021 by Dr. Grayland Ormond, pharmacist, nutritionist,  and me.  At that time, he was doing about as well as can be expected given his advanced malignancy, advanced COPD, and limited social support.  He admitted to some noncompliance with medications and Tagrisso.  He was not achieving goal tube feed administrations and noted some leakage and excoriation around the PEG.  These concerns were addressed and patient received dose of Zometa with plan to return to clinic in 4 weeks for repeat labs, further evaluation, and continuation of treatment.  Patient was an add-on to [employer] [employer] schedule on 08/31/2021 with complaint of rhinorrhea, postnasal drip, and productive cough.  This was felt likely to reflect viral URI and patient was recommended symptomatic care with recommendation to monitor SPO2 and to report worsening symptoms.  Patient presents to [facility] today for follow-up of shortness of breath.  He reports his symptoms have worsened over the past 3 days.  He denies fever or chills but reports progressively worse shortness of breath and congested  cough.    Patient presents with hypoxia and respiratory distress.  Patient had difficulty answering questions.  He is accompanied by his brother.  PAST MEDICAL HISTORY: Past Medical History:  Diagnosis Date   Adenocarcinoma ([place])    lung; stage 4 with mets to liver   Chronic respiratory failure with hypoxia (HCC)    baseline 4 L [place]   COPD (chronic obstructive pulmonary disease) (HCC)    GAD (generalized anxiety disorder)     PAST SURGICAL HISTORY:  Past Surgical History:  Procedure Laterality Date   IR GASTROSTOMY TUBE MOD SED  06/28/2021   IR IMAGING GUIDED PORT INSERTION  06/03/2021    HEMATOLOGY/ONCOLOGY HISTORY:  Oncology History  Adenocarcinoma of left lung ([place])  05/26/2021 Initial Diagnosis   Adenocarcinoma of left lung ([place])   05/27/2021 Cancer Staging   Staging form: Lung, AJCC 8th Edition - Clinical stage from 05/27/2021: Stage IVB (cTX, cN3, pM1c) - Signed by Lloyd Huger, MD on 05/27/2021   06/16/2021 -  Chemotherapy    Patient is on Treatment Plan: LUNG NSCLC CARBOPLATIN + PACLITAXEL + BEVACIZUMAB Q21D X 1 CYCLE        ALLERGIES:  has No Known Allergies.  MEDICATIONS:  Current Outpatient Medications  Medication Sig Dispense Refill   albuterol (VENTOLIN HFA) 108 (90 Base) MCG/ACT inhaler Inhale 2 puffs into the lungs every 6 (six) hours as needed for wheezing or shortness of breath. 8 g 2   ALPRAZolam (XANAX) 0.5 MG tablet Take 1 tablet (0.5 mg total) by mouth 2 (two) times daily as needed for anxiety. 60 tablet 0   busPIRone (BUSPAR) 5 MG tablet Take 1 tablet (5 mg  total) by mouth 3 (three) times daily. (Patient not taking: Reported on 08/30/2021) 90 tablet 1   furosemide (LASIX) 40 MG tablet Place 1 tablet (40 mg total) into feeding tube daily. 30 tablet 0   guaiFENesin (ROBITUSSIN) 100 MG/5ML SOLN Place 10 mLs (200 mg total) into feeding tube every 4 (four) hours as needed for cough or to loosen phlegm. (Patient not taking: Reported on 08/30/2021) 236 mL 0    Multiple Vitamin (MULTIVITAMIN WITH MINERALS) TABS tablet Place 1 tablet into feeding tube daily. 90 tablet 0   Nutritional Supplements (FEEDING SUPPLEMENT, OSMOLITE 1.5 CAL,) LIQD Place 237 mLs into feeding tube 6 (six) times daily.  0   Nutritional Supplements (FEEDING SUPPLEMENT, PROSOURCE TF,) liquid Place 45 mLs into feeding tube 3 (three) times daily.     osimertinib mesylate (TAGRISSO) 80 MG tablet Take 1 tablet (80 mg total) by mouth daily. 30 tablet 2   potassium chloride (KLOR-CON) 20 MEQ packet Place 20 mEq into feeding tube daily. 30 packet 0   Water For Irrigation, Sterile (FREE WATER) SOLN Place 120 mLs into feeding tube 6 (six) times daily.     No current facility-administered medications for this visit.    VITAL SIGNS: BP (!) 147/82    Pulse (!) 125    Temp (!) 96.7 F (35.9 C) (Tympanic)    Resp (!) 22    SpO2 (!) 79%  There were no vitals filed for this visit.  Estimated body mass index is 29.83 kg/m as calculated from the following:   Height as of 07/14/21: '5\' 9"'  (1.753 m).   Weight as of 08/30/21: 202 lb (91.6 kg).  LABS: CBC:    Component Value Date/Time   WBC 6.2 08/30/2021 1305   HGB 11.5 (L) 08/30/2021 1305   HCT 36.1 (L) 08/30/2021 1305   PLT 158 08/30/2021 1305   MCV 88.7 08/30/2021 1305   NEUTROABS 4.5 08/30/2021 1305   LYMPHSABS 0.9 08/30/2021 1305   MONOABS 0.7 08/30/2021 1305   EOSABS 0.1 08/30/2021 1305   BASOSABS 0.0 08/30/2021 1305   Comprehensive Metabolic Panel:    Component Value Date/Time   NA 135 08/30/2021 1305   K 3.7 08/30/2021 1305   CL 94 (L) 08/30/2021 1305   CO2 30 08/30/2021 1305   BUN 18 08/30/2021 1305   CREATININE 0.66 08/30/2021 1305   GLUCOSE 96 08/30/2021 1305   CALCIUM 9.6 08/30/2021 1305   AST 17 08/30/2021 1305   ALT 10 08/30/2021 1305   ALKPHOS 73 08/30/2021 1305   BILITOT 0.9 08/30/2021 1305   PROT 6.7 08/30/2021 1305   ALBUMIN 3.5 08/30/2021 1305    RADIOGRAPHIC STUDIES: No results found.  PERFORMANCE  STATUS (ECOG) : 3 - Symptomatic, >50% confined to bed  Review of Systems Unable to complete  Physical Exam General: Ill-appearing Cardiovascular: Tachycardic Pulmonary: Rhonchi, tachypneic, labored breathing Extremities: edema, no joint deformities Skin: no rashes Neurological: Weakness, answering questions but limited by shortness of breath  Assessment and Plan- Patient is a 66 y.o. 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  Patient presents to [facility] with respiratory distress   Respiratory distress-patient presents with hypoxia and respiratory distress.  SPO2 low 80s even on 6 L O2.  Patient transported to ER for further evaluation and management.  Report given to ER triage nurse.  I met privately with patient's brother to discuss clinical  concerns.  Previously, patient has refused to let me call the brother during clinic visits.  Patient has been living at home with poor compliance with medical recommendations.  I do not feel the patient should be living alone. I spoke with brother about patient's frailty/tenuous clinical status in light of advanced COPD and terminal lung cancer.  His long term prognosis is poor.  Patient now appears to be actively declining in the setting of acute on chronic respiratory failure.  I again encouraged consideration of DNR/DNI and recommended that brother speak with patient regarding end-of-life decision-making.   Patient expressed understanding and was in agreement with this plan. He also understands that He can call clinic at any time with any questions, concerns, or complaints.   Thank you for allowing me to participate in the care of this very pleasant patient.   Time Total: 25 minutes  Visit consisted of counseling and education dealing with the complex and emotionally intense issues of symptom management  in the setting of serious illness.Greater than 50%  of this time was spent counseling and coordinating care related to the above assessment and plan.  Signed by: Altha Harm, PhD, NP-C

## 2021-09-25 NOTE — ED Notes (Signed)
X-ray at bedside

## 2021-09-25 NOTE — ED Notes (Signed)
MD Jennye Boroughs at bedside discussing care with patients brother

## 2021-09-25 NOTE — ED Notes (Signed)
Patient placed on bipap at this time

## 2021-09-25 NOTE — ED Notes (Signed)
Home oxygen tank returned to pt's brother

## 2021-09-25 NOTE — Death Summary Note (Signed)
DEATH SUMMARY   Patient Details  Name: Derek Clarke MRN: [id number] DOB: [birth year] PCP:Pcp, No  Admission/Discharge Information   Admit Date:  2021/10/03  Date of Death: Date of Death: Oct 03, 2021  Time of Death: Time of Death: Dec 21, 2024  Length of Stay: 1   Principle Cause of death: COPD  Hospital Diagnoses: Principal Problem:   Acute respiratory failure with hypoxia and hypercapnia ([place]) Active Problems:   COPD exacerbation ([place])   Adenocarcinoma of lung, stage 4 ([place])   Chronic respiratory failure with hypoxia ([place])   Dysphagia   Acute on chronic respiratory failure with hypoxia and hypercapnia [facility])   Hospital Course:  Derek Clarke is a 66 y.o. 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  He was referred from the cancer center to the emergency room because of shortness of breath, diaphoresis, grayish discoloration and decreased oxygen saturation. His oxygen saturation was 79% on 6 L/min oxygen via nasal cannula.   He was admitted to the hospital for COPD exacerbation, acute hypoxemic and hypercapnic respiratory failure, acute metabolic encephalopathy and possible acute exacerbation of chronic diastolic CHF.  He was placed on BiPAP for acute respiratory failure.  Initial ABG on presentation showed pH 7.13, PCO2 109 and PO2 of 257 on 100% BiPAP.  He was treated with Lasix, steroids, antibiotics and bronchodilators.  He was not intubated because of DNR status.  His condition deteriorated and his brother requested that patient be transitioned to comfort measures.  He expired on 10/03/21 at 8:26 PM.         The results of significant diagnostics from this hospitalization (including imaging, microbiology, ancillary and laboratory) are listed below for  reference.   Significant Diagnostic Studies: DG Chest Portable 1 View  Result Date: October 03, 2021 CLINICAL DATA:  Hypoxia EXAM: PORTABLE CHEST 1 VIEW COMPARISON:  10/03/2021 at 3:38 p.m. FINDINGS: Consolidation at the right lung base involving the middle lobe and lower lobe. Cannot exclude underlying mass. Low lung volumes. Linear opacities and subtle nodularity at the left lung base, nonspecific. Blunting of the right lateral costophrenic angle favoring right pleural effusion. Right Port-A-Cath tip: Cavoatrial junction. Thoracic spondylosis noted. IMPRESSION: 1. Consolidation at the right lung base involving the lower lobes and middle lobe. Underlying mass not excluded. 2. Slight linear nodular opacities at the left lung base probably from atelectasis. 3. Blunted right costophrenic angle favoring pleural effusion. 4. Low lung volumes. 5. Thoracic spondylosis. Electronically Signed   By: Van Clines M.D.   On: 10/03/21 18:52   DG Chest Portable 1 View  Result Date: 03-Oct-2021 CLINICAL DATA:  Provided history: Shortness of breath. Additional provided: Increased shortness of breath, coughing, congestion. EXAM: PORTABLE CHEST 1 VIEW COMPARISON:  Prior chest radiographs 06/23/2021 and earlier. Chest CT 06/07/2021. FINDINGS: Right chest infusion port catheter with tip projecting at the level of the superior cavoatrial junction. Cardiac monitoring pads overlie the chest. The cardiac silhouette is partially obscured, limiting evaluation of heart size. Left upper lobe airspace disease has become less conspicuous as compared to the prior examination of 06/23/2021. Opacity in the region of the right hilum/right middle lobe has increased in conspicuity. Suspected central pulmonary vascular congestion. Probable small right pleural effusion. No definite left pleural effusion is appreciated. No evidence of pneumothorax. No acute bony abnormality identified. Degenerative changes of the  spine. IMPRESSION: A right  hilar/right middle lobe opacity has increased in conspicuity since the prior examination of 06/16/2021. This may reflect progressive malignancy/post-obstructive atelectasis. Superimposed pneumonia at this site cannot be excluded. A contrast enhanced chest CT may be obtained for further evaluation, as clinically warranted. Left upper lobe consolidation/atelectasis has become less conspicuous. Suspected central pulmonary vascular congestion. Small right pleural effusion. Electronically Signed   By: Kellie Simmering D.O.   On: Sep 17, 2021 15:57    Microbiology: Recent Results (from the past 240 hour(s))  Resp Panel by RT-PCR (Flu A&B, Covid) Nasopharyngeal Swab     Status: None   Collection Time: Sep 17, 2021  2:59 PM   Specimen: Nasopharyngeal Swab; Nasopharyngeal(NP) swabs in vial transport medium  Result Value Ref Range Status   SARS Coronavirus 2 by RT PCR NEGATIVE NEGATIVE Final    Comment: (NOTE) SARS-CoV-2 target nucleic acids are NOT DETECTED.  The SARS-CoV-2 RNA is generally detectable in upper respiratory specimens during the acute phase of infection. The lowest concentration of SARS-CoV-2 viral copies this assay can detect is 138 copies/mL. A negative result does not preclude SARS-Cov-2 infection and should not be used as the sole basis for treatment or other patient management decisions. A negative result may occur with  improper specimen collection/handling, submission of specimen other than nasopharyngeal swab, presence of viral mutation(s) within the areas targeted by this assay, and inadequate number of viral copies(<138 copies/mL). A negative result must be combined with clinical observations, patient history, and epidemiological information. The expected result is Negative.  Fact Sheet for Patients:  EntrepreneurPulse.com.au  Fact Sheet for Healthcare Providers:  IncredibleEmployment.be  This test is no t yet approved or cleared by the [place] FDA and  has been authorized for detection and/or diagnosis of SARS-CoV-2 by FDA under an Emergency Use Authorization (EUA). This EUA will remain  in effect (meaning this test can be used) for the duration of the COVID-19 declaration under Section 564(b)(1) of the Act, 21 U.S.C.section 360bbb-3(b)(1), unless the authorization is terminated  or revoked sooner.       Influenza A by PCR NEGATIVE NEGATIVE Final   Influenza B by PCR NEGATIVE NEGATIVE Final    Comment: (NOTE) The Xpert Xpress SARS-CoV-2/FLU/RSV plus assay is intended as an aid in the diagnosis of influenza from Nasopharyngeal swab specimens and should not be used as a sole basis for treatment. Nasal washings and aspirates are unacceptable for Xpert Xpress SARS-CoV-2/FLU/RSV testing.  Fact Sheet for Patients: EntrepreneurPulse.com.au  Fact Sheet for Healthcare Providers: IncredibleEmployment.be  This test is not yet approved or cleared by the [place] FDA and has been authorized for detection and/or diagnosis of SARS-CoV-2 by FDA under an Emergency Use Authorization (EUA). This EUA will remain in effect (meaning this test can be used) for the duration of the COVID-19 declaration under Section 564(b)(1) of the Act, 21 U.S.C. section 360bbb-3(b)(1), unless the authorization is terminated or revoked.  Performed at [facility], [address] [place], [place] [address]       Signed: Jennye Boroughs September 17, 2021

## 2021-09-25 NOTE — Progress Notes (Signed)
Pt presents to [facility] with c/o worsening respiratory symptoms. Pt reports an increase in shortness of breath, coughing, and congestion since last visit. Pt uses 4L O2 continuous. O2 sats in clinic 82%. Pale color and increased work of breathing observed. [name] Borders, NP, in room to assess pt.   Pt transported to ED, accompanied by Billey Chang, for further assessment/treatment.

## 2021-09-25 NOTE — H&P (Addendum)
History and Physical:    Derek Clarke   QJJ:[id number] DOB: Aug 14, 1956 DOA: 2021/10/10  Referring MD/provider: Harvest Dark, MD PCP: Pcp, No   Patient coming from: Home  Chief Complaint: Shortness of breath  History of Present Illness:   Derek Clarke is a 66 y.o. 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  He was referred from the cancer center to the emergency room because of shortness of breath, diaphoresis, grayish discoloration and decreased oxygen saturation.  His oxygen saturation was 79% on 6 L/min oxygen via nasal cannula.   He is unable to provide any history because of altered mental status.  History was obtained from his brother, Dr. Harvest Dark, ED physician and Ty Hilts, RN at the bedside.    ED Course: Reportedly when patient first arrived in the ED, he was able to speak few sentences.  However, he later became unresponsive.  He was placed on BiPAP because of DNR status.  Mental status improved slightly after he stayed on BiPAP for about an hour. Initial ABG showed pH of 7.13, PCO2 109 and PO2 of 257 on BiPAP with FiO2 of 100%.  Repeat ABG after about an hour on BiPAP showed pH 7.23, PCO2 84, PO2 331 on BiPAP with FiO2 100%.   ROS:   ROS unable to obtain because of lethargy/altered mental status  Past Medical History:   Past Medical History:  Diagnosis Date   Adenocarcinoma ([facility])    lung; stage 4 with mets to liver   Chronic respiratory failure with hypoxia (HCC)    baseline 4 L [place]   COPD (chronic obstructive pulmonary disease) (HCC)    GAD (generalized anxiety disorder)     Past Surgical History:   Past Surgical History:  Procedure Laterality Date   IR GASTROSTOMY TUBE MOD SED  06/28/2021   IR IMAGING GUIDED PORT  INSERTION  06/03/2021    Social History:   Social History   Socioeconomic History   Marital status: Single    Spouse name: Not on file   Number of children: Not on file   Years of education: Not on file   Highest education level: Not on file  Occupational History   Not on file  Tobacco Use   Smoking status: Former    Packs/day: 1.00    Years: 30.00    Pack years: 30.00    Types: Cigarettes    Quit date: 03/26/2021    Years since quitting: 0.4   Smokeless tobacco: Never  Substance and Sexual Activity   Alcohol use: Yes   Drug use: Yes    Types: Marijuana    Comment: quit approx. 5 years ago   Sexual activity: Not on file  Other Topics Concern   Not on file  Social History Narrative   Lives by himself. Brother Frederick "[place] stay with pt. If needed    Social Determinants of Health   Financial Resource Strain: Not on file  Food Insecurity: Not on file  Transportation Needs: Not on file  Physical Activity: Not on file  Stress: Not on file  Social Connections: Not on file  Intimate Partner Violence: Not on file    Allergies   Patient has no known allergies.  Family history:   Family History  Problem Relation Age of Onset  Lung disease Mother    Dementia Father     Current Medications:   Prior to Admission medications   Medication Sig Start Date End Date Taking? Authorizing Provider  albuterol (VENTOLIN HFA) 108 (90 Base) MCG/ACT inhaler Inhale 2 puffs into the lungs every 6 (six) hours as needed for wheezing or shortness of breath. 05/21/21   Lorella Nimrod, MD  ALPRAZolam Duanne Moron) 0.5 MG tablet Take 1 tablet (0.5 mg total) by mouth 2 (two) times daily as needed for anxiety. 08/31/21   Lloyd Huger, MD  busPIRone (BUSPAR) 5 MG tablet Take 1 tablet (5 mg total) by mouth 3 (three) times daily. Patient not taking: Reported on 08/30/2021 06/09/21   Mercy Riding, MD  furosemide (LASIX) 40 MG tablet Place 1 tablet (40 mg total) into feeding tube daily.  07/01/21   Sharen Hones, MD  guaiFENesin (ROBITUSSIN) 100 MG/5ML SOLN Place 10 mLs (200 mg total) into feeding tube every 4 (four) hours as needed for cough or to loosen phlegm. Patient not taking: Reported on 08/30/2021 07/01/21   Sharen Hones, MD  Multiple Vitamin (MULTIVITAMIN WITH MINERALS) TABS tablet Place 1 tablet into feeding tube daily. 07/01/21   Sharen Hones, MD  Nutritional Supplements (FEEDING SUPPLEMENT, OSMOLITE 1.5 CAL,) LIQD Place 237 mLs into feeding tube 6 (six) times daily. 07/01/21   Sharen Hones, MD  Nutritional Supplements (FEEDING SUPPLEMENT, PROSOURCE TF,) liquid Place 45 mLs into feeding tube 3 (three) times daily. 07/01/21   Sharen Hones, MD  osimertinib mesylate (TAGRISSO) 80 MG tablet Take 1 tablet (80 mg total) by mouth daily. 07/07/21   Lloyd Huger, MD  potassium chloride (KLOR-CON) 20 MEQ packet Place 20 mEq into feeding tube daily. 07/01/21 07/31/21  Sharen Hones, MD  Water For Irrigation, Sterile (FREE WATER) SOLN Place 120 mLs into feeding tube 6 (six) times daily. 07/01/21   Sharen Hones, MD    Physical Exam:   Vitals:   15-Sep-2021 1600 15-Sep-2021 1630 2021-09-15 1700 15-Sep-2021 1715  BP: 120/84 114/81 113/71   Pulse: (!) 113 (!) 110 (!) 112 (!) 113  Resp:    (!) 24  SpO2: 99% 99% 98% 100%     Physical Exam: Blood pressure 113/71, pulse (!) 113, resp. rate (!) 24, SpO2 100 %. Gen: Mild respiratory distress Head: Normocephalic, atraumatic. Eyes: Pupils equal, round and reactive to light. Extraocular movements intact.  Sclerae nonicteric.  Mouth: Dry mucous membranes Neck: Supple, no JVD Chest: Diffuse bilateral rhonchi.  No respiratory CV: Heart sounds are regular with an S1, S2, tachycardic.  No murmurs, rubs or gallops.  Abdomen: Soft, nontender, nondistended with normal active bowel sounds. No palpable masses. Extremities: Bilateral leg edema (2+) pedal pulses 2+.  Skin: Warm and dry.  Neuro: Lethargic but arousable.  He does not follow commands. Psych:  Insight is good and judgment is appropriate. Mood and affect normal.   Data Review:    Labs: Basic Metabolic Panel: Recent Labs  Lab Sep 15, 2021 1459  NA 135  K 4.2  CL 94*  CO2 34*  GLUCOSE 204*  BUN 13  CREATININE 0.47*  CALCIUM 9.2   Liver Function Tests: Recent Labs  Lab 2021/09/15 1459  AST 19  ALT 11  ALKPHOS 95  BILITOT 0.9  PROT 7.8  ALBUMIN 3.9   No results for input(s): LIPASE, AMYLASE in the last 168 hours. No results for input(s): AMMONIA in the last 168 hours. CBC: Recent Labs  Lab September 15, 2021 1459  WBC 15.2*  HGB 13.3  HCT  42.9  MCV 91.5  PLT 218   Cardiac Enzymes: No results for input(s): CKTOTAL, CKMB, CKMBINDEX, TROPONINI in the last 168 hours.  BNP (last 3 results) No results for input(s): PROBNP in the last 8760 hours. CBG: No results for input(s): GLUCAP in the last 168 hours.  Urinalysis    Component Value Date/Time   COLORURINE YELLOW (A) 06/16/2021 1622   APPEARANCEUR CLOUDY (A) 06/16/2021 1622   LABSPEC 1.034 (H) 06/16/2021 1622   PHURINE 5.0 06/16/2021 1622   GLUCOSEU NEGATIVE 06/16/2021 1622   HGBUR NEGATIVE 06/16/2021 1622   BILIRUBINUR NEGATIVE 06/16/2021 1622   KETONESUR NEGATIVE 06/16/2021 1622   PROTEINUR NEGATIVE 06/16/2021 1622   NITRITE NEGATIVE 06/16/2021 1622   LEUKOCYTESUR NEGATIVE 06/16/2021 1622      Radiographic Studies: DG Chest Portable 1 View  Result Date: 09-18-21 CLINICAL DATA:  Provided history: Shortness of breath. Additional provided: Increased shortness of breath, coughing, congestion. EXAM: PORTABLE CHEST 1 VIEW COMPARISON:  Prior chest radiographs 06/23/2021 and earlier. Chest CT 06/07/2021. FINDINGS: Right chest infusion port catheter with tip projecting at the level of the superior cavoatrial junction. Cardiac monitoring pads overlie the chest. The cardiac silhouette is partially obscured, limiting evaluation of heart size. Left upper lobe airspace disease has become less conspicuous as compared to  the prior examination of 06/23/2021. Opacity in the region of the right hilum/right middle lobe has increased in conspicuity. Suspected central pulmonary vascular congestion. Probable small right pleural effusion. No definite left pleural effusion is appreciated. No evidence of pneumothorax. No acute bony abnormality identified. Degenerative changes of the spine. IMPRESSION: A right hilar/right middle lobe opacity has increased in conspicuity since the prior examination of 06/16/2021. This may reflect progressive malignancy/post-obstructive atelectasis. Superimposed pneumonia at this site cannot be excluded. A contrast enhanced chest CT may be obtained for further evaluation, as clinically warranted. Left upper lobe consolidation/atelectasis has become less conspicuous. Suspected central pulmonary vascular congestion. Small right pleural effusion. Electronically Signed   By: Kellie Simmering D.O.   On: 2021-09-18 15:57    EKG: EKG has not been done    Assessment/Plan:   Principal Problem:   Acute respiratory failure with hypoxia and hypercapnia (HCC) Active Problems:   COPD exacerbation (HCC)   Adenocarcinoma of lung, stage 4 (HCC)   Chronic respiratory failure with hypoxia (HCC)   Dysphagia   Acute hypoxemic and hypercapnic respiratory failure, chronic hypoxemic respiratory failure: Admit to stepdown unit.  Continue BiPAP and taper off BiPAP as able.  His condition will deteriorate rapidly if BiPAP is discontinued at this time.  He is on 4 L/min oxygen at baseline.  Acute exacerbation of COPD: Treated with IV steroids, empiric IV antibiotics and bronchodilators  Probable acute exacerbation of chronic diastolic CHF: Treat with IV Lasix.  Monitor BMP, daily weight and urine output.  Acute metabolic encephalopathy: Continue supportive care.  Keep n.p.o. for now.  Stage IV lung cancer: He sees Dr. Grayland Ormond, oncologist In the outpatient setting.  He was initiated on Tagrisso on July 07, 2021.  He  was seen by Altha Harm, NP, with palliative care team at the cancer center today.  His brother is contemplating hospice.  Dysphagia: Enteral nutrition via PEG tube will be initiated once respiratory status is stable.     CRITICAL CARE Performed by: Jennye Boroughs   Total critical care time: 60 minutes  Critical care time was exclusive of separately billable procedures and treating other patients.  Critical care was necessary to treat or prevent imminent or  life-threatening deterioration.  Critical care was time spent personally by me on the following activities: development of treatment plan with patient and/or surrogate as well as nursing, discussions with consultants, evaluation of patient's response to treatment, examination of patient, obtaining history from patient or surrogate, ordering and performing treatments and interventions, ordering and review of laboratory studies, ordering and review of radiographic studies, pulse oximetry and re-evaluation of patient's condition.   Other information:   DVT prophylaxis: enoxaparin (LOVENOX) injection 40 mg Start: 09-27-21 1800Lovenox  Code Status: DNR. Family Communication: Jeani Hawking, brother, at the bedside Disposition Plan: Possible discharge to home in 3 to 5 days Consults called: None Admission status: Inpatient     Ellanore Vanhook [facility] Pager: Please check [email or web address]   How to contact the [facility] Attending or Consulting provider [place] or covering provider during after hours [place], for this patient?   Check the care team in [facility] and look for a) attending/consulting [facility] provider listed and b) the [facility] team listed Log into [email or web address] and use [place]'s universal password to access. If you do not have the password, please contact the hospital operator. Locate the [facility] provider you are looking for under [facility] and page to a number that you can be directly reached. If you still have difficulty reaching  the provider, please page the [facility] (Director on Call) for the Hospitalists listed on amion for assistance.  Sep 27, 2021, 5:55 PM

## 2021-09-25 NOTE — ED Notes (Signed)
Pt non responsive to deep sternal rub at this time- pt taken off bipap doctors notified. Pt comfortable with family in room at this time.

## 2021-09-25 NOTE — ED Notes (Signed)
[phone] [facility] referral number for organ tissue donation.

## 2021-09-25 NOTE — ED Notes (Signed)
Pt passed at 2026 on September 21, 2021 , doubled verified death with Makenli Derstine A Rn and Geophysical data processor.

## 2021-09-25 NOTE — ED Notes (Signed)
Brother is at bedside deciding whether to intubate or not

## 2021-09-25 NOTE — ED Notes (Signed)
Patient stopped responding to staff. MD made aware. Paduchowski made aware and at bedside. Patient being bagged at this time.

## 2021-09-25 NOTE — ED Triage Notes (Signed)
Pt brought from the cancer center, for resp distress. Pt is grey diaphoretic, tachapneic, you can hear rhonchi and crackles without a stethoscope, only able to speak in a 1 to 2 word sentences. He was 79% on 6L , placed pt on NRB is currently 93%

## 2021-09-25 NOTE — Progress Notes (Signed)
Cross Cover Per Dr Nelva Bush, patient had become less responsive and increase struggling with BIPAP. Discussion between him and brother at bedside, treatment focus to comfort care. Orders placed. Discussed care with brother at bedside. Provided support. Chaplain services offered

## 2021-09-25 NOTE — ED Provider Notes (Addendum)
[facility] Emergency Department Provider Note  Time seen: 3:27 PM  I have reviewed the triage vital signs and the nursing notes.   HISTORY  Chief Complaint Respiratory Distress   HPI Derek Clarke is a 66 y.o. male with a past medical history of respiratory failure, stage IV lung cancer, COPD, presents to the emergency department for difficulty breathing.  Patient was brought over from the cancer center for respiratory distress patient is gray/pale appearing diaphoretic tachypneic with audible rhonchi.  Able to initially speak in 1-2 word sentences by the time he was brought back to the room patient is no longer able to communicate 79% on 6 L placed on nonrebreather satting in the low 90s currently on nonrebreather.  Appears somnolent/fatigued unable to answer questions or follow commands.  Remains diaphoretic.   Past Medical History:  Diagnosis Date   Adenocarcinoma ([place])    lung; stage 4 with mets to liver   Chronic respiratory failure with hypoxia (HCC)    baseline 4 L [place]   COPD (chronic obstructive pulmonary disease) (HCC)    GAD (generalized anxiety disorder)     Patient Active Problem List   Diagnosis Date Noted   Hyponatremia 06/29/2021   Hypokalemia    Chronic diastolic CHF (congestive heart failure) (HCC)    Thrombocytopenia (HCC)    Moderate malnutrition ([place]) 06/22/2021   SOB (shortness of breath) 06/13/2021   Elevated troponin 06/13/2021   Dysphagia 06/13/2021   GAD (generalized anxiety disorder) 06/13/2021   Palliative care encounter    Acute on chronic respiratory failure with hypoxia ([place]) 06/07/2021   Chronic respiratory failure with hypoxia ([place]) 06/07/2021   COPD (chronic obstructive pulmonary disease) (HCC)    Severe sepsis (HCC)    Anxiety    COPD exacerbation ([place])    Adenocarcinoma of left lung ([place]) 05/26/2021   Hemoptysis 05/15/2021   Adenocarcinoma of lung, stage 4 ([place]) 05/15/2021   Acute respiratory failure with  hypoxia ([place]) 05/15/2021   Nicotine dependence 05/15/2021    Past Surgical History:  Procedure Laterality Date   IR GASTROSTOMY TUBE MOD SED  06/28/2021   IR IMAGING GUIDED PORT INSERTION  06/03/2021    Prior to Admission medications   Medication Sig Start Date End Date Taking? Authorizing Provider  albuterol (VENTOLIN HFA) 108 (90 Base) MCG/ACT inhaler Inhale 2 puffs into the lungs every 6 (six) hours as needed for wheezing or shortness of breath. 05/21/21   Lorella Nimrod, MD  ALPRAZolam Duanne Moron) 0.5 MG tablet Take 1 tablet (0.5 mg total) by mouth 2 (two) times daily as needed for anxiety. 08/31/21   Lloyd Huger, MD  busPIRone (BUSPAR) 5 MG tablet Take 1 tablet (5 mg total) by mouth 3 (three) times daily. Patient not taking: Reported on 08/30/2021 06/09/21   Mercy Riding, MD  furosemide (LASIX) 40 MG tablet Place 1 tablet (40 mg total) into feeding tube daily. 07/01/21   Sharen Hones, MD  guaiFENesin (ROBITUSSIN) 100 MG/5ML SOLN Place 10 mLs (200 mg total) into feeding tube every 4 (four) hours as needed for cough or to loosen phlegm. Patient not taking: Reported on 08/30/2021 07/01/21   Sharen Hones, MD  Multiple Vitamin (MULTIVITAMIN WITH MINERALS) TABS tablet Place 1 tablet into feeding tube daily. 07/01/21   Sharen Hones, MD  Nutritional Supplements (FEEDING SUPPLEMENT, OSMOLITE 1.5 CAL,) LIQD Place 237 mLs into feeding tube 6 (six) times daily. 07/01/21   Sharen Hones, MD  Nutritional Supplements (FEEDING SUPPLEMENT, PROSOURCE TF,) liquid Place 45  mLs into feeding tube 3 (three) times daily. 07/01/21   Sharen Hones, MD  osimertinib mesylate (TAGRISSO) 80 MG tablet Take 1 tablet (80 mg total) by mouth daily. 07/07/21   Lloyd Huger, MD  potassium chloride (KLOR-CON) 20 MEQ packet Place 20 mEq into feeding tube daily. 07/01/21 07/31/21  Sharen Hones, MD  Water For Irrigation, Sterile (FREE WATER) SOLN Place 120 mLs into feeding tube 6 (six) times daily. 07/01/21   Sharen Hones, MD    No  Known Allergies  Family History  Problem Relation Age of Onset   Lung disease Mother    Dementia Father     Social History Social History   Tobacco Use   Smoking status: Former    Packs/day: 1.00    Years: 30.00    Pack years: 30.00    Types: Cigarettes    Quit date: 03/26/2021    Years since quitting: 0.4   Smokeless tobacco: Never  Substance Use Topics   Alcohol use: Yes   Drug use: Yes    Types: Marijuana    Comment: quit approx. 5 years ago    Review of Systems Unable to obtain adequate/accurate review of systems secondary to altered mental status/respiratory distress. ____________________________________________   PHYSICAL EXAM:  VITAL SIGNS: ED Triage Vitals  Enc Vitals Group     BP 2021/09/19 1506 (!) 144/117     Pulse Rate 09-19-21 1506 (!) 124     Resp 2021/09/19 1506 (!) 21     Temp --      Temp src --      SpO2 September 19, 2021 1503 (!) 9 %     Weight --      Height --      Head Circumference --      Peak Flow --      Pain Score --      Pain Loc --      Pain Edu? --      Excl. in [place]? --    Constitutional: Patient is diaphoretic pale appearing audible rhonchi.  Patient is tachypneic on a nonrebreather, unable to respond to my questions unable to answer simple yes/no questions unable to follow even simple commands such as squeezing my fingers.  Patient is thin and cachectic appearing. Eyes: Normal exam 2 to 3 mm bilaterally. ENT      Head: Normocephalic and atraumatic.      Mouth/Throat: Mucous membranes are moist. Cardiovascular: Irregular rhythm rate around 120. Respiratory: Mildly tachypneic audible rhonchi bilaterally.  Significant respiratory distress Gastrointestinal: Soft and nontender. No distention.  G-tube present. Musculoskeletal: Significant lower extremity edema bilaterally Neurologic: Very somnolent/fatigued appearing unable to speak. Skin:  Skin is warm, dry and intact.  Psychiatric: Altered/minimally responsive but  awake.  ____________________________________________    EKG  EKG viewed and interpreted by myself shows sinus tachycardia 123 bpm with a widened QRS, normal axis, largely normal intervals with nonspecific ST changes.  ____________________________________________    RADIOLOGY  Chest x-ray shows right middle lobe opacity has increased since September 2022 superimposed pneumonia cannot be ruled out.  Left upper lobe consolidation has improved positive for vascular congestion.  ____________________________________________   INITIAL IMPRESSION / ASSESSMENT AND PLAN / ED COURSE  Pertinent labs & imaging results that were available during my care of the patient were reviewed by me and considered in my medical decision making (see chart for details).   Patient presents to the emergency department brought from the cancer center for acute and significant respiratory distress.  Patient  arrives pale, diaphoretic significant respiratory distress.  Triage nurse states initially was able to speak in 1-2 word sentences he is no longer able to speak or follow commands or shake his head in any way.  Brother is here with the patient who states the patient did not want to be intubated.  I spoke to the brother regarding the patient's grim prognosis and how this could very likely be a terminal event.  We will attempt BiPAP check labs, chest x-ray, ABG.  Differential is quite broad but would include CHF/pulmonary edema, hypercarbia, pneumonia/pneumothorax, pleural effusions, ACS, pulmonary embolism.  Patient remains on BiPAP.  Is mostly asleep however now will open his eyes when you call his name.  We will obtain a repeat ABG at 430 to see if the PCO2 is improving.  Initial PCO2 of 109, pH 7.13.  Repeat ABG shows PCO2 down to 84.  Patient is appearing much better.  He now open his eyes when I called his name and asked to sit up.  We will admit to the hospital service.  I confirm with the brother DNR  status.   Patient was admitted to the hospital service however he is oxygen level is now dropping.  Repeat chest x-ray does not appear to show any pneumothorax but does show what appears to be worsening pulmonary edema on my evaluation.  Patient's pulse ox now down to 77% placed on 100% on BiPAP.  Patient's pulse ox initially increased to approximately 85% and is now around 85 and at times trending down.  Patient is much more uncomfortable he is now unresponsive to sternal rub with agonal type respirations.  I spoke to the admitting hospitalist, spoke to the brother at length.  Brother wishes to proceed with comfort measures.  Patient appears to be acutely decompensating in the emergency department.  Given his chronic medical issues patient has a very grim prognosis even if he was able to be resuscitated.  Brother confirms DNR/DNI does not want life support.  Wishes to proceed with comfort measures.  We will dose morphine, Ativan if needed, we will likely remove from BiPAP shortly.  Brother agreeable to plan of care.  Hospitalist has arrived and is taking over palliative/comfort orders.  Derek Clarke was evaluated in Emergency Department on 2021-09-18 for the symptoms described in the history of present illness. He was evaluated in the context of the global COVID-19 pandemic, which necessitated consideration that the patient might be at risk for infection with the SARS-CoV-2 virus that causes COVID-19. Institutional protocols and algorithms that pertain to the evaluation of patients at risk for COVID-19 are in a state of rapid change based on information released by regulatory bodies including the CDC and federal and state organizations. These policies and algorithms were followed during the patient's care in the ED.  CRITICAL CARE Performed by: Harvest Dark   Total critical care time: 60 minutes  Critical care time was exclusive of separately billable procedures and treating other  patients.  Critical care was necessary to treat or prevent imminent or life-threatening deterioration.  Critical care was time spent personally by me on the following activities: development of treatment plan with patient and/or surrogate as well as nursing, discussions with consultants, evaluation of patient's response to treatment, examination of patient, obtaining history from patient or surrogate, ordering and performing treatments and interventions, ordering and review of laboratory studies, ordering and review of radiographic studies, pulse oximetry and re-evaluation of patient's condition.  ____________________________________________   FINAL CLINICAL IMPRESSION(S) /  ED DIAGNOSES  Dyspnea Respiratory distress Hypercarbia   Harvest Dark, MD 2021/10/08 [id number]    Harvest Dark, MD 10-08-21 [place]    Harvest Dark, MD 08-Oct-2021 [id number]

## 2021-09-25 DEATH — deceased

## 2021-09-27 ENCOUNTER — Encounter: Payer: Self-pay | Admitting: Hospice and Palliative Medicine

## 2021-09-27 ENCOUNTER — Ambulatory Visit: Payer: Self-pay | Admitting: Pharmacist

## 2021-09-27 ENCOUNTER — Ambulatory Visit: Payer: Self-pay | Admitting: Oncology

## 2021-09-27 ENCOUNTER — Ambulatory Visit: Payer: Self-pay

## 2021-09-27 ENCOUNTER — Other Ambulatory Visit: Payer: Self-pay

## 2021-10-21 ENCOUNTER — Ambulatory Visit: Payer: Self-pay | Admitting: Cardiology

## 2022-09-12 IMAGING — CT CT ANGIO CHEST
2 of 6 series · 17 of 46 positions shown · IV contrast (APPLIED)
Comparison: None.

CLINICAL DATA: Hemoptysis

EXAM:
CT ANGIOGRAPHY CHEST WITH CONTRAST
TECHNIQUE: Multidetector CT imaging of the chest was performed using the
standard protocol during bolus administration of intravenous
contrast. Multiplanar CT image reconstructions and MIPs were
obtained to evaluate the vascular anatomy.
CONTRAST:  100mL OMNIPAQUE IOHEXOL 350 MG/ML SOLN

[Series 5: thins · axial · 0.96mm/px · z∈[-688,-400]mm · 14 of 395 slices shown]
[im 17/395  lung]
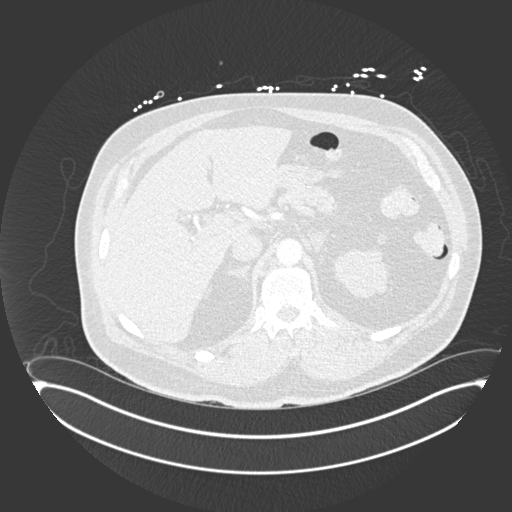
[im 50/395  soft-tissue]
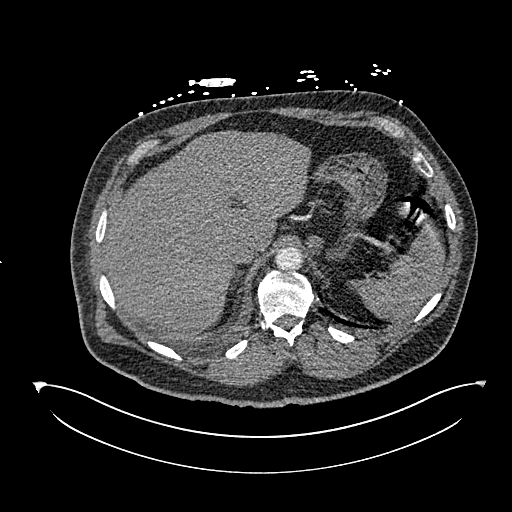
[im 83/395  lung]
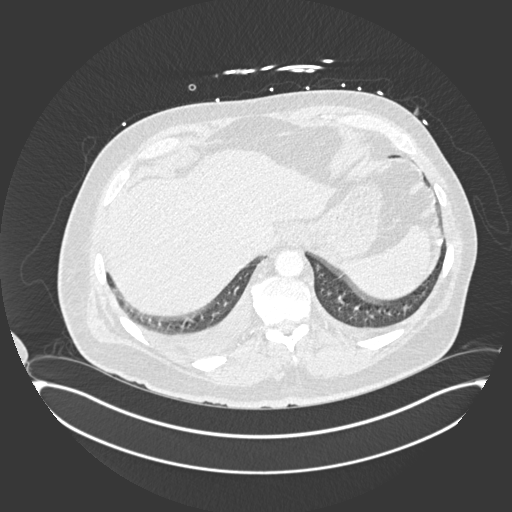
[im 99/395  soft-tissue]
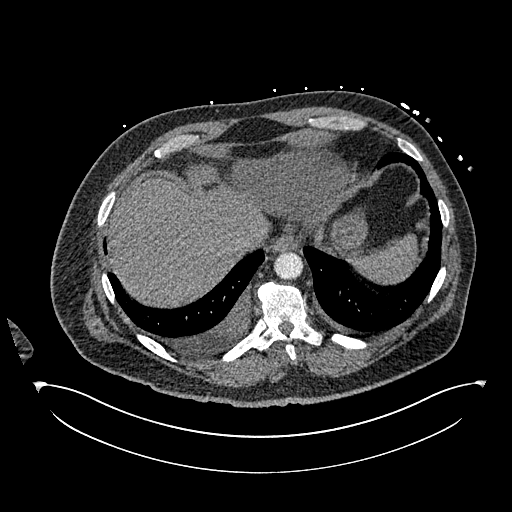
[im 132/395  lung]
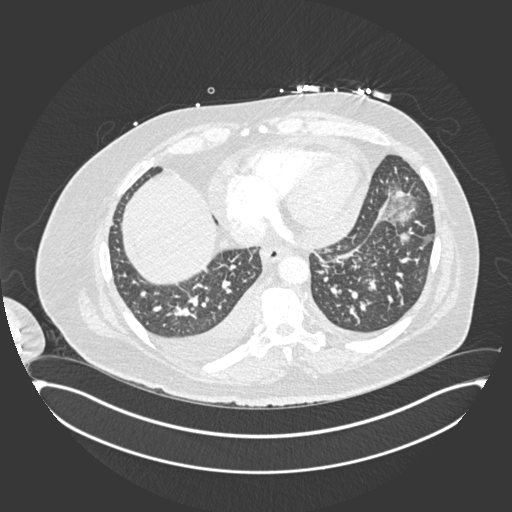
[im 165/395  soft-tissue]
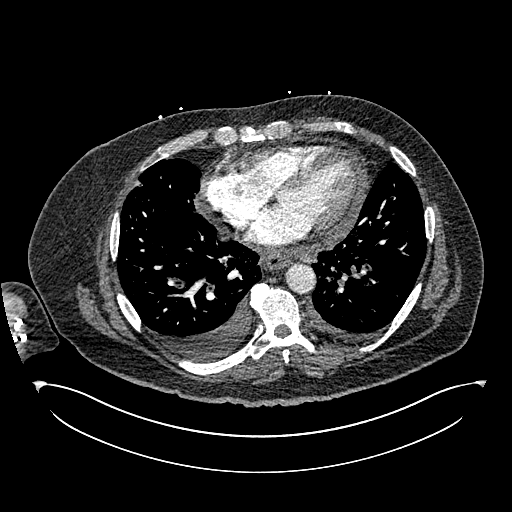
[im 181/395  lung]
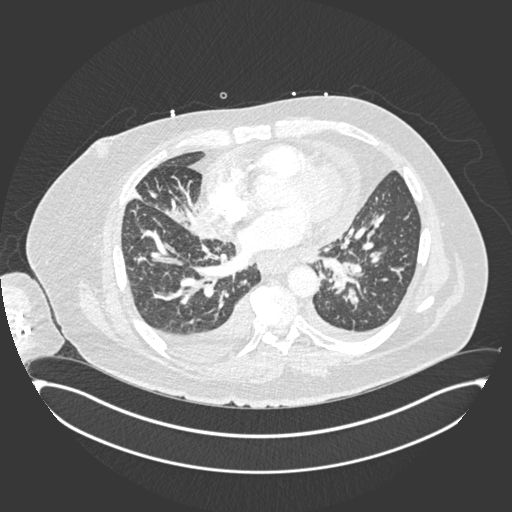
[im 214/395  soft-tissue]
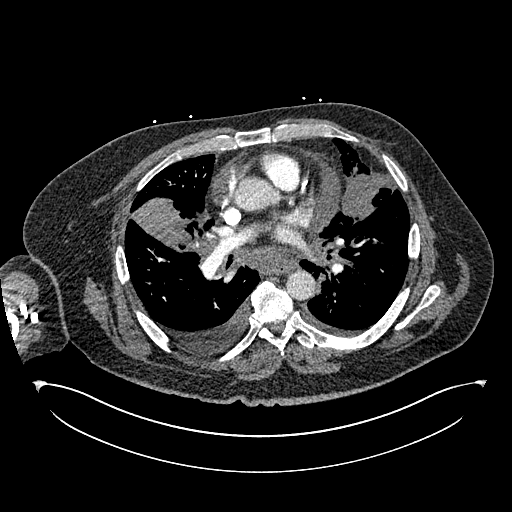
[im 230/395  lung]
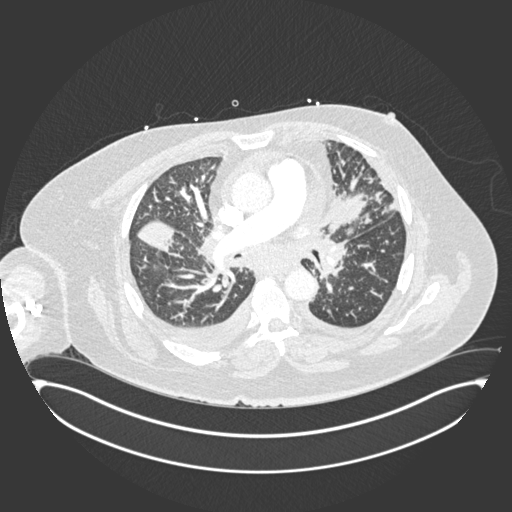
[im 263/395  soft-tissue]
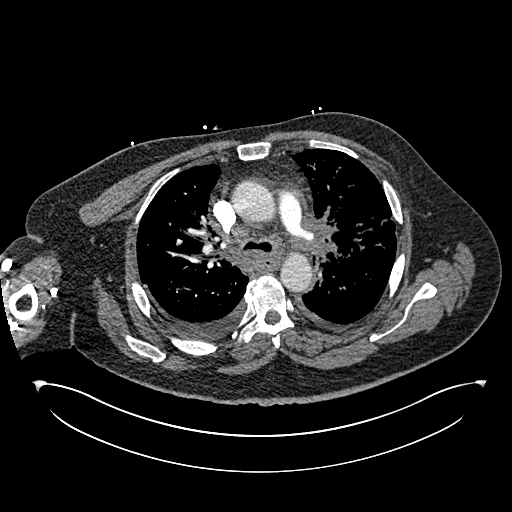
[im 296/395  lung]
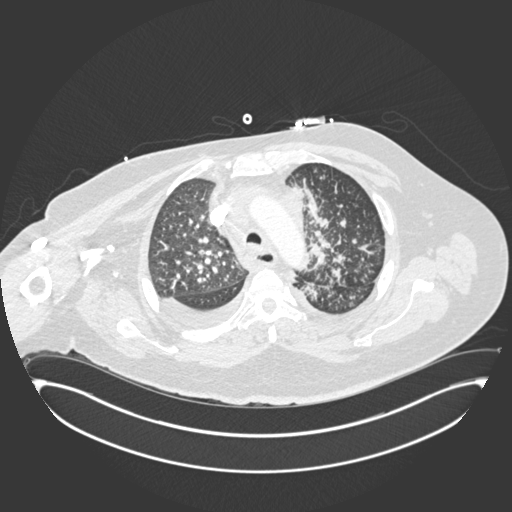
[im 312/395  soft-tissue]
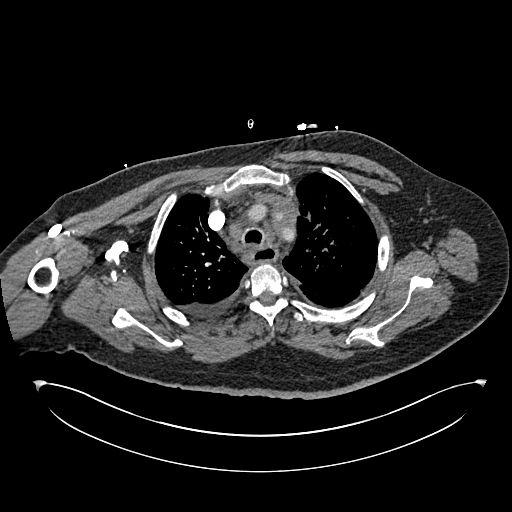
[im 345/395  lung]
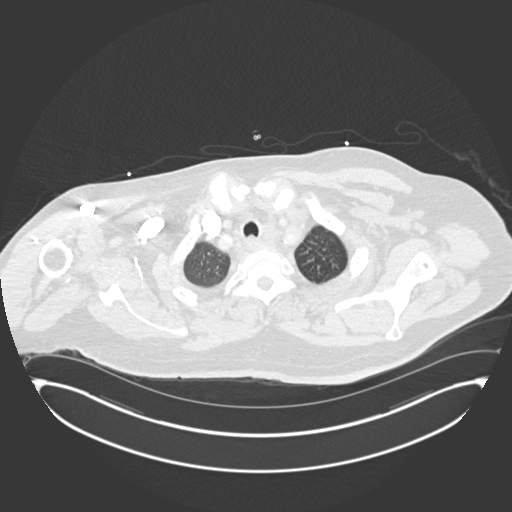
[im 378/395  soft-tissue]
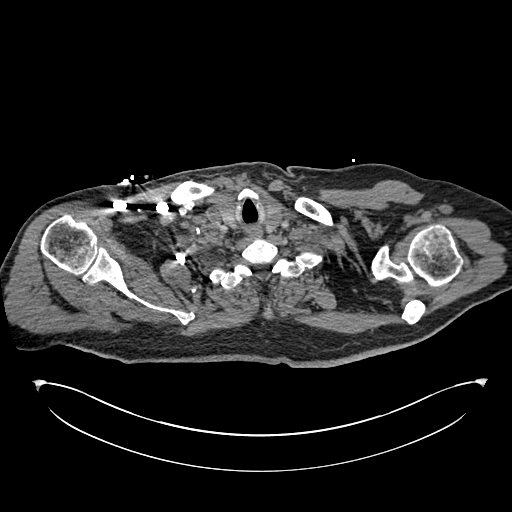

[Series 7: coronal mpr · coronal · 0.62mm/px · 3 of 106 slices shown]
[im 27/106  soft-tissue]
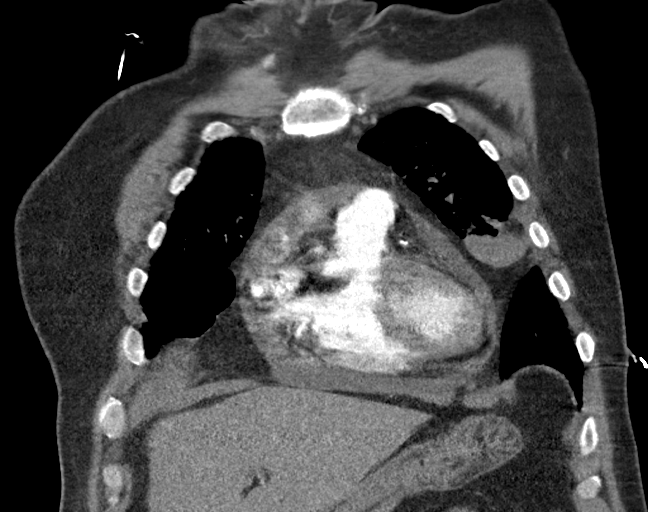
[im 53/106  soft-tissue]
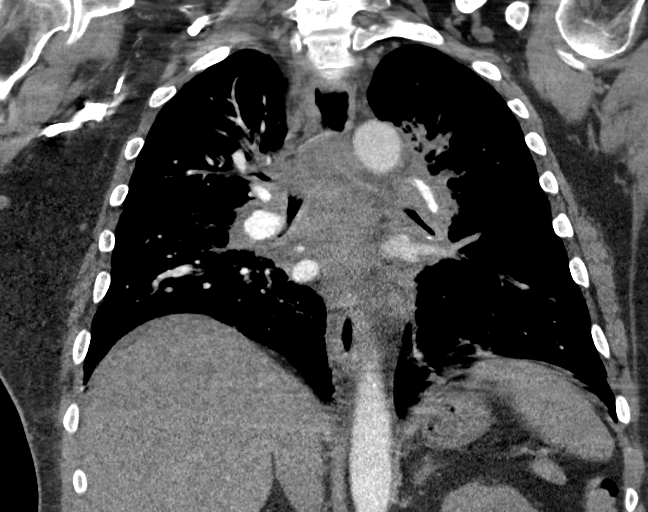
[im 79/106  soft-tissue]
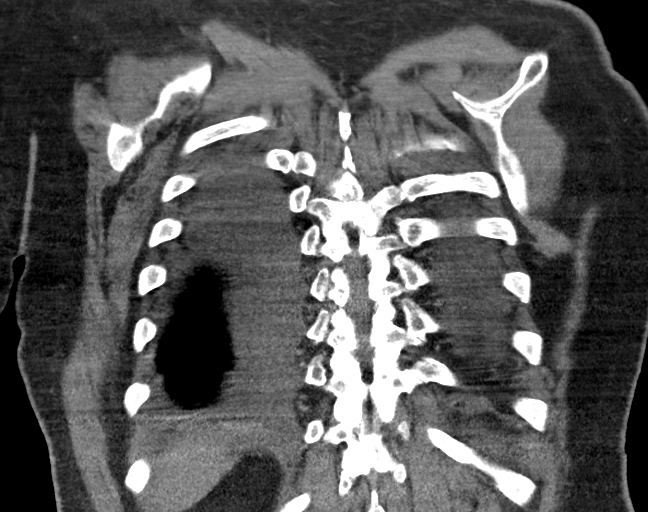

[17 of 46 positions shown; findings below may reference images not displayed]

FINDINGS: Cardiovascular: Contrast injection is sufficient to demonstrate
satisfactory opacification of the pulmonary arteries to the
segmental level. There is no pulmonary embolus or evidence of right
heart strain. The size of the main pulmonary artery is normal.
Proximal left pulmonary arteries are severely narrowed by confluent
soft tissue surrounding the left hilum. The course and caliber of
the aorta are normal. There is atherosclerotic calcification.
Opacification decreased due to pulmonary arterial phase contrast
bolus timing. There is a small pericardial effusion.

Mediastinum/Nodes: There is bulky abnormal mediastinal soft tissue
with in case mint of the left common carotid artery and proximal
left subclavian artery. There are multiple enlarged mediastinal
lymph nodes. Level 4 are node measures 10 mm.

Lungs/Pleura: There is confluent soft tissue at the left hilum
encasing the main left pulmonary artery and severely narrowing the
proximal lobar branches. Abnormal soft tissue extends in a
peribronchial pattern. There is also abnormal soft tissue at the
right hilum. There is atelectasis within the right middle lobe and
an area of consolidation in the lingula. Small pleural effusions.

Upper Abdomen: Contrast bolus timing is not optimized for evaluation
of the abdominal organs. There is an intermediate density 2.0 x
cm right adrenal nodule. There is a lower density left adrenal
nodule measuring 1.5 cm.

Musculoskeletal: No chest wall abnormality. No bony spinal canal
stenosis.

Review of the MIP images confirms the above findings.
IMPRESSION: 1. No pulmonary embolus or acute aortic syndrome.
2. Bulky abnormal soft tissue at the left hilum encasing the main
left pulmonary artery and severely narrowing the proximal lobar
branches, consistent with malignancy.
3. Multiple enlarged mediastinal lymph nodes, consistent with
lymphatic metastatic disease.
4. Bilateral adrenal nodules, measuring up to 2.0 x 1.3 cm. The
right adrenal nodule is concerning for metastatic disease. The left
may be an adenoma.
5. Small pericardial effusion and small pleural effusions.
6. Consolidation in the lingula may be postobstructive.

Aortic atherosclerosis ([id number].R).
